# Patient Record
Sex: Male | Born: 1950
Health system: Southern US, Community
[De-identification: ages and names within clinical notes are randomized; demographics above are authoritative.]

## PROBLEM LIST (undated history)

## (undated) DIAGNOSIS — D75A Glucose-6-phosphate dehydrogenase (G6PD) deficiency without anemia: Secondary | ICD-10-CM

## (undated) DIAGNOSIS — N390 Urinary tract infection, site not specified: Secondary | ICD-10-CM

## (undated) DIAGNOSIS — I10 Essential (primary) hypertension: Secondary | ICD-10-CM

## (undated) DIAGNOSIS — E785 Hyperlipidemia, unspecified: Secondary | ICD-10-CM

## (undated) HISTORY — DX: Hyperlipidemia, unspecified: E78.5

## (undated) HISTORY — DX: Essential (primary) hypertension: I10

## (undated) HISTORY — DX: Urinary tract infection, site not specified: N39.0

## (undated) HISTORY — PX: URETHRA SURGERY: SHX824

---

## 2000-01-11 ENCOUNTER — Encounter: Payer: Self-pay | Admitting: Urology

## 2000-01-12 ENCOUNTER — Encounter (INDEPENDENT_AMBULATORY_CARE_PROVIDER_SITE_OTHER): Payer: Self-pay | Admitting: Specialist

## 2000-01-12 ENCOUNTER — Ambulatory Visit (HOSPITAL_COMMUNITY): Admission: RE | Admit: 2000-01-12 | Discharge: 2000-01-12 | Payer: Self-pay | Admitting: Urology

## 2005-04-19 ENCOUNTER — Ambulatory Visit: Payer: Self-pay | Admitting: Internal Medicine

## 2005-05-04 ENCOUNTER — Ambulatory Visit: Payer: Self-pay | Admitting: Internal Medicine

## 2009-02-03 ENCOUNTER — Encounter: Admission: RE | Admit: 2009-02-03 | Discharge: 2009-02-03 | Payer: Self-pay | Admitting: Family Medicine

## 2010-12-23 NOTE — Op Note (Signed)
[facility]  Patient:    Cameron Rice, Cameron Rice                     MRN: [id number] Proc. Date: 01/12/00 Adm. Date:  [id number] Disc. Date: [id number] Attending:  Katherine Roan CC:         Talmadge Coventry, M.D.                           Operative Report  PREOPERATIVE DIAGNOSIS:  Right testicular nodule.  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  OPERATION:  Right scrotal exploration of testis with biopsy.  ANESTHESIA:  General.  SURGEON: Rozanna Boer., M.D.  BRIEF HISTORY:  This 60 year old black male was found two days ago on routine physical to have a nodule of his right testis.  It is not painful or tender. Does not remember any history of injury.  This was confirmed in my office yesterday.  Ultrasound seemed to show the nodule, but the underlying testicular tissue seemed to be homogeneous and normal.  He is admitted now for scrotal exploration of this lesion.  Serum tumor markers were drawn preoperatively.  DESCRIPTION OF PROCEDURE:  The patient was placed on the operating table in supine position.  After satisfactory induction of general endotracheal anesthesia, was shaved, prepped and draped in the usual sterile fashion.  A right groin incision was made, carried down through the subcutaneous tissue to expose the external oblique fascia.  The fascia was opened at the external ring, taking care not to injure the ilioinguinal nerve.  A Penrose drain was placed around the cord, and the testis was delivered into the operative field. The tunica vaginalis was opened and the testis explored.  The nodule was about 2.5 cm and seemed to be on the tunica albuginea just near the lateral aspect of the midportion of the testis near the epididymis but arising from the tunica albuginea of the testis.  This nodule was excised.  It was sent for permanent section.  It was obviously not a tumor.   When hemostasis was achieved, the testis was placed back in the right hemiscrotal compartment with a pexing suture of 3-0 chromic.  The external oblique fascia was then closed with interrupted 2-0 silk pop-offs _____, and the skin with a running subcuticular closure with 4-0 Vicryl and Steri-Strips.  A sterile dry dressing was applied. He was given 30 mg of Toradol IV and sent to the recovery room in good condition.  Sponge, instrument, and needle counts were correct.  Blood loss was minimal.  The patient will be later discharged as an outpatient with detailed written instructions. DD:  01/12/00 TD:  01/16/00 Job: [id number]

## 2011-09-06 DIAGNOSIS — E7800 Pure hypercholesterolemia, unspecified: Secondary | ICD-10-CM | POA: Insufficient documentation

## 2011-09-06 DIAGNOSIS — N400 Enlarged prostate without lower urinary tract symptoms: Secondary | ICD-10-CM | POA: Insufficient documentation

## 2011-09-06 DIAGNOSIS — I10 Essential (primary) hypertension: Secondary | ICD-10-CM | POA: Insufficient documentation

## 2011-09-06 DIAGNOSIS — E291 Testicular hypofunction: Secondary | ICD-10-CM | POA: Insufficient documentation

## 2012-05-24 ENCOUNTER — Encounter: Payer: Self-pay | Admitting: Internal Medicine

## 2012-12-04 ENCOUNTER — Encounter: Payer: Self-pay | Admitting: Internal Medicine

## 2013-09-04 DIAGNOSIS — G4726 Circadian rhythm sleep disorder, shift work type: Secondary | ICD-10-CM | POA: Insufficient documentation

## 2015-11-30 DIAGNOSIS — Z23 Encounter for immunization: Secondary | ICD-10-CM | POA: Diagnosis not present

## 2015-11-30 DIAGNOSIS — N3000 Acute cystitis without hematuria: Secondary | ICD-10-CM | POA: Diagnosis not present

## 2015-11-30 DIAGNOSIS — R8299 Other abnormal findings in urine: Secondary | ICD-10-CM | POA: Diagnosis not present

## 2015-11-30 DIAGNOSIS — E785 Hyperlipidemia, unspecified: Secondary | ICD-10-CM | POA: Diagnosis not present

## 2015-11-30 DIAGNOSIS — I10 Essential (primary) hypertension: Secondary | ICD-10-CM | POA: Diagnosis not present

## 2015-11-30 DIAGNOSIS — Z0000 Encounter for general adult medical examination without abnormal findings: Secondary | ICD-10-CM | POA: Diagnosis not present

## 2016-02-09 DIAGNOSIS — D122 Benign neoplasm of ascending colon: Secondary | ICD-10-CM | POA: Diagnosis not present

## 2016-02-09 DIAGNOSIS — Z1211 Encounter for screening for malignant neoplasm of colon: Secondary | ICD-10-CM | POA: Diagnosis not present

## 2016-03-06 DIAGNOSIS — N400 Enlarged prostate without lower urinary tract symptoms: Secondary | ICD-10-CM | POA: Diagnosis not present

## 2016-05-29 DIAGNOSIS — I10 Essential (primary) hypertension: Secondary | ICD-10-CM | POA: Diagnosis not present

## 2016-05-29 DIAGNOSIS — E7800 Pure hypercholesterolemia, unspecified: Secondary | ICD-10-CM | POA: Diagnosis not present

## 2016-09-22 DIAGNOSIS — N400 Enlarged prostate without lower urinary tract symptoms: Secondary | ICD-10-CM | POA: Diagnosis not present

## 2016-09-22 DIAGNOSIS — I10 Essential (primary) hypertension: Secondary | ICD-10-CM | POA: Diagnosis not present

## 2016-11-27 DIAGNOSIS — Z0000 Encounter for general adult medical examination without abnormal findings: Secondary | ICD-10-CM | POA: Diagnosis not present

## 2016-11-27 DIAGNOSIS — Z1159 Encounter for screening for other viral diseases: Secondary | ICD-10-CM | POA: Diagnosis not present

## 2016-11-27 DIAGNOSIS — I10 Essential (primary) hypertension: Secondary | ICD-10-CM | POA: Diagnosis not present

## 2016-11-27 DIAGNOSIS — E7800 Pure hypercholesterolemia, unspecified: Secondary | ICD-10-CM | POA: Diagnosis not present

## 2016-12-21 DIAGNOSIS — I10 Essential (primary) hypertension: Secondary | ICD-10-CM | POA: Diagnosis not present

## 2016-12-21 DIAGNOSIS — S46211A Strain of muscle, fascia and tendon of other parts of biceps, right arm, initial encounter: Secondary | ICD-10-CM | POA: Diagnosis not present

## 2017-01-22 DIAGNOSIS — R300 Dysuria: Secondary | ICD-10-CM | POA: Diagnosis not present

## 2017-01-22 DIAGNOSIS — I10 Essential (primary) hypertension: Secondary | ICD-10-CM | POA: Diagnosis not present

## 2017-06-18 DIAGNOSIS — R350 Frequency of micturition: Secondary | ICD-10-CM | POA: Diagnosis not present

## 2017-06-18 DIAGNOSIS — R82998 Other abnormal findings in urine: Secondary | ICD-10-CM | POA: Diagnosis not present

## 2017-06-18 DIAGNOSIS — N390 Urinary tract infection, site not specified: Secondary | ICD-10-CM | POA: Diagnosis not present

## 2017-06-18 DIAGNOSIS — I10 Essential (primary) hypertension: Secondary | ICD-10-CM | POA: Diagnosis not present

## 2017-06-18 DIAGNOSIS — R319 Hematuria, unspecified: Secondary | ICD-10-CM | POA: Diagnosis not present

## 2017-06-18 DIAGNOSIS — E7800 Pure hypercholesterolemia, unspecified: Secondary | ICD-10-CM | POA: Diagnosis not present

## 2017-08-29 ENCOUNTER — Encounter: Payer: Self-pay | Admitting: Nurse Practitioner

## 2017-08-29 ENCOUNTER — Ambulatory Visit (INDEPENDENT_AMBULATORY_CARE_PROVIDER_SITE_OTHER): Payer: BLUE CROSS/BLUE SHIELD | Admitting: Nurse Practitioner

## 2017-08-29 VITALS — BP 138/84 | HR 82 | Temp 97.6°F | Ht 71.0 in | Wt 190.0 lb

## 2017-08-29 DIAGNOSIS — N529 Male erectile dysfunction, unspecified: Secondary | ICD-10-CM | POA: Diagnosis not present

## 2017-08-29 DIAGNOSIS — N401 Enlarged prostate with lower urinary tract symptoms: Secondary | ICD-10-CM

## 2017-08-29 DIAGNOSIS — N521 Erectile dysfunction due to diseases classified elsewhere: Secondary | ICD-10-CM | POA: Insufficient documentation

## 2017-08-29 DIAGNOSIS — N390 Urinary tract infection, site not specified: Secondary | ICD-10-CM | POA: Insufficient documentation

## 2017-08-29 DIAGNOSIS — I10 Essential (primary) hypertension: Secondary | ICD-10-CM

## 2017-08-29 DIAGNOSIS — R350 Frequency of micturition: Secondary | ICD-10-CM | POA: Diagnosis not present

## 2017-08-29 LAB — POCT URINALYSIS DIPSTICK
Bilirubin, UA: NEGATIVE
GLUCOSE UA: NEGATIVE
Ketones, UA: NEGATIVE
Nitrite, UA: POSITIVE
Protein, UA: NEGATIVE
RBC UA: NEGATIVE
Spec Grav, UA: 1.015 (ref 1.010–1.025)
Urobilinogen, UA: 0.2 E.U./dL
pH, UA: 7 (ref 5.0–8.0)

## 2017-08-29 MED ORDER — CIPROFLOXACIN HCL 500 MG PO TABS: 500.0000 mg | ORAL_TABLET | Freq: Two times a day (BID) | ORAL | 0 refills | Status: DC

## 2017-08-29 NOTE — Progress Notes (Signed)
Subjective:  Patient ID: Cameron Rice, male    DOB: 1950/08/12  Age: 67 y.o. MRN: [id number]  CC: Establish Care (est care) and Urinary Tract Infection (SPQ:[id number] urinate,strong odor/get this alot/had surgery for this but not helping as much/Dr. Kathyrn Lass from [facility]/see doc every 2-3 mo for abx)  Urinary Frequency   This is a recurrent problem. The current episode started more than 1 year ago (onset 52yrs ago). The problem occurs every urination. The problem has been waxing and waning. The quality of the pain is described as burning. There has been no fever. He is sexually active. There is no history of pyelonephritis. Associated symptoms include frequency, hesitancy and urgency. Pertinent negatives include no chills, discharge, flank pain, hematuria, nausea, possible pregnancy, sweats or vomiting. He has tried increased fluids for the symptoms. The treatment provided no relief. His past medical history is significant for recurrent UTIs, urinary stasis and a urological procedure. There is no history of catheterization, kidney stones or a single kidney.  cystocopy done 08/2015 Avoids  Soda, caffeine and artificial sweetners. Denies any constipation.  Last UTI treatment 06/2017, treated with bactrim x 10days. Urine culture was positive foe klebsiella pneumoniae.  Will like referral to another urology group. Last urologist was Dr. Kathyrn Lass who has now retired.  HTN: Controlled with Lotrel. Maintain DASH diet and regular exercise  Previous pcp: Dr. Lowry Bowl with [facility]. Last CPE 11/27/2016. Labs last done 06/2017: stable (reviewed CMP, lipid panel, PSA).  Outpatient Medications Prior to Visit  Medication Sig Dispense Refill  . amLODipine-benazepril (LOTREL) 10-20 MG capsule Take by mouth.    Marland Kitchen atorvastatin (LIPITOR) 40 MG tablet TAKE 1 TABLET DAILY    . Multiple Vitamin (MULTIVITAMIN) tablet Take by mouth.    . Multiple Vitamins-Minerals (ONE-A-DAY MENS 50+  ADVANTAGE) TABS     . oxybutynin (DITROPAN-XL) 5 MG 24 hr tablet TAKE 1 TABLET DAILY    . sildenafil (REVATIO) 20 MG tablet Take 3-5 tablets daily prn    . tamsulosin (FLOMAX) 0.4 MG CAPS capsule      No facility-administered medications prior to visit.     ROS See HPI  Objective:  BP 138/84   Pulse 82   Temp 97.6 F (36.4 C)   Ht 5\' 11"  (1.803 m)   Wt 190 lb (86.2 kg)   SpO2 95%   BMI 26.50 kg/m   BP Readings from Last 3 Encounters:  08/29/17 138/84    Wt Readings from Last 3 Encounters:  08/29/17 190 lb (86.2 kg)    Physical Exam  Constitutional: He is oriented to person, place, and time. No distress.  Cardiovascular: Normal rate, regular rhythm and normal heart sounds.  Pulmonary/Chest: Effort normal and breath sounds normal.  Abdominal: Soft. Bowel sounds are normal. He exhibits no distension. There is no tenderness.  Negative CVA tenderness  Musculoskeletal: He exhibits no edema.  Neurological: He is alert and oriented to person, place, and time.  Psychiatric: He has a normal mood and affect. His behavior is normal.  Vitals reviewed.   No results found for: WBC, HGB, HCT, PLT, GLUCOSE, CHOL, TRIG, HDL, LDLDIRECT, LDLCALC, ALT, AST, NA, K, CL, CREATININE, BUN, CO2, TSH, PSA, INR, GLUF, HGBA1C, MICROALBUR  Ct Pelvis W Contrast  Result Date: 02/03/2009 Clinical Data:  Right lower quadrant abdominal pain.  Question appendicitis.  CT ABDOMEN AND PELVIS WITH CONTRAST  Technique: Multidetector CT imaging of the abdomen and pelvis was performed following the standard protocol following the bolus administration of intravenous  contrast.  Contrast: 100 ml Omnipaque-300  Comparison: None  CT ABDOMEN  Findings:  Mild volume loss and atelectasis in the right lung base. Mild cardiomegaly.  Trace bilateral pleural effusions, greater right than left.  Normal liver, spleen.  The gastric antrum appears thick-walled. This could be due to underdistension on image 26.  Normal pancreas,  gallbladder, biliary tract, adrenal glands, left kidney.  There is moderate edema centered in the right perirenal space, slightly greater on the right than left.  An area of heterogeneity within the interpolar posterior right kidney on image 44 of series 4 and image 17 of series 5 measures approximately 2.0 cm.  There is no hydronephrosis.  No retroperitoneal or retrocrural adenopathy. Normal caliber colon. Normal terminal ileum.  The appendix is normal including on image 68.  Atypical position of small bowel loops in the right upper quadrant anterior to the hepatic flexure colon.  There is no evidence of obstruction or surrounding edema.  No ascites.  IMPRESSION:  1.  Moderate edema centered about the right kidney without hydronephrosis.  This is suspicious for pyelonephritis.  More focal area of heterogeneous attenuation interpolar right kidney could represent an infected renal lesion or even an area of focal infection.  Recommend correlation with urinalysis.  Consider short- term follow-up imaging if symptoms do not improve to exclude developing renal/perirenal abscess. Also consider follow-up imaging after the the patient's acute symptoms have improved to exclude an underlying lesion. 2.  Normal appearance of the appendix. 3.  Atypical position of right upper quadrant small bowel loops may be within normal variation.  Although an internal/transmesocolic hernia could have this appearance, absence of obstruction argues against clinical significance. 4.  Cardiomegaly with small bilateral pleural effusions.  CT PELVIS  Findings:  The right ureter is not dilated.  Tiny calcifications within the right hemi pelvis are felt to be vascular in nature. Normal pelvic bowel loops.  Tiny fat containing left inguinal hernia.  Mild urinary bladder wall thickening.  Mild prostatomegaly. No significant free fluid.  Multifactorial central canal stenosis involves the lumbar spine.  IMPRESSION:  1. No acute pelvic process. 2.  Mild  bladder wall thickening, likely secondary to a component of bladder outlet obstruction/prostatomegaly. Provider: Lorenda Cahill  Ct Abdomen W Contrast  Result Date: 02/03/2009 Clinical Data:  Right lower quadrant abdominal pain.  Question appendicitis.  CT ABDOMEN AND PELVIS WITH CONTRAST  Technique: Multidetector CT imaging of the abdomen and pelvis was performed following the standard protocol following the bolus administration of intravenous contrast.  Contrast: 100 ml Omnipaque-300  Comparison: None  CT ABDOMEN  Findings:  Mild volume loss and atelectasis in the right lung base. Mild cardiomegaly.  Trace bilateral pleural effusions, greater right than left.  Normal liver, spleen.  The gastric antrum appears thick-walled. This could be due to underdistension on image 26.  Normal pancreas, gallbladder, biliary tract, adrenal glands, left kidney.  There is moderate edema centered in the right perirenal space, slightly greater on the right than left.  An area of heterogeneity within the interpolar posterior right kidney on image 44 of series 4 and image 17 of series 5 measures approximately 2.0 cm.  There is no hydronephrosis.  No retroperitoneal or retrocrural adenopathy. Normal caliber colon. Normal terminal ileum.  The appendix is normal including on image 68.  Atypical position of small bowel loops in the right upper quadrant anterior to the hepatic flexure colon.  There is no evidence of obstruction or surrounding edema.  No ascites.  IMPRESSION:  1.  Moderate edema centered about the right kidney without hydronephrosis.  This is suspicious for pyelonephritis.  More focal area of heterogeneous attenuation interpolar right kidney could represent an infected renal lesion or even an area of focal infection.  Recommend correlation with urinalysis.  Consider short- term follow-up imaging if symptoms do not improve to exclude developing renal/perirenal abscess. Also consider follow-up imaging after the the patient's  acute symptoms have improved to exclude an underlying lesion. 2.  Normal appearance of the appendix. 3.  Atypical position of right upper quadrant small bowel loops may be within normal variation.  Although an internal/transmesocolic hernia could have this appearance, absence of obstruction argues against clinical significance. 4.  Cardiomegaly with small bilateral pleural effusions.  CT PELVIS  Findings:  The right ureter is not dilated.  Tiny calcifications within the right hemi pelvis are felt to be vascular in nature. Normal pelvic bowel loops.  Tiny fat containing left inguinal hernia.  Mild urinary bladder wall thickening.  Mild prostatomegaly. No significant free fluid.  Multifactorial central canal stenosis involves the lumbar spine.  IMPRESSION:  1. No acute pelvic process. 2.  Mild bladder wall thickening, likely secondary to a component of bladder outlet obstruction/prostatomegaly. Provider: Lorenda Cahill   Assessment & Plan:   Novak was seen today for establish care and urinary tract infection.  Diagnoses and all orders for this visit:  Recurrent UTI -     POCT urinalysis dipstick -     Ambulatory referral to Urology -     Urine Culture -     ciprofloxacin (CIPRO) 500 MG tablet; Take 1 tablet (500 mg total) by mouth 2 (two) times daily.  HTN (hypertension), benign  Benign prostatic hyperplasia with urinary frequency -     POCT urinalysis dipstick -     Ambulatory referral to Urology -     Urine Culture -     ciprofloxacin (CIPRO) 500 MG tablet; Take 1 tablet (500 mg total) by mouth 2 (two) times daily.  Erectile dysfunction, unspecified erectile dysfunction type -     Ambulatory referral to Urology   I am having Newt L. Rice start on ciprofloxacin. I am also having him maintain his amLODipine-benazepril, atorvastatin, oxybutynin, ONE-A-DAY MENS 50+ ADVANTAGE, sildenafil, tamsulosin, and multivitamin.  Meds ordered this encounter  Medications  . ciprofloxacin (CIPRO) 500  MG tablet    Sig: Take 1 tablet (500 mg total) by mouth 2 (two) times daily.    Dispense:  10 tablet    Refill:  0    Order Specific Question:   Supervising Provider    Answer:   Lucille Passy [[id number]]    Follow-up: Return in about 3 months (around 12/10/2017) for CPE (fasting).  Wilfred Lacy, NP

## 2017-08-29 NOTE — Patient Instructions (Addendum)
Please let me know if you need additional medication refills.  You will be called with urine culture results.  You will be contacted to schedule appt with new urologist.  Thank you choosing [facility] for your health needs.

## 2017-09-04 ENCOUNTER — Telehealth: Payer: Self-pay | Admitting: Nurse Practitioner

## 2017-09-04 ENCOUNTER — Encounter: Payer: Self-pay | Admitting: Nurse Practitioner

## 2017-09-04 DIAGNOSIS — N390 Urinary tract infection, site not specified: Secondary | ICD-10-CM

## 2017-09-04 MED ORDER — AMOXICILLIN-POT CLAVULANATE 875-125 MG PO TABS: 1.0000 | ORAL_TABLET | Freq: Two times a day (BID) | ORAL | 0 refills | Status: DC

## 2017-09-04 NOTE — Telephone Encounter (Signed)
Let me know what lab say about urine culture that has not resulted

## 2017-09-04 NOTE — Telephone Encounter (Signed)
Spoke with the pt, stating he still experiencing frequent urinate and odor,denied pain. Please advise.   Beverlee Nims is checking on the urine culture

## 2017-09-04 NOTE — Telephone Encounter (Signed)
Pt is aware of the urine culture result.

## 2017-09-04 NOTE — Telephone Encounter (Signed)
Copied from [place] [id number]. Topic: General - Other >> Sep 04, 2017  8:10 AM Yvette Rack wrote: Reason for CRM: patient calling stating that he was in last week and had a UTI and its no better would like for something else to be called into his pharmacy he also would lie his lab results

## 2017-09-17 DIAGNOSIS — N5201 Erectile dysfunction due to arterial insufficiency: Secondary | ICD-10-CM | POA: Diagnosis not present

## 2017-09-17 DIAGNOSIS — N3000 Acute cystitis without hematuria: Secondary | ICD-10-CM | POA: Diagnosis not present

## 2017-09-17 DIAGNOSIS — R350 Frequency of micturition: Secondary | ICD-10-CM | POA: Diagnosis not present

## 2017-09-17 DIAGNOSIS — N401 Enlarged prostate with lower urinary tract symptoms: Secondary | ICD-10-CM | POA: Diagnosis not present

## 2017-09-22 ENCOUNTER — Encounter: Payer: Self-pay | Admitting: Nurse Practitioner

## 2017-09-22 DIAGNOSIS — I10 Essential (primary) hypertension: Secondary | ICD-10-CM

## 2017-09-24 MED ORDER — AMLODIPINE BESY-BENAZEPRIL HCL 10-20 MG PO CAPS: 1.0000 | ORAL_CAPSULE | Freq: Every day | ORAL | 1 refills | Status: DC

## 2017-09-24 NOTE — Telephone Encounter (Signed)
Please advise, we never send this med in for the pt.

## 2017-10-02 DIAGNOSIS — N3000 Acute cystitis without hematuria: Secondary | ICD-10-CM | POA: Diagnosis not present

## 2017-10-02 DIAGNOSIS — N390 Urinary tract infection, site not specified: Secondary | ICD-10-CM | POA: Diagnosis not present

## 2017-10-02 DIAGNOSIS — N5201 Erectile dysfunction due to arterial insufficiency: Secondary | ICD-10-CM | POA: Diagnosis not present

## 2017-12-01 ENCOUNTER — Encounter (HOSPITAL_BASED_OUTPATIENT_CLINIC_OR_DEPARTMENT_OTHER): Payer: Self-pay | Admitting: Emergency Medicine

## 2017-12-01 ENCOUNTER — Emergency Department (HOSPITAL_BASED_OUTPATIENT_CLINIC_OR_DEPARTMENT_OTHER)
Admission: EM | Admit: 2017-12-01 | Discharge: 2017-12-01 | Disposition: A | Discharge status: Home / Self Care | Payer: BLUE CROSS/BLUE SHIELD | Attending: Emergency Medicine | Admitting: Emergency Medicine

## 2017-12-01 ENCOUNTER — Other Ambulatory Visit: Payer: Self-pay

## 2017-12-01 DIAGNOSIS — M549 Dorsalgia, unspecified: Secondary | ICD-10-CM | POA: Diagnosis not present

## 2017-12-01 DIAGNOSIS — I10 Essential (primary) hypertension: Secondary | ICD-10-CM | POA: Insufficient documentation

## 2017-12-01 DIAGNOSIS — S39012A Strain of muscle, fascia and tendon of lower back, initial encounter: Secondary | ICD-10-CM

## 2017-12-01 MED ORDER — CYCLOBENZAPRINE HCL 5 MG PO TABS
5.0000 mg | ORAL_TABLET | Freq: Once | ORAL | Status: AC
  Administered 2017-12-01: 5 mg via ORAL
  Filled 2017-12-01: qty 1

## 2017-12-01 MED ORDER — IBUPROFEN 800 MG PO TABS
800.0000 mg | ORAL_TABLET | Freq: Once | ORAL | Status: AC
  Administered 2017-12-01: 800 mg via ORAL
  Filled 2017-12-01: qty 1

## 2017-12-01 MED ORDER — CYCLOBENZAPRINE HCL 5 MG PO TABS: 5.0000 mg | ORAL_TABLET | Freq: Three times a day (TID) | ORAL | 0 refills | Status: DC | PRN

## 2017-12-01 MED ORDER — IBUPROFEN 800 MG PO TABS: 800.0000 mg | ORAL_TABLET | Freq: Three times a day (TID) | ORAL | 0 refills | Status: DC
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
## 2017-12-01 NOTE — Discharge Instructions (Signed)
Expect to be stiff and sore for several days.   Take motrin 800 mg every 6 hrs for 2 days.   Take flexeril as needed for spasms.   Rest for 2 days   See your doctor  Return to ER if you have worse back pain, headaches, vomiting, chest pain, abdominal pain, weakness

## 2017-12-28 ENCOUNTER — Ambulatory Visit (INDEPENDENT_AMBULATORY_CARE_PROVIDER_SITE_OTHER): Payer: BLUE CROSS/BLUE SHIELD | Admitting: Nurse Practitioner

## 2017-12-28 ENCOUNTER — Encounter: Payer: Self-pay | Admitting: Nurse Practitioner

## 2017-12-28 VITALS — BP 128/80 | HR 75 | Temp 97.7°F | Ht 71.0 in | Wt 180.0 lb

## 2017-12-28 DIAGNOSIS — E782 Mixed hyperlipidemia: Secondary | ICD-10-CM

## 2017-12-28 DIAGNOSIS — I10 Essential (primary) hypertension: Secondary | ICD-10-CM | POA: Diagnosis not present

## 2017-12-28 DIAGNOSIS — R350 Frequency of micturition: Secondary | ICD-10-CM

## 2017-12-28 DIAGNOSIS — N401 Enlarged prostate with lower urinary tract symptoms: Secondary | ICD-10-CM | POA: Diagnosis not present

## 2017-12-28 DIAGNOSIS — Z0000 Encounter for general adult medical examination without abnormal findings: Secondary | ICD-10-CM | POA: Diagnosis not present

## 2017-12-28 LAB — CBC
HCT: 40.6 % (ref 39.0–52.0)
Hemoglobin: 13.8 g/dL (ref 13.0–17.0)
MCHC: 34.1 g/dL (ref 30.0–36.0)
MCV: 93.2 fl (ref 78.0–100.0)
PLATELETS: 291 10*3/uL (ref 150.0–400.0)
RBC: 4.35 Mil/uL (ref 4.22–5.81)
RDW: 12.6 % (ref 11.5–15.5)
WBC: 9.3 10*3/uL (ref 4.0–10.5)

## 2017-12-28 LAB — LIPID PANEL
CHOLESTEROL: 150 mg/dL (ref 0–200)
HDL: 35.6 mg/dL — ABNORMAL LOW (ref >39.00)
LDL CALC: 90 mg/dL (ref 0–99)
NonHDL: 114.65
Total CHOL/HDL Ratio: 4
Triglycerides: 122 mg/dL (ref 0.0–149.0)
VLDL: 24.4 mg/dL (ref 0.0–40.0)

## 2017-12-28 LAB — COMPREHENSIVE METABOLIC PANEL
ALBUMIN: 4 g/dL (ref 3.5–5.2)
ALK PHOS: 104 U/L (ref 39–117)
ALT: 23 U/L (ref 0–53)
AST: 22 U/L (ref 0–37)
BILIRUBIN TOTAL: 0.6 mg/dL (ref 0.2–1.2)
BUN: 15 mg/dL (ref 6–23)
CALCIUM: 9.3 mg/dL (ref 8.4–10.5)
CO2: 26 mEq/L (ref 19–32)
CREATININE: 0.66 mg/dL (ref 0.40–1.50)
Chloride: 105 mEq/L (ref 96–112)
GFR: 155.77 mL/min (ref >60.00)
Glucose, Bld: 103 mg/dL — ABNORMAL HIGH (ref 70–99)
Potassium: 3.6 mEq/L (ref 3.5–5.1)
SODIUM: 140 meq/L (ref 135–145)
TOTAL PROTEIN: 7.4 g/dL (ref 6.0–8.3)

## 2017-12-28 LAB — TSH: TSH: 1.26 u[IU]/mL (ref 0.35–4.50)

## 2017-12-28 LAB — PSA: PSA: 0.46 ng/mL (ref 0.10–4.00)

## 2017-12-28 MED ORDER — ATORVASTATIN CALCIUM 40 MG PO TABS: 40.0000 mg | ORAL_TABLET | Freq: Every day | ORAL | 1 refills | Status: DC

## 2017-12-28 MED ORDER — AMLODIPINE BESY-BENAZEPRIL HCL 10-20 MG PO CAPS: 1.0000 | ORAL_CAPSULE | Freq: Every day | ORAL | 1 refills | Status: DC

## 2017-12-28 NOTE — Progress Notes (Signed)
Subjective:    Patient ID: Cameron Rice, male    DOB: [birth year], 67 y.o.   MRN: [id number]  Patient presents today for complete physical  HPI  denies any acute complains.   BPH and recurrent UTI: Stable. Evaluated by [facility] 09/2017. Denies any urinary symptoms at this time.  HTN: Controlled with Lotrel. BP Readings from Last 3 Encounters:  12/28/17 128/80  12/01/17 139/90  08/29/17 138/84   Hyperlipidemia: No adverse effects with atorvastatin.  Immunizations: (TDAP, Hep C screen, Pneumovax, Influenza, zoster)  Health Maintenance  Topic Date Due  . Colon Cancer Screening  12/15/2002  . Pneumonia vaccines (1 of 2 - PCV13) 12/14/2017  . HIV Screening  12/29/2018*  . Flu Shot  03/07/2018  . Tetanus Vaccine  11/29/2025  .  Hepatitis C: One time screening is recommended by Center for Disease Control  (CDC) for  adults born from [birth year] through 1965.   Completed  *Topic was postponed. The date shown is not the original due date.   Diet:DASH diet.  Weight:  Wt Readings from Last 3 Encounters:  12/28/17 180 lb (81.6 kg)  12/01/17 185 lb (83.9 kg)  08/29/17 190 lb (86.2 kg)   Exercise:walking daily.  Fall Risk: Fall Risk  12/28/2017 08/29/2017  Falls in the past year? No No   Home Safety:home with wife.  Depression/Suicide: Depression screen [facility] 2/9 12/28/2017 08/29/2017  Decreased Interest 0 0  Down, Depressed, Hopeless 0 0  PHQ - 2 Score 0 0   Vision:needed, will schedule.  Dental:up to date, upper dentures.  Advanced Directive: Advanced Directives 12/01/2017  Does Patient Have a Medical Advance Directive? No   Medications and allergies reviewed with patient and updated if appropriate.  Patient Active Problem List   Diagnosis Date Noted  . HTN (hypertension), benign 08/29/2017  . Recurrent UTI 08/29/2017  . BPH (benign prostatic hyperplasia) 08/29/2017  . ED (erectile dysfunction) 08/29/2017    Current Outpatient Medications on File Prior to  Visit  Medication Sig Dispense Refill  . Multiple Vitamins-Minerals (ONE-A-DAY MENS 50+ ADVANTAGE) TABS     . oxybutynin (DITROPAN-XL) 5 MG 24 hr tablet TAKE 1 TABLET DAILY    . sildenafil (REVATIO) 20 MG tablet Take 3-5 tablets daily prn    . tamsulosin (FLOMAX) 0.4 MG CAPS capsule     . Multiple Vitamin (MULTIVITAMIN) tablet Take by mouth.     No current facility-administered medications on file prior to visit.     Past Medical History:  Diagnosis Date  . Hyperlipidemia   . Hypertension   . UTI (urinary tract infection)     Past Surgical History:  Procedure Laterality Date  . URETHRA SURGERY     went in clean make sure urethra is clear    Social History   Socioeconomic History  . Marital status: Married    Spouse name: Not on file  . Number of children: Not on file  . Years of education: Not on file  . Highest education level: Not on file  Occupational History  . Not on file  Social Needs  . Financial resource strain: Not on file  . Food insecurity:    Worry: Not on file    Inability: Not on file  . Transportation needs:    Medical: Not on file    Non-medical: Not on file  Tobacco Use  . Smoking status: Never Smoker  . Smokeless tobacco: Never Used  Substance and Sexual Activity  . Alcohol use: No  Frequency: Never  . Drug use: No  . Sexual activity: Not on file  Lifestyle  . Physical activity:    Days per week: Not on file    Minutes per session: Not on file  . Stress: Not on file  Relationships  . Social connections:    Talks on phone: Not on file    Gets together: Not on file    Attends religious service: Not on file    Active member of club or organization: Not on file    Attends meetings of clubs or organizations: Not on file    Relationship status: Not on file  Other Topics Concern  . Not on file  Social History Narrative  . Not on file    Family History  Problem Relation Age of Onset  . Cancer Mother   . Cancer Brother        liver  cancer  . Cancer Maternal Aunt        lung        Review of Systems  Constitutional: Negative for fever, malaise/fatigue and weight loss.  HENT: Negative for congestion and sore throat.   Eyes:       Negative for visual changes  Respiratory: Negative for cough and shortness of breath.   Cardiovascular: Negative for chest pain, palpitations and leg swelling.  Gastrointestinal: Negative for blood in stool, constipation, diarrhea and heartburn.  Genitourinary: Negative for dysuria, frequency and urgency.  Musculoskeletal: Negative for falls, joint pain and myalgias.  Skin: Negative for rash.  Neurological: Negative for dizziness, sensory change and headaches.  Endo/Heme/Allergies: Does not bruise/bleed easily.  Psychiatric/Behavioral: Negative for depression, substance abuse and suicidal ideas. The patient is not nervous/anxious.     Objective:   Vitals:   12/28/17 0809  BP: 128/80  Pulse: 75  Temp: 97.7 F (36.5 C)  SpO2: 94%    Body mass index is 25.1 kg/m.   Physical Examination:  Physical Exam  Constitutional: He is oriented to person, place, and time. He appears well-developed. No distress.  HENT:  Right Ear: External ear normal.  Left Ear: External ear normal.  Nose: Nose normal.  Mouth/Throat: Oropharynx is clear and moist. No oropharyngeal exudate.  Eyes: Pupils are equal, round, and reactive to light. Conjunctivae and EOM are normal.  Neck: Normal range of motion. Neck supple. No thyromegaly present.  Cardiovascular: Normal rate, regular rhythm, normal heart sounds and intact distal pulses.  Pulmonary/Chest: Effort normal and breath sounds normal. No respiratory distress. He exhibits no tenderness.  Abdominal: Soft. Bowel sounds are normal. He exhibits no distension. There is no tenderness.  Genitourinary:  Genitourinary Comments: Deferred to Urology by patient  Musculoskeletal: Normal range of motion. He exhibits no edema.  Lymphadenopathy:    He has no  cervical adenopathy.  Neurological: He is alert and oriented to person, place, and time. He has normal reflexes.  Skin: Skin is warm and dry.  Psychiatric: He has a normal mood and affect. His behavior is normal. Judgment and thought content normal.  Vitals reviewed.   ASSESSMENT and PLAN:  Keiyon was seen today for annual exam.  Diagnoses and all orders for this visit:  Encounter for preventative adult health care examination -     CBC -     TSH -     Comprehensive metabolic panel  HTN (hypertension), benign -     amLODipine-benazepril (LOTREL) 10-20 MG capsule; Take 1 capsule by mouth daily.  Benign prostatic hyperplasia with urinary frequency -  PSA  Mixed hyperlipidemia -     Lipid panel -     atorvastatin (LIPITOR) 40 MG tablet; Take 1 tablet (40 mg total) by mouth daily.   No problem-specific Assessment & Plan notes found for this encounter.     Follow up: Return in about 6 months (around 06/30/2018) for HTN, hyperlipidemia.  Wilfred Lacy, NP

## 2017-12-28 NOTE — Patient Instructions (Addendum)
Please sign medical release to get colonoscopy report from [facility].  Try to get eye exam every 1-2years.  Stable lab results  Health Maintenance, Male A healthy lifestyle and preventive care is important for your health and wellness. Ask your health care provider about what schedule of regular examinations is right for you. What should I know about weight and diet? Eat a Healthy Diet  Eat plenty of vegetables, fruits, whole grains, low-fat dairy products, and lean protein.  Do not eat a lot of foods high in solid fats, added sugars, or salt.  Maintain a Healthy Weight Regular exercise can help you achieve or maintain a healthy weight. You should:  Do at least 150 minutes of exercise each week. The exercise should increase your heart rate and make you sweat (moderate-intensity exercise).  Do strength-training exercises at least twice a week.  Watch Your Levels of Cholesterol and Blood Lipids  Have your blood tested for lipids and cholesterol every 5 years starting at 65 years of age. If you are at high risk for heart disease, you should start having your blood tested when you are 65 years old. You may need to have your cholesterol levels checked more often if: ? Your lipid or cholesterol levels are high. ? You are older than 65 years of age. ? You are at high risk for heart disease.  What should I know about cancer screening? Many types of cancers can be detected early and may often be prevented. Lung Cancer  You should be screened every year for lung cancer if: ? You are a current smoker who has smoked for at least 30 years. ? You are a former smoker who has quit within the past 15 years.  Talk to your health care provider about your screening options, when you should start screening, and how often you should be screened.  Colorectal Cancer  Routine colorectal cancer screening usually begins at 65 years of age and should be repeated every 5-10 years until you are 65  years old. You may need to be screened more often if early forms of precancerous polyps or small growths are found. Your health care provider may recommend screening at an earlier age if you have risk factors for colon cancer.  Your health care provider may recommend using home test kits to check for hidden blood in the stool.  A small camera at the end of a tube can be used to examine your colon (sigmoidoscopy or colonoscopy). This checks for the earliest forms of colorectal cancer.  Prostate and Testicular Cancer  Depending on your age and overall health, your health care provider may do certain tests to screen for prostate and testicular cancer.  Talk to your health care provider about any symptoms or concerns you have about testicular or prostate cancer.  Skin Cancer  Check your skin from head to toe regularly.  Tell your health care provider about any new moles or changes in moles, especially if: ? There is a change in a mole's size, shape, or color. ? You have a mole that is larger than a pencil eraser.  Always use sunscreen. Apply sunscreen liberally and repeat throughout the day.  Protect yourself by wearing long sleeves, pants, a wide-brimmed hat, and sunglasses when outside.  What should I know about heart disease, diabetes, and high blood pressure?  If you are 59-63 years of age, have your blood pressure checked every 3-5 years. If you are 79 years of age or older, have your  blood pressure checked every year. You should have your blood pressure measured twice-once when you are at a hospital or clinic, and once when you are not at a hospital or clinic. Record the average of the two measurements. To check your blood pressure when you are not at a hospital or clinic, you can use: ? An automated blood pressure machine at a pharmacy. ? A home blood pressure monitor.  Talk to your health care provider about your target blood pressure.  If you are between 20-69 years old, ask your  health care provider if you should take aspirin to prevent heart disease.  Have regular diabetes screenings by checking your fasting blood sugar level. ? If you are at a normal weight and have a low risk for diabetes, have this test once every three years after the age of 29. ? If you are overweight and have a high risk for diabetes, consider being tested at a younger age or more often.  A one-time screening for abdominal aortic aneurysm (AAA) by ultrasound is recommended for men aged 43-75 years who are current or former smokers. What should I know about preventing infection? Hepatitis B If you have a higher risk for hepatitis B, you should be screened for this virus. Talk with your health care provider to find out if you are at risk for hepatitis B infection. Hepatitis C Blood testing is recommended for:  Everyone born from [birth year] through 1963.  Anyone with known risk factors for hepatitis C.  Sexually Transmitted Diseases (STDs)  You should be screened each year for STDs including gonorrhea and chlamydia if: ? You are sexually active and are younger than 65 years of age. ? You are older than 65 years of age and your health care provider tells you that you are at risk for this type of infection. ? Your sexual activity has changed since you were last screened and you are at an increased risk for chlamydia or gonorrhea. Ask your health care provider if you are at risk.  Talk with your health care provider about whether you are at high risk of being infected with HIV. Your health care provider may recommend a prescription medicine to help prevent HIV infection.  What else can I do?  Schedule regular health, dental, and eye exams.  Stay current with your vaccines (immunizations).  Do not use any tobacco products, such as cigarettes, chewing tobacco, and e-cigarettes. If you need help quitting, ask your health care provider.  Limit alcohol intake to no more than 2 drinks per day. One  drink equals 12 ounces of beer, 5 ounces of wine, or 1 ounces of hard liquor.  Do not use street drugs.  Do not share needles.  Ask your health care provider for help if you need support or information about quitting drugs.  Tell your health care provider if you often feel depressed.  Tell your health care provider if you have ever been abused or do not feel safe at home. This information is not intended to replace advice given to you by your health care provider. Make sure you discuss any questions you have with your health care provider. Document Released: 01/20/2008 Document Revised: 03/22/2016 Document Reviewed: 04/27/2015 Elsevier Interactive Patient Education  [employer].

## 2017-12-31 ENCOUNTER — Encounter: Payer: Self-pay | Admitting: Nurse Practitioner

## 2018-06-05 ENCOUNTER — Ambulatory Visit (INDEPENDENT_AMBULATORY_CARE_PROVIDER_SITE_OTHER): Payer: BLUE CROSS/BLUE SHIELD | Admitting: Emergency Medicine

## 2018-06-05 DIAGNOSIS — Z23 Encounter for immunization: Secondary | ICD-10-CM

## 2018-06-05 NOTE — Progress Notes (Signed)
Patient arrived today to receive the high dose flu vaccine and Prevnar 13. Patient received the flu vaccine in the left deltoid and the Prevnar 13 in the right deltoid with no problem. Patient tolerated it well.

## 2018-06-26 ENCOUNTER — Other Ambulatory Visit: Payer: Self-pay | Admitting: Nurse Practitioner

## 2018-06-26 DIAGNOSIS — E782 Mixed hyperlipidemia: Secondary | ICD-10-CM

## 2018-08-14 DIAGNOSIS — N400 Enlarged prostate without lower urinary tract symptoms: Secondary | ICD-10-CM | POA: Diagnosis not present

## 2018-08-14 DIAGNOSIS — N529 Male erectile dysfunction, unspecified: Secondary | ICD-10-CM | POA: Diagnosis not present

## 2018-09-06 ENCOUNTER — Ambulatory Visit (INDEPENDENT_AMBULATORY_CARE_PROVIDER_SITE_OTHER): Payer: BLUE CROSS/BLUE SHIELD | Admitting: Nurse Practitioner

## 2018-09-06 ENCOUNTER — Other Ambulatory Visit: Payer: Self-pay

## 2018-09-06 ENCOUNTER — Telehealth: Payer: Self-pay

## 2018-09-06 ENCOUNTER — Encounter: Payer: Self-pay | Admitting: Nurse Practitioner

## 2018-09-06 VITALS — BP 140/92 | HR 76 | Temp 97.6°F | Ht 71.0 in | Wt 188.2 lb

## 2018-09-06 DIAGNOSIS — I10 Essential (primary) hypertension: Secondary | ICD-10-CM

## 2018-09-06 DIAGNOSIS — G8929 Other chronic pain: Secondary | ICD-10-CM | POA: Diagnosis not present

## 2018-09-06 DIAGNOSIS — M25511 Pain in right shoulder: Secondary | ICD-10-CM | POA: Diagnosis not present

## 2018-09-06 LAB — BASIC METABOLIC PANEL
BUN: 18 mg/dL (ref 6–23)
CALCIUM: 9.2 mg/dL (ref 8.4–10.5)
CO2: 26 mEq/L (ref 19–32)
CREATININE: 0.77 mg/dL (ref 0.40–1.50)
Chloride: 106 mEq/L (ref 96–112)
GFR: 122.41 mL/min (ref >60.00)
GLUCOSE: 99 mg/dL (ref 70–99)
Potassium: 4 mEq/L (ref 3.5–5.1)
Sodium: 138 mEq/L (ref 135–145)

## 2018-09-06 MED ORDER — PREDNISONE 10 MG PO TABS: ORAL_TABLET | ORAL | 0 refills | Status: AC

## 2018-09-06 MED ORDER — AMLODIPINE BESY-BENAZEPRIL HCL 10-20 MG PO CAPS: 1.0000 | ORAL_CAPSULE | Freq: Every day | ORAL | 3 refills | Status: DC

## 2018-09-06 NOTE — Progress Notes (Signed)
Subjective:  Patient ID: Cameron Rice, male    DOB: August 20, 1950  Age: 68 y.o. MRN: [id number]  CC: Pain (right pain in shoulder, went to the dr 2 years ago-- ([facility])/ given pain meds got better/ last couple months got worse maybe from lifting weights/ constant pain, using ibuprofen)   Shoulder Pain   The pain is present in the right shoulder. This is a chronic problem. The current episode started more than 1 year ago. There has been no history of extremity trauma. The problem occurs intermittently. The problem has been waxing and waning. The quality of the pain is described as aching and dull. The pain is moderate. Pertinent negatives include no fever, inability to bear weight, itching, joint locking, joint swelling, limited range of motion, numbness, stiffness or tingling. The symptoms are aggravated by lying down and activity. He has tried nothing for the symptoms. Family history does not include gout or rheumatoid arthritis. There is no history of diabetes, gout, osteoarthritis or rheumatoid arthritis.   Right shoulder pain: onset 29yrs ago Waxing and waning Worse in last 41months with increase exercise (weight lifting, 40lbs dumb bells). Worse with laying supine and on right side. Works as a Dealer. No muscle weakness. No limited ROM.  HTN: Mild elevation today. Compliant with medication. Needs refill BP Readings from Last 3 Encounters:  09/06/18 (!) 140/92  12/28/17 128/80  12/01/17 139/90   Reviewed past Medical, Social and Family history today.  Outpatient Medications Prior to Visit  Medication Sig Dispense Refill  . atorvastatin (LIPITOR) 40 MG tablet TAKE 1 TABLET DAILY 90 tablet 1  . Multiple Vitamin (MULTIVITAMIN) tablet Take by mouth.    . Multiple Vitamins-Minerals (ONE-A-DAY MENS 50+ ADVANTAGE) TABS     . oxybutynin (DITROPAN-XL) 5 MG 24 hr tablet TAKE 1 TABLET DAILY    . sildenafil (REVATIO) 20 MG tablet Take 3-5 tablets daily prn    . tamsulosin (FLOMAX) 0.4  MG CAPS capsule     . amLODipine-benazepril (LOTREL) 10-20 MG capsule Take 1 capsule by mouth daily. 90 capsule 1   No facility-administered medications prior to visit.     ROS See HPI  Objective:  BP (!) 140/92   Pulse 76   Temp 97.6 F (36.4 C) (Oral)   Ht 5\' 11"  (1.803 m)   Wt 188 lb 3.2 oz (85.4 kg)   SpO2 95%   BMI 26.25 kg/m   BP Readings from Last 3 Encounters:  09/06/18 (!) 140/92  12/28/17 128/80  12/01/17 139/90    Wt Readings from Last 3 Encounters:  09/06/18 188 lb 3.2 oz (85.4 kg)  12/28/17 180 lb (81.6 kg)  12/01/17 185 lb (83.9 kg)    Physical Exam Neck:     Musculoskeletal: Normal range of motion and neck supple.  Cardiovascular:     Rate and Rhythm: Normal rate.  Pulmonary:     Effort: Pulmonary effort is normal.  Musculoskeletal:        General: No swelling, tenderness or deformity.     Right shoulder: Normal.     Right elbow: Normal.    Right wrist: Normal.     Cervical back: Normal.     Right upper arm: Normal.     Right forearm: Normal.     Right hand: Normal.     Right lower leg: No edema.     Left lower leg: No edema.  Lymphadenopathy:     Cervical: No cervical adenopathy.  Skin:    Findings: No erythema.  Neurological:     Mental Status: He is alert and oriented to person, place, and time.     Lab Results  Component Value Date   WBC 9.3 12/28/2017   HGB 13.8 12/28/2017   HCT 40.6 12/28/2017   PLT 291.0 12/28/2017   GLUCOSE 99 09/06/2018   CHOL 150 12/28/2017   TRIG 122.0 12/28/2017   HDL 35.60 (L) 12/28/2017   LDLCALC 90 12/28/2017   ALT 23 12/28/2017   AST 22 12/28/2017   NA 138 09/06/2018   K 4.0 09/06/2018   CL 106 09/06/2018   CREATININE 0.77 09/06/2018   BUN 18 09/06/2018   CO2 26 09/06/2018   TSH 1.26 12/28/2017   PSA 0.46 12/28/2017    No results found.  Assessment & Plan:   Nicolas was seen today for pain.  Diagnoses and all orders for this visit:  Chronic right shoulder pain -     predniSONE  (DELTASONE) 10 MG tablet; Take 5 tablets (50 mg total) by mouth daily with breakfast for 1 day, THEN 4 tablets (40 mg total) daily with breakfast for 2 days, THEN 3 tablets (30 mg total) daily with breakfast for 3 days, THEN 2 tablets (20 mg total) daily with breakfast for 3 days, THEN 1 tablet (10 mg total) daily with breakfast for 4 days, THEN 0.5 tablets (5 mg total) daily with breakfast for 2 days.  HTN (hypertension), benign -     Basic metabolic panel -     amLODipine-benazepril (LOTREL) 10-20 MG capsule; Take 1 capsule by mouth daily.   I am having Enis L. Rice start on predniSONE. I am also having him maintain his oxybutynin, ONE-A-DAY MENS 50+ ADVANTAGE, sildenafil, tamsulosin, multivitamin, atorvastatin, and amLODipine-benazepril.  Meds ordered this encounter  Medications  . predniSONE (DELTASONE) 10 MG tablet    Sig: Take 5 tablets (50 mg total) by mouth daily with breakfast for 1 day, THEN 4 tablets (40 mg total) daily with breakfast for 2 days, THEN 3 tablets (30 mg total) daily with breakfast for 3 days, THEN 2 tablets (20 mg total) daily with breakfast for 3 days, THEN 1 tablet (10 mg total) daily with breakfast for 4 days, THEN 0.5 tablets (5 mg total) daily with breakfast for 2 days.    Dispense:  33 tablet    Refill:  0    Order Specific Question:   Supervising Provider    Answer:   MATTHEWS, CODY [[id number]]  . amLODipine-benazepril (LOTREL) 10-20 MG capsule    Sig: Take 1 capsule by mouth daily.    Dispense:  90 capsule    Refill:  3    Order Specific Question:   Supervising Provider    Answer:   MATTHEWS, CODY [[id number]]    Problem List Items Addressed This Visit      Cardiovascular and Mediastinum   HTN (hypertension), benign   Relevant Medications   amLODipine-benazepril (LOTREL) 10-20 MG capsule   Other Relevant Orders   Basic metabolic panel (Completed)    Other Visit Diagnoses    Chronic right shoulder pain    -  Primary   Relevant Medications   predniSONE  (DELTASONE) 10 MG tablet      Follow-up: Return in about 4 months (around 12/30/2018) for CPE (fasting).  Wilfred Lacy, NP

## 2018-09-06 NOTE — Telephone Encounter (Signed)
-----   Message from Flossie Buffy, NP sent at 09/06/2018  2:05 PM EST ----- Normal BP medication sent

## 2018-09-06 NOTE — Telephone Encounter (Signed)
Copied from [place] [phone]. Topic: General - Other >> Sep 06, 2018  9:35 AM Leward Quan A wrote: Reason for CRM: [facility] pharmacy called to say that Rx for Prednisone sent in this morning came in as a fax and not an E script. Requesting a new Rx sent by E scribe please. >> Sep 06, 2018  9:55 AM Ahmed Prima L wrote: [facility] Is calling back requesting a nurse to call back and verify that the paper script that was sent over for prednisone really came from the doctor and not the patient because she is saying that it is only signed by the doctor and it could have came from any [facility] fax machine. Please Advise. She is not going to fill this yet.

## 2018-09-06 NOTE — Patient Instructions (Signed)
Go to lab for blood draw. Do shoulder exercise once a day. Apply cold compress after exercise (59mins) Avoid lifting weights. If no improvement in 6weeks, will need referral to sports medicine.  Shoulder Range of Motion Exercises Shoulder range of motion (ROM) exercises are done to keep the shoulder moving freely or to increase movement. They are often recommended for people who have shoulder pain or stiffness or who are recovering from a shoulder surgery. Phase 1 exercises When you are able, do this exercise 1-2 times per day for 30-60 seconds in each direction, or as directed by your health care provider. Pendulum exercise To do this exercise while sitting: 1. Sit in a chair or at the edge of your bed with your feet flat on the floor. 2. Let your affected arm hang down in front of you over the edge of the bed or chair. 3. Relax your shoulder, arm, and hand. [place] your body so your arm gently swings in small circles. You can also use your unaffected arm to start the motion. 5. Repeat changing the direction of the circles, swinging your arm left and right, and swinging your arm forward and back. To do this exercise while standing: 1. Stand next to a sturdy chair or table, and hold on to it with your hand on your unaffected side. 2. Bend forward at the waist. 3. Bend your knees slightly. 4. Relax your shoulder, arm, and hand. 5. While keeping your shoulder relaxed, use body motion to swing your arm in small circles. 6. Repeat changing the direction of the circles, swinging your arm left and right, and swinging your arm forward and back. 7. Between exercises, stand up tall and take a short break to relax your lower back.  Phase 2 exercises Do these exercises 1-2 times per day or as told by your health care provider. Hold each stretch for 30 seconds, and repeat 3 times. Do the exercises with one or both arms as instructed by your health care provider. For these exercises, sit at a table  with your hand and arm supported by the table. A chair that slides easily or has wheels can be helpful. External rotation 1. Turn your chair so that your affected side is nearest to the table. 2. Place your forearm on the table to your side. Bend your elbow about 90 at the elbow (right angle) and place your hand palm facing down on the table. Your elbow should be about 6 inches away from your side. 3. Keeping your arm on the table, lean your body forward. Abduction 1. Turn your chair so that your affected side is nearest to the table. 2. Place your forearm and hand on the table so that your thumb points toward the ceiling and your arm is straight out to your side. 3. Slide your hand out to the side and away from you, using your unaffected arm to do the work. 4. To increase the stretch, you can slide your chair away from the table. Flexion: forward stretch 1. Sit facing the table. Place your hand and elbow on the table in front of you. 2. Slide your hand forward and away from you, using your unaffected arm to do the work. 3. To increase the stretch, you can slide your chair backward. Phase 3 exercises Do these exercises 1-2 times per day or as told by your health care provider. Hold each stretch for 30 seconds, and repeat 3 times. Do the exercises with one or both arms as instructed  by your health care provider. Cross-body stretch: posterior capsule stretch 1. Lift your arm straight out in front of you. 2. Bend your arm 90 at the elbow (right angle) so your forearm moves across your body. 3. Use your other arm to gently pull the elbow across your body, toward your other shoulder. Wall climbs 1. Stand with your affected arm extended out to the side with your hand resting on a door frame. 2. Slide your hand slowly up the door frame. 3. To increase the stretch, step through the door frame. Keep your body upright and do not lean. Wand exercises You will need a cane, a piece of PVC pipe, or a  sturdy wooden dowel for wand exercises. Flexion To do this exercise while standing: 1. Hold the wand with both of your hands, palms down. 2. Using the other arm to help, lift your arms up and over your head, if able. 3. Push upward with your other arm to gently increase the stretch. To do this exercise while lying down: 1. Lie on your back with your elbows resting on the floor and the wand in both your hands. Your hands will be palm down, or pointing toward your feet. 2. Lift your hands toward the ceiling, using your unaffected arm to help if needed. 3. Bring your arms overhead as able, using your unaffected arm to help if needed. Internal rotation 1. Stand while holding the wand behind you with both hands. Your unaffected arm should be extended above your head with the arm of the affected side extended behind you at the level of your waist. The wand should be pointing straight up and down as you hold it. 2. Slowly pull the wand up behind your back by straightening the elbow of your unaffected arm and bending the elbow of your affected arm. External rotation 1. Lie on your back with your affected upper arm supported on a small pillow or rolled towel. When you first do this exercise, keep your upper arm close to your body. Over time, bring your arm up to a 90 angle out to the side. 2. Hold the wand across your stomach and with both hands palm up. Your elbow on your affected side should be bent at a 90 angle. 3. Use your unaffected side to help push your forearm away from you and toward the floor. Keep your elbow on your affected side bent at a 90 angle. Contact a health care provider if you have:  New or increasing pain.  New numbness, tingling, weakness, or discoloration in your arm or hand. This information is not intended to replace advice given to you by your health care provider. Make sure you discuss any questions you have with your health care provider. Document Released: 04/22/2003  Document Revised: 09/05/2017 Document Reviewed: 09/05/2017 Elsevier Interactive Patient Education  2019 [employer].

## 2018-09-06 NOTE — Telephone Encounter (Signed)
Called pharmacy and verified that Wilfred Lacy sent the Rx fax.

## 2018-11-21 ENCOUNTER — Encounter: Payer: Self-pay | Admitting: Nurse Practitioner

## 2018-11-22 ENCOUNTER — Telehealth: Payer: Self-pay

## 2018-11-22 NOTE — Telephone Encounter (Signed)
Questions for Screening COVID-19  Symptom onset: n/a  Travel or Contacts: no travel During this illness, did/does the patient experience any of the following symptoms? Fever >100.5F []   Yes [x]   No []   Unknown Subjective fever (felt feverish) []   Yes [x]   No []   Unknown Chills []   Yes [x]   No []   Unknown Muscle aches (myalgia) []   Yes [x]   No []   Unknown Runny nose (rhinorrhea) []   Yes [x]   No []   Unknown Sore throat []   Yes []   No []   Unknown Cough (new onset or worsening of chronic cough) []   Yes [x]   No []   Unknown Shortness of breath (dyspnea) []   Yes [x]   No []   Unknown Nausea or vomiting []   Yes [x]   No []   Unknown Headache []   Yes [x]   No []   Unknown Abdominal pain  []   Yes [x]   No []   Unknown Diarrhea (?3 loose/looser than normal stools/24hr period) []   Yes [x]   No []   Unknown Other, specify:  Patient risk factors: Smoker? []   Current []   Former [x]   Never If male, currently pregnant? []   Yes [x]   No  Patient Active Problem List   Diagnosis Date Noted  . Need for prophylactic vaccination against Streptococcus pneumoniae (pneumococcus) and influenza 06/05/2018  . HTN (hypertension), benign 08/29/2017  . Recurrent UTI 08/29/2017  . BPH (benign prostatic hyperplasia) 08/29/2017  . ED (erectile dysfunction) 08/29/2017  . Shift work sleep disorder 09/04/2013  . Pure hypercholesterolemia 09/06/2011  . Hypogonadism male 09/06/2011    Plan:  []   High risk for COVID-19 with red flags go to ED (with CP, SOB, weak/lightheaded, or fever > 101.5). Call ahead.  []   High risk for COVID-19 but stable. Inform provider and coordinate time for [facility] visit.   []   No red flags but URI signs or symptoms okay for [facility] visit.

## 2018-11-25 ENCOUNTER — Ambulatory Visit: Payer: BLUE CROSS/BLUE SHIELD | Admitting: Family Medicine

## 2018-11-26 ENCOUNTER — Ambulatory Visit (INDEPENDENT_AMBULATORY_CARE_PROVIDER_SITE_OTHER): Payer: BLUE CROSS/BLUE SHIELD | Admitting: Family Medicine

## 2018-11-26 ENCOUNTER — Ambulatory Visit (INDEPENDENT_AMBULATORY_CARE_PROVIDER_SITE_OTHER): Payer: BLUE CROSS/BLUE SHIELD

## 2018-11-26 ENCOUNTER — Encounter: Payer: Self-pay | Admitting: Family Medicine

## 2018-11-26 ENCOUNTER — Other Ambulatory Visit: Payer: Self-pay

## 2018-11-26 VITALS — BP 140/78 | HR 78 | Temp 97.7°F | Ht 71.0 in | Wt 181.4 lb

## 2018-11-26 DIAGNOSIS — M25511 Pain in right shoulder: Secondary | ICD-10-CM

## 2018-11-26 DIAGNOSIS — M19019 Primary osteoarthritis, unspecified shoulder: Secondary | ICD-10-CM | POA: Diagnosis not present

## 2018-11-26 MED ORDER — DICLOFENAC SODIUM 2 % TD SOLN: 1.0000 "application " | Freq: Two times a day (BID) | TRANSDERMAL | 3 refills | Status: DC

## 2018-11-26 NOTE — Progress Notes (Signed)
Cameron Rice - 68 y.o. male MRN [id number]  Date of birth: 07/19/1951  SUBJECTIVE:  Including CC & ROS.  Chief Complaint  Patient presents with  . Pain    right shoulder pain/ little over year/ may have hurt when excercise-- pt states hurts worse at night    Cameron Rice is a 68 y.o. male that is presenting with right shoulder pain.  The pain is acute on chronic.  It has been worsening as of late.  It is localized to the proximal lateral shoulder.  No radicular symptoms.  No improvement with modalities and medications to date.  No history of surgery.  It is sharp and throbbing in nature.  It is moderate to severe.  Worse with abduction type movements.  It is worse when he does certain exercises.     Review of Systems  Constitutional: Negative for fever.  HENT: Negative for congestion.   Respiratory: Negative for cough.   Cardiovascular: Negative for chest pain.  Gastrointestinal: Negative for abdominal distention.  Musculoskeletal: Positive for arthralgias.  Skin: Negative for color change.  Neurological: Negative for weakness.  Hematological: Negative for adenopathy.    HISTORY: Past Medical, Surgical, Social, and Family History Reviewed & Updated per EMR.   Pertinent Historical Findings include:  Past Medical History:  Diagnosis Date  . Hyperlipidemia   . Hypertension   . UTI (urinary tract infection)     Past Surgical History:  Procedure Laterality Date  . URETHRA SURGERY     went in clean make sure urethra is clear    No Known Allergies  Family History  Problem Relation Age of Onset  . Cancer Mother   . Cancer Brother        liver cancer  . Cancer Maternal Aunt        lung     Social History   Socioeconomic History  . Marital status: Married    Spouse name: Not on file  . Number of children: Not on file  . Years of education: Not on file  . Highest education level: Not on file  Occupational History  . Not on file  Social Needs  . Financial  resource strain: Not on file  . Food insecurity:    Worry: Not on file    Inability: Not on file  . Transportation needs:    Medical: Not on file    Non-medical: Not on file  Tobacco Use  . Smoking status: Never Smoker  . Smokeless tobacco: Never Used  Substance and Sexual Activity  . Alcohol use: No    Frequency: Never  . Drug use: No  . Sexual activity: Not on file  Lifestyle  . Physical activity:    Days per week: Not on file    Minutes per session: Not on file  . Stress: Not on file  Relationships  . Social connections:    Talks on phone: Not on file    Gets together: Not on file    Attends religious service: Not on file    Active member of club or organization: Not on file    Attends meetings of clubs or organizations: Not on file    Relationship status: Not on file  . Intimate partner violence:    Fear of current or ex partner: Not on file    Emotionally abused: Not on file    Physically abused: Not on file    Forced sexual activity: Not on file  Other Topics Concern  .  Not on file  Social History Narrative  . Not on file     PHYSICAL EXAM:  VS: BP 140/78   Pulse 78   Temp 97.7 F (36.5 C) (Oral)   Ht 5\' 11"  (1.803 m)   Wt 181 lb 6.4 oz (82.3 kg)   SpO2 96%   BMI 25.30 kg/m  Physical Exam Gen: NAD, alert, cooperative with exam, well-appearing ENT: normal lips, normal nasal mucosa,  Eye: normal EOM, normal conjunctiva and lids CV:  no edema, +2 pedal pulses   Resp: no accessory muscle use, non-labored,   Skin: no rashes, no areas of induration  Neuro: normal tone, normal sensation to touch Psych:  normal insight, alert and oriented MSK:  Right shoulder: Normal range of motion. Normal external rotation. Normal strength resistance. Normal empty can testing. Normal O'Brien's testing. Pain with crossarm liftoff. Some tenderness palpation of the AC joint. Neurovascular intact  Limited ultrasound: Right shoulder:  Biceps tendon with mild effusion  in the bicipital groove. Subscapularis with degenerative changes with possible effusion occurring from the biceps tendon. Supraspinatus with mild tendinopathy type changes. AC joint with severe degenerative changes  Summary: Pain likely result of the [facility] joint with severe degenerative changes  Ultrasound and interpretation by Clearance Coots, MD    Aspiration/Injection Procedure Note Cameron Rice [birth year]  Procedure: Injection Indications: Right shoulder pain  Procedure Details Consent: Risks of procedure as well as the alternatives and risks of each were explained to the (patient/caregiver).  Consent for procedure obtained. Time Out: Verified patient identification, verified procedure, site/side was marked, verified correct patient position, special equipment/implants available, medications/allergies/relevent history reviewed, required imaging and test results available.  Performed.  The area was cleaned with iodine and alcohol swabs.    The right AC joint was injected using 0.5 cc's of 40 mg Kenalog and 1.5 cc's of 0.25% bupivacaine with a 25 1 1/2" needle.  Ultrasound was used. Images were obtained in trans-views showing the injection.     A sterile dressing was applied.  Patient did tolerate procedure well.     ASSESSMENT & PLAN:   AC joint arthropathy Pain seems to be most consistently associated with the [facility] joint.  Possible to have some subscapularis involvement. -Injection today. -Provided samples of Pennsaid. Sent pennsaid -X-ray. -Need to consider further imaging if suspecting subscapularis or biceps tendon origin.

## 2018-11-26 NOTE — Assessment & Plan Note (Signed)
Pain seems to be most consistently associated with the [facility] joint.  Possible to have some subscapularis involvement. -Injection today. -Provided samples of Pennsaid. Sent pennsaid -X-ray. -Need to consider further imaging if suspecting subscapularis or biceps tendon origin.

## 2018-11-26 NOTE — Patient Instructions (Signed)
Nice to meet you  Take tylenol 650 mg three times a day is the best evidence based medicine we have for arthritis.  Glucosamine sulfate 750mg  twice a day is a supplement that has been shown to help moderate to severe arthritis. Vitamin D 2000 IU daily Fish oil 2 grams daily.  Tumeric 500mg  twice daily.  Pleases try ice on the daily  Please send me a message through [place] with any questions or updates.

## 2018-11-28 ENCOUNTER — Telehealth: Payer: Self-pay | Admitting: Family Medicine

## 2018-11-28 ENCOUNTER — Telehealth: Payer: Self-pay | Admitting: Nurse Practitioner

## 2018-11-28 NOTE — Telephone Encounter (Signed)
Dr. Raeford Razor please advise pt was given lab results and still states he is in pain. Baldo Ash is out of the office today.

## 2018-11-28 NOTE — Telephone Encounter (Signed)
Pt given lab results per notes of Dr Raeford Razor on 11/26/2018 Pt verbalized understanding.

## 2018-11-28 NOTE — Telephone Encounter (Signed)
Left VM for patient. If he calls back please have him speak with a nurse/CMA and inform that his xray doesn't show any shoulder joint changes. The [facility] joint does have arthritis as was seen on the Ultrasound. The [facility] can report results to patient.   If any questions then please take the best time and phone number to call and I will try to call him back.   Rosemarie Ax, MD [place] [facility] 11/28/2018, 8:32 AM

## 2018-11-29 NOTE — Telephone Encounter (Signed)
Spoke with patient about questions.   Rosemarie Ax, MD [facility] 11/29/2018, 4:13 PM

## 2018-12-09 ENCOUNTER — Telehealth: Payer: Self-pay | Admitting: Nurse Practitioner

## 2018-12-09 ENCOUNTER — Encounter: Payer: Self-pay | Admitting: Nurse Practitioner

## 2018-12-09 NOTE — Telephone Encounter (Signed)
Called and left vm for patient. Calling to schedule virtual visit with Baldo Ash.

## 2018-12-10 ENCOUNTER — Encounter: Payer: Self-pay | Admitting: Nurse Practitioner

## 2018-12-10 ENCOUNTER — Ambulatory Visit (INDEPENDENT_AMBULATORY_CARE_PROVIDER_SITE_OTHER): Payer: BLUE CROSS/BLUE SHIELD | Admitting: Nurse Practitioner

## 2018-12-10 ENCOUNTER — Ambulatory Visit: Payer: Self-pay

## 2018-12-10 VITALS — Temp 97.6°F | Ht 71.0 in | Wt 177.4 lb

## 2018-12-10 DIAGNOSIS — R630 Anorexia: Secondary | ICD-10-CM | POA: Diagnosis not present

## 2018-12-10 DIAGNOSIS — R748 Abnormal levels of other serum enzymes: Secondary | ICD-10-CM

## 2018-12-10 DIAGNOSIS — R197 Diarrhea, unspecified: Secondary | ICD-10-CM

## 2018-12-10 DIAGNOSIS — Z1211 Encounter for screening for malignant neoplasm of colon: Secondary | ICD-10-CM

## 2018-12-10 DIAGNOSIS — R1084 Generalized abdominal pain: Secondary | ICD-10-CM

## 2018-12-10 DIAGNOSIS — E876 Hypokalemia: Secondary | ICD-10-CM

## 2018-12-10 DIAGNOSIS — Z86010 Personal history of colonic polyps: Secondary | ICD-10-CM | POA: Diagnosis not present

## 2018-12-10 DIAGNOSIS — N390 Urinary tract infection, site not specified: Secondary | ICD-10-CM

## 2018-12-10 LAB — POCT URINALYSIS DIPSTICK
Blood, UA: NEGATIVE
Glucose, UA: NEGATIVE
Ketones, UA: 5
Nitrite, UA: NEGATIVE
Protein, UA: POSITIVE — AB
Spec Grav, UA: 1.015 (ref 1.010–1.025)
Urobilinogen, UA: 2 E.U./dL — AB
pH, UA: 6 (ref 5.0–8.0)

## 2018-12-10 LAB — CBC WITH DIFFERENTIAL/PLATELET
Basophils Absolute: 0.1 10*3/uL (ref 0.0–0.1)
Basophils Relative: 1.2 % (ref 0.0–3.0)
Eosinophils Absolute: 0 10*3/uL (ref 0.0–0.7)
Eosinophils Relative: 0 % (ref 0.0–5.0)
HCT: 41.7 % (ref 39.0–52.0)
Hemoglobin: 14 g/dL (ref 13.0–17.0)
Lymphocytes Relative: 10 % — ABNORMAL LOW (ref 12.0–46.0)
Lymphs Abs: 0.9 10*3/uL (ref 0.7–4.0)
MCHC: 33.6 g/dL (ref 30.0–36.0)
MCV: 93.5 fl (ref 78.0–100.0)
Monocytes Absolute: 0.4 10*3/uL (ref 0.1–1.0)
Monocytes Relative: 4.4 % (ref 3.0–12.0)
Neutro Abs: 7.2 10*3/uL (ref 1.4–7.7)
Neutrophils Relative %: 84.4 % — ABNORMAL HIGH (ref 43.0–77.0)
Platelets: 246 10*3/uL (ref 150.0–400.0)
RBC: 4.46 Mil/uL (ref 4.22–5.81)
RDW: 12.7 % (ref 11.5–15.5)
WBC: 8.5 10*3/uL (ref 4.0–10.5)

## 2018-12-10 LAB — COMPREHENSIVE METABOLIC PANEL
ALT: 40 U/L (ref 0–53)
AST: 52 U/L — ABNORMAL HIGH (ref 0–37)
Albumin: 3.9 g/dL (ref 3.5–5.2)
Alkaline Phosphatase: 97 U/L (ref 39–117)
BUN: 14 mg/dL (ref 6–23)
CO2: 25 mEq/L (ref 19–32)
Calcium: 8.8 mg/dL (ref 8.4–10.5)
Chloride: 102 mEq/L (ref 96–112)
Creatinine, Ser: 0.71 mg/dL (ref 0.40–1.50)
GFR: 134.31 mL/min (ref >60.00)
Glucose, Bld: 96 mg/dL (ref 70–99)
Potassium: 3.4 mEq/L — ABNORMAL LOW (ref 3.5–5.1)
Sodium: 137 mEq/L (ref 135–145)
Total Bilirubin: 1.3 mg/dL — ABNORMAL HIGH (ref 0.2–1.2)
Total Protein: 7.7 g/dL (ref 6.0–8.3)

## 2018-12-10 LAB — AMYLASE: Amylase: 58 U/L (ref 27–131)

## 2018-12-10 LAB — LIPASE: Lipase: 46 U/L (ref 11.0–59.0)

## 2018-12-10 MED ORDER — POTASSIUM CHLORIDE CRYS ER 20 MEQ PO TBCR: 40.0000 meq | EXTENDED_RELEASE_TABLET | Freq: Every day | ORAL | 0 refills | Status: DC

## 2018-12-10 NOTE — Telephone Encounter (Signed)
Pt already finish with appt for today.

## 2018-12-10 NOTE — Progress Notes (Addendum)
Virtual Visit via Video Note  I connected with Cameron [name] on 12/12/18 at 10:15 AM EDT by a video enabled telemedicine application and verified that I am speaking with the correct person using two identifiers.  Location: Patient: Home Provider: Office   I discussed the limitations of evaluation and management by telemedicine and the availability of in person appointments. The patient expressed understanding and agreed to proceed.  CC: pt is c/o of abd discomfort,feel like "ball", gets tired quickly, frequent urinate at times/ pt gets this alot and its turn out to be UTI/ 2 wks/   History of Present Illness: Abdominal Pain: Patient complains of abdominal pain. The pain is described as aching, colicky and cramping, and is 8/10 in intensity. Pain is located in the generalized ABD without radiation. Onset was 2 weeks ago. Symptoms have been gradually worsening since. Aggravating factors: none.  Alleviating factors: none. Associated symptoms: anorexia, diarrhea and myalgias. The patient denies belching, chills, constipation, dysuria, fever, frequency, hematochezia, hematuria, melena, nausea, sweats and vomiting. admits to drinking more soda and high fat diet. Denies any risk for STD, not sexually active at this time. Last appt with urology 08/2018: no changes made Last colonoscopy done 67yrs ago by [facility] per patient (unanle to find report), reports hx of colon polyp. CT abd/pelvis done 2011: diverticulosis (location not specified)  Observations/Objective: Physical Exam  Constitutional: He is oriented to person, place, and time. No distress.  Pulmonary/Chest: Effort normal.  Neurological: He is alert and oriented to person, place, and time.  Psychiatric: His behavior is normal.   Assessment and Plan: Cameron [name] was seen today for urinary tract infection.  Diagnoses and all orders for this visit:  Generalized abdominal pain -     POCT urinalysis dipstick -     Urine Culture -      CBC w/Diff -     Cancel: Basic metabolic panel -     Lipase -     Amylase -     Comprehensive metabolic panel  Anorexia -     POCT urinalysis dipstick -     CBC w/Diff -     Lipase -     Amylase -     Comprehensive metabolic panel -     Hepatic function panel; Future  Diarrhea, unspecified type -     CBC w/Diff -     Cancel: Basic metabolic panel  Hx of colonic polyp -     Ambulatory referral to Gastroenterology  Colon cancer screening -     Ambulatory referral to Gastroenterology  Acute hypokalemia -     potassium chloride SA (K-DUR) 20 MEQ tablet; Take 2 tablets (40 mEq total) by mouth daily. -     Basic metabolic panel; Future  Abnormal liver enzymes -     Hepatic function panel; Future  Complicated UTI (urinary tract infection) -     amoxicillin (AMOXIL) 500 MG tablet; Take 1 tablet (500 mg total) by mouth 2 (two) times daily for 10 days.   Follow Up Instructions:  Normal pancreatic enzymes. Mild decrease in potassium. KCL sent x 2days. Mild elevation in liver enzyme and bilirubin. Maintain adequate oral hydration with water. avoid sodas, tylenol, or ETOH intake. Maintain clear liquid diet x 24hrs, the advance to bland diet x 2days, the DASH diet as tolerated. Return to lab in 1week for repeat hepatic enzymes. Call office sooner if symptoms worsen.  I discussed the assessment and treatment plan with the patient. The patient was provided  an opportunity to ask questions and all were answered. The patient agreed with the plan and demonstrated an understanding of the instructions.   The patient was advised to call back or seek an in-person evaluation if the symptoms worsen or if the condition fails to improve as anticipated.   Wilfred Lacy, NP

## 2018-12-10 NOTE — Telephone Encounter (Signed)
    1  Cameron [name] Male, 68 y.o., 04-25-1951 MRN:  [id number] Phone:  [phone] (M) ... PCP:  Flossie Buffy, NP Coverage:  BLUE CROSS BLUE SHIELD/BCBS OTHER Message from [place] sent at 12/10/2018 8:43 AM EDT   Summary: tight feeling in abdo   Patient called with complaints of a "tight" feeling in abdomen. He states that the last time he felt like this he had a UTI. He denies any other symptoms. He is requesting a call back from nurse to discuss further. Please advise.         Call History    Type Contact Phone  12/10/2018 08:42 AM Phone (Incoming) [name], Cameron L (Self) [phone] (H)  User: Sheran Luz  Encounter Report   Patient Encounter Report

## 2018-12-10 NOTE — Patient Instructions (Addendum)
Normal pancreatic enzymes. Mild decrease in potassium. KCL sent x 2days. Mild elevation in liver enzyme and bilirubin. Maintain adequate oral hydration with water. avoid sodas, tylenol, or ETOH intake. Maintain clear liquid diet x 24hrs, the advance to bland diet x 2days, the DASH diet as tolerated. Return to lab in 1week for repeat hepatic enzymes. Call office sooner if symptoms worsen   Diverticulitis  Diverticulitis is when small pockets in your large intestine (colon) get infected or swollen. This causes stomach pain and watery poop (diarrhea). These pouches are called diverticula. They form in people who have a condition called diverticulosis. Follow these instructions at home: Medicines  Take over-the-counter and prescription medicines only as told by your doctor. These include: ? Antibiotics. ? Pain medicines. ? Fiber pills. ? Probiotics. ? Stool softeners.  Do not drive or use heavy machinery while taking prescription pain medicine.  If you were prescribed an antibiotic, take it as told. Do not stop taking it even if you feel better. General instructions   Follow a diet as told by your doctor.  When you feel better, your doctor may tell you to change your diet. You may need to eat a lot of fiber. Fiber makes it easier to poop (have bowel movements). Healthy foods with fiber include: ? Berries. ? Beans. ? Lentils. ? Green vegetables.  Exercise 3 or more times a week. Aim for 30 minutes each time. Exercise enough to sweat and make your heart beat faster.  Keep all follow-up visits as told. This is important. You may need to have an exam of the large intestine. This is called a colonoscopy. Contact a doctor if:  Your pain does not get better.  You have a hard time eating or drinking.  You are not pooping like normal. Get help right away if:  Your pain gets worse.  Your problems do not get better.  Your problems get worse very fast.  You have a fever.  You  throw up (vomit) more than one time.  You have poop that is: ? Bloody. ? Black. ? Tarry. Summary  Diverticulitis is when small pockets in your large intestine (colon) get infected or swollen.  Take medicines only as told by your doctor.  Follow a diet as told by your doctor. This information is not intended to replace advice given to you by your health care provider. Make sure you discuss any questions you have with your health care provider. Document Released: 01/10/2008 Document Revised: 08/10/2016 Document Reviewed: 08/10/2016 Elsevier Interactive Patient Education  2019 [place] Diet A bland diet consists of foods that are often soft and do not have a lot of fat, fiber, or extra seasonings. Foods without fat, fiber, or seasoning are easier for the body to digest. They are also less likely to irritate your mouth, throat, stomach, and other parts of your digestive system. A bland diet is sometimes called a BRAT diet. What is my plan? Your health care provider or food and nutrition specialist (dietitian) may recommend specific changes to your diet to prevent symptoms or to treat your symptoms. These changes may include:  Eating small meals often.  Cooking food until it is soft enough to chew easily.  Chewing your food well.  Drinking fluids slowly.  Not eating foods that are very spicy, sour, or fatty.  Not eating citrus fruits, such as oranges and grapefruit. What do I need to know about this diet?  Eat a variety of foods from the bland  diet food list.  Do not follow a bland diet longer than needed.  Ask your health care provider whether you should take vitamins or supplements. What foods can I eat? Grains  Hot cereals, such as cream of wheat. [name]. Bread, crackers, or tortillas made from refined white flour. Vegetables Canned or cooked vegetables. Mashed or boiled potatoes. Fruits  Bananas. Applesauce. Other types of cooked or canned fruit with the  skin and seeds removed, such as canned peaches or pears. Meats and other proteins  Scrambled eggs. Creamy peanut butter or other nut butters. Lean, well-cooked meats, such as chicken or fish. Tofu. Soups or broths. Dairy Low-fat dairy products, such as milk, cottage cheese, or yogurt. Beverages  Water. Herbal tea. Apple juice. Fats and oils Mild salad dressings. Canola or olive oil. Sweets and desserts Pudding. Custard. Fruit gelatin. Ice cream. The items listed above may not be a complete list of recommended foods and beverages. Contact a dietitian for more options. What foods are not recommended? Grains Whole grain breads and cereals. Vegetables Raw vegetables. Fruits Raw fruits, especially citrus, berries, or dried fruits. Dairy Whole fat dairy foods. Beverages Caffeinated drinks. Alcohol. Seasonings and condiments Strongly flavored seasonings or condiments. Hot sauce. Salsa. Other foods Spicy foods. Fried foods. Sour foods, such as pickled or fermented foods. Foods with high sugar content. Foods high in fiber. The items listed above may not be a complete list of foods and beverages to avoid. Contact a dietitian for more information. Summary  A bland diet consists of foods that are often soft and do not have a lot of fat, fiber, or extra seasonings.  Foods without fat, fiber, or seasoning are easier for the body to digest.  Check with your health care provider to see how long you should follow this diet plan. It is not meant to be followed for long periods. This information is not intended to replace advice given to you by your health care provider. Make sure you discuss any questions you have with your health care provider. Document Released: 11/15/2015 Document Revised: 08/22/2017 Document Reviewed: 08/22/2017 Elsevier Interactive Patient Education  2019 [employer].

## 2018-12-12 LAB — URINE CULTURE
MICRO NUMBER:: 446378
SPECIMEN QUALITY:: ADEQUATE

## 2018-12-12 MED ORDER — AMOXICILLIN 500 MG PO TABS: 500.0000 mg | ORAL_TABLET | Freq: Two times a day (BID) | ORAL | 0 refills | Status: AC

## 2018-12-12 NOTE — Addendum Note (Signed)
Addended by: Wilfred Lacy L on: 12/12/2018 10:58 AM   Modules accepted: Orders

## 2018-12-23 ENCOUNTER — Other Ambulatory Visit: Payer: Self-pay | Admitting: Nurse Practitioner

## 2018-12-23 DIAGNOSIS — E782 Mixed hyperlipidemia: Secondary | ICD-10-CM

## 2019-03-13 ENCOUNTER — Encounter: Payer: Self-pay | Admitting: Nurse Practitioner

## 2019-06-09 ENCOUNTER — Other Ambulatory Visit: Payer: Self-pay

## 2019-06-10 ENCOUNTER — Ambulatory Visit (INDEPENDENT_AMBULATORY_CARE_PROVIDER_SITE_OTHER): Payer: BC Managed Care – PPO | Admitting: Behavioral Health

## 2019-06-10 ENCOUNTER — Encounter: Payer: Self-pay | Admitting: Nurse Practitioner

## 2019-06-10 DIAGNOSIS — Z23 Encounter for immunization: Secondary | ICD-10-CM

## 2019-06-10 NOTE — Progress Notes (Signed)
Patient presents in clinic today for Influenza vaccination. IM injection was given in the right deltoid. Patient tolerated the injection well. No signs or symptoms of a reaction were noted prior to patient leaving the nurse visit.

## 2019-06-21 ENCOUNTER — Other Ambulatory Visit: Payer: Self-pay | Admitting: Nurse Practitioner

## 2019-06-21 DIAGNOSIS — E782 Mixed hyperlipidemia: Secondary | ICD-10-CM

## 2019-09-19 ENCOUNTER — Other Ambulatory Visit: Payer: Self-pay | Admitting: Nurse Practitioner

## 2019-09-19 DIAGNOSIS — E782 Mixed hyperlipidemia: Secondary | ICD-10-CM

## 2019-09-19 DIAGNOSIS — Z23 Encounter for immunization: Secondary | ICD-10-CM | POA: Diagnosis not present

## 2019-10-17 DIAGNOSIS — Z23 Encounter for immunization: Secondary | ICD-10-CM | POA: Diagnosis not present

## 2019-11-26 ENCOUNTER — Telehealth: Payer: Self-pay | Admitting: Nurse Practitioner

## 2019-11-26 NOTE — Telephone Encounter (Signed)
LVM re: Received mychart request to schedule patient for a physical with [facility].

## 2019-12-04 ENCOUNTER — Other Ambulatory Visit: Payer: Self-pay | Admitting: Nurse Practitioner

## 2019-12-04 DIAGNOSIS — I10 Essential (primary) hypertension: Secondary | ICD-10-CM

## 2019-12-08 ENCOUNTER — Other Ambulatory Visit: Payer: Self-pay

## 2019-12-09 ENCOUNTER — Ambulatory Visit (INDEPENDENT_AMBULATORY_CARE_PROVIDER_SITE_OTHER): Payer: BC Managed Care – PPO | Admitting: Nurse Practitioner

## 2019-12-09 ENCOUNTER — Encounter: Payer: Self-pay | Admitting: Nurse Practitioner

## 2019-12-09 VITALS — BP 124/82 | HR 81 | Temp 97.0°F | Ht 71.0 in | Wt 189.2 lb

## 2019-12-09 DIAGNOSIS — N401 Enlarged prostate with lower urinary tract symptoms: Secondary | ICD-10-CM

## 2019-12-09 DIAGNOSIS — R21 Rash and other nonspecific skin eruption: Secondary | ICD-10-CM

## 2019-12-09 DIAGNOSIS — R350 Frequency of micturition: Secondary | ICD-10-CM

## 2019-12-09 DIAGNOSIS — Z23 Encounter for immunization: Secondary | ICD-10-CM

## 2019-12-09 DIAGNOSIS — E7800 Pure hypercholesterolemia, unspecified: Secondary | ICD-10-CM

## 2019-12-09 DIAGNOSIS — Z0001 Encounter for general adult medical examination with abnormal findings: Secondary | ICD-10-CM

## 2019-12-09 DIAGNOSIS — I10 Essential (primary) hypertension: Secondary | ICD-10-CM | POA: Diagnosis not present

## 2019-12-09 MED ORDER — CLOTRIMAZOLE-BETAMETHASONE 1-0.05 % EX CREA: 1.0000 "application " | TOPICAL_CREAM | Freq: Two times a day (BID) | CUTANEOUS | 0 refills | Status: DC

## 2019-12-09 NOTE — Progress Notes (Signed)
Subjective:    Patient ID: Cameron Rice, male    DOB: October 15, 1950, 69 y.o.   MRN: [id number]  Patient presents today for complete physical and eval of rash  Rash This is a chronic problem. The current episode started more than 1 year ago. The problem has been waxing and waning since onset. The affected locations include the left lower leg. The rash is characterized by itchiness and scaling. He was exposed to nothing. Pertinent negatives include no congestion, cough, diarrhea, fever, joint pain, shortness of breath or sore throat. Past treatments include topical steroids and moisturizer. The treatment provided mild relief. There is no history of allergies, asthma, eczema or varicella.   HTN: BP at goal BP Readings from Last 3 Encounters:  12/09/19 124/82  11/26/18 140/78  09/06/18 (!) 140/92   Hyperlipidemia: LDL at goal with atorvastatin Lipid Panel     Component Value Date/Time   CHOL 164 12/09/2019 1346   TRIG 105.0 12/09/2019 1346   HDL 44.80 12/09/2019 1346   CHOLHDL 4 12/09/2019 1346   VLDL 21.0 12/09/2019 1346   LDLCALC 98 12/09/2019 1346   Sexual History (orientation,birth control, marital status, STD):married, sexually active  Depression/Suicide: Depression screen [facility] 2/9 12/09/2019 12/10/2018 12/28/2017 08/29/2017  Decreased Interest 0 0 0 0  Down, Depressed, Hopeless 0 0 0 0  PHQ - 2 Score 0 0 0 0   Vision:up to date.  Dental:up to date  Immunizations: (TDAP, Hep C screen, Pneumovax, Influenza, zoster)  Health Maintenance  Topic Date Due  . Colon Cancer Screening  Never done  . Pneumonia vaccines (2 of 2 - PPSV23) 06/06/2019  . Flu Shot  03/07/2020  . Tetanus Vaccine  11/29/2025  . COVID-19 Vaccine  Completed  .  Hepatitis C: One time screening is recommended by Center for Disease Control  (CDC) for  adults born from [birth year] through 1965.   Completed   Diet:heart healthy.  Weight:  Wt Readings from Last 3 Encounters:  12/09/19 189 lb 3.2 oz (85.8 kg)   12/10/18 177 lb 6.4 oz (80.5 kg)  11/26/18 181 lb 6.4 oz (82.3 kg)    Exercise:walking and jogging 2-3x/week  Fall Risk: Fall Risk  12/09/2019 12/10/2018 12/28/2017 08/29/2017  Falls in the past year? 0 0 No No  Number falls in past yr: 0 - - -  Injury with Fall? 0 - - -   Advanced Directive: Advanced Directives 12/01/2017  Does Patient Have a Medical Advance Directive? No     Medications and allergies reviewed with patient and updated if appropriate.  Patient Active Problem List   Diagnosis Date Noted  . Hx of colonic polyp 12/10/2018  . AC joint arthropathy 11/26/2018  . Need for prophylactic vaccination against Streptococcus pneumoniae (pneumococcus) and influenza 06/05/2018  . Recurrent UTI 08/29/2017  . Erectile disorder due to medical condition in male 08/29/2017  . Shift work sleep disorder 09/04/2013  . Essential hypertension 09/06/2011  . BPH (benign prostatic hyperplasia) 09/06/2011  . Pure hypercholesterolemia 09/06/2011  . Hypogonadism male 09/06/2011    Current Outpatient Medications on File Prior to Visit  Medication Sig Dispense Refill  . amLODipine-benazepril (LOTREL) 10-20 MG capsule Take 1 capsule by mouth daily. Need office visit for additional refills 90 capsule 0  . atorvastatin (LIPITOR) 40 MG tablet Take 1 tablet (40 mg total) by mouth daily at 6 PM. No additional refill without office visit 90 tablet 0  . Diclofenac Sodium (PENNSAID) 2 % SOLN Place 1 application onto the skin  2 (two) times daily. 1 Bottle 3  . Multiple Vitamin (MULTIVITAMIN) tablet Take by mouth.    . Multiple Vitamins-Minerals (ONE-A-DAY MENS 50+ ADVANTAGE) TABS     . oxybutynin (DITROPAN-XL) 5 MG 24 hr tablet TAKE 1 TABLET DAILY    . potassium chloride SA (K-DUR) 20 MEQ tablet Take 2 tablets (40 mEq total) by mouth daily. 4 tablet 0  . sildenafil (REVATIO) 20 MG tablet Take 3-5 tablets daily prn    . tamsulosin (FLOMAX) 0.4 MG CAPS capsule      No current facility-administered  medications on file prior to visit.    Past Medical History:  Diagnosis Date  . Hyperlipidemia   . Hypertension   . UTI (urinary tract infection)     Past Surgical History:  Procedure Laterality Date  . URETHRA SURGERY     went in clean make sure urethra is clear    Social History   Socioeconomic History  . Marital status: Married    Spouse name: Not on file  . Number of children: Not on file  . Years of education: Not on file  . Highest education level: Not on file  Occupational History  . Not on file  Tobacco Use  . Smoking status: Never Smoker  . Smokeless tobacco: Never Used  Substance and Sexual Activity  . Alcohol use: No  . Drug use: No  . Sexual activity: Yes  Other Topics Concern  . Not on file  Social History Narrative  . Not on file   Social Determinants of Health   Financial Resource Strain:   . Difficulty of Paying Living Expenses:   Food Insecurity:   . Worried About Charity fundraiser in the Last Year:   . Arboriculturist in the Last Year:   Transportation Needs:   . Film/video editor (Medical):   Marland Kitchen Lack of Transportation (Non-Medical):   Physical Activity:   . Days of Exercise per Week:   . Minutes of Exercise per Session:   Stress:   . Feeling of Stress :   Social Connections:   . Frequency of Communication with Friends and Family:   . Frequency of Social Gatherings with Friends and Family:   . Attends Religious Services:   . Active Member of Clubs or Organizations:   . Attends Archivist Meetings:   Marland Kitchen Marital Status:     Family History  Problem Relation Age of Onset  . Cancer Mother   . Cancer Brother        liver cancer  . Cancer Maternal Aunt        lung        Review of Systems  Constitutional: Negative for fever, malaise/fatigue and weight loss.  HENT: Negative for congestion and sore throat.   Eyes:       Negative for visual changes  Respiratory: Negative for cough and shortness of breath.    Cardiovascular: Negative for chest pain, palpitations and leg swelling.  Gastrointestinal: Negative for blood in stool, constipation, diarrhea and heartburn.  Genitourinary: Positive for frequency. Negative for dysuria, flank pain, hematuria and urgency.  Musculoskeletal: Negative for falls, joint pain and myalgias.  Skin: Positive for itching and rash.  Neurological: Negative for dizziness, sensory change and headaches.  Endo/Heme/Allergies: Does not bruise/bleed easily.  Psychiatric/Behavioral: Negative for depression, substance abuse and suicidal ideas. The patient is not nervous/anxious.    Objective:   Vitals:   12/09/19 1306  BP: 124/82  Pulse: 81  Temp: (!) 97 F (36.1 C)  SpO2: 96%   Body mass index is 26.39 kg/m.  Physical Examination:  Physical Exam Vitals reviewed.  Constitutional:      General: He is not in acute distress.    Appearance: He is well-developed.  HENT:     Right Ear: Tympanic membrane, ear canal and external ear normal.     Left Ear: Tympanic membrane, ear canal and external ear normal.     Mouth/Throat:     Pharynx: No oropharyngeal exudate.  Eyes:     Extraocular Movements: Extraocular movements intact.     Conjunctiva/sclera: Conjunctivae normal.  Cardiovascular:     Rate and Rhythm: Normal rate and regular rhythm.     Pulses: Normal pulses.     Heart sounds: Normal heart sounds.  Pulmonary:     Effort: Pulmonary effort is normal. No respiratory distress.     Breath sounds: Normal breath sounds.  Chest:     Chest wall: No tenderness.  Abdominal:     General: Bowel sounds are normal.     Palpations: Abdomen is soft.  Genitourinary:    Comments: Deferred to urology per patient Musculoskeletal:        General: Normal range of motion.     Cervical back: Normal range of motion and neck supple.     Right lower leg: No edema.     Left lower leg: No edema.  Skin:    General: Skin is warm and dry.     Findings: Lesion and rash present.  Rash is scaling.       Neurological:     Mental Status: He is alert and oriented to person, place, and time.     Deep Tendon Reflexes: Reflexes are normal and symmetric.  Psychiatric:        Mood and Affect: Mood normal.        Behavior: Behavior normal.        Thought Content: Thought content normal.     ASSESSMENT and PLAN: This visit occurred during the SARS-CoV-2 public health emergency.  Safety protocols were in place, including screening questions prior to the visit, additional usage of staff PPE, and extensive cleaning of exam room while observing appropriate contact time as indicated for disinfecting solutions.   Cameron Rice was seen today for annual exam.  Diagnoses and all orders for this visit:  Encounter for preventative adult health care exam with abnormal findings -     Basic metabolic panel -     Hepatic function panel -     TSH  Essential hypertension  Benign prostatic hyperplasia with urinary frequency -     PSA  Pure hypercholesterolemia -     Lipid panel  Scaly patch rash -     clotrimazole-betamethasone (LOTRISONE) cream; Apply 1 application topically 2 (two) times daily. -     Ambulatory referral to Dermatology   No problem-specific Assessment & Plan notes found for this encounter.      Problem List Items Addressed This Visit      Cardiovascular and Mediastinum   Essential hypertension     Genitourinary   BPH (benign prostatic hyperplasia)   Relevant Orders   PSA     Other   Pure hypercholesterolemia   Relevant Orders   Lipid panel    Other Visit Diagnoses    Encounter for preventative adult health care exam with abnormal findings    -  Primary   Relevant Orders   Basic metabolic panel  Hepatic function panel   TSH   Scaly patch rash       Relevant Medications   clotrimazole-betamethasone (LOTRISONE) cream   Other Relevant Orders   Ambulatory referral to Dermatology      Follow up: Return in about 6 months (around 06/10/2020) for  HTN and , hyperlipidemia.  Wilfred Lacy, NP

## 2019-12-09 NOTE — Patient Instructions (Signed)
Start lotrisone cream for rash on leg x 2weeks. If no improvement, schedule ppt with dermatology. Referral entered  Bring copy of previous colonoscopy report.  Go to lab for blood draw  Will fax copy of PSA result to urology.  Stretch before and after exercise to help with hip and back pain.  Preventive Care 40 Years and Older, Male Preventive care refers to lifestyle choices and visits with your health care provider that can promote health and wellness. This includes:  A yearly physical exam. This is also called an annual well check.  Regular dental and eye exams.  Immunizations.  Screening for certain conditions.  Healthy lifestyle choices, such as diet and exercise. What can I expect for my preventive care visit? Physical exam Your health care provider will check:  Height and weight. These may be used to calculate body mass index (BMI), which is a measurement that tells if you are at a healthy weight.  Heart rate and blood pressure.  Your skin for abnormal spots. Counseling Your health care provider may ask you questions about:  Alcohol, tobacco, and drug use.  Emotional well-being.  Home and relationship well-being.  Sexual activity.  Eating habits.  History of falls.  Memory and ability to understand (cognition).  Work and work Statistician. What immunizations do I need?  Influenza (flu) vaccine  This is recommended every year. Tetanus, diphtheria, and pertussis (Tdap) vaccine  You may need a Td booster every 10 years. Varicella (chickenpox) vaccine  You may need this vaccine if you have not already been vaccinated. Zoster (shingles) vaccine  You may need this after age 56. Pneumococcal conjugate (PCV13) vaccine  One dose is recommended after age 20. Pneumococcal polysaccharide (PPSV23) vaccine  One dose is recommended after age 59. Measles, mumps, and rubella (MMR) vaccine  You may need at least one dose of MMR if you were born in 1957 or  later. You may also need a second dose. Meningococcal conjugate (MenACWY) vaccine  You may need this if you have certain conditions. Hepatitis A vaccine  You may need this if you have certain conditions or if you travel or work in places where you may be exposed to hepatitis A. Hepatitis B vaccine  You may need this if you have certain conditions or if you travel or work in places where you may be exposed to hepatitis B. Haemophilus influenzae type b (Hib) vaccine  You may need this if you have certain conditions. You may receive vaccines as individual doses or as more than one vaccine together in one shot (combination vaccines). Talk with your health care provider about the risks and benefits of combination vaccines. What tests do I need? Blood tests  Lipid and cholesterol levels. These may be checked every 5 years, or more frequently depending on your overall health.  Hepatitis C test.  Hepatitis B test. Screening  Lung cancer screening. You may have this screening every year starting at age 53 if you have a 30-pack-year history of smoking and currently smoke or have quit within the past 15 years.  Colorectal cancer screening. All adults should have this screening starting at age 20 and continuing until age 63. Your health care provider may recommend screening at age 64 if you are at increased risk. You will have tests every 1-10 years, depending on your results and the type of screening test.  Prostate cancer screening. Recommendations will vary depending on your family history and other risks.  Diabetes screening. This is done by checking  your blood sugar (glucose) after you have not eaten for a while (fasting). You may have this done every 1-3 years.  Abdominal aortic aneurysm (AAA) screening. You may need this if you are a current or former smoker.  Sexually transmitted disease (STD) testing. Follow these instructions at home: Eating and drinking  Eat a diet that includes  fresh fruits and vegetables, whole grains, lean protein, and low-fat dairy products. Limit your intake of foods with high amounts of sugar, saturated fats, and salt.  Take vitamin and mineral supplements as recommended by your health care provider.  Do not drink alcohol if your health care provider tells you not to drink.  If you drink alcohol: ? Limit how much you have to 0-2 drinks a day. ? Be aware of how much alcohol is in your drink. In the [place] one drink equals one 12 oz bottle of beer (355 mL), one 5 oz glass of wine (148 mL), or one 1 oz glass of hard liquor (44 mL). Lifestyle  Take daily care of your teeth and gums.  Stay active. Exercise for at least 30 minutes on 5 or more days each week.  Do not use any products that contain nicotine or tobacco, such as cigarettes, e-cigarettes, and chewing tobacco. If you need help quitting, ask your health care provider.  If you are sexually active, practice safe sex. Use a condom or other form of protection to prevent STIs (sexually transmitted infections).  Talk with your health care provider about taking a low-dose aspirin or statin. What's next?  Visit your health care provider once a year for a well check visit.  Ask your health care provider how often you should have your eyes and teeth checked.  Stay up to date on all vaccines. This information is not intended to replace advice given to you by your health care provider. Make sure you discuss any questions you have with your health care provider. Document Revised: 07/18/2018 Document Reviewed: 07/18/2018 Elsevier Patient Education  2020 [employer].

## 2019-12-10 LAB — HEPATIC FUNCTION PANEL
ALT: 29 U/L (ref 0–53)
AST: 24 U/L (ref 0–37)
Albumin: 4.4 g/dL (ref 3.5–5.2)
Alkaline Phosphatase: 92 U/L (ref 39–117)
Bilirubin, Direct: 0.3 mg/dL (ref 0.0–0.3)
Total Bilirubin: 1 mg/dL (ref 0.2–1.2)
Total Protein: 7.5 g/dL (ref 6.0–8.3)

## 2019-12-10 LAB — BASIC METABOLIC PANEL
BUN: 18 mg/dL (ref 6–23)
CO2: 25 mEq/L (ref 19–32)
Calcium: 9.4 mg/dL (ref 8.4–10.5)
Chloride: 105 mEq/L (ref 96–112)
Creatinine, Ser: 0.69 mg/dL (ref 0.40–1.50)
GFR: 138.39 mL/min (ref >60.00)
Glucose, Bld: 93 mg/dL (ref 70–99)
Potassium: 3.6 mEq/L (ref 3.5–5.1)
Sodium: 135 mEq/L (ref 135–145)

## 2019-12-10 LAB — LIPID PANEL
Cholesterol: 164 mg/dL (ref 0–200)
HDL: 44.8 mg/dL (ref >39.00)
LDL Cholesterol: 98 mg/dL (ref 0–99)
NonHDL: 119.33
Total CHOL/HDL Ratio: 4
Triglycerides: 105 mg/dL (ref 0.0–149.0)
VLDL: 21 mg/dL (ref 0.0–40.0)

## 2019-12-10 LAB — TSH: TSH: 0.9 u[IU]/mL (ref 0.35–4.50)

## 2019-12-10 LAB — PSA: PSA: 0.45 ng/mL (ref 0.10–4.00)

## 2019-12-11 LAB — URINALYSIS W MICROSCOPIC + REFLEX CULTURE
Bacteria, UA: NONE SEEN /HPF
Bilirubin Urine: NEGATIVE
Glucose, UA: NEGATIVE
Hgb urine dipstick: NEGATIVE
Hyaline Cast: NONE SEEN /LPF
Ketones, ur: NEGATIVE
Nitrites, Initial: NEGATIVE
Protein, ur: NEGATIVE
RBC / HPF: NONE SEEN /HPF (ref 0–2)
Specific Gravity, Urine: 1.007 (ref 1.001–1.03)
Squamous Epithelial / LPF: NONE SEEN /HPF (ref <=5)
pH: 6 (ref 5.0–8.0)

## 2019-12-11 LAB — URINE CULTURE
MICRO NUMBER:: 10439984
Result:: NO GROWTH
SPECIMEN QUALITY:: ADEQUATE

## 2019-12-11 LAB — CULTURE INDICATED

## 2019-12-12 ENCOUNTER — Encounter: Payer: Self-pay | Admitting: Nurse Practitioner

## 2019-12-18 ENCOUNTER — Other Ambulatory Visit: Payer: Self-pay | Admitting: Nurse Practitioner

## 2019-12-18 DIAGNOSIS — E782 Mixed hyperlipidemia: Secondary | ICD-10-CM

## 2019-12-30 DIAGNOSIS — N400 Enlarged prostate without lower urinary tract symptoms: Secondary | ICD-10-CM | POA: Diagnosis not present

## 2019-12-30 DIAGNOSIS — N529 Male erectile dysfunction, unspecified: Secondary | ICD-10-CM | POA: Diagnosis not present

## 2020-03-03 ENCOUNTER — Other Ambulatory Visit: Payer: Self-pay | Admitting: Nurse Practitioner

## 2020-03-03 DIAGNOSIS — I10 Essential (primary) hypertension: Secondary | ICD-10-CM

## 2020-06-08 ENCOUNTER — Ambulatory Visit (INDEPENDENT_AMBULATORY_CARE_PROVIDER_SITE_OTHER): Payer: BC Managed Care – PPO

## 2020-06-08 ENCOUNTER — Telehealth: Payer: Self-pay

## 2020-06-08 ENCOUNTER — Other Ambulatory Visit: Payer: Self-pay

## 2020-06-08 DIAGNOSIS — Z23 Encounter for immunization: Secondary | ICD-10-CM | POA: Diagnosis not present

## 2020-06-08 NOTE — Telephone Encounter (Signed)
Shringrix can be administered in office if he does not have medicare

## 2020-06-08 NOTE — Telephone Encounter (Signed)
lft VM that he can schedule a nurse visit to get the Shringrix vaccine.   Dm/cma

## 2020-06-08 NOTE — Progress Notes (Signed)
Per orders of Charlotte Nche, NP, injection of Influenza given by Fraser Busche, cma.  Patient tolerated injection well.   

## 2020-06-08 NOTE — Telephone Encounter (Signed)
While patient was here for his flu shot he asked about getting the shingles vaccine.  I advised him that you would have to approved getting that one (after I spoke to [place]). Can you please review and advise.  Then he can make another nurse visit to come get it.  Thanks.  Dm/cma

## 2020-06-08 NOTE — Telephone Encounter (Signed)
Patient called back and scheduled an appt tomorrow at 3 pm.  Dm/cma

## 2020-06-09 ENCOUNTER — Ambulatory Visit (INDEPENDENT_AMBULATORY_CARE_PROVIDER_SITE_OTHER): Payer: BC Managed Care – PPO

## 2020-06-09 DIAGNOSIS — Z23 Encounter for immunization: Secondary | ICD-10-CM | POA: Diagnosis not present

## 2020-06-09 NOTE — Progress Notes (Signed)
Per orders of Nche, NP,  injection of Shingrix given by Tascha Casares L Tishia Maestre in right deltoid. Patient tolerated injection well.

## 2020-06-09 NOTE — Progress Notes (Signed)
I reviewed and agree with the documentation and plan as outlined below.   

## 2020-06-09 NOTE — Patient Instructions (Signed)
There are no preventive care reminders to display for this patient.  Depression screen [facility] 2/9 12/09/2019 12/10/2018 12/28/2017  Decreased Interest 0 0 0  Down, Depressed, Hopeless 0 0 0  PHQ - 2 Score 0 0 0

## 2020-07-21 ENCOUNTER — Other Ambulatory Visit: Payer: Self-pay

## 2020-07-22 ENCOUNTER — Encounter: Payer: Self-pay | Admitting: Family

## 2020-07-22 ENCOUNTER — Ambulatory Visit (INDEPENDENT_AMBULATORY_CARE_PROVIDER_SITE_OTHER): Payer: BC Managed Care – PPO | Admitting: Family

## 2020-07-22 VITALS — BP 122/76 | HR 88 | Temp 97.4°F | Ht 71.0 in | Wt 188.2 lb

## 2020-07-22 DIAGNOSIS — M5416 Radiculopathy, lumbar region: Secondary | ICD-10-CM

## 2020-07-22 MED ORDER — PREDNISONE 20 MG PO TABS: 40.0000 mg | ORAL_TABLET | Freq: Every day | ORAL | 0 refills | Status: DC

## 2020-07-22 NOTE — Patient Instructions (Signed)
Sciatica  Sciatica is pain, weakness, tingling, or loss of feeling (numbness) along the sciatic nerve. The sciatic nerve starts in the lower back and goes down the back of each leg. Sciatica usually goes away on its own or with treatment. Sometimes, sciatica may come back (recur). What are the causes? This condition happens when the sciatic nerve is pinched or has pressure put on it. This may be the result of:  A disk in between the bones of the spine bulging out too far (herniated disk).  Changes in the spinal disks that occur with aging.  A condition that affects a muscle in the butt.  Extra bone growth near the sciatic nerve.  A break (fracture) of the area between your hip bones (pelvis).  Pregnancy.  Tumor. This is rare. What increases the risk? You are more likely to develop this condition if you:  Play sports that put pressure or stress on the spine.  Have poor strength and ease of movement (flexibility).  Have had a back injury in the past.  Have had back surgery.  Sit for long periods of time.  Do activities that involve bending or lifting over and over again.  Are very overweight (obese). What are the signs or symptoms? Symptoms can vary from mild to very bad. They may include:  Any of these problems in the lower back, leg, hip, or butt: ? Mild tingling, loss of feeling, or dull aches. ? Burning sensations. ? Sharp pains.  Loss of feeling in the back of the calf or the sole of the foot.  Leg weakness.  Very bad back pain that makes it hard to move. These symptoms may get worse when you cough, sneeze, or laugh. They may also get worse when you sit or stand for long periods of time. How is this treated? This condition often gets better without any treatment. However, treatment may include:  Changing or cutting back on physical activity when you have pain.  Doing exercises and stretching.  Putting ice or heat on the affected area.  Medicines that  help: ? To relieve pain and swelling. ? To relax your muscles.  Shots (injections) of medicines that help to relieve pain, irritation, and swelling.  Surgery. Follow these instructions at home: Medicines  Take over-the-counter and prescription medicines only as told by your doctor.  Ask your doctor if the medicine prescribed to you: ? Requires you to avoid driving or using heavy machinery. ? Can cause trouble pooping (constipation). You may need to take these steps to prevent or treat trouble pooping:  Drink enough fluids to keep your pee (urine) pale yellow.  Take over-the-counter or prescription medicines.  Eat foods that are high in fiber. These include beans, whole grains, and fresh fruits and vegetables.  Limit foods that are high in fat and sugar. These include fried or sweet foods. Managing pain      If told, put ice on the affected area. ? Put ice in a plastic bag. ? Place a towel between your skin and the bag. ? Leave the ice on for 20 minutes, 2-3 times a day.  If told, put heat on the affected area. Use the heat source that your doctor tells you to use, such as a moist heat pack or a heating pad. ? Place a towel between your skin and the heat source. ? Leave the heat on for 20-30 minutes. ? Remove the heat if your skin turns bright red. This is very important if you are   unable to feel pain, heat, or cold. You may have a greater risk of getting burned. Activity   Return to your normal activities as told by your doctor. Ask your doctor what activities are safe for you.  Avoid activities that make your symptoms worse.  Take short rests during the day. ? When you rest for a long time, do some physical activity or stretching between periods of rest. ? Avoid sitting for a long time without moving. Get up and move around at least one time each hour.  Exercise and stretch regularly, as told by your doctor.  Do not lift anything that is heavier than 10 lb (4.5 kg)  while you have symptoms of sciatica. ? Avoid lifting heavy things even when you do not have symptoms. ? Avoid lifting heavy things over and over.  When you lift objects, always lift in a way that is safe for your body. To do this, you should: ? Bend your knees. ? Keep the object close to your body. ? Avoid twisting. General instructions  Stay at a healthy weight.  Wear comfortable shoes that support your feet. Avoid wearing high heels.  Avoid sleeping on a mattress that is too soft or too hard. You might have less pain if you sleep on a mattress that is firm enough to support your back.  Keep all follow-up visits as told by your doctor. This is important. Contact a doctor if:  You have pain that: ? Wakes you up when you are sleeping. ? Gets worse when you lie down. ? Is worse than the pain you have had in the past. ? Lasts longer than 4 weeks.  You lose weight without trying. Get help right away if:  You cannot control when you pee (urinate) or poop (have a bowel movement).  You have weakness in any of these areas and it gets worse: ? Lower back. ? The area between your hip bones. ? Butt. ? Legs.  You have redness or swelling of your back.  You have a burning feeling when you pee. Summary  Sciatica is pain, weakness, tingling, or loss of feeling (numbness) along the sciatic nerve.  This condition happens when the sciatic nerve is pinched or has pressure put on it.  Sciatica can cause pain, tingling, or loss of feeling (numbness) in the lower back, legs, hips, and butt.  Treatment often includes rest, exercise, medicines, and putting ice or heat on the affected area. This information is not intended to replace advice given to you by your health care provider. Make sure you discuss any questions you have with your health care provider. Document Revised: 08/12/2018 Document Reviewed: 08/12/2018 Elsevier Patient Education  2020 [employer]  

## 2020-07-22 NOTE — Progress Notes (Signed)
Acute Office Visit  Subjective:    Patient ID: Cameron Rice, male    DOB: Dec 20, 1950, 69 y.o.   MRN: [id number]  Chief Complaint  Patient presents with  . Leg Pain    C/O leg pain in the back of both legs going on for years but have become worse over time.     HPI Patient is in today with c/o pain that radiates down the back of the right leg x 1 week. He has a history of sciatica in the past and has taken Ibuprofen that helps. He is a runner, normally 8 miles a day and is currently running 4 miles and walking 4 miles due to the pain. Patient reports a change in his schedule at work that he thinks attributes to his pain. 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 He is also He  Has been massaging the area and taking 3-4 motrin that has not helped.   Past Medical History:  Diagnosis Date  . Hyperlipidemia   . Hypertension   . UTI (urinary tract infection)     Past Surgical History:  Procedure Laterality Date  . URETHRA SURGERY     went in clean make sure urethra is clear    Family History  Problem Relation Age of Onset  . Cancer Mother   . Cancer Brother        liver cancer  . Cancer Maternal Aunt        lung    Social History   Socioeconomic History  . Marital status: Married    Spouse name: Not on file  . Number of children: Not on file  . Years of education: Not on file  . Highest education level: Not on file  Occupational History  . Not on file  Tobacco Use  . Smoking status: Never Smoker  . Smokeless tobacco: Never Used  Vaping Use  . Vaping Use: Never used  Substance and Sexual Activity  . Alcohol use: No  . Drug use: No  . Sexual activity: Yes  Other Topics Concern  . Not on file  Social History Narrative  . Not on file   Social Determinants of Health   Financial Resource Strain: Not on file  Food Insecurity: Not on file  Transportation Needs: Not on file  Physical Activity: Not on file  Stress: Not on file   Social Connections: Not on file  Intimate Partner Violence: Not on file    Outpatient Medications Prior to Visit  Medication Sig Dispense Refill  . amLODipine-benazepril (LOTREL) 10-20 MG capsule Take 1 capsule by mouth daily. 90 capsule 3  . atorvastatin (LIPITOR) 40 MG tablet TAKE 1 TABLET DAILY AT 6PM (NO ADDITIONAL REFILL WITHOUT OFFICE VISIT) 90 tablet 3  . Multiple Vitamins-Minerals (ONE-A-DAY MENS 50+ ADVANTAGE) TABS     . oxybutynin (DITROPAN-XL) 5 MG 24 hr tablet TAKE 1 TABLET DAILY    . potassium chloride SA (K-DUR) 20 MEQ tablet Take 2 tablets (40 mEq total) by mouth daily. 4 tablet 0  . sildenafil (REVATIO) 20 MG tablet Take 3-5 tablets daily prn    . tamsulosin (FLOMAX) 0.4 MG CAPS capsule     . clotrimazole-betamethasone (LOTRISONE) cream Apply 1 application topically 2 (two) times daily. (Patient not taking: Reported on 07/22/2020) 30 g 0  . Diclofenac Sodium (PENNSAID) 2 % SOLN Place 1 application onto the skin 2 (two) times daily. (Patient not taking: Reported on 07/22/2020) 1 Bottle 3  .  Multiple Vitamin (MULTIVITAMIN) tablet Take by mouth.     No facility-administered medications prior to visit.    No Known Allergies  Review of Systems  Musculoskeletal: Positive for back pain.       Pain radiates down the back of the right leg  Skin: Negative.   Neurological: Positive for numbness.       Right leg  Hematological: Negative.   Psychiatric/Behavioral: Negative.   All other systems reviewed and are negative.      Objective:    Physical Exam Vitals reviewed.  Constitutional:      Appearance: Normal appearance.  Cardiovascular:     Rate and Rhythm: Normal rate and regular rhythm.     Pulses: Normal pulses.     Heart sounds: Normal heart sounds.  Pulmonary:     Effort: Pulmonary effort is normal.     Breath sounds: Normal breath sounds.  Abdominal:     General: Abdomen is flat.     Palpations: Abdomen is soft.  Musculoskeletal:        General: No  swelling, tenderness, deformity or signs of injury. Normal range of motion.     Cervical back: Normal range of motion and neck supple.     Right lower leg: No edema.     Left lower leg: No edema.  Skin:    General: Skin is warm and dry.  Neurological:     General: No focal deficit present.     Mental Status: He is alert and oriented to person, place, and time.  Psychiatric:        Mood and Affect: Mood normal.        Behavior: Behavior normal.     BP 122/76   Pulse 88   Temp (!) 97.4 F (36.3 C) (Temporal)   Ht 5\' 11"  (1.803 m)   Wt 188 lb 3.2 oz (85.4 kg)   SpO2 95%   BMI 26.25 kg/m  Wt Readings from Last 3 Encounters:  07/22/20 188 lb 3.2 oz (85.4 kg)  12/09/19 189 lb 3.2 oz (85.8 kg)  12/10/18 177 lb 6.4 oz (80.5 kg)    Health Maintenance Due  Topic Date Due  . COVID-19 Vaccine (3 - Booster for Moderna series) 04/18/2020    There are no preventive care reminders to display for this patient.   Lab Results  Component Value Date   TSH 0.90 12/09/2019   Lab Results  Component Value Date   WBC 8.5 12/10/2018   HGB 14.0 12/10/2018   HCT 41.7 12/10/2018   MCV 93.5 12/10/2018   PLT 246.0 12/10/2018   Lab Results  Component Value Date   NA 135 12/09/2019   K 3.6 12/09/2019   CO2 25 12/09/2019   GLUCOSE 93 12/09/2019   BUN 18 12/09/2019   CREATININE 0.69 12/09/2019   BILITOT 1.0 12/09/2019   ALKPHOS 92 12/09/2019   AST 24 12/09/2019   ALT 29 12/09/2019   PROT 7.5 12/09/2019   ALBUMIN 4.4 12/09/2019   CALCIUM 9.4 12/09/2019   GFR 138.39 12/09/2019   Lab Results  Component Value Date   CHOL 164 12/09/2019   Lab Results  Component Value Date   HDL 44.80 12/09/2019   Lab Results  Component Value Date   LDLCALC 98 12/09/2019   Lab Results  Component Value Date   TRIG 105.0 12/09/2019   Lab Results  Component Value Date   CHOLHDL 4 12/09/2019   No results found for: HGBA1C     Assessment &  Plan:   Problem List Items Addressed This Visit    None   Visit Diagnoses    Lumbar radiculopathy    -  Primary       Meds ordered this encounter  Medications  . predniSONE (DELTASONE) 20 MG tablet    Sig: Take 2 tablets (40 mg total) by mouth daily with breakfast.    Dispense:  10 tablet    Refill:  0   Call the office if symptoms worse or persist.   Kennyth Arnold, FNP

## 2020-08-03 ENCOUNTER — Encounter: Payer: Self-pay | Admitting: Nurse Practitioner

## 2020-08-04 MED ORDER — PREDNISONE 20 MG PO TABS: 20.0000 mg | ORAL_TABLET | Freq: Every day | ORAL | 0 refills | Status: DC

## 2020-08-04 NOTE — Telephone Encounter (Signed)
I will refill with a more aggressive treatment of prednisone. If no better, let me know.

## 2020-08-31 ENCOUNTER — Encounter: Payer: Self-pay | Admitting: Nurse Practitioner

## 2020-08-31 MED ORDER — IBUPROFEN 800 MG PO TABS
800.0000 mg | ORAL_TABLET | Freq: Three times a day (TID) | ORAL | 1 refills | Status: DC | PRN
Start: 2020-08-31 — End: 2020-10-15

## 2020-10-11 ENCOUNTER — Encounter: Payer: Self-pay | Admitting: Nurse Practitioner

## 2020-10-12 ENCOUNTER — Ambulatory Visit (INDEPENDENT_AMBULATORY_CARE_PROVIDER_SITE_OTHER): Payer: BC Managed Care – PPO | Admitting: Nurse Practitioner

## 2020-10-12 ENCOUNTER — Encounter: Payer: Self-pay | Admitting: Nurse Practitioner

## 2020-10-12 ENCOUNTER — Other Ambulatory Visit: Payer: Self-pay

## 2020-10-12 VITALS — BP 120/78 | HR 97 | Temp 98.0°F | Ht 71.0 in | Wt 186.4 lb

## 2020-10-12 DIAGNOSIS — N401 Enlarged prostate with lower urinary tract symptoms: Secondary | ICD-10-CM

## 2020-10-12 DIAGNOSIS — N390 Urinary tract infection, site not specified: Secondary | ICD-10-CM | POA: Diagnosis not present

## 2020-10-12 LAB — POCT URINALYSIS DIPSTICK
Bilirubin, UA: NEGATIVE
Glucose, UA: NEGATIVE
Ketones, UA: NEGATIVE
Protein, UA: POSITIVE — AB
Spec Grav, UA: 1.02 (ref 1.010–1.025)
Urobilinogen, UA: 1 E.U./dL
pH, UA: 6 (ref 5.0–8.0)

## 2020-10-12 MED ORDER — CIPROFLOXACIN HCL 500 MG PO TABS: 500.0000 mg | ORAL_TABLET | Freq: Two times a day (BID) | ORAL | 0 refills | Status: DC

## 2020-10-12 NOTE — Progress Notes (Signed)
Subjective:  Patient ID: Cameron Rice, male    DOB: 11-Dec-1950  Age: 70 y.o. MRN: [id number]  CC: Acute Visit (Pt c/o possible UTI, urine odor, cloudy,urinary frequency x 2 days. )  Urinary Tract Infection  This is a new problem. The current episode started yesterday. The problem occurs every urination. The problem has been unchanged. The quality of the pain is described as burning. There has been no fever. He is sexually active. There is no history of pyelonephritis. Associated symptoms include frequency, hesitancy and urgency. Pertinent negatives include no chills, discharge, flank pain, hematuria, nausea, possible pregnancy, sweats or vomiting. He has tried increased fluids for the symptoms. The treatment provided mild relief. His past medical history is significant for recurrent UTIs and urinary stasis.   Reviewed past Medical, Social and Family history today.  Outpatient Medications Prior to Visit  Medication Sig Dispense Refill  . amLODipine-benazepril (LOTREL) 10-20 MG capsule Take 1 capsule by mouth daily. 90 capsule 3  . atorvastatin (LIPITOR) 40 MG tablet TAKE 1 TABLET DAILY AT 6PM (NO ADDITIONAL REFILL WITHOUT OFFICE VISIT) 90 tablet 3  . ibuprofen (ADVIL) 800 MG tablet Take 1 tablet (800 mg total) by mouth every 8 (eight) hours as needed. 60 tablet 1  . Multiple Vitamins-Minerals (ONE-A-DAY MENS 50+ ADVANTAGE) TABS     . oxybutynin (DITROPAN-XL) 5 MG 24 hr tablet TAKE 1 TABLET DAILY    . potassium chloride SA (K-DUR) 20 MEQ tablet Take 2 tablets (40 mEq total) by mouth daily. 4 tablet 0  . sildenafil (REVATIO) 20 MG tablet Take 3-5 tablets daily prn    . tamsulosin (FLOMAX) 0.4 MG CAPS capsule     . clotrimazole-betamethasone (LOTRISONE) cream Apply 1 application topically 2 (two) times daily. (Patient not taking: No sig reported) 30 g 0  . Diclofenac Sodium (PENNSAID) 2 % SOLN Place 1 application onto the skin 2 (two) times daily. (Patient not taking: No sig reported) 1 Bottle  3  . predniSONE (DELTASONE) 20 MG tablet Take 1 tablet (20 mg total) by mouth daily with breakfast. 3 tabs po qam x 3 days 2 tabs po qam x 3 days 1 tab po qam x 3 days (Patient not taking: Reported on 10/12/2020) 18 tablet 0   No facility-administered medications prior to visit.    ROS See HPI  Objective:  BP 120/78 (BP Location: Left Arm, Patient Position: Sitting, Cuff Size: Large)   Pulse 97   Temp 98 F (36.7 C) (Temporal)   Ht 5\' 11"  (1.803 m)   Wt 186 lb 6.4 oz (84.6 kg)   SpO2 96%   BMI 26.00 kg/m   Physical Exam Vitals reviewed.  Constitutional:      General: He is not in acute distress. Abdominal:     Palpations: Abdomen is soft.     Tenderness: There is no abdominal tenderness. There is no right CVA tenderness, left CVA tenderness or guarding.  Neurological:     Mental Status: He is alert.    Assessment & Plan:  This visit occurred during the SARS-CoV-2 public health emergency.  Safety protocols were in place, including screening questions prior to the visit, additional usage of staff PPE, and extensive cleaning of exam room while observing appropriate contact time as indicated for disinfecting solutions.   Cameron Rice was seen today for acute visit.  Diagnoses and all orders for this visit:  Complicated UTI (urinary tract infection) -     POCT Urinalysis Dipstick -     Discontinue:  ciprofloxacin (CIPRO) 500 MG tablet; Take 1 tablet (500 mg total) by mouth 2 (two) times daily. -     Urine Culture -     ciprofloxacin (CIPRO) 500 MG tablet; Take 1 tablet (500 mg total) by mouth 2 (two) times daily.  Benign prostatic hyperplasia with urinary frequency -     Discontinue: ciprofloxacin (CIPRO) 500 MG tablet; Take 1 tablet (500 mg total) by mouth 2 (two) times daily. -     Urine Culture -     ciprofloxacin (CIPRO) 500 MG tablet; Take 1 tablet (500 mg total) by mouth 2 (two) times daily.    Problem List Items Addressed This Visit      Genitourinary   BPH (benign  prostatic hyperplasia)   Relevant Medications   ciprofloxacin (CIPRO) 500 MG tablet   Other Relevant Orders   Urine Culture    Other Visit Diagnoses    Complicated UTI (urinary tract infection)    -  Primary   Relevant Medications   ciprofloxacin (CIPRO) 500 MG tablet   Other Relevant Orders   POCT Urinalysis Dipstick (Completed)   Urine Culture      Follow-up: Return if symptoms worsen or fail to improve.  Wilfred Lacy, NP

## 2020-10-12 NOTE — Patient Instructions (Addendum)
Continue adequate oral hydration Urine sent for culture Start cipro.

## 2020-10-14 LAB — URINE CULTURE
MICRO NUMBER:: 11621335
SPECIMEN QUALITY:: ADEQUATE

## 2020-10-15 ENCOUNTER — Other Ambulatory Visit: Payer: Self-pay | Admitting: Nurse Practitioner

## 2020-10-15 NOTE — Telephone Encounter (Signed)
[facility] at [facility] [employer] called and said said they had sent over a refill request for ibuprofen (ADVIL) 800 MG tablet and said that they hadnt heard anything back, I did confirm they had the right fax number.

## 2020-10-18 MED ORDER — IBUPROFEN 800 MG PO TABS: 800.0000 mg | ORAL_TABLET | Freq: Three times a day (TID) | ORAL | 1 refills | Status: DC | PRN

## 2020-12-20 ENCOUNTER — Other Ambulatory Visit: Payer: Self-pay

## 2020-12-22 ENCOUNTER — Other Ambulatory Visit: Payer: Self-pay

## 2020-12-22 ENCOUNTER — Ambulatory Visit (INDEPENDENT_AMBULATORY_CARE_PROVIDER_SITE_OTHER): Payer: BC Managed Care – PPO | Admitting: Nurse Practitioner

## 2020-12-22 ENCOUNTER — Encounter: Payer: Self-pay | Admitting: Nurse Practitioner

## 2020-12-22 VITALS — BP 140/88 | HR 80 | Temp 97.1°F | Wt 183.0 lb

## 2020-12-22 DIAGNOSIS — M5441 Lumbago with sciatica, right side: Secondary | ICD-10-CM

## 2020-12-22 DIAGNOSIS — G8929 Other chronic pain: Secondary | ICD-10-CM | POA: Diagnosis not present

## 2020-12-22 DIAGNOSIS — M5442 Lumbago with sciatica, left side: Secondary | ICD-10-CM | POA: Diagnosis not present

## 2020-12-22 DIAGNOSIS — N390 Urinary tract infection, site not specified: Secondary | ICD-10-CM

## 2020-12-22 DIAGNOSIS — M545 Low back pain, unspecified: Secondary | ICD-10-CM | POA: Insufficient documentation

## 2020-12-22 LAB — POCT URINALYSIS DIPSTICK
Bilirubin, UA: NEGATIVE
Blood, UA: NEGATIVE
Glucose, UA: NEGATIVE
Ketones, UA: NEGATIVE
Nitrite, UA: POSITIVE
Protein, UA: NEGATIVE
Spec Grav, UA: 1.015 (ref 1.010–1.025)
Urobilinogen, UA: 0.2 E.U./dL
pH, UA: 8 (ref 5.0–8.0)

## 2020-12-22 MED ORDER — NITROFURANTOIN MONOHYD MACRO 100 MG PO CAPS: 100.0000 mg | ORAL_CAPSULE | Freq: Two times a day (BID) | ORAL | 0 refills | Status: AC

## 2020-12-22 NOTE — Progress Notes (Signed)
Subjective:  Patient ID: Cameron Rice, male    DOB: 09-Jun-1951  Age: 70 y.o. MRN: [id number]  CC: Acute Visit (Pt c/o cloudy urine and odor x 1 month. Pt states symptoms come and go. /Pt also c/o bilateral leg pain. )  Urinary Tract Infection  This is a recurrent problem. The current episode started more than 1 month ago. The problem occurs intermittently. The problem has been waxing and waning. The quality of the pain is described as burning. The pain is mild. There has been no fever. He is not sexually active. There is no history of pyelonephritis. Associated symptoms include frequency, hesitancy and urgency. Pertinent negatives include no chills, discharge, flank pain, hematuria, nausea, sweats or vomiting. He has tried antibiotics and increased fluids for the symptoms. The treatment provided significant relief. His past medical history is significant for recurrent UTIs and urinary stasis.  Back Pain This is a chronic problem. The current episode started more than 1 year ago. The problem occurs intermittently. The problem has been waxing and waning since onset. The pain is present in the lumbar spine. The quality of the pain is described as aching. The pain radiates to the left thigh, right thigh, left knee and right knee. The pain is moderate. Worse during: worse in morning. The symptoms are aggravated by lying down. Pertinent negatives include no bladder incontinence, bowel incontinence, numbness, paresis, paresthesias, perianal numbness, tingling, weakness or weight loss. (Stiffness last about 25mins in AM, improvement with movement and stretching) He has tried NSAIDs for the symptoms. The treatment provided significant relief.   upcoming appt with urology Urine culture in March grew E. Coli, treated with cipro and symptoms completed resolved per patient. Denies any constipation or diarrhea  Reviewed past Medical, Social and Family history today.  Outpatient Medications Prior to Visit   Medication Sig Dispense Refill  . atorvastatin (LIPITOR) 40 MG tablet TAKE 1 TABLET DAILY AT 6PM (NO ADDITIONAL REFILL WITHOUT OFFICE VISIT) 90 tablet 3  . Multiple Vitamins-Minerals (ONE-A-DAY MENS 50+ ADVANTAGE) TABS     . oxybutynin (DITROPAN-XL) 5 MG 24 hr tablet TAKE 1 TABLET DAILY    . potassium chloride SA (K-DUR) 20 MEQ tablet Take 2 tablets (40 mEq total) by mouth daily. 4 tablet 0  . sildenafil (REVATIO) 20 MG tablet Take 3-5 tablets daily prn    . tamsulosin (FLOMAX) 0.4 MG CAPS capsule     . amLODipine-benazepril (LOTREL) 10-20 MG capsule Take 1 capsule by mouth daily. 90 capsule 3  . ciprofloxacin (CIPRO) 500 MG tablet Take 1 tablet (500 mg total) by mouth 2 (two) times daily. (Patient not taking: Reported on 12/22/2020) 20 tablet 0  . ibuprofen (ADVIL) 800 MG tablet Take 1 tablet (800 mg total) by mouth every 8 (eight) hours as needed. (Patient not taking: Reported on 12/22/2020) 60 tablet 1   No facility-administered medications prior to visit.    ROS See HPI  Objective:  BP 140/88 (BP Location: Left Arm, Patient Position: Sitting, Cuff Size: Large)   Pulse 80   Temp (!) 97.1 F (36.2 C) (Temporal)   Wt 183 lb (83 kg)   SpO2 97%   BMI 25.52 kg/m   Physical Exam Vitals reviewed.  Cardiovascular:     Rate and Rhythm: Normal rate.     Pulses: Normal pulses.  Pulmonary:     Effort: Pulmonary effort is normal.  Abdominal:     General: Bowel sounds are normal.     Palpations: Abdomen is soft.  Musculoskeletal:        General: No tenderness.     Lumbar back: Normal. Negative right straight leg raise test and negative left straight leg raise test.     Right lower leg: No edema.     Left lower leg: No edema.  Neurological:     Mental Status: He is alert and oriented to person, place, and time.    Assessment & Plan:  This visit occurred during the SARS-CoV-2 public health emergency.  Safety protocols were in place, including screening questions prior to the visit,  additional usage of staff PPE, and extensive cleaning of exam room while observing appropriate contact time as indicated for disinfecting solutions.   Chick was seen today for acute visit.  Diagnoses and all orders for this visit:  Complicated UTI (urinary tract infection) -     POCT Urinalysis Dipstick -     Urine Culture -     nitrofurantoin, macrocrystal-monohydrate, (MACROBID) 100 MG capsule; Take 1 capsule (100 mg total) by mouth 2 (two) times daily for 10 days.  Chronic bilateral low back pain with bilateral sciatica  Maintain appt with [facility] Start oral abx Urine culture pending Continue use of ibuprofen as needed for back pain, take with food. Start daily stretching exercise.  Problem List Items Addressed This Visit      Nervous and Auditory   Chronic bilateral low back pain with bilateral sciatica     Genitourinary   Complicated UTI (urinary tract infection) - Primary   Relevant Medications   nitrofurantoin, macrocrystal-monohydrate, (MACROBID) 100 MG capsule   Other Relevant Orders   POCT Urinalysis Dipstick (Completed)   Urine Culture      Follow-up: Return in about 4 weeks (around 01/19/2021) for CPE (fasting).  Wilfred Lacy, NP

## 2020-12-22 NOTE — Patient Instructions (Signed)
Maintain appt with [facility] Start oral abx Urine culture pending Continue use of ibuprofen as needed for back pain, take with food. Start daily stretching exercise.  Managing Chronic Back Pain Chronic back pain is back pain that lasts for 12 weeks or longer. It often affects the lower back. Back pain may feel like a muscle ache or a sharp, stabbing pain. It can be mild, moderate, or severe. If you have been diagnosed with chronic back pain, there are things you can do to manage your symptoms. You may have to try different things to see what works best for you. Your health care provider may also give you specific instructions. How to manage lifestyle changes Treating chronic back pain often starts with rest and pain relief, followed by exercises to restore movement and strength to your back (physical therapy). You may need surgery if other treatments do not help, or if your pain is caused by a condition or an injury. Follow your treatment plan as told by your health care provider. This may include:  Relaxation techniques.  Talk therapy or counseling with a mental health specialist. A form of talk therapy called cognitive behavioral therapy (CBT) can be especially helpful. This therapy helps you set goals and follow up on the changes that you make.  Acupuncture or massage therapy.  Local electrical stimulation.  Injections. These deliver numbing or pain-relieving medicines into your spine or the area of pain. How to recognize changes in your chronic back pain Your condition may improve with treatment. However, back pain may not go away or may get worse over time. Watch your symptoms carefully and let your health care provider know if your symptoms get worse or do not improve. Your back pain may be getting worse if you have:  Pain that begins to cause problems with posture.  Pain that gets worse when you are sitting, standing, walking, bending, or lifting.  Pain that affects you while  you are active, or at rest, or both.  Pain that eventually makes it hard to move around (limits mobility).  Pain that occurs with fever, weight loss, or difficulty urinating.  Pain that causes numbness and tingling. How to use body mechanics and posture to help with pain Healthy body mechanics and good posture can help to relieve stress on your back. Body mechanics refers to the movements and positions of your body during your daily activities. Posture is part of body mechanics. Good posture means:  Your spine is in its natural S-curve, or neutral, position.  Your shoulders are pulled back slightly.  Your head is not tipped forward. Follow these guidelines to improve your posture and body mechanics in your everyday activities. Standing  When standing, keep your spine neutral and your feet about hip-width apart. Keep your knees slightly bent. Your ears, shoulders, and hips should line up.  When you do a task in which you stand in one place for a long time, place one foot on a stable object that is 2-4 inches (5-10 cm) high, such as a footstool. This helps keep your spine neutral.   Sitting  When sitting, keep your spine neutral and your feet flat on the floor. Use a footrest, if necessary, and keep your thighs parallel to the floor. Avoid rounding your shoulders, and avoid tilting your head forward.  When working at a desk or a computer, keep your desk at a height where your hands are slightly lower than your elbows. Slide your chair under your desk so you  are close enough to maintain good posture.  When working at a computer, place your monitor at a height where you are looking straight ahead and you do not have to tilt your head forward or downward to view the screen.   Lifting  Keep your feet at least shoulder-width apart and tighten the muscles of your abdomen.  Bend your knees and hips and keep your spine neutral. Be sure to lift using the strength of your legs, not your back. Do  not lock your knees straight out.  Always ask for help to lift heavy or awkward objects.   Resting  When lying down and resting, avoid positions that are most painful.  If you have pain with activities such as sitting, bending, stooping, or squatting, lie in a position in which your body does not bend very much. For example, avoid curling up on your side with your arms and knees near your chest (fetal position).  If you have pain with activities such as standing for a long time or reaching with your arms, lie with your spine in a neutral position and bend your knees slightly. Try: ? Lying on your side with a pillow between your knees. ? Lying on your back with a pillow under your knees.   Follow these instructions at home: Medicines  Treatment may include over-the-counter or prescription medicines for pain and inflammation that are taken by mouth or applied to the skin. Another treatment may include muscle relaxants. Take over-the-counter and prescription medicines only as told by your health care provider.  Ask your health care provider if the medicine prescribed to you: ? Requires you to avoid driving or using machinery. ? Can cause constipation. You may need to take these actions to prevent or treat constipation:  Drink enough fluid to keep your urine pale yellow.  Take over-the-counter or prescription medicines.  Eat foods that are high in fiber, such as beans, whole grains, and fresh fruits and vegetables.  Limit foods that are high in fat and processed sugars, such as fried or sweet foods. Lifestyle  Do not use any products that contain nicotine or tobacco, such as cigarettes, e-cigarettes, and chewing tobacco. If you need help quitting, ask your health care provider.  Eat a healthy diet that includes foods such as vegetables, fruits, fish, and lean meats.  Work with your health care provider to achieve or maintain a healthy weight. General instructions  Get regular  exercise as told. Exercise improves flexibility and strength.  If physical therapy was prescribed, do exercises as told by your health care provider.  Use ice or heat therapy as told by your health care provider.  Keep all follow-up visits as told by your health care provider. This is important. Where can I get support? Consider joining a support group for people managing chronic back pain. Ask your health care provider about support groups in your area. You can also find online and in-person support groups through:  The American Chronic Pain Association: theacpa.org  Pain Connection Program: painconnection.org Contact a health care provider if:  You have pain that is not relieved with rest or medicine.  Your pain gets worse, or you have new pain.  You have a fever.  You have rapid weight loss.  You have trouble doing your normal activities. Get help right away if:  You have weakness or numbness in one or both of your legs or feet.  You have trouble controlling your bladder or your bowels.  You have severe  back pain and have any of the following: ? Nausea or vomiting. ? Abdominal pain. ? Shortness of breath or you faint. Summary  Chronic back pain is often treated with rest, pain relief, and physical therapy.  Talk therapy, acupuncture, massage, and local electrical stimulation may help.  Follow your treatment plan as told by your health care provider.  Joining a support group may help you manage chronic back pain. This information is not intended to replace advice given to you by your health care provider. Make sure you discuss any questions you have with your health care provider. Document Revised: 09/04/2019 Document Reviewed: 05/13/2019 Elsevier Patient Education  [place].

## 2021-01-28 ENCOUNTER — Encounter: Payer: Self-pay | Admitting: Nurse Practitioner

## 2021-01-28 DIAGNOSIS — M5442 Lumbago with sciatica, left side: Secondary | ICD-10-CM

## 2021-01-28 DIAGNOSIS — G8929 Other chronic pain: Secondary | ICD-10-CM

## 2021-02-02 MED ORDER — PREDNISONE 20 MG PO TABS: ORAL_TABLET | ORAL | 0 refills | Status: AC

## 2021-02-02 NOTE — Telephone Encounter (Signed)
Please see message and advise.  Thank you. ° °

## 2021-02-20 ENCOUNTER — Encounter: Payer: Self-pay | Admitting: Nurse Practitioner

## 2021-02-20 DIAGNOSIS — I10 Essential (primary) hypertension: Secondary | ICD-10-CM

## 2021-02-21 MED ORDER — AMLODIPINE BESY-BENAZEPRIL HCL 10-20 MG PO CAPS: 1.0000 | ORAL_CAPSULE | Freq: Every day | ORAL | 0 refills | Status: DC

## 2021-03-07 ENCOUNTER — Other Ambulatory Visit: Payer: Self-pay

## 2021-03-08 ENCOUNTER — Ambulatory Visit (INDEPENDENT_AMBULATORY_CARE_PROVIDER_SITE_OTHER): Payer: BC Managed Care – PPO | Admitting: Nurse Practitioner

## 2021-03-08 ENCOUNTER — Encounter: Payer: Self-pay | Admitting: Nurse Practitioner

## 2021-03-08 VITALS — BP 110/70 | HR 72 | Temp 97.1°F | Ht 70.0 in | Wt 180.4 lb

## 2021-03-08 DIAGNOSIS — Z0000 Encounter for general adult medical examination without abnormal findings: Secondary | ICD-10-CM

## 2021-03-08 DIAGNOSIS — Z23 Encounter for immunization: Secondary | ICD-10-CM

## 2021-03-08 DIAGNOSIS — E7800 Pure hypercholesterolemia, unspecified: Secondary | ICD-10-CM | POA: Diagnosis not present

## 2021-03-08 DIAGNOSIS — I10 Essential (primary) hypertension: Secondary | ICD-10-CM | POA: Diagnosis not present

## 2021-03-08 DIAGNOSIS — M629 Disorder of muscle, unspecified: Secondary | ICD-10-CM

## 2021-03-08 DIAGNOSIS — M5441 Lumbago with sciatica, right side: Secondary | ICD-10-CM

## 2021-03-08 DIAGNOSIS — R350 Frequency of micturition: Secondary | ICD-10-CM

## 2021-03-08 DIAGNOSIS — N401 Enlarged prostate with lower urinary tract symptoms: Secondary | ICD-10-CM

## 2021-03-08 DIAGNOSIS — G8929 Other chronic pain: Secondary | ICD-10-CM

## 2021-03-08 LAB — LIPID PANEL
Cholesterol: 196 mg/dL (ref 0–200)
HDL: 48.3 mg/dL (ref >39.00)
LDL Cholesterol: 118 mg/dL — ABNORMAL HIGH (ref 0–99)
NonHDL: 148.04
Total CHOL/HDL Ratio: 4
Triglycerides: 148 mg/dL (ref 0.0–149.0)
VLDL: 29.6 mg/dL (ref 0.0–40.0)

## 2021-03-08 LAB — COMPREHENSIVE METABOLIC PANEL
ALT: 25 U/L (ref 0–53)
AST: 20 U/L (ref 0–37)
Albumin: 4.3 g/dL (ref 3.5–5.2)
Alkaline Phosphatase: 87 U/L (ref 39–117)
BUN: 14 mg/dL (ref 6–23)
CO2: 27 mEq/L (ref 19–32)
Calcium: 9.3 mg/dL (ref 8.4–10.5)
Chloride: 103 mEq/L (ref 96–112)
Creatinine, Ser: 0.68 mg/dL (ref 0.40–1.50)
GFR: 95.7 mL/min (ref >60.00)
Glucose, Bld: 90 mg/dL (ref 70–99)
Potassium: 4.3 mEq/L (ref 3.5–5.1)
Sodium: 138 mEq/L (ref 135–145)
Total Bilirubin: 0.8 mg/dL (ref 0.2–1.2)
Total Protein: 7.6 g/dL (ref 6.0–8.3)

## 2021-03-08 LAB — CBC
HCT: 41.9 % (ref 39.0–52.0)
Hemoglobin: 13.7 g/dL (ref 13.0–17.0)
MCHC: 32.7 g/dL (ref 30.0–36.0)
MCV: 95.1 fl (ref 78.0–100.0)
Platelets: 230 10*3/uL (ref 150.0–400.0)
RBC: 4.4 Mil/uL (ref 4.22–5.81)
RDW: 13.3 % (ref 11.5–15.5)
WBC: 5.2 10*3/uL (ref 4.0–10.5)

## 2021-03-08 LAB — TSH: TSH: 1.71 u[IU]/mL (ref 0.35–5.50)

## 2021-03-08 MED ORDER — METHOCARBAMOL 750 MG PO TABS: 750.0000 mg | ORAL_TABLET | Freq: Every evening | ORAL | 2 refills | Status: DC | PRN

## 2021-03-08 MED ORDER — AMLODIPINE BESY-BENAZEPRIL HCL 10-20 MG PO CAPS: 1.0000 | ORAL_CAPSULE | Freq: Every day | ORAL | 3 refills | Status: DC

## 2021-03-08 MED ORDER — MELOXICAM 7.5 MG PO TABS: 7.5000 mg | ORAL_TABLET | Freq: Every day | ORAL | 0 refills | Status: DC

## 2021-03-08 NOTE — Assessment & Plan Note (Signed)
Onset 1year ago, worsening in last 37month. Constant, worse when immobile and lifting. Denies any back injury or fall or MVA. No neurologic deficit or muscle weakness. No improvement with NSAIDs. Some improvement with oral prednisone. Exercise routine: walking and running 2-3x/week. (no pain with running). He does not typically stretch prior but he does warm by walking then jogging.  Exam today reveals tight hamstrings, unable to bend an touch toes without flexing his knees. Start mobic and robaxin Provided back and hamstring stretching exercise Entered PT referral.

## 2021-03-08 NOTE — Patient Instructions (Signed)
Have colonoscopy report faxed to me once completed.  Go to lab for blood draw.  Start back and hamstring stretch exercises while waiting for PT appt. Alternate between warm and cold compress as needed Stop ibuprofen and start mobic daily (with food) Use robaxin only at bedtime to avoid daytime drowsiness.  Hamstring Strain Rehab Ask your health care provider which exercises are safe for you. Do exercises exactly as told by your health care provider and adjust them as directed. It is normal to feel mild stretching, pulling, tightness, or discomfort as you do these exercises. Stop right away if you feel sudden pain or your pain gets worse. Do not begin these exercises until told by your health care provider. Stretching and range-of-motion exercises These exercises warm up your muscles and joints and improve the movement and flexibility of your thighs. These exercises also help to relieve pain, numbness, and tingling. Talk to your health care provider about theserestrictions. Knee extension, seated  Sit with your left / right heel propped on a chair, a coffee table, or a footstool. Do not have anything under your knee to support it. Allow your leg muscles to relax, letting gravity straighten out your knee (extension). You should feel a stretch behind your left / right knee. If told by your health care provider, deepen the stretch by placing a __________ weight on your thigh, just above your kneecap. Hold this position for __________ seconds. Repeat __________ times. Complete this exercise __________ times a day. Seated stretch This exercise is sometimes called hamstrings and adductors stretch. Sit on the floor with your legs stretched wide. Keep your knees straight during this exercise. Keeping your head and back in a straight line, bend at your waist to reach for your left foot (position A). You should feel a stretch in your right inner thigh (adductors). Hold this position for __________  seconds. Then slowly return to the upright position. Keeping your head and back in a straight line, bend at your waist to reach forward (position B). You should feel a stretch behind both of your thighs or knees (hamstrings). Hold this position for __________ seconds. Then slowly return to the upright position. Keeping your head and back in a straight line, bend at your waist to reach for your right foot (position C). You should feel a stretch in your left inner thigh (adductors). Hold this position for __________ seconds. Then slowly return to the upright position. Repeat __________ times. Complete this exercise __________ times a day. Hamstrings stretch, supine  Lie on your back (supine position). Loop a belt or towel over the ball of your left / right foot. The ball of your foot is on the walking surface, right under your toes. Straighten your left / right knee and slowly pull on the belt or towel to raise your leg. Do not let your left / right knee bend while you do this. Keep your other leg flat on the floor. Raise the left / right leg until you feel a gentle stretch behind your left / right knee or thigh (hamstrings). Hold this position for __________ seconds. Slowly return your leg to the starting position. Repeat __________ times. Complete this exercise __________ times a day. Strengthening exercises These exercises build strength and endurance in your thighs. Endurance is theability to use your muscles for a long time, even after they get tired. Straight leg raises, prone This exercise strengthens the muscles that move the hips (hip extensors). Lie on your abdomen on a firm surface (prone  position). Tense the muscles in your buttocks and lift your left / right leg about 4 inches (10 cm). Keep your knee straight as you lift your leg. If you cannot lift your leg that high without arching your back, place a pillow under your hips. Hold the position for __________ seconds. Slowly lower  your leg to the starting position. Allow your muscles to relax completely before you start the next repetition. Repeat __________ times. Complete this exercise __________ times a day. Bridge This exercise strengthens the muscles in your buttocks and the back of your thighs (hip extensors). Lie on your back on a firm surface with your knees bent and your feet flat on the floor. Tighten your buttocks muscles and lift your bottom off the floor until the trunk of your body is level with your thighs. You should feel the muscles working in your buttocks and the back of your thighs. Do not arch your back. Hold this position for __________ seconds. Slowly lower your hips to the starting position. Let your buttocks muscles relax completely between repetitions. If told by your health care provider, keep your bottom lifted off the floor while you slowly walk your feet away from you as far as you can control. Hold for __________ seconds, then slowly walk your feet back toward you. Repeat __________ times. Complete this exercise __________ times a day. Lateral walking with band This is an exercise in which you walk sideways (lateral), with tension provided by an exercise band. The exercise strengthens the muscles in your hip (hip abductors). Stand in a long hallway. Wrap a loop of exercise band around your legs, just above your knees. Bend your knees gently and drop your hips down and back so your weight is over your heels. Step to the side to move down the length of the hallway, keeping your toes pointed ahead of you and keeping tension in the band. Repeat, leading with your other leg. Repeat __________ times. Complete this exercise __________ times a day. Single leg stand with reaching This exercise is also called eccentric hamstring stretch. Stand on your left / right foot. Keep your big toe down on the floor and try to keep your arch lifted. Slowly reach down toward the floor as far as you can while  keeping your balance. Lowering your thigh under tension is called eccentric stretching. Hold this position for __________ seconds. Repeat __________ times. Complete this exercise __________ times a day. Plank, prone This exercise strengthens muscles in your abdomen and core area. Lie on your abdomen on the floor (prone position),and prop yourself up on your elbows. Your hands should be straight out in front of you, and your elbows should be below your shoulders. Position your feet similar to a push-up position so your toes are on the ground. Tighten your abdominal muscles and lift your body off the floor. Do not arch your back. Do not hold your breath. Hold this position for __________ seconds. Repeat __________ times. Complete this exercise __________ times a day. This information is not intended to replace advice given to you by your health care provider. Make sure you discuss any questions you have with your healthcare provider. Document Revised: 11/14/2018 Document Reviewed: 07/22/2018 Elsevier Patient Education  [place]. Back Exercises The following exercises strengthen the muscles that help to support the trunk and back. They also help to keep the lower back flexible. Doing these exercises can help to prevent back pain or lessen existing pain. If you have back pain or  discomfort, try doing these exercises 2-3 times each day or as told by your health care provider. As your pain improves, do them once each day, but increase the number of times that you repeat the steps for each exercise (do more repetitions). To prevent the recurrence of back pain, continue to do these exercises once each day or as told by your health care provider. Do exercises exactly as told by your health care provider and adjust them as directed. It is normal to feel mild stretching, pulling, tightness, or discomfort as you do these exercises, but you should stop right away if youfeel sudden pain or your pain  gets worse. Exercises Single knee to chest Repeat these steps 3-5 times for each leg: Lie on your back on a firm bed or the floor with your legs extended. Bring one knee to your chest. Your other leg should stay extended and in contact with the floor. Hold your knee in place by grabbing your knee or thigh with both hands and hold. Pull on your knee until you feel a gentle stretch in your lower back or buttocks. Hold the stretch for 10-30 seconds. Slowly release and straighten your leg. Pelvic tilt Repeat these steps 5-10 times: Lie on your back on a firm bed or the floor with your legs extended. Bend your knees so they are pointing toward the ceiling and your feet are flat on the floor. Tighten your lower abdominal muscles to press your lower back against the floor. This motion will tilt your pelvis so your tailbone points up toward the ceiling instead of pointing to your feet or the floor. With gentle tension and even breathing, hold this position for 5-10 seconds. Cat-cow Repeat these steps until your lower back becomes more flexible: Get into a hands-and-knees position on a firm surface. Keep your hands under your shoulders, and keep your knees under your hips. You may place padding under your knees for comfort. Let your head hang down toward your chest. Contract your abdominal muscles and point your tailbone toward the floor so your lower back becomes rounded like the back of a cat. Hold this position for 5 seconds. Slowly lift your head, let your abdominal muscles relax and point your tailbone up toward the ceiling so your back forms a sagging arch like the back of a cow. Hold this position for 5 seconds.  Press-ups Repeat these steps 5-10 times: Lie on your abdomen (face-down) on the floor. Place your palms near your head, about shoulder-width apart. Keeping your back as relaxed as possible and keeping your hips on the floor, slowly straighten your arms to raise the top half of your  body and lift your shoulders. Do not use your back muscles to raise your upper torso. You may adjust the placement of your hands to make yourself more comfortable. Hold this position for 5 seconds while you keep your back relaxed. Slowly return to lying flat on the floor.  Bridges Repeat these steps 10 times: Lie on your back on a firm surface. Bend your knees so they are pointing toward the ceiling and your feet are flat on the floor. Your arms should be flat at your sides, next to your body. Tighten your buttocks muscles and lift your buttocks off the floor until your waist is at almost the same height as your knees. You should feel the muscles working in your buttocks and the back of your thighs. If you do not feel these muscles, slide your feet 1-2 inches  farther away from your buttocks. Hold this position for 3-5 seconds. Slowly lower your hips to the starting position, and allow your buttocks muscles to relax completely. If this exercise is too easy, try doing it with your arms crossed over yourchest. Abdominal crunches Repeat these steps 5-10 times: Lie on your back on a firm bed or the floor with your legs extended. Bend your knees so they are pointing toward the ceiling and your feet are flat on the floor. Cross your arms over your chest. Tip your chin slightly toward your chest without bending your neck. Tighten your abdominal muscles and slowly raise your trunk (torso) high enough to lift your shoulder blades a tiny bit off the floor. Avoid raising your torso higher than that because it can put too much stress on your low back and does not help to strengthen your abdominal muscles. Slowly return to your starting position. Back lifts Repeat these steps 5-10 times: Lie on your abdomen (face-down) with your arms at your sides, and rest your forehead on the floor. Tighten the muscles in your legs and your buttocks. Slowly lift your chest off the floor while you keep your hips pressed  to the floor. Keep the back of your head in line with the curve in your back. Your eyes should be looking at the floor. Hold this position for 3-5 seconds. Slowly return to your starting position. Contact a health care provider if: Your back pain or discomfort gets much worse when you do an exercise. Your worsening back pain or discomfort does not lessen within 2 hours after you exercise. If you have any of these problems, stop doing these exercises right away. Do not do them again unless your health care provider says that you can. Get help right away if: You develop sudden, severe back pain. If this happens, stop doing the exercises right away. Do not do them again unless your health care provider says that you can. This information is not intended to replace advice given to you by your health care provider. Make sure you discuss any questions you have with your healthcare provider. Document Revised: 11/28/2018 Document Reviewed: 04/25/2018 Elsevier Patient Education  [place].

## 2021-03-08 NOTE — Progress Notes (Signed)
Subjective:    Patient ID: Cameron Rice, male    DOB: 1951-04-07, 70 y.o.   MRN: [id number]  Patient presents today for CPE and eval of chronic conditions  HPI BPH (benign prostatic hyperplasia) Followed by [facility] urology Last PSA level completed 12/2020: 1.4  Chronic right-sided low back pain Onset 1year ago, worsening in last 80month. Constant, worse when immobile and lifting. Denies any back injury or fall or MVA. No neurologic deficit or muscle weakness. No improvement with NSAIDs. Some improvement with oral prednisone. Exercise routine: walking and running 2-3x/week. (no pain with running). He does not typically stretch prior but he does warm by walking then jogging.  Exam today reveals tight hamstrings, unable to bend an touch toes without flexing his knees. Start mobic and robaxin Provided back and hamstring stretching exercise Entered PT referral.   Essential hypertension BP at goal BP Readings from Last 3 Encounters:  03/08/21 110/70  12/22/20 140/88  10/12/20 120/78    Maintain current dose  Pure hypercholesterolemia Repeat lipid panel   He agreed to second shingrix vaccine.  Vision:up to date Dental:up to date Diet: heart health Exercise:walking and running Weight:  Wt Readings from Last 3 Encounters:  03/08/21 180 lb 6.4 oz (81.8 kg)  12/22/20 183 lb (83 kg)  10/12/20 186 lb 6.4 oz (84.6 kg)    Sexual History (orientation,birth control, marital status, STD):prostate exam performed by urology, denies need for STD screen  Depression/Suicide[facility] 07/22/2020 12/09/2019 12/10/2018 12/28/2017 08/29/2017  Decreased Interest 0 0 0 0 0  Down, Depressed, Hopeless 0 0 0 0 0  PHQ - 2 Score 0 0 0 0 0   Immunizations: (TDAP, Hep C screen, Pneumovax, Influenza, zoster)  Health Maintenance  Topic Date Due   Zoster (Shingles) Vaccine (2 of 2) 08/04/2020   COVID-19 Vaccine (4 - Booster for Moderna series) 10/16/2020   Colon Cancer Screening   02/08/2021   Flu Shot  04/07/2021*   Tetanus Vaccine  11/29/2025   Hepatitis C Screening: USPSTF Recommendation to screen - Ages 18-79 yo.  Completed   Pneumonia vaccines  Completed   HPV Vaccine  Aged Out  *Topic was postponed. The date shown is not the original due date.   Fall Risk: Fall Risk  07/22/2020 12/09/2019 12/10/2018 12/28/2017 08/29/2017  Falls in the past year? 0 0 0 No No  Number falls in past yr: - 0 - - -  Injury with Fall? - 0 - - -   Advanced Directive: Advanced Directives 12/01/2017  Does Patient Have a Medical Advance Directive? No    Medications and allergies reviewed with patient and updated if appropriate.  Patient Active Problem List   Diagnosis Date Noted   Chronic right-sided low back pain 12/22/2020   Hx of colonic polyp 12/10/2018   AC joint arthropathy 11/26/2018   Need for prophylactic vaccination against Streptococcus pneumoniae (pneumococcus) and influenza [id number]  Complicated UTI (urinary tract infection) 08/29/2017   Erectile disorder due to medical condition in male 08/29/2017   Shift work sleep disorder 09/04/2013   Essential hypertension 09/06/2011   BPH (benign prostatic hyperplasia) 09/06/2011   Pure hypercholesterolemia 09/06/2011   Hypogonadism male 09/06/2011    Current Outpatient Medications on File Prior to Visit  Medication Sig Dispense Refill   atorvastatin (LIPITOR) 40 MG tablet TAKE 1 TABLET DAILY AT 6PM (NO ADDITIONAL REFILL WITHOUT OFFICE VISIT) 90 tablet 3   Multiple Vitamins-Minerals (ONE-A-DAY MENS 50+ ADVANTAGE) TABS  oxybutynin (DITROPAN-XL) 5 MG 24 hr tablet TAKE 1 TABLET DAILY     potassium chloride SA (K-DUR) 20 MEQ tablet Take 2 tablets (40 mEq total) by mouth daily. 4 tablet 0   sildenafil (REVATIO) 20 MG tablet Take 3-5 tablets daily prn     tamsulosin (FLOMAX) 0.4 MG CAPS capsule      No current facility-administered medications on file prior to visit.    Past Medical History:  Diagnosis Date    Hyperlipidemia    Hypertension    UTI (urinary tract infection)     Past Surgical History:  Procedure Laterality Date   URETHRA SURGERY     went in clean make sure urethra is clear    Social History   Socioeconomic History   Marital status: Married    Spouse name: Not on file   Number of children: Not on file   Years of education: Not on file   Highest education level: Not on file  Occupational History   Not on file  Tobacco Use   Smoking status: Never   Smokeless tobacco: Never  Vaping Use   Vaping Use: Never used  Substance and Sexual Activity   Alcohol use: No   Drug use: No   Sexual activity: Yes  Other Topics Concern   Not on file  Social History Narrative   Not on file   Social Determinants of Health   Financial Resource Strain: Not on file  Food Insecurity: Not on file  Transportation Needs: Not on file  Physical Activity: Not on file  Stress: Not on file  Social Connections: Not on file    Family History  Problem Relation Age of Onset   Cancer Mother    Cancer Brother        liver cancer   Cancer Maternal Aunt        lung       Review of Systems  Constitutional:  Negative for fever, malaise/fatigue and weight loss.  HENT:  Negative for congestion and sore throat.   Eyes:        Negative for visual changes  Respiratory:  Negative for cough and shortness of breath.   Cardiovascular:  Negative for chest pain, palpitations and leg swelling.  Gastrointestinal:  Negative for blood in stool, constipation, diarrhea and heartburn.  Genitourinary:  Negative for dysuria, frequency and urgency.  Musculoskeletal:  Positive for back pain. Negative for falls, joint pain and myalgias.  Skin:  Negative for rash.  Neurological:  Negative for dizziness, sensory change and headaches.  Endo/Heme/Allergies:  Does not bruise/bleed easily.  Psychiatric/Behavioral:  Negative for depression, substance abuse and suicidal ideas. The patient is not nervous/anxious.     Objective:   Vitals:   03/08/21 0938  BP: 110/70  Pulse: 72  Temp: (!) 97.1 F (36.2 C)  SpO2: 97%    Body mass index is 25.88 kg/m.   Physical Examination:  Physical Exam Vitals reviewed.  Constitutional:      General: He is not in acute distress.    Appearance: He is well-developed.  HENT:     Right Ear: Tympanic membrane, ear canal and external ear normal.     Left Ear: Tympanic membrane, ear canal and external ear normal.  Eyes:     Extraocular Movements: Extraocular movements intact.     Conjunctiva/sclera: Conjunctivae normal.  Cardiovascular:     Rate and Rhythm: Normal rate and regular rhythm.     Pulses: Normal pulses.     Heart sounds:  Normal heart sounds.  Pulmonary:     Effort: Pulmonary effort is normal. No respiratory distress.     Breath sounds: Normal breath sounds.  Chest:     Chest wall: No tenderness.  Abdominal:     General: Bowel sounds are normal.     Palpations: Abdomen is soft.  Musculoskeletal:        General: Tenderness present. No swelling, deformity or signs of injury.     Cervical back: Normal range of motion and neck supple.     Lumbar back: No spasms or tenderness. Negative right straight leg raise test and negative left straight leg raise test.     Right hip: Normal.     Left hip: Normal.     Right knee: No swelling, deformity, effusion or bony tenderness. Decreased range of motion. No tenderness.     Left knee: No swelling, deformity, effusion or bony tenderness. Decreased range of motion. No tenderness.     Right lower leg: Normal. No edema.     Left lower leg: Normal. No edema.  Lymphadenopathy:     Cervical: No cervical adenopathy.  Skin:    Findings: No erythema or rash.  Neurological:     Mental Status: He is alert and oriented to person, place, and time.     Deep Tendon Reflexes: Reflexes are normal and symmetric.  Psychiatric:        Mood and Affect: Mood normal.        Behavior: Behavior normal.        Thought  Content: Thought content normal.   ASSESSMENT and PLAN: This visit occurred during the SARS-CoV-2 public health emergency.  Safety protocols were in place, including screening questions prior to the visit, additional usage of staff PPE, and extensive cleaning of exam room while observing appropriate contact time as indicated for disinfecting solutions.   [facility] was seen today for annual exam.  Diagnoses and all orders for this visit:  Preventative health care -     CBC -     Comprehensive metabolic panel -     TSH  Essential hypertension -     amLODipine-benazepril (LOTREL) 10-20 MG capsule; Take 1 capsule by mouth daily.  Benign prostatic hyperplasia with urinary frequency  Pure hypercholesterolemia -     Lipid panel  Chronic right-sided low back pain with right-sided sciatica -     meloxicam (MOBIC) 7.5 MG tablet; Take 1 tablet (7.5 mg total) by mouth daily. -     methocarbamol (ROBAXIN) 750 MG tablet; Take 1 tablet (750 mg total) by mouth at bedtime as needed for muscle spasms. -     Ambulatory referral to Physical Therapy  Hamstring tightness of both lower extremities -     meloxicam (MOBIC) 7.5 MG tablet; Take 1 tablet (7.5 mg total) by mouth daily. -     methocarbamol (ROBAXIN) 750 MG tablet; Take 1 tablet (750 mg total) by mouth at bedtime as needed for muscle spasms. -     Ambulatory referral to Physical Therapy  Have colonoscopy report faxed to me once completed. Start back and hamstring stretch exercises while waiting for PT appt. Alternate between warm and cold compress as needed Stop ibuprofen and start mobic daily (with food) Use robaxin only at bedtime to avoid daytime drowsiness.    Problem List Items Addressed This Visit       Cardiovascular and Mediastinum   Essential hypertension    BP at goal BP Readings from Last 3 Encounters:  03/08/21 110/70  12/22/20 140/88  10/12/20 120/78    Maintain current dose      Relevant Medications    amLODipine-benazepril (LOTREL) 10-20 MG capsule     Genitourinary   BPH (benign prostatic hyperplasia)    Followed by [facility] urology Last PSA level completed 12/2020: 1.4         Other   Chronic right-sided low back pain    Onset 1year ago, worsening in last 52month. Constant, worse when immobile and lifting. Denies any back injury or fall or MVA. No neurologic deficit or muscle weakness. No improvement with NSAIDs. Some improvement with oral prednisone. Exercise routine: walking and running 2-3x/week. (no pain with running). He does not typically stretch prior but he does warm by walking then jogging.  Exam today reveals tight hamstrings, unable to bend an touch toes without flexing his knees. Start mobic and robaxin Provided back and hamstring stretching exercise Entered PT referral.        Relevant Medications   meloxicam (MOBIC) 7.5 MG tablet   methocarbamol (ROBAXIN) 750 MG tablet   Other Relevant Orders   Ambulatory referral to Physical Therapy   Pure hypercholesterolemia    Repeat lipid panel       Relevant Medications   amLODipine-benazepril (LOTREL) 10-20 MG capsule   Other Relevant Orders   Lipid panel   Other Visit Diagnoses     Preventative health care    -  Primary   Relevant Orders   CBC   Comprehensive metabolic panel   TSH   Hamstring tightness of both lower extremities       Relevant Medications   meloxicam (MOBIC) 7.5 MG tablet   methocarbamol (ROBAXIN) 750 MG tablet   Other Relevant Orders   Ambulatory referral to Physical Therapy       Follow up: Return in about 6 months (around 09/08/2021) for HTN and , hyperlipidemia (fasting).  CWilfred Lacy NP

## 2021-03-08 NOTE — Assessment & Plan Note (Signed)
Followed by [facility] urology Last PSA level completed 12/2020: 1.4

## 2021-03-08 NOTE — Addendum Note (Signed)
Addended by: Marchia Bond on: 03/08/2021 11:30 AM   Modules accepted: Orders

## 2021-03-08 NOTE — Assessment & Plan Note (Signed)
BP at goal BP Readings from Last 3 Encounters:  03/08/21 110/70  12/22/20 140/88  10/12/20 120/78    Maintain current dose

## 2021-03-08 NOTE — Assessment & Plan Note (Signed)
Repeat lipid panel ?

## 2021-03-18 ENCOUNTER — Ambulatory Visit: Payer: BC Managed Care – PPO | Attending: Nurse Practitioner | Admitting: Physical Therapy

## 2021-03-18 ENCOUNTER — Other Ambulatory Visit: Payer: Self-pay

## 2021-03-18 ENCOUNTER — Encounter: Payer: Self-pay | Admitting: Physical Therapy

## 2021-03-18 DIAGNOSIS — M5442 Lumbago with sciatica, left side: Secondary | ICD-10-CM | POA: Insufficient documentation

## 2021-03-18 DIAGNOSIS — R293 Abnormal posture: Secondary | ICD-10-CM | POA: Diagnosis present

## 2021-03-18 DIAGNOSIS — G8929 Other chronic pain: Secondary | ICD-10-CM | POA: Diagnosis present

## 2021-03-18 DIAGNOSIS — R262 Difficulty in walking, not elsewhere classified: Secondary | ICD-10-CM

## 2021-03-18 DIAGNOSIS — M6281 Muscle weakness (generalized): Secondary | ICD-10-CM | POA: Diagnosis present

## 2021-03-18 DIAGNOSIS — M5441 Lumbago with sciatica, right side: Secondary | ICD-10-CM | POA: Insufficient documentation

## 2021-03-18 NOTE — Therapy (Signed)
[address] [place], [place], [address] Phone: [phone]   Fax:  [phone]  Physical Therapy Evaluation  Patient Details  Name: Cameron Rice MRN: [id number] Date of Birth: July 22, 1951 Referring Provider (PT): Nche, Charlene Brooke, NP   Encounter Date: 03/18/2021   PT End of Session - 03/18/21 0914     Visit Number 1    Number of Visits 13    Date for PT Re-Evaluation 04/29/21    Authorization Type [employer]    PT Start Time 0834    PT Stop Time 0907    PT Time Calculation (min) 33 min    Activity Tolerance Patient tolerated treatment well    Behavior During Therapy [facility] for tasks assessed/performed             Past Medical History:  Diagnosis Date   Hyperlipidemia    Hypertension    UTI (urinary tract infection)     Past Surgical History:  Procedure Laterality Date   URETHRA SURGERY     went in clean make sure urethra is clear    There were no vitals filed for this visit.    Subjective Assessment - 03/18/21 0835     Subjective Patient reports R>L posterior thigh pain for several years, worsening for the past year. Denies changes in activity or injury. Pain is located over B posterior thighs, recently radiating below the knee. Denies N/T or B&B changes. Denies back pain. Worse with laying on his belly, getting up from a chair after prolonged sitting, difficulty standing/walking up straight. Better with activity. Pain also worse in the AM. Patient would like to return back to running- currently running a couple miles at a time, would like to return to 6 miles.    Pertinent History HLD, HTN    Limitations Sitting;Lifting;Standing;Walking;House hold activities    How long can you sit comfortably? varies    How long can you stand comfortably? varies    How long can you walk comfortably? varies    Diagnostic tests none recent    Patient Stated Goals decrease pain    Currently in Pain? Yes    Pain  Score 0-No pain    Pain Location Leg   thighs   Pain Orientation Posterior    Pain Descriptors / Indicators Aching;Sharp    Pain Type Chronic pain                [facility] PT Assessment - 03/18/21 0840       Assessment   Medical Diagnosis Chronic right-sided low back pain with right-sided sciatica; Hamstring tightness of both lower extremities    Referring Provider (PT) Nche, Charlene Brooke, NP    Onset Date/Surgical Date 03/18/20    Next MD Visit not scheduled    Prior Therapy no      Precautions   Precautions None      Balance Screen   Has the patient fallen in the past 6 months No    Has the patient had a decrease in activity level because of a fear of falling?  No    Is the patient reluctant to leave their home because of a fear of falling?  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, stooping, kneeling, laying on back    Leisure running- currently running a couple miles at a time, would like to return to 6 miles      Cognition   Overall Cognitive Status Within Functional Limits for tasks assessed      Sensation   Light Touch Appears Intact      Coordination   Gross Motor Movements are Fluid and Coordinated Yes      Posture/Postural Control   Posture/Postural Control Postural limitations    Postural Limitations Rounded Shoulders;Flexed trunk      ROM / Strength   AROM / PROM / Strength AROM;Strength      AROM   AROM Assessment Site Lumbar    Lumbar Flexion toes   knees bent   Lumbar Extension moderately limited   discomfort/stiff   Lumbar - Right Side Bend distal thigh   R LE pain   Lumbar - Left Side Bend jt  line    Lumbar - Right Rotation moderately limited   R LE pain   Lumbar - Left Rotation mildly limited      Strength   Strength Assessment Site Hip;Knee;Ankle    Right/Left Hip Right;Left    Right Hip Flexion 4/5    Right Hip ABduction 4-/5    Right Hip ADduction 4-/5    Left Hip Flexion 4/5    Left Hip ABduction 4-/5    Left Hip ADduction 4-/5    Right/Left Knee Right;Left    Right Knee Flexion 4+/5    Right Knee Extension 5/5    Left Knee Flexion 4/5    Left Knee Extension 5/5    Right/Left Ankle Right;Left    Right Ankle Dorsiflexion 4+/5    Right Ankle Plantar Flexion 4+/5   20 reps, limited ROM and heavy UE support   Left Ankle Dorsiflexion 4+/5    Left Ankle Plantar Flexion 4+/5   20 reps, limited ROM and heavy UE support     Flexibility   Soft Tissue Assessment /Muscle Length yes    Hamstrings B moderately limited    Piriformis L severely, R mod/severely tight in fig 4 and KTOS      Palpation   Palpation comment TTP over R lateral glutes and piriformis; soft tissue restriction in B lumbar paraspinals, QL, B glutes and piriformis, L HS      Ambulation/Gait   Gait Pattern Step-through pattern;Trunk flexed    Ambulation Surface Level;Indoor    Gait velocity WFL                        Objective measurements completed on examination: See above findings.               PT Education - 03/18/21 0914     Education Details prognosis, POC, HEP    Person(s) Educated Patient    Methods Explanation;Demonstration;Tactile cues;Verbal cues;Handout    Comprehension Verbalized understanding              PT Short Term Goals - 03/18/21 0920       PT SHORT TERM GOAL #1   Title Patient to be independent with initial HEP.    Time 3    Period Weeks    Status New    Target Date 04/08/21  PT Long Term Goals - 03/18/21 [id number]       PT LONG TERM GOAL #1   Title Patient to be independent with advanced HEP.    Time 6    Period  Weeks    Status New    Target Date 04/29/21      PT LONG TERM GOAL #2   Title Patient to demonstrate lumbar ROM WFL and without pain limiting.    Time 6    Period Weeks    Status New    Target Date 04/29/21      PT LONG TERM GOAL #3   Title Patient to demonstrate B LE strength >/=4+/5.    Time 6    Period Weeks    Status New    Target Date 04/29/21      PT LONG TERM GOAL #4   Title Patient to demonstrate mild-moderate B piriformis tightness with stretching remaining.    Time 6    Period Weeks    Status New    Target Date 04/29/21      PT LONG TERM GOAL #5   Title Patient to report 70% improvement in pain.    Time 6    Period Weeks    Status New    Target Date 04/29/21      Additional Long Term Goals   Additional Long Term Goals Yes      PT LONG TERM GOAL #6   Title Patient to report tolerance for 5 mile run without pain limiting.    Time 6    Period Weeks    Status New    Target Date 04/29/21                    Plan - 03/18/21 0915     Clinical Impression Statement Patient is a 70 y/o M presenting to OPPT with c/o chronic progressive B posterior thigh pain worsening for the past year. Notes recent radiation below the knee. Denies N/T, B&B changes, or back pain. Worse in AM, laying prone, getting up from a chair after prolonged sitting, and notes difficulty standing/walking up straight. Patient has an active job and would like to return to running several miles at a time for exercise. Patient today presenting with rounded shoulders with trunk flexed, limited and painful lumbar AROM, decreased B hip strength, limited B HS and piriformis flexibility, TTP over R lateral glutes and piriformis, soft tissue restriction in B lumbar paraspinals, QL, B glutes and piriformis, L HS, and gait deviations. Patient did c/o onset of B LBP after performing standing PF MMT today. Patient was educated on gentle stretching and strengthening HEP and reported understanding. Would  benefit from skilled PT services 1-2x/week for 6 weeks to address aforementioned impairments.    Personal Factors and Comorbidities Age;Fitness;Past/Current Experience;Comorbidity 2;Profession;Time since onset of injury/illness/exacerbation    Comorbidities HLD, HTN    Examination-Activity Limitations Bed Mobility;Reach Overhead;Transfers;Bend;Sit;Carry;Sleep;Squat;Stand;Stairs;Lift    Examination-Participation Restrictions [name];Shop;Driving;Community Activity;Cleaning;Occupation;Church;Meal Prep   running   Stability/Clinical Decision Making Evolving/Moderate complexity    Clinical Decision Making Moderate    Rehab Potential Good    PT Frequency --   1-2x   PT Duration 6 weeks    PT Treatment/Interventions ADLs/Self Care Home Management;Cryotherapy;Electrical Stimulation;Ultrasound;Traction;Moist Heat;Gait training;Stair training;Functional mobility training;Therapeutic activities;Therapeutic exercise;Balance training;Neuromuscular re-education;Manual techniques;Patient/family education;Passive range of motion;Dry needling;Energy conservation;Taping    PT Next Visit Plan lumbar FOTO; reassess HEP; progress hip strengthening and stretching    Consulted and Agree with Plan of Care Patient  Patient will benefit from skilled therapeutic intervention in order to improve the following deficits and impairments:  Abnormal gait, Decreased range of motion, Difficulty walking, Increased fascial restricitons, Increased muscle spasms, Decreased safety awareness, Decreased activity tolerance, Pain, Improper body mechanics, Impaired flexibility, Hypomobility, Decreased strength, Postural dysfunction  Visit Diagnosis: Chronic bilateral low back pain with bilateral sciatica  Muscle weakness (generalized)  Abnormal posture  Difficulty in walking, not elsewhere classified     Problem List Patient Active Problem List   Diagnosis Date Noted   Chronic right-sided low back pain  12/22/2020   Hx of colonic polyp 12/10/2018   AC joint arthropathy 11/26/2018   Need for prophylactic vaccination against Streptococcus pneumoniae (pneumococcus) and influenza [id number]   Complicated UTI (urinary tract infection) 08/29/2017   Erectile disorder due to medical condition in male 08/29/2017   Shift work sleep disorder 09/04/2013   Essential hypertension 09/06/2011   BPH (benign prostatic hyperplasia) 09/06/2011   Pure hypercholesterolemia 09/06/2011   Hypogonadism male 09/06/2011    Janene Harvey, PT, DPT 03/18/21 9:25 AM   [address]. [place], [place], [address] Phone: [phone]   Fax:  [phone]  Name: Cameron Rice MRN: [id number] Date of Birth: [birth year]

## 2021-03-18 NOTE — Patient Instructions (Signed)
Access Code: [email or web address] URL: [email or web address] Date: 03/18/2021 Prepared by: Grayling Congress  Exercises Supine Piriformis Stretch with Foot on Ground - 2 x daily - 7 x weekly - 2 sets - 30 sec hold Supine Figure 4 Piriformis Stretch - 2 x daily - 7 x weekly - 2 sets - 30 sec hold Supine Bridge with Resistance Band - 2 x daily - 7 x weekly - 2 sets - 10 reps Sidelying Hip Abduction - 2 x daily - 7 x weekly - 2 sets - 10 reps Sidelying Hip Adduction - 2 x daily - 7 x weekly - 2 sets - 10 reps

## 2021-03-24 ENCOUNTER — Other Ambulatory Visit: Payer: Self-pay

## 2021-03-24 ENCOUNTER — Encounter: Payer: Self-pay | Admitting: Physical Therapy

## 2021-03-24 ENCOUNTER — Ambulatory Visit: Payer: BC Managed Care – PPO | Admitting: Physical Therapy

## 2021-03-24 DIAGNOSIS — G8929 Other chronic pain: Secondary | ICD-10-CM

## 2021-03-24 DIAGNOSIS — M5442 Lumbago with sciatica, left side: Secondary | ICD-10-CM | POA: Diagnosis not present

## 2021-03-24 DIAGNOSIS — R293 Abnormal posture: Secondary | ICD-10-CM

## 2021-03-24 DIAGNOSIS — R262 Difficulty in walking, not elsewhere classified: Secondary | ICD-10-CM

## 2021-03-24 DIAGNOSIS — M6281 Muscle weakness (generalized): Secondary | ICD-10-CM

## 2021-03-24 NOTE — Therapy (Signed)
[place]. [place], [place], [address] Phone: [phone]   Fax:  [phone]  Physical Therapy Treatment  Patient Details  Name: Cameron Rice MRN: [id number] Date of Birth: 07/12/1951 Referring Provider (PT): Nche, Charlene Brooke, NP   Encounter Date: 03/24/2021   PT End of Session - 03/24/21 0927     Visit Number 2    Number of Visits 13    Date for PT Re-Evaluation 04/29/21    Authorization Type BCBS    PT Start Time 0845    PT Stop Time 0930    PT Time Calculation (min) 45 min    Activity Tolerance Patient tolerated treatment well    Behavior During Therapy [facility] for tasks assessed/performed             Past Medical History:  Diagnosis Date   Hyperlipidemia    Hypertension    UTI (urinary tract infection)     Past Surgical History:  Procedure Laterality Date   URETHRA SURGERY     went in clean make sure urethra is clear    There were no vitals filed for this visit.   Subjective Assessment - 03/24/21 0844     Subjective Some improvement with the stretching. "Seems like Im tight and I cant get un tight"    Currently in Pain? Yes    Pain Score 2     Pain Location Knee    Pain Orientation Right                               [facility] Adult PT Treatment/Exercise - 03/24/21 0001       Exercises   Exercises Lumbar      Lumbar Exercises: Stretches   Passive Hamstring Stretch Left;Right;3 reps;20 seconds    Single Knee to Chest Stretch Left;Right;3 reps;10 seconds    Piriformis Stretch Right;Left;3 reps;20 seconds      Lumbar Exercises: Aerobic   UBE (Upper Arm Bike) L2.1 x2 min    Recumbent Bike L2.5 x 6 min      Lumbar Exercises: Machines for Strengthening   Other Lumbar Machine Exercise Rows & Lats 25lb 2x10      Lumbar Exercises: Standing   Shoulder Extension Strengthening;Power Tower;20 reps    Shoulder Extension Limitations 10    Other Standing Lumbar  Exercises Overhead Ext yelow ball 2x10      Lumbar Exercises: Supine   Bridge Compliant;20 reps;2 seconds    Other Supine Lumbar Exercises LE on Pball bridges oblq, K2c                      PT Short Term Goals - 03/18/21 0920       PT SHORT TERM GOAL #1   Title Patient to be independent with initial HEP.    Time 3    Period Weeks    Status New    Target Date 04/08/21               PT Long Term Goals - 03/18/21 0921       PT LONG TERM GOAL #1   Title Patient to be independent with advanced HEP.    Time 6    Period Weeks    Status New    Target Date 04/29/21      PT LONG TERM GOAL #2   Title Patient to demonstrate lumbar ROM WFL and without  pain limiting.    Time 6    Period Weeks    Status New    Target Date 04/29/21      PT LONG TERM GOAL #3   Title Patient to demonstrate B LE strength >/=4+/5.    Time 6    Period Weeks    Status New    Target Date 04/29/21      PT LONG TERM GOAL #4   Title Patient to demonstrate mild-moderate B piriformis tightness with stretching remaining.    Time 6    Period Weeks    Status New    Target Date 04/29/21      PT LONG TERM GOAL #5   Title Patient to report 70% improvement in pain.    Time 6    Period Weeks    Status New    Target Date 04/29/21      Additional Long Term Goals   Additional Long Term Goals Yes      PT LONG TERM GOAL #6   Title Patient to report tolerance for 5 mile run without pain limiting.    Time 6    Period Weeks    Status New    Target Date 04/29/21                   Plan - 03/24/21 0927     Clinical Impression Statement Pt tolerated an initial progression to TE well. Pt has a forward flexed posture at rest. Tactile cue needed to maintain good posture with standing shoulder Ext. Some initial difficulty with overhead lumbar Ext but ROM improved as reps progressed. Some HS discomfort noted with supine bridges. Pt is very tight in both HS and piriformis muscles.     Personal Factors and Comorbidities Age;Fitness;Past/Current Experience;Comorbidity 2;Profession;Time since onset of injury/illness/exacerbation    Comorbidities HLD, HTN    Examination-Activity Limitations Bed Mobility;Reach Overhead;Transfers;Bend;Sit;Carry;Sleep;Squat;Stand;Stairs;Lift    Examination-Participation Restrictions [name];Shop;Driving;Community Activity;Cleaning;Occupation;Church;Meal Prep    Stability/Clinical Decision Making Evolving/Moderate complexity    Rehab Potential Good    PT Duration 6 weeks    PT Treatment/Interventions ADLs/Self Care Home Management;Cryotherapy;Electrical Stimulation;Ultrasound;Traction;Moist Heat;Gait training;Stair training;Functional mobility training;Therapeutic activities;Therapeutic exercise;Balance training;Neuromuscular re-education;Manual techniques;Patient/family education;Passive range of motion;Dry needling;Energy conservation;Taping    PT Next Visit Plan reassess HEP; progress hip strengthening and stretching             Patient will benefit from skilled therapeutic intervention in order to improve the following deficits and impairments:  Abnormal gait, Decreased range of motion, Difficulty walking, Increased fascial restricitons, Increased muscle spasms, Decreased safety awareness, Decreased activity tolerance, Pain, Improper body mechanics, Impaired flexibility, Hypomobility, Decreased strength, Postural dysfunction  Visit Diagnosis: Muscle weakness (generalized)  Abnormal posture  Difficulty in walking, not elsewhere classified  Chronic bilateral low back pain with bilateral sciatica     Problem List Patient Active Problem List   Diagnosis Date Noted   Chronic right-sided low back pain 12/22/2020   Hx of colonic polyp 12/10/2018   AC joint arthropathy 11/26/2018   Need for prophylactic vaccination against Streptococcus pneumoniae (pneumococcus) and influenza [id number]   Complicated UTI (urinary tract  infection) 08/29/2017   Erectile disorder due to medical condition in male 08/29/2017   Shift work sleep disorder 09/04/2013   Essential hypertension 09/06/2011   BPH (benign prostatic hyperplasia) 09/06/2011   Pure hypercholesterolemia 09/06/2011   Hypogonadism male 09/06/2011    Scot Jun 03/24/2021, 9:31 AM  [place].  [place], [place], [address] Phone: [phone]   Fax:  [phone]  Name: Piercen L Rice MRN: [id number] Date of Birth: 26-Oct-1950

## 2021-03-28 ENCOUNTER — Other Ambulatory Visit: Payer: Self-pay

## 2021-03-28 ENCOUNTER — Ambulatory Visit: Payer: BC Managed Care – PPO | Admitting: Physical Therapy

## 2021-03-28 ENCOUNTER — Encounter: Payer: Self-pay | Admitting: Physical Therapy

## 2021-03-28 DIAGNOSIS — M5442 Lumbago with sciatica, left side: Secondary | ICD-10-CM | POA: Diagnosis not present

## 2021-03-28 DIAGNOSIS — M6281 Muscle weakness (generalized): Secondary | ICD-10-CM

## 2021-03-28 DIAGNOSIS — R293 Abnormal posture: Secondary | ICD-10-CM

## 2021-03-28 DIAGNOSIS — G8929 Other chronic pain: Secondary | ICD-10-CM

## 2021-03-28 DIAGNOSIS — R262 Difficulty in walking, not elsewhere classified: Secondary | ICD-10-CM

## 2021-03-28 NOTE — Therapy (Signed)
[place]. [place], [place], [address] Phone: [phone]   Fax:  [phone]  Physical Therapy Treatment  Patient Details  Name: Cameron Rice MRN: [id number] Date of Birth: 1951/08/01 Referring Provider (PT): Nche, Charlene Brooke, NP   Encounter Date: 03/28/2021   PT End of Session - 03/28/21 1553     Visit Number 3    Date for PT Re-Evaluation 04/29/21    Authorization Type BCBS    PT Start Time 1515    PT Stop Time 1555    PT Time Calculation (min) 40 min    Activity Tolerance Patient tolerated treatment well    Behavior During Therapy [facility] for tasks assessed/performed             Past Medical History:  Diagnosis Date   Hyperlipidemia    Hypertension    UTI (urinary tract infection)     Past Surgical History:  Procedure Laterality Date   URETHRA SURGERY     went in clean make sure urethra is clear    There were no vitals filed for this visit.   Subjective Assessment - 03/28/21 1512     Subjective "Just got up, both legs are a little tight"    Currently in Pain? No/denies                               [facility] Adult PT Treatment/Exercise - 03/28/21 0001       Lumbar Exercises: Stretches   Active Hamstring Stretch Right;Left;3 reps;20 seconds;10 seconds    Passive Hamstring Stretch Left;Right;3 reps;20 seconds    Single Knee to Chest Stretch Left;Right;3 reps;10 seconds    Double Knee to Chest Stretch 3 reps;20 seconds;10 seconds    Lower Trunk Rotation 3 reps;10 seconds    Piriformis Stretch Right;Left;3 reps;20 seconds    Figure 4 Stretch 3 reps;20 seconds;With overpressure    Other Lumbar Stretch Exercise Thomas stretch 3x15'' each      Lumbar Exercises: Aerobic   Nustep L5 x 6 min      Lumbar Exercises: Machines for Strengthening   Leg Press 40lb 2x15                      PT Short Term Goals - 03/28/21 1557       PT SHORT TERM GOAL #1    Title Patient to be independent with initial HEP.    Status Achieved               PT Long Term Goals - 03/28/21 1557       PT LONG TERM GOAL #1   Title Patient to be independent with advanced HEP.      PT LONG TERM GOAL #2   Title Patient to demonstrate lumbar ROM WFL and without pain limiting.    Status On-going      PT LONG TERM GOAL #3   Title Patient to demonstrate B LE strength >/=4+/5.    Status On-going      PT LONG TERM GOAL #4   Title Patient to demonstrate mild-moderate B piriformis tightness with stretching remaining.    Status On-going                   Plan - 03/28/21 1554     Clinical Impression Statement A lot of time devoted to passive and active stretching of LE. Good strength on  leg press. Pt has significant LE tightness in hamstring, piriformis, hip flexor and with knee to chest stretch. Pt grunts in pain at times when positioning himself on mat table reporting pain in the L hamstring.    Personal Factors and Comorbidities Age;Fitness;Past/Current Experience;Comorbidity 2;Profession;Time since onset of injury/illness/exacerbation    Comorbidities HLD, HTN    Examination-Activity Limitations Bed Mobility;Reach Overhead;Transfers;Bend;Sit;Carry;Sleep;Squat;Stand;Stairs;Lift    Examination-Participation Restrictions [name];Shop;Driving;Community Activity;Cleaning;Occupation;Church;Meal Prep    Stability/Clinical Decision Making Evolving/Moderate complexity    Rehab Potential Good    PT Duration 6 weeks    PT Treatment/Interventions ADLs/Self Care Home Management;Cryotherapy;Electrical Stimulation;Ultrasound;Traction;Moist Heat;Gait training;Stair training;Functional mobility training;Therapeutic activities;Therapeutic exercise;Balance training;Neuromuscular re-education;Manual techniques;Patient/family education;Passive range of motion;Dry needling;Energy conservation;Taping    PT Next Visit Plan progress hip strengthening and stretching              Patient will benefit from skilled therapeutic intervention in order to improve the following deficits and impairments:  Abnormal gait, Decreased range of motion, Difficulty walking, Increased fascial restricitons, Increased muscle spasms, Decreased safety awareness, Decreased activity tolerance, Pain, Improper body mechanics, Impaired flexibility, Hypomobility, Decreased strength, Postural dysfunction  Visit Diagnosis: Abnormal posture  Chronic bilateral low back pain with bilateral sciatica  Muscle weakness (generalized)  Difficulty in walking, not elsewhere classified     Problem List Patient Active Problem List   Diagnosis Date Noted   Chronic right-sided low back pain 12/22/2020   Hx of colonic polyp 12/10/2018   AC joint arthropathy 11/26/2018   Need for prophylactic vaccination against Streptococcus pneumoniae (pneumococcus) and influenza [id number]   Complicated UTI (urinary tract infection) 08/29/2017   Erectile disorder due to medical condition in male 08/29/2017   Shift work sleep disorder 09/04/2013   Essential hypertension 09/06/2011   BPH (benign prostatic hyperplasia) 09/06/2011   Pure hypercholesterolemia 09/06/2011   Hypogonadism male 09/06/2011    Scot Jun 03/28/2021, 3:58 PM  [place]. [place], [place], [address] Phone: [phone]   Fax:  [phone]  Name: Cameron Rice MRN: [id number] Date of Birth: Nov 03, 1950

## 2021-03-30 ENCOUNTER — Other Ambulatory Visit: Payer: Self-pay

## 2021-03-30 ENCOUNTER — Ambulatory Visit: Payer: BC Managed Care – PPO | Admitting: Physical Therapy

## 2021-03-30 ENCOUNTER — Encounter: Payer: Self-pay | Admitting: Physical Therapy

## 2021-03-30 DIAGNOSIS — R293 Abnormal posture: Secondary | ICD-10-CM

## 2021-03-30 DIAGNOSIS — M6281 Muscle weakness (generalized): Secondary | ICD-10-CM

## 2021-03-30 DIAGNOSIS — G8929 Other chronic pain: Secondary | ICD-10-CM

## 2021-03-30 DIAGNOSIS — M5442 Lumbago with sciatica, left side: Secondary | ICD-10-CM | POA: Diagnosis not present

## 2021-03-30 DIAGNOSIS — R262 Difficulty in walking, not elsewhere classified: Secondary | ICD-10-CM

## 2021-03-30 NOTE — Patient Instructions (Signed)
Access Code: [email or web address] URL: [email or web address] Date: 03/30/2021 Prepared by: Grayling Congress  Exercises Child's Pose Stretch - 2 x daily - 7 x weekly - 5 reps - 10 sec hold Supine Double Knee to Chest - 2 x daily - 7 x weekly - 2 sets - 20 sec hold Seated Scapular Retraction - 2 x daily - 7 x weekly - 2 sets - 10 reps - 3 sec hold

## 2021-03-30 NOTE — Therapy (Signed)
[place]. [place], [place], [address] Phone: [phone]   Fax:  [phone]  Physical Therapy Treatment  Patient Details  Name: Cameron Rice MRN: [id number] Date of Birth: 01/21/1951 Referring Provider (PT): Nche, Charlene Brooke, NP   Encounter Date: 03/30/2021   PT End of Session - 03/30/21 1013     Visit Number 4    Number of Visits 13    Date for PT Re-Evaluation 04/29/21    Authorization Type BCBS    PT Start Time 0927    PT Stop Time 1012    PT Time Calculation (min) 45 min    Activity Tolerance Patient tolerated treatment well    Behavior During Therapy [facility] for tasks assessed/performed             Past Medical History:  Diagnosis Date   Hyperlipidemia    Hypertension    UTI (urinary tract infection)     Past Surgical History:  Procedure Laterality Date   URETHRA SURGERY     went in clean make sure urethra is clear    There were no vitals filed for this visit.   Subjective Assessment - 03/30/21 0928     Subjective "Seems like I'm hurting a little more." Feels like the HS's are aggravated a little. Having some pain accross the back right now.    Pertinent History HLD, HTN    Diagnostic tests none recent    Patient Stated Goals decrease pain    Currently in Pain? Yes    Pain Score 5     Pain Location Back    Pain Orientation Right;Left    Pain Descriptors / Indicators Aching    Pain Type Chronic pain                [facility] PT Assessment - 03/30/21 0001       Special Tests   Other special tests positive B SLR; noted L posterior thigh pain with R SLR                           [facility] Adult PT Treatment/Exercise - 03/30/21 0001       Lumbar Exercises: Stretches   Passive Hamstring Stretch Right;Left;1 rep;30 seconds    Passive Hamstring Stretch Limitations supine with strap    Double Knee to Chest Stretch 2 reps;20 seconds    Piriformis Stretch  Right;Left;2 reps;30 seconds    Piriformis Stretch Limitations supine KTOs and fig4 each side    Other Lumbar Stretch Exercise R/L sciatic nerve glide 10x      Lumbar Exercises: Aerobic   Recumbent Bike L2.5 x 6 min      Lumbar Exercises: Machines for Strengthening   Other Lumbar Machine Exercise Rows 25lb 2x10   heavy manual and verbal cueing to depress R shoulder and retract scap     Lumbar Exercises: Seated   Other Seated Lumbar Exercises scap retractions 10x3"      Lumbar Exercises: Supine   Bridge with clamshell 10 reps   2x10 red TB; limited ROM     Lumbar Exercises: Quadruped   Other Quadruped Lumbar Exercises child's pose 5x10" with triangle bolster under bottom                    PT Education - 03/30/21 1012     Education Details discussion on patient's current symptoms, aggravating/easing factors; update to HEP; advised patient to speak  to PCP if current meds aren't beneficial    Person(s) Educated Patient    Methods Explanation;Demonstration;Tactile cues;Verbal cues;Handout    Comprehension Verbalized understanding;Returned demonstration              PT Short Term Goals - 03/28/21 1557       PT SHORT TERM GOAL #1   Title Patient to be independent with initial HEP.    Status Achieved               PT Long Term Goals - 03/28/21 1557       PT LONG TERM GOAL #1   Title Patient to be independent with advanced HEP.      PT LONG TERM GOAL #2   Title Patient to demonstrate lumbar ROM WFL and without pain limiting.    Status On-going      PT LONG TERM GOAL #3   Title Patient to demonstrate B LE strength >/=4+/5.    Status On-going      PT LONG TERM GOAL #4   Title Patient to demonstrate mild-moderate B piriformis tightness with stretching remaining.    Status On-going                   Plan - 03/30/21 1013     Clinical Impression Statement Patient arrived to session with report of aggravation of HS pain and LBP today without  known cause. Demonstrated flexed forward posture when ambulating into clinic today. Patient demonstrated a positive SLR on R>L side today. Trialed sciatic glides with good tolerance. Patient noting most pain in standing and prone positioning and relief with bending forward. Thus, trialed gentle flexion stretching to evaluate for flexion preference. Worked on periscapular strengthening with patient initially struggling with motor control to retract scapulae and with tendency to elevate R shoulder. Worked on isolating this with unweighted scapular retractions with more success. Updated HEP with exercises that were well-tolerated today for further practice. No complaints at end of session.    Personal Factors and Comorbidities Age;Fitness;Past/Current Experience;Comorbidity 2;Profession;Time since onset of injury/illness/exacerbation    Comorbidities HLD, HTN    Examination-Activity Limitations Bed Mobility;Reach Overhead;Transfers;Bend;Sit;Carry;Sleep;Squat;Stand;Stairs;Lift    Examination-Participation Restrictions [name];Shop;Driving;Community Activity;Cleaning;Occupation;Church;Meal Prep    Stability/Clinical Decision Making Evolving/Moderate complexity    Rehab Potential Good    PT Duration 6 weeks    PT Treatment/Interventions ADLs/Self Care Home Management;Cryotherapy;Electrical Stimulation;Ultrasound;Traction;Moist Heat;Gait training;Stair training;Functional mobility training;Therapeutic activities;Therapeutic exercise;Balance training;Neuromuscular re-education;Manual techniques;Patient/family education;Passive range of motion;Dry needling;Energy conservation;Taping    PT Next Visit Plan progress hip strengthening and stretching; assess response to flexion-based ther-ex    Consulted and Agree with Plan of Care Patient             Patient will benefit from skilled therapeutic intervention in order to improve the following deficits and impairments:  Abnormal gait, Decreased range of  motion, Difficulty walking, Increased fascial restricitons, Increased muscle spasms, Decreased safety awareness, Decreased activity tolerance, Pain, Improper body mechanics, Impaired flexibility, Hypomobility, Decreased strength, Postural dysfunction  Visit Diagnosis: Abnormal posture  Chronic bilateral low back pain with bilateral sciatica  Muscle weakness (generalized)  Difficulty in walking, not elsewhere classified     Problem List Patient Active Problem List   Diagnosis Date Noted   Chronic right-sided low back pain 12/22/2020   Hx of colonic polyp 12/10/2018   AC joint arthropathy 11/26/2018   Need for prophylactic vaccination against Streptococcus pneumoniae (pneumococcus) and influenza [id number]   Complicated UTI (urinary tract infection) 08/29/2017   Erectile disorder due  to medical condition in male 08/29/2017   Shift work sleep disorder 09/04/2013   Essential hypertension 09/06/2011   BPH (benign prostatic hyperplasia) 09/06/2011   Pure hypercholesterolemia 09/06/2011   Hypogonadism male 09/06/2011     Janene Harvey, PT, DPT 03/30/21 10:15 AM   [place]. [place], [place], [address] Phone: [phone]   Fax:  [phone]  Name: Cabe L Rice MRN: [id number] Date of Birth: 1950/09/15

## 2021-04-07 ENCOUNTER — Other Ambulatory Visit: Payer: Self-pay

## 2021-04-07 ENCOUNTER — Ambulatory Visit: Payer: BC Managed Care – PPO | Attending: Nurse Practitioner | Admitting: Physical Therapy

## 2021-04-07 ENCOUNTER — Encounter: Payer: Self-pay | Admitting: Physical Therapy

## 2021-04-07 DIAGNOSIS — M5442 Lumbago with sciatica, left side: Secondary | ICD-10-CM | POA: Diagnosis present

## 2021-04-07 DIAGNOSIS — R293 Abnormal posture: Secondary | ICD-10-CM | POA: Diagnosis not present

## 2021-04-07 DIAGNOSIS — G8929 Other chronic pain: Secondary | ICD-10-CM | POA: Insufficient documentation

## 2021-04-07 DIAGNOSIS — M5441 Lumbago with sciatica, right side: Secondary | ICD-10-CM | POA: Insufficient documentation

## 2021-04-07 DIAGNOSIS — M6281 Muscle weakness (generalized): Secondary | ICD-10-CM | POA: Insufficient documentation

## 2021-04-07 DIAGNOSIS — R262 Difficulty in walking, not elsewhere classified: Secondary | ICD-10-CM | POA: Diagnosis present

## 2021-04-07 NOTE — Therapy (Signed)
[place]. [place], [place], [address] Phone: [phone]   Fax:  [phone]  Physical Therapy Treatment  Patient Details  Name: Cameron Rice MRN: [id number] Date of Birth: 1951-04-01 Referring Provider (PT): Nche, Charlene Brooke, NP   Encounter Date: 04/07/2021   PT End of Session - 04/07/21 1559     Visit Number 5    Number of Visits 13    Date for PT Re-Evaluation 04/29/21    Authorization Type BCBS    PT Start Time 1515    PT Stop Time 1600    PT Time Calculation (min) 45 min             Past Medical History:  Diagnosis Date   Hyperlipidemia    Hypertension    UTI (urinary tract infection)     Past Surgical History:  Procedure Laterality Date   URETHRA SURGERY     went in clean make sure urethra is clear    There were no vitals filed for this visit.   Subjective Assessment - 04/07/21 1517     Subjective "I think better" reports when he feels pain in his legs, tightening stomach muscles help    Currently in Pain? Yes    Pain Score 4     Pain Location --   hamstring   Pain Orientation Left;Right                               [facility] Adult PT Treatment/Exercise - 04/07/21 0001       Lumbar Exercises: Aerobic   Recumbent Bike L2.5 x 6 min      Lumbar Exercises: Machines for Strengthening   Other Lumbar Machine Exercise Rows 25lb 2x10    Other Lumbar Machine Exercise Lats 25lb 2x10      Lumbar Exercises: Standing   Shoulder Extension Strengthening;Power Tower;20 reps    Shoulder Extension Limitations 10      Lumbar Exercises: Supine   Dead Bug 5 reps;2 seconds   x3   Bridge with clamshell Compliant;10 reps;2 seconds    Straight Leg Raise 15 reps;2 seconds      Lumbar Exercises: Prone   Opposite Arm/Leg Raise Left arm/Right leg;Right arm/Left leg;10 reps;2 seconds      Lumbar Exercises: Quadruped   Opposite Arm/Leg Raise Left arm/Right leg;Right arm/Left  leg;10 reps;2 seconds    Other Quadruped Lumbar Exercises bird dog rows                      PT Short Term Goals - 03/28/21 1557       PT SHORT TERM GOAL #1   Title Patient to be independent with initial HEP.    Status Achieved               PT Long Term Goals - 03/28/21 1557       PT LONG TERM GOAL #1   Title Patient to be independent with advanced HEP.      PT LONG TERM GOAL #2   Title Patient to demonstrate lumbar ROM WFL and without pain limiting.    Status On-going      PT LONG TERM GOAL #3   Title Patient to demonstrate B LE strength >/=4+/5.    Status On-going      PT LONG TERM GOAL #4   Title Patient to demonstrate mild-moderate B piriformis tightness with stretching remaining.  Status On-going                   Plan - 04/07/21 1606     Clinical Impression Statement Entering clinic pt reports that his [place] seems to subside when he tightness his core muscles. today's session focus on both posterior chain abdominal strengthening. Weakness present with both prone opposite arm/leg and with dead bugs.Attempted a full superman but unable to complete due to weakness and HS pain. Visible shaking and instability noted with bird dogs.    Personal Factors and Comorbidities Age;Fitness;Past/Current Experience;Comorbidity 2;Profession;Time since onset of injury/illness/exacerbation    Examination-Activity Limitations Bed Mobility;Reach Overhead;Transfers;Bend;Sit;Carry;Sleep;Squat;Stand;Stairs;Lift    Examination-Participation Restrictions [name];Shop;Driving;Community Activity;Cleaning;Occupation;Church;Meal Prep    Stability/Clinical Decision Making Evolving/Moderate complexity    Rehab Potential Good    PT Duration 6 weeks    PT Treatment/Interventions ADLs/Self Care Home Management;Cryotherapy;Electrical Stimulation;Ultrasound;Traction;Moist Heat;Gait training;Stair training;Functional mobility training;Therapeutic  activities;Therapeutic exercise;Balance training;Neuromuscular re-education;Manual techniques;Patient/family education;Passive range of motion;Dry needling;Energy conservation;Taping    PT Next Visit Plan progress hip & core strengthening, assess response to flexion-based ther-ex             Patient will benefit from skilled therapeutic intervention in order to improve the following deficits and impairments:  Abnormal gait, Decreased range of motion, Difficulty walking, Increased fascial restricitons, Increased muscle spasms, Decreased safety awareness, Decreased activity tolerance, Pain, Improper body mechanics, Impaired flexibility, Hypomobility, Decreased strength, Postural dysfunction  Visit Diagnosis: Abnormal posture  Chronic bilateral low back pain with bilateral sciatica  Difficulty in walking, not elsewhere classified     Problem List Patient Active Problem List   Diagnosis Date Noted   Chronic right-sided low back pain 12/22/2020   Hx of colonic polyp 12/10/2018   AC joint arthropathy 11/26/2018   Need for prophylactic vaccination against Streptococcus pneumoniae (pneumococcus) and influenza [id number]   Complicated UTI (urinary tract infection) 08/29/2017   Erectile disorder due to medical condition in male 08/29/2017   Shift work sleep disorder 09/04/2013   Essential hypertension 09/06/2011   BPH (benign prostatic hyperplasia) 09/06/2011   Pure hypercholesterolemia 09/06/2011   Hypogonadism male 09/06/2011    Scot Jun 04/07/2021, 4:18 PM  [place]. [place], [place], [address] Phone: [phone]   Fax:  [phone]  Name: Cameron Rice MRN: [id number] Date of Birth: May 04, 1951

## 2021-04-13 ENCOUNTER — Ambulatory Visit: Payer: BC Managed Care – PPO | Admitting: Physical Therapy

## 2021-04-13 ENCOUNTER — Encounter: Payer: Self-pay | Admitting: Physical Therapy

## 2021-04-13 ENCOUNTER — Other Ambulatory Visit: Payer: Self-pay

## 2021-04-13 DIAGNOSIS — M6281 Muscle weakness (generalized): Secondary | ICD-10-CM

## 2021-04-13 DIAGNOSIS — R293 Abnormal posture: Secondary | ICD-10-CM | POA: Diagnosis not present

## 2021-04-13 DIAGNOSIS — R262 Difficulty in walking, not elsewhere classified: Secondary | ICD-10-CM

## 2021-04-13 DIAGNOSIS — G8929 Other chronic pain: Secondary | ICD-10-CM

## 2021-04-13 DIAGNOSIS — M5442 Lumbago with sciatica, left side: Secondary | ICD-10-CM

## 2021-04-13 NOTE — Therapy (Signed)
[address] [place], [place], [address] Phone: [phone]   Fax:  [phone]  Physical Therapy Treatment  Patient Details  Name: Cameron Rice MRN: [id number] Date of Birth: 1950-09-07 Referring Provider (PT): Nche, Charlene Brooke, NP   Encounter Date: 04/13/2021   PT End of Session - 04/13/21 1519     Visit Number 6    Date for PT Re-Evaluation 04/29/21    Authorization Type BCBS    PT Start Time 1430    PT Stop Time 1515    PT Time Calculation (min) 45 min    Activity Tolerance Patient tolerated treatment well    Behavior During Therapy [facility] for tasks assessed/performed             Past Medical History:  Diagnosis Date   Hyperlipidemia    Hypertension    UTI (urinary tract infection)     Past Surgical History:  Procedure Laterality Date   URETHRA SURGERY     went in clean make sure urethra is clear    There were no vitals filed for this visit.   Subjective Assessment - 04/13/21 1432     Subjective Doing ok, leg pain is off and on    Currently in Pain? Yes    Pain Score 2     Pain Location --   hamstring area   Pain Orientation Right;Left                               [facility] Adult PT Treatment/Exercise - 04/13/21 0001       Lumbar Exercises: Stretches   Active Hamstring Stretch Right;Left;3 reps;10 seconds      Lumbar Exercises: Aerobic   Recumbent Bike L2.7 x 7 min      Lumbar Exercises: Machines for Strengthening   Other Lumbar Machine Exercise Rows 35lb 2x12    Other Lumbar Machine Exercise Lats 35lb 2x10      Lumbar Exercises: Standing   Heel Raises 20 reps;2 seconds    Other Standing Lumbar Exercises Overhead Ext yelow ball 2x10      Lumbar Exercises: Prone   Opposite Arm/Leg Raise Left arm/Right leg;Right arm/Left leg;10 reps;2 seconds      Lumbar Exercises: Quadruped   Opposite Arm/Leg Raise 5 reps;2 seconds   x2                 Upper  Extremity Functional Index Score :   /80     PT Short Term Goals - 03/28/21 1557       PT SHORT TERM GOAL #1   Title Patient to be independent with initial HEP.    Status Achieved               PT Long Term Goals - 03/28/21 1557       PT LONG TERM GOAL #1   Title Patient to be independent with advanced HEP.      PT LONG TERM GOAL #2   Title Patient to demonstrate lumbar ROM WFL and without pain limiting.    Status On-going      PT LONG TERM GOAL #3   Title Patient to demonstrate B LE strength >/=4+/5.    Status On-going      PT LONG TERM GOAL #4   Title Patient to demonstrate mild-moderate B piriformis tightness with stretching remaining.    Status On-going  Plan - 04/13/21 1519     Clinical Impression Statement Continues with core strength and HS stretching today. Pt has a forward flexed posture at rest so interventions chosen that aided in correcting posture. Bird dog and prone opposite were difficult and taxing on pt demo ing decrease postural strength. Some pain reported with active HS stretching.    Personal Factors and Comorbidities Age;Fitness;Past/Current Experience;Comorbidity 2;Profession;Time since onset of injury/illness/exacerbation    Examination-Activity Limitations Bed Mobility;Reach Overhead;Transfers;Bend;Sit;Carry;Sleep;Squat;Stand;Stairs;Lift    Examination-Participation Restrictions [name];Shop;Driving;Community Activity;Cleaning;Occupation;Church;Meal Prep    Stability/Clinical Decision Making Evolving/Moderate complexity    Rehab Potential Good    PT Duration 6 weeks    PT Treatment/Interventions ADLs/Self Care Home Management;Cryotherapy;Electrical Stimulation;Ultrasound;Traction;Moist Heat;Gait training;Stair training;Functional mobility training;Therapeutic activities;Therapeutic exercise;Balance training;Neuromuscular re-education;Manual techniques;Patient/family education;Passive range of motion;Dry  needling;Energy conservation;Taping    PT Next Visit Plan progress hip & core strengthening, assess response to flexion-based ther-ex             Patient will benefit from skilled therapeutic intervention in order to improve the following deficits and impairments:  Abnormal gait, Decreased range of motion, Difficulty walking, Increased fascial restricitons, Increased muscle spasms, Decreased safety awareness, Decreased activity tolerance, Pain, Improper body mechanics, Impaired flexibility, Hypomobility, Decreased strength, Postural dysfunction  Visit Diagnosis: Difficulty in walking, not elsewhere classified  Muscle weakness (generalized)  Chronic bilateral low back pain with bilateral sciatica  Abnormal posture     Problem List Patient Active Problem List   Diagnosis Date Noted   Chronic right-sided low back pain 12/22/2020   Hx of colonic polyp 12/10/2018   AC joint arthropathy 11/26/2018   Need for prophylactic vaccination against Streptococcus pneumoniae (pneumococcus) and influenza [id number]   Complicated UTI (urinary tract infection) 08/29/2017   Erectile disorder due to medical condition in male 08/29/2017   Shift work sleep disorder 09/04/2013   Essential hypertension 09/06/2011   BPH (benign prostatic hyperplasia) 09/06/2011   Pure hypercholesterolemia 09/06/2011   Hypogonadism male 09/06/2011    Scot Jun, PTA 04/13/2021, 3:25 PM  [address]. [place], [place], [address] Phone: [phone]   Fax:  [phone]  Name: Cameron Rice MRN: [id number] Date of Birth: 15-Feb-1951

## 2021-04-15 ENCOUNTER — Ambulatory Visit: Payer: BC Managed Care – PPO | Admitting: Physical Therapy

## 2021-04-15 ENCOUNTER — Other Ambulatory Visit: Payer: Self-pay

## 2021-04-15 DIAGNOSIS — G8929 Other chronic pain: Secondary | ICD-10-CM

## 2021-04-15 DIAGNOSIS — M6281 Muscle weakness (generalized): Secondary | ICD-10-CM

## 2021-04-15 DIAGNOSIS — R293 Abnormal posture: Secondary | ICD-10-CM

## 2021-04-15 DIAGNOSIS — R262 Difficulty in walking, not elsewhere classified: Secondary | ICD-10-CM

## 2021-04-15 DIAGNOSIS — M5442 Lumbago with sciatica, left side: Secondary | ICD-10-CM

## 2021-04-15 NOTE — Therapy (Signed)
[address]. [place], [place], [address] Phone: [phone]   Fax:  [phone]  Physical Therapy Treatment  Patient Details  Name: Cameron Rice MRN: [id number] Date of Birth: 1950-10-13 Referring Provider (PT): Nche, Charlene Brooke, NP   Encounter Date: 04/15/2021   PT End of Session - 04/15/21 0840     Visit Number 7    Number of Visits 13    Date for PT Re-Evaluation 04/29/21    Authorization Type BCBS    PT Start Time 0758    PT Stop Time 0841    PT Time Calculation (min) 43 min    Activity Tolerance Patient tolerated treatment well    Behavior During Therapy [facility] for tasks assessed/performed             Past Medical History:  Diagnosis Date   Hyperlipidemia    Hypertension    UTI (urinary tract infection)     Past Surgical History:  Procedure Laterality Date   URETHRA SURGERY     went in clean make sure urethra is clear    There were no vitals filed for this visit.   Subjective Assessment - 04/15/21 0759     Subjective "I feel ok"    Currently in Pain? No/denies                               [facility] Adult PT Treatment/Exercise - 04/15/21 0001       Lumbar Exercises: Stretches   Active Hamstring Stretch Right;Left;3 reps;10 seconds    Other Lumbar Stretch Exercise Gastroc stretch 4 x 100''      Lumbar Exercises: Aerobic   Elliptical L1.7 x 6 min      Lumbar Exercises: Standing   Other Standing Lumbar Exercises facing wall overhead Ext yellow ball 2x10    Other Standing Lumbar Exercises AR press 20lb x10      Lumbar Exercises: Prone   Opposite Arm/Leg Raise Right arm/Left leg;Left arm/Right leg;2 seconds;20 reps    Other Prone Lumbar Exercises prone on elbow 3 x10''    Other Prone Lumbar Exercises super mans      Lumbar Exercises: Quadruped   Opposite Arm/Leg Raise Right arm/Left leg;Left arm/Right leg;20 reps;2 seconds                        PT Short Term Goals - 03/28/21 1557       PT SHORT TERM GOAL #1   Title Patient to be independent with initial HEP.    Status Achieved               PT Long Term Goals - 03/28/21 1557       PT LONG TERM GOAL #1   Title Patient to be independent with advanced HEP.      PT LONG TERM GOAL #2   Title Patient to demonstrate lumbar ROM WFL and without pain limiting.    Status On-going      PT LONG TERM GOAL #3   Title Patient to demonstrate B LE strength >/=4+/5.    Status On-going      PT LONG TERM GOAL #4   Title Patient to demonstrate mild-moderate B piriformis tightness with stretching remaining.    Status On-going                   Plan - 04/15/21 0840  Clinical Impression Statement Pt enters clinic reporting no pain after working 12 hour shift. Forward flex posture remains at rest. Core weakness present with anti - rotational press. Prone opposite and bird dogs continues to expose weakness. Constant cue to relax trunk needed with prone press ups. No report of increase pain.    Personal Factors and Comorbidities Age;Fitness;Past/Current Experience;Comorbidity 2;Profession;Time since onset of injury/illness/exacerbation    Comorbidities HLD, HTN    Examination-Activity Limitations Bed Mobility;Reach Overhead;Transfers;Bend;Sit;Carry;Sleep;Squat;Stand;Stairs;Lift    Examination-Participation Restrictions [name];Shop;Driving;Community Activity;Cleaning;Occupation;Church;Meal Prep    Stability/Clinical Decision Making Evolving/Moderate complexity    Rehab Potential Good    PT Duration 6 weeks    PT Treatment/Interventions ADLs/Self Care Home Management;Cryotherapy;Electrical Stimulation;Ultrasound;Traction;Moist Heat;Gait training;Stair training;Functional mobility training;Therapeutic activities;Therapeutic exercise;Balance training;Neuromuscular re-education;Manual techniques;Patient/family education;Passive range of motion;Dry needling;Energy  conservation;Taping    PT Next Visit Plan progress hip & core strengthening, assess response to flexion-based ther-ex             Patient will benefit from skilled therapeutic intervention in order to improve the following deficits and impairments:  Abnormal gait, Decreased range of motion, Difficulty walking, Increased fascial restricitons, Increased muscle spasms, Decreased safety awareness, Decreased activity tolerance, Pain, Improper body mechanics, Impaired flexibility, Hypomobility, Decreased strength, Postural dysfunction  Visit Diagnosis: Difficulty in walking, not elsewhere classified  Muscle weakness (generalized)  Chronic bilateral low back pain with bilateral sciatica  Abnormal posture     Problem List Patient Active Problem List   Diagnosis Date Noted   Chronic right-sided low back pain 12/22/2020   Hx of colonic polyp 12/10/2018   AC joint arthropathy 11/26/2018   Need for prophylactic vaccination against Streptococcus pneumoniae (pneumococcus) and influenza [id number]   Complicated UTI (urinary tract infection) 08/29/2017   Erectile disorder due to medical condition in male 08/29/2017   Shift work sleep disorder 09/04/2013   Essential hypertension 09/06/2011   BPH (benign prostatic hyperplasia) 09/06/2011   Pure hypercholesterolemia 09/06/2011   Hypogonadism male 09/06/2011    Scot Jun, PTA 04/15/2021, 8:43 AM  [address]. [place], [place], [address] Phone: [phone]   Fax:  [phone]  Name: Cameron Rice MRN: [id number] Date of Birth: [birth year]

## 2021-04-21 ENCOUNTER — Encounter: Payer: Self-pay | Admitting: Physical Therapy

## 2021-04-21 ENCOUNTER — Ambulatory Visit: Payer: BC Managed Care – PPO | Admitting: Physical Therapy

## 2021-04-21 ENCOUNTER — Other Ambulatory Visit: Payer: Self-pay

## 2021-04-21 DIAGNOSIS — R293 Abnormal posture: Secondary | ICD-10-CM

## 2021-04-21 DIAGNOSIS — M6281 Muscle weakness (generalized): Secondary | ICD-10-CM

## 2021-04-21 DIAGNOSIS — M5442 Lumbago with sciatica, left side: Secondary | ICD-10-CM

## 2021-04-21 DIAGNOSIS — R262 Difficulty in walking, not elsewhere classified: Secondary | ICD-10-CM

## 2021-04-21 DIAGNOSIS — G8929 Other chronic pain: Secondary | ICD-10-CM

## 2021-04-21 NOTE — Therapy (Signed)
[place] [address]. [place], [place], [address] Phone: [phone]   Fax:  [phone]  Physical Therapy Treatment  Patient Details  Name: Cameron Rice MRN: [id number] Date of Birth: 06-24-1951 Referring Provider (PT): Nche, Charlene Brooke, NP   Encounter Date: 04/21/2021   PT End of Session - 04/21/21 0936     Visit Number 8    Number of Visits 13    Date for PT Re-Evaluation 04/29/21    Authorization Type BCBS    PT Start Time [phone]    PT Stop Time 0930    PT Time Calculation (min) 41 min    Activity Tolerance Patient tolerated treatment well    Behavior During Therapy [facility] for tasks assessed/performed             Past Medical History:  Diagnosis Date   Hyperlipidemia    Hypertension    UTI (urinary tract infection)     Past Surgical History:  Procedure Laterality Date   URETHRA SURGERY     went in clean make sure urethra is clear    There were no vitals filed for this visit.   Subjective Assessment - 04/21/21 0850     Subjective Has good days and bad days. Having some pain right now since he just got up.    Pertinent History HLD, HTN    Diagnostic tests none recent    Patient Stated Goals decrease pain    Currently in Pain? Yes    Pain Score 7     Pain Location Back    Pain Orientation Right;Left;Lower    Pain Descriptors / Indicators Aching    Pain Type Chronic pain                               [facility] Adult PT Treatment/Exercise - 04/21/21 0001       Self-Care   Self-Care --   edu and demo using ball on wall STM to glutes     Lumbar Exercises: Stretches   Lower Trunk Rotation Limitations 10x   cues to maintain LB on mat     Lumbar Exercises: Aerobic   Recumbent Bike L2.7 x 6 min      Lumbar Exercises: Supine   Other Supine Lumbar Exercises R/L opposite UE/LE hip flexion isometric 5x5" at 50% effort      Lumbar Exercises: Sidelying   Other Sidelying Lumbar  Exercises R/L open book 10x with LE elevated on foam roll      Manual Therapy   Manual Therapy Soft tissue mobilization;Myofascial release    Manual therapy comments L sidelying    Soft tissue mobilization STM to R QL, lumbar paraspinals, proximal glutes and piri    Myofascial Release manual TPR to R QL, proximal glutes, piri   considerable soft tissue restriciton throughout with reproduction of pain over lateral glutes                    PT Education - 04/21/21 0934     Education Details edu on possibility of referred pain stemming from glute trigger points    Person(s) Educated Patient    Methods Explanation    Comprehension Verbalized understanding              PT Short Term Goals - 03/28/21 1557       PT SHORT TERM GOAL #1   Title Patient to be independent  with initial HEP.    Status Achieved               PT Long Term Goals - 03/28/21 1557       PT LONG TERM GOAL #1   Title Patient to be independent with advanced HEP.      PT LONG TERM GOAL #2   Title Patient to demonstrate lumbar ROM WFL and without pain limiting.    Status On-going      PT LONG TERM GOAL #3   Title Patient to demonstrate B LE strength >/=4+/5.    Status On-going      PT LONG TERM GOAL #4   Title Patient to demonstrate mild-moderate B piriformis tightness with stretching remaining.    Status On-going                   Plan - 04/21/21 0937     Clinical Impression Statement Patient reporting elevated LBP at start of session d/t just waking up. Reports tightness and stiffness, particularly in AM. Worked on lumbopelvic mobility to address patient's complaints. Without allowance for compensation, patient demonstrated considerable limitation in lumbar R/L rotation mobility.  Patient reported some discomfort in B posterior thighs with LEs elevated in 90/90 position during ther-ex. Proceeded with STM and manual TPR on R, which revealed considerable tightness and soft tissue  restriction throughout the R posterior chain, with reproduction of pain down the posterior thigh with pressure over lateral glutes. Educated patient on possibility of referred pain from these trigger points and educated on self-STM. Patient reported improved pain on R LE at end of session.    Personal Factors and Comorbidities Age;Fitness;Past/Current Experience;Comorbidity 2;Profession;Time since onset of injury/illness/exacerbation    Comorbidities HLD, HTN    Examination-Activity Limitations Bed Mobility;Reach Overhead;Transfers;Bend;Sit;Carry;Sleep;Squat;Stand;Stairs;Lift    Examination-Participation Restrictions [name];Shop;Driving;Community Activity;Cleaning;Occupation;Church;Meal Prep    Stability/Clinical Decision Making Evolving/Moderate complexity    Rehab Potential Good    PT Duration 6 weeks    PT Treatment/Interventions ADLs/Self Care Home Management;Cryotherapy;Electrical Stimulation;Ultrasound;Traction;Moist Heat;Gait training;Stair training;Functional mobility training;Therapeutic activities;Therapeutic exercise;Balance training;Neuromuscular re-education;Manual techniques;Patient/family education;Passive range of motion;Dry needling;Energy conservation;Taping    PT Next Visit Plan progress hip & core strengthening, assess response to flexion-based ther-ex    Consulted and Agree with Plan of Care Patient             Patient will benefit from skilled therapeutic intervention in order to improve the following deficits and impairments:  Abnormal gait, Decreased range of motion, Difficulty walking, Increased fascial restricitons, Increased muscle spasms, Decreased safety awareness, Decreased activity tolerance, Pain, Improper body mechanics, Impaired flexibility, Hypomobility, Decreased strength, Postural dysfunction  Visit Diagnosis: Difficulty in walking, not elsewhere classified  Muscle weakness (generalized)  Chronic bilateral low back pain with bilateral  sciatica  Abnormal posture     Problem List Patient Active Problem List   Diagnosis Date Noted   Chronic right-sided low back pain 12/22/2020   Hx of colonic polyp 12/10/2018   AC joint arthropathy 11/26/2018   Need for prophylactic vaccination against Streptococcus pneumoniae (pneumococcus) and influenza [id number]   Complicated UTI (urinary tract infection) 08/29/2017   Erectile disorder due to medical condition in male 08/29/2017   Shift work sleep disorder 09/04/2013   Essential hypertension 09/06/2011   BPH (benign prostatic hyperplasia) 09/06/2011   Pure hypercholesterolemia 09/06/2011   Hypogonadism male 09/06/2011    Janene Harvey, PT, DPT 04/21/21 9:42 AM   [place]. [place], [place], [address]  Phone: [phone]   Fax:  [phone]  Name: Cameron Rice MRN: [id number] Date of Birth: 04/23/1951

## 2021-04-25 ENCOUNTER — Encounter: Payer: Self-pay | Admitting: Physical Therapy

## 2021-04-25 ENCOUNTER — Other Ambulatory Visit: Payer: Self-pay

## 2021-04-25 ENCOUNTER — Ambulatory Visit: Payer: BC Managed Care – PPO | Admitting: Physical Therapy

## 2021-04-25 DIAGNOSIS — M5442 Lumbago with sciatica, left side: Secondary | ICD-10-CM

## 2021-04-25 DIAGNOSIS — R293 Abnormal posture: Secondary | ICD-10-CM

## 2021-04-25 DIAGNOSIS — R262 Difficulty in walking, not elsewhere classified: Secondary | ICD-10-CM

## 2021-04-25 DIAGNOSIS — M6281 Muscle weakness (generalized): Secondary | ICD-10-CM

## 2021-04-25 DIAGNOSIS — G8929 Other chronic pain: Secondary | ICD-10-CM

## 2021-04-25 NOTE — Therapy (Signed)
[place]. [place], [place], [address] Phone: [phone]   Fax:  [phone]  Physical Therapy Treatment  Patient Details  Name: Cameron Rice MRN: [id number] Date of Birth: 02/01/1951 Referring Provider (PT): Nche, Charlene Brooke, NP   Encounter Date: 04/25/2021   PT End of Session - 04/25/21 1148     Visit Number 9    Number of Visits 13    Date for PT Re-Evaluation 04/29/21    Authorization Type BCBS    PT Start Time 1102    PT Stop Time 1143    PT Time Calculation (min) 41 min    Activity Tolerance Patient tolerated treatment well    Behavior During Therapy [facility] for tasks assessed/performed             Past Medical History:  Diagnosis Date   Hyperlipidemia    Hypertension    UTI (urinary tract infection)     Past Surgical History:  Procedure Laterality Date   URETHRA SURGERY     went in clean make sure urethra is clear    There were no vitals filed for this visit.   Subjective Assessment - 04/25/21 1104     Subjective Feels virtually the same. Had some short lasting pain relief with STM and self-massage. Has to do a bit more bending and stooping at work recently.    Pertinent History HLD, HTN    Diagnostic tests none recent    Patient Stated Goals decrease pain    Currently in Pain? Yes    Pain Score 3     Pain Location Back    Pain Orientation Right;Left;Lower    Pain Descriptors / Indicators Aching    Pain Type Chronic pain                               [facility] Adult PT Treatment/Exercise - 04/25/21 0001       Lumbar Exercises: Aerobic   Recumbent Bike L2.7 x 7 min      Lumbar Exercises: Standing   Shoulder Extension Strengthening;Both;Theraband    Theraband Level (Shoulder Extension) Level 2 (Red)    Shoulder Extension Limitations 2x10 cues to contract core before each rep; cueing to contract glutes and shift hips forward      Lumbar Exercises: Supine    Pelvic Tilt 10 reps    Pelvic Tilt Limitations --   difficulty achieving proper movement pattern   Glut Set 10 reps    Glut Set Limitations 10x5"   in supine and hooklying with PT and self-palpation   Bridge 10 reps    Bridge Limitations cues to contract core and glutes rather than paraspinals    Other Supine Lumbar Exercises alt toe tap with core contraction 12x each      Lumbar Exercises: Prone   Other Prone Lumbar Exercises prone hip extension 5x each   PT palpation to avoid compensations with back     Manual Therapy   Manual Therapy Soft tissue mobilization;Myofascial release    Manual therapy comments prone    Soft tissue mobilization STM to L QL, lumbar paraspinals, proximal glutes and piri    Myofascial Release manual TPR to R QL, proximal glutes, piri   reproduction of L HS pain with pressure over L paraspinals and QL                    PT Education -  04/25/21 1148     Education Details update to HEP; edu on DN    Person(s) Educated Patient    Methods Explanation;Demonstration;Tactile cues;Verbal cues;Handout    Comprehension Verbalized understanding;Returned demonstration              PT Short Term Goals - 03/28/21 1557       PT SHORT TERM GOAL #1   Title Patient to be independent with initial HEP.    Status Achieved               PT Long Term Goals - 03/28/21 1557       PT LONG TERM GOAL #1   Title Patient to be independent with advanced HEP.      PT LONG TERM GOAL #2   Title Patient to demonstrate lumbar ROM WFL and without pain limiting.    Status On-going      PT LONG TERM GOAL #3   Title Patient to demonstrate B LE strength >/=4+/5.    Status On-going      PT LONG TERM GOAL #4   Title Patient to demonstrate mild-moderate B piriformis tightness with stretching remaining.    Status On-going                   Plan - 04/25/21 1148     Clinical Impression Statement Patient arrived to session with report of  short-lasting pain relief with STM and self-massage. Tolerated STM and manual TPR with patient noting reproduction of L HS pain with pressure over L paraspinals and QL. These muscle groups may benefit from DN in future sessions. Worked on hip strengthening with focused palpation to isolate glute rather than paraspinals. Patient required self-palpation and consistent cueing to achieve glute set in order to better respond to cues and maintain upright posture during ther-ex and rest. Patient able to demonstrate glute set sufficiently at end of session. No complaints at end of session.    Personal Factors and Comorbidities Age;Fitness;Past/Current Experience;Comorbidity 2;Profession;Time since onset of injury/illness/exacerbation    Comorbidities HLD, HTN    Examination-Activity Limitations Bed Mobility;Reach Overhead;Transfers;Bend;Sit;Carry;Sleep;Squat;Stand;Stairs;Lift    Examination-Participation Restrictions [name];Shop;Driving;Community Activity;Cleaning;Occupation;Church;Meal Prep    Stability/Clinical Decision Making Evolving/Moderate complexity    Rehab Potential Good    PT Duration 6 weeks    PT Treatment/Interventions ADLs/Self Care Home Management;Cryotherapy;Electrical Stimulation;Ultrasound;Traction;Moist Heat;Gait training;Stair training;Functional mobility training;Therapeutic activities;Therapeutic exercise;Balance training;Neuromuscular re-education;Manual techniques;Patient/family education;Passive range of motion;Dry needling;Energy conservation;Taping    PT Next Visit Plan progress hip & core strengthening, assess response to flexion-based ther-ex    Consulted and Agree with Plan of Care Patient             Patient will benefit from skilled therapeutic intervention in order to improve the following deficits and impairments:  Abnormal gait, Decreased range of motion, Difficulty walking, Increased fascial restricitons, Increased muscle spasms, Decreased safety awareness,  Decreased activity tolerance, Pain, Improper body mechanics, Impaired flexibility, Hypomobility, Decreased strength, Postural dysfunction  Visit Diagnosis: Difficulty in walking, not elsewhere classified  Muscle weakness (generalized)  Chronic bilateral low back pain with bilateral sciatica  Abnormal posture     Problem List Patient Active Problem List   Diagnosis Date Noted   Chronic right-sided low back pain 12/22/2020   Hx of colonic polyp 12/10/2018   AC joint arthropathy 11/26/2018   Need for prophylactic vaccination against Streptococcus pneumoniae (pneumococcus) and influenza [id number]   Complicated UTI (urinary tract infection) 08/29/2017   Erectile disorder due to medical condition in male 08/29/2017  Shift work sleep disorder 09/04/2013   Essential hypertension 09/06/2011   BPH (benign prostatic hyperplasia) 09/06/2011   Pure hypercholesterolemia 09/06/2011   Hypogonadism male 09/06/2011     Janene Harvey, PT, DPT 04/25/21 11:55 AM    [place]. [place], [place], [address] Phone: [phone]   Fax:  [phone]  Name: Stran L Rice MRN: [id number] Date of Birth: 1950-10-03

## 2021-04-25 NOTE — Patient Instructions (Addendum)

## 2021-04-27 ENCOUNTER — Other Ambulatory Visit: Payer: Self-pay

## 2021-04-27 ENCOUNTER — Encounter: Payer: Self-pay | Admitting: Physical Therapy

## 2021-04-27 ENCOUNTER — Ambulatory Visit: Payer: BC Managed Care – PPO | Admitting: Physical Therapy

## 2021-04-27 DIAGNOSIS — M6281 Muscle weakness (generalized): Secondary | ICD-10-CM

## 2021-04-27 DIAGNOSIS — M5442 Lumbago with sciatica, left side: Secondary | ICD-10-CM

## 2021-04-27 DIAGNOSIS — R293 Abnormal posture: Secondary | ICD-10-CM | POA: Diagnosis not present

## 2021-04-27 DIAGNOSIS — G8929 Other chronic pain: Secondary | ICD-10-CM

## 2021-04-27 DIAGNOSIS — R262 Difficulty in walking, not elsewhere classified: Secondary | ICD-10-CM

## 2021-04-27 NOTE — Therapy (Addendum)
[address] [place], [place], [address] Phone: [phone]   Fax:  [phone]  Physical Therapy Progress Note  Patient Details  Name: Cameron Rice MRN: [id number] Date of Birth: 20-Apr-1951 Referring Provider (PT): Nche, Charlene Brooke, NP   Encounter Date: 04/27/2021   PT End of Session - 04/27/21 1618     Visit Number 10    Number of Visits 18    Date for PT Re-Evaluation 06/22/21    Authorization Type BCBS    PT Start Time 1532    PT Stop Time 1696    PT Time Calculation (min) 42 min    Activity Tolerance Patient tolerated treatment well;Patient limited by pain    Behavior During Therapy [facility] for tasks assessed/performed             Past Medical History:  Diagnosis Date   Hyperlipidemia    Hypertension    UTI (urinary tract infection)     Past Surgical History:  Procedure Laterality Date   URETHRA SURGERY     went in clean make sure urethra is clear    There were no vitals filed for this visit.   Subjective Assessment - 04/27/21 1534     Subjective No new changes since last session. Reports very slight improvement in pain levels since initial eval- 20%. Feels like now he knows about somethings to give him rleief, but it is short-lasting.    Pertinent History HLD, HTN    Diagnostic tests none recent    Patient Stated Goals decrease pain    Currently in Pain? No/denies                                          PT Education - 04/27/21 1617     Education Details discussion on objective progress and remaining impairments; advised patient to f/u with PCP d/t slower than expected benefit from PT    Person(s) Educated Patient    Methods Explanation;Demonstration;Tactile cues;Verbal cues;Handout    Comprehension Verbalized understanding;Returned demonstration              PT Short Term Goals - 04/27/21 1622       PT SHORT TERM GOAL #1   Title Patient to  be independent with initial HEP.    Status Achieved               PT Long Term Goals - 04/27/21 1622       PT LONG TERM GOAL #1   Title Patient to be independent with advanced HEP.    Time 8    Period Weeks    Status On-going    Target Date 06/22/21      PT LONG TERM GOAL #2   Title Patient to demonstrate lumbar ROM WFL and without pain limiting.    Time 6    Period Weeks    Status On-going   pain still remains; small decline in extension and rotation   Target Date 06/22/21      PT LONG TERM GOAL #3   Title Patient to demonstrate B LE strength >/=4+/5.    Time 8    Period Weeks    Status Partially Met   improved in hip ABD/ADD   Target Date 06/22/21      PT LONG TERM GOAL #4   Title Patient to demonstrate mild-moderate B piriformis  tightness with stretching remaining.    Time 8    Period Weeks    Status Partially Met   improved, still limited   Target Date 06/22/21      PT LONG TERM GOAL #5   Title Patient to report 70% improvement in pain.    Time 8    Period Weeks    Status On-going   20%   Target Date 06/22/21      PT LONG TERM GOAL #6   Title Patient to report tolerance for 5 mile run without pain limiting.    Time 8    Period Weeks    Status On-going   not running but has tolerated 5 mile walks   Target Date 06/22/21                   Plan - 04/27/21 1619     Clinical Impression Statement Patient reports about 20% improvement in pain levels since starting therapy. Reports that he has found some tools that provide him with relief, but it is short-lasting. Patient reported good tolerance of DN and able to proceed with remainder of session. Lumbar AROM has declined slightly in extension and rotation and pain still remains. Strength testing revealed improvement in B hip abduction and adduction, however with marked weakness in B hip extension and mildly in hip flexion. Piriformis flexibility has improved B, but is still tight. Patient reports that  he has not done any running recently, but has been tolerating 5 mile walks. Spoke to patient about his current progress and remaining impairments and advised him to f/u with his PCP d/t slower than expected progress. Despite this, patient would benefit from additional skilled PT services 1x/week for 8 weeks to address remaining deficits including weakness, limited flexibility, and improved motor/core control.    Personal Factors and Comorbidities Age;Fitness;Past/Current Experience;Comorbidity 2;Profession;Time since onset of injury/illness/exacerbation    Comorbidities HLD, HTN    Examination-Activity Limitations Bed Mobility;Reach Overhead;Transfers;Bend;Sit;Carry;Sleep;Squat;Stand;Stairs;Lift    Examination-Participation Restrictions [name];Shop;Driving;Community Activity;Cleaning;Occupation;Church;Meal Prep    Stability/Clinical Decision Making Evolving/Moderate complexity    Rehab Potential Good    PT Frequency 1x / week    PT Duration 8 weeks    PT Treatment/Interventions ADLs/Self Care Home Management;Cryotherapy;Electrical Stimulation;Ultrasound;Traction;Moist Heat;Gait training;Stair training;Functional mobility training;Therapeutic activities;Therapeutic exercise;Balance training;Neuromuscular re-education;Manual techniques;Patient/family education;Passive range of motion;Dry needling;Energy conservation;Taping    PT Next Visit Plan progress hip & core strengthening, assess response to DN    Consulted and Agree with Plan of Care Patient           PT got consent and educated about trigger point dry needling, I dry needled the lumbar erector spinae and the buttocks on the left, had good LTR's.  No adverse reactions. Lum Babe, PT  Patient will benefit from skilled therapeutic intervention in order to improve the following deficits and impairments:  Abnormal gait, Decreased range of motion, Difficulty walking, Increased fascial restricitons, Increased muscle spasms,  Decreased safety awareness, Decreased activity tolerance, Pain, Improper body mechanics, Impaired flexibility, Hypomobility, Decreased strength, Postural dysfunction  Visit Diagnosis: Difficulty in walking, not elsewhere classified - Plan: PT plan of care cert/re-cert  Muscle weakness (generalized) - Plan: PT plan of care cert/re-cert  Chronic bilateral low back pain with bilateral sciatica - Plan: PT plan of care cert/re-cert  Abnormal posture - Plan: PT plan of care cert/re-cert     Problem List Patient Active Problem List   Diagnosis Date Noted   Chronic right-sided low back pain 12/22/2020   Hx  of colonic polyp 12/10/2018   AC joint arthropathy 11/26/2018   Need for prophylactic vaccination against Streptococcus pneumoniae (pneumococcus) and influenza 84/10/9793   Complicated UTI (urinary tract infection) 08/29/2017   Erectile disorder due to medical condition in male 08/29/2017   Shift work sleep disorder 09/04/2013   Essential hypertension 09/06/2011   BPH (benign prostatic hyperplasia) 09/06/2011   Pure hypercholesterolemia 09/06/2011   Hypogonadism male 09/06/2011     Janene Harvey, PT, DPT 04/28/21 12:03 PM   [address]. [place], [place], [address] Phone: [phone]   Fax:  [phone]  Name: Jamani L Rice MRN: [id number] Date of Birth: 1950/12/15

## 2021-05-03 LAB — HM COLONOSCOPY

## 2021-05-05 ENCOUNTER — Ambulatory Visit: Payer: BC Managed Care – PPO | Admitting: Physical Therapy

## 2021-05-05 ENCOUNTER — Other Ambulatory Visit: Payer: Self-pay

## 2021-05-05 DIAGNOSIS — G8929 Other chronic pain: Secondary | ICD-10-CM

## 2021-05-05 DIAGNOSIS — M6281 Muscle weakness (generalized): Secondary | ICD-10-CM

## 2021-05-05 DIAGNOSIS — R293 Abnormal posture: Secondary | ICD-10-CM

## 2021-05-05 DIAGNOSIS — R262 Difficulty in walking, not elsewhere classified: Secondary | ICD-10-CM

## 2021-05-05 NOTE — Therapy (Signed)
[place]. [place], [place], [address] Phone: [phone]   Fax:  [phone]  Physical Therapy Treatment  Patient Details  Name: Cameron Rice MRN: [id number] Date of Birth: 02-18-1951 Referring Provider (PT): Nche, Charlene Brooke, NP   Encounter Date: 05/05/2021   PT End of Session - 05/05/21 0929     Visit Number 11    Number of Visits 18    Date for PT Re-Evaluation 06/22/21    Authorization Type BCBS    PT Start Time 0849    PT Stop Time 0928    PT Time Calculation (min) 39 min    Activity Tolerance Patient tolerated treatment well    Behavior During Therapy [facility] for tasks assessed/performed             Past Medical History:  Diagnosis Date   Hyperlipidemia    Hypertension    UTI (urinary tract infection)     Past Surgical History:  Procedure Laterality Date   URETHRA SURGERY     went in clean make sure urethra is clear    There were no vitals filed for this visit.   Subjective Assessment - 05/05/21 0852     Subjective Feels like he saw a slight improvement from DN, but wishes that it was done on the R as he feels that the pain originates there.    Pertinent History HLD, HTN    Diagnostic tests none recent    Patient Stated Goals decrease pain    Currently in Pain? Yes    Pain Score 2     Pain Location Leg    Pain Orientation Right;Left;Posterior    Pain Descriptors / Indicators Aching    Pain Type Chronic pain                               [facility] Adult PT Treatment/Exercise - 05/05/21 0001       Lumbar Exercises: Aerobic   Tread Mill 2.9 mph x 5 min   holding onto rails     Lumbar Exercises: Supine   Glut Set 10 reps    Glut Set Limitations 10x5"   improved   Bridge 10 reps    Bridge Limitations initiating with glute squeeze      Lumbar Exercises: Sidelying   Hip Abduction Right;Left;10 reps    Hip Abduction Limitations glute med bias   cues for    Other Sidelying Lumbar Exercises R/L hip adduction 10x each with opposite LE elevated on bolster      Lumbar Exercises: Prone   Other Prone Lumbar Exercises prone over pball 10x   CGA     Lumbar Exercises: Quadruped   Single Arm Raise Right;Left;10 reps    Single Arm Raises Limitations cues to maintain neutral spine    Straight Leg Raise 10 reps    Straight Leg Raises Limitations cueing to avoid hip rotation and lumbar lordosis    Opposite Arm/Leg Raise --    Other Quadruped Lumbar Exercises child's pose 5x5" with 2 folded pillowed under knees to avoid pain                       PT Short Term Goals - 04/27/21 1622       PT SHORT TERM GOAL #1   Title Patient to be independent with initial HEP.    Status Achieved  PT Long Term Goals - 04/27/21 1622       PT LONG TERM GOAL #1   Title Patient to be independent with advanced HEP.    Time 8    Period Weeks    Status On-going    Target Date 06/22/21      PT LONG TERM GOAL #2   Title Patient to demonstrate lumbar ROM WFL and without pain limiting.    Time 6    Period Weeks    Status On-going   pain still remains; small decline in extension and rotation   Target Date 06/22/21      PT LONG TERM GOAL #3   Title Patient to demonstrate B LE strength >/=4+/5.    Time 8    Period Weeks    Status Partially Met   improved in hip ABD/ADD   Target Date 06/22/21      PT LONG TERM GOAL #4   Title Patient to demonstrate mild-moderate B piriformis tightness with stretching remaining.    Time 8    Period Weeks    Status Partially Met   improved, still limited   Target Date 06/22/21      PT LONG TERM GOAL #5   Title Patient to report 70% improvement in pain.    Time 8    Period Weeks    Status On-going   20%   Target Date 06/22/21      PT LONG TERM GOAL #6   Title Patient to report tolerance for 5 mile run without pain limiting.    Time 8    Period Weeks    Status On-going   not running but has  tolerated 5 mile walks   Target Date 06/22/21                   Plan - 05/05/21 0930     Clinical Impression Statement Patient arrived to session with report of slight improvement from DN. Reviewed glute sets with patient demonstrating improved understanding and carryover. Bridges focused on initiation of glute contraction first, with good carryover as well. Proceeded with hip strengthening with focus on proper alignment and muscle control throughout. Patient able to perform these with good care. Patient was able to perform activities requiring hip extension without c/o pain d/t modification of positioning. Updated HEP with exercise that was well-tolerated today. Patient reported understanding and without complaints at end of session.    Personal Factors and Comorbidities Age;Fitness;Past/Current Experience;Comorbidity 2;Profession;Time since onset of injury/illness/exacerbation    Comorbidities HLD, HTN    Examination-Activity Limitations Bed Mobility;Reach Overhead;Transfers;Bend;Sit;Carry;Sleep;Squat;Stand;Stairs;Lift    Examination-Participation Restrictions [name];Shop;Driving;Community Activity;Cleaning;Occupation;Church;Meal Prep    Stability/Clinical Decision Making Evolving/Moderate complexity    Rehab Potential Good    PT Frequency 1x / week    PT Duration 8 weeks    PT Treatment/Interventions ADLs/Self Care Home Management;Cryotherapy;Electrical Stimulation;Ultrasound;Traction;Moist Heat;Gait training;Stair training;Functional mobility training;Therapeutic activities;Therapeutic exercise;Balance training;Neuromuscular re-education;Manual techniques;Patient/family education;Passive range of motion;Dry needling;Energy conservation;Taping    PT Next Visit Plan try DN to R buttock and LB; progress hip & core strengthening    Consulted and Agree with Plan of Care Patient             Patient will benefit from skilled therapeutic intervention in order to improve the  following deficits and impairments:  Abnormal gait, Decreased range of motion, Difficulty walking, Increased fascial restricitons, Increased muscle spasms, Decreased safety awareness, Decreased activity tolerance, Pain, Improper body mechanics, Impaired flexibility, Hypomobility, Decreased strength, Postural dysfunction  Visit  Diagnosis: Difficulty in walking, not elsewhere classified  Muscle weakness (generalized)  Chronic bilateral low back pain with bilateral sciatica  Abnormal posture     Problem List Patient Active Problem List   Diagnosis Date Noted   Chronic right-sided low back pain 12/22/2020   Hx of colonic polyp 12/10/2018   AC joint arthropathy 11/26/2018   Need for prophylactic vaccination against Streptococcus pneumoniae (pneumococcus) and influenza 06/09/1593   Complicated UTI (urinary tract infection) 08/29/2017   Erectile disorder due to medical condition in male 08/29/2017   Shift work sleep disorder 09/04/2013   Essential hypertension 09/06/2011   BPH (benign prostatic hyperplasia) 09/06/2011   Pure hypercholesterolemia 09/06/2011   Hypogonadism male 09/06/2011   Janene Harvey, PT, DPT 05/05/21 9:31 AM   [place]. [place], [place], [address] Phone: [phone]   Fax:  [phone]  Name: Cameron Rice MRN: [id number] Date of Birth: [birth year]

## 2021-05-10 ENCOUNTER — Other Ambulatory Visit: Payer: Self-pay

## 2021-05-10 ENCOUNTER — Encounter: Payer: Self-pay | Admitting: Physical Therapy

## 2021-05-10 ENCOUNTER — Ambulatory Visit: Payer: BC Managed Care – PPO | Attending: Nurse Practitioner | Admitting: Physical Therapy

## 2021-05-10 DIAGNOSIS — M5441 Lumbago with sciatica, right side: Secondary | ICD-10-CM | POA: Insufficient documentation

## 2021-05-10 DIAGNOSIS — R293 Abnormal posture: Secondary | ICD-10-CM | POA: Insufficient documentation

## 2021-05-10 DIAGNOSIS — M5442 Lumbago with sciatica, left side: Secondary | ICD-10-CM | POA: Insufficient documentation

## 2021-05-10 DIAGNOSIS — G8929 Other chronic pain: Secondary | ICD-10-CM | POA: Diagnosis present

## 2021-05-10 DIAGNOSIS — R262 Difficulty in walking, not elsewhere classified: Secondary | ICD-10-CM | POA: Diagnosis not present

## 2021-05-10 DIAGNOSIS — M6281 Muscle weakness (generalized): Secondary | ICD-10-CM | POA: Diagnosis present

## 2021-05-10 NOTE — Therapy (Signed)
[place]. [place], [place], [address] Phone: [phone]   Fax:  [phone]  Physical Therapy Treatment  Patient Details  Name: Cameron Rice MRN: [id number] Date of Birth: 03-Aug-1951 Referring Provider (PT): Nche, Charlene Brooke, NP   Encounter Date: 05/10/2021   PT End of Session - 05/10/21 1045     Visit Number 12    Number of Visits 18    Date for PT Re-Evaluation 06/22/21    PT Start Time 0934    PT Stop Time 1010    PT Time Calculation (min) 36 min    Activity Tolerance Patient tolerated treatment well    Behavior During Therapy [facility] for tasks assessed/performed             Past Medical History:  Diagnosis Date   Hyperlipidemia    Hypertension    UTI (urinary tract infection)     Past Surgical History:  Procedure Laterality Date   URETHRA SURGERY     went in clean make sure urethra is clear    There were no vitals filed for this visit.   Subjective Assessment - 05/10/21 0939     Subjective Patient reports continued pain in B hips, also R knee pain, which is chronic issue that comes and goes.    Currently in Pain? Yes    Pain Location Leg    Pain Orientation Left;Proximal;Posterior    Pain Descriptors / Indicators Aching    Pain Type Chronic pain                               [facility] Adult PT Treatment/Exercise - 05/10/21 1029       Lumbar Exercises: Aerobic   Tread Mill 2.9 mph x 6 min      Lumbar Exercises: Standing   Other Standing Lumbar Exercises abduction against green T band resistance 5 x each leg      Lumbar Exercises: Supine   Bridge 10 reps    Bridge Limitations Held bridge while walking feet out and back, TC to maintain level pelvis. 2 x 3 steps forward and back.    Other Supine Lumbar Exercises SLR stretch using strap around foot into flexion abd and add, hold 30 seconds    Other Supine Lumbar Exercises Figure 4 stretch, diagonal knee to chest  stretch in supine and sitting.      Lumbar Exercises: Sidelying   Clam Both;20 reps    Clam Limitations green T band              Trigger Point Dry Needling - 05/10/21 0001     Consent Given? Yes    Education Handout Provided Yes    Muscles Treated Back/Hip Gluteus medius;Gluteus maximus;Piriformis    Gluteus Medius Response Palpable increased muscle length    Gluteus Maximus Response Twitch response elicited;Palpable increased muscle length    Piriformis Response Twitch response elicited;Palpable increased muscle length                   PT Education - 05/10/21 1044     Person(s) Educated Patient    Methods Explanation;Demonstration;Tactile cues;Verbal cues    Comprehension Verbalized understanding;Returned demonstration              PT Short Term Goals - 05/10/21 1051       PT SHORT TERM GOAL #1   Title Patient to be independent with initial HEP.  Status Partially Met               PT Long Term Goals - 04/27/21 1622       PT LONG TERM GOAL #1   Title Patient to be independent with advanced HEP.    Time 8    Period Weeks    Status On-going    Target Date 06/22/21      PT LONG TERM GOAL #2   Title Patient to demonstrate lumbar ROM WFL and without pain limiting.    Time 6    Period Weeks    Status On-going   pain still remains; small decline in extension and rotation   Target Date 06/22/21      PT LONG TERM GOAL #3   Title Patient to demonstrate B LE strength >/=4+/5.    Time 8    Period Weeks    Status Partially Met   improved in hip ABD/ADD   Target Date 06/22/21      PT LONG TERM GOAL #4   Title Patient to demonstrate mild-moderate B piriformis tightness with stretching remaining.    Time 8    Period Weeks    Status Partially Met   improved, still limited   Target Date 06/22/21      PT LONG TERM GOAL #5   Title Patient to report 70% improvement in pain.    Time 8    Period Weeks    Status On-going   20%   Target Date  06/22/21      PT LONG TERM GOAL #6   Title Patient to report tolerance for 5 mile run without pain limiting.    Time 8    Period Weeks    Status On-going   not running but has tolerated 5 mile walks   Target Date 06/22/21                   Plan - 05/10/21 1047     Clinical Impression Statement Patient reprots continued pain in B posterior thighs. Practiced bridging, added walk out and back while maintaining level pelvis. Instructed in clamshells in sidelie, standing abd, both with G TBand, Emphasized stretching of low back and hips, introduced figure 4 stretch in supine and sit. Patient received DN for pain relief, reported some localized tnederness after treatment.    Personal Factors and Comorbidities Age;Fitness;Past/Current Experience;Comorbidity 2;Profession;Time since onset of injury/illness/exacerbation    Comorbidities HLD, HTN    PT Treatment/Interventions ADLs/Self Care Home Management;Cryotherapy;Electrical Stimulation;Ultrasound;Traction;Moist Heat;Gait training;Stair training;Functional mobility training;Therapeutic activities;Therapeutic exercise;Balance training;Neuromuscular re-education;Manual techniques;Patient/family education;Passive range of motion;Dry needling;Energy conservation;Taping    PT Next Visit Plan Update HEP with appropriate stretching that will not bother R knee, soft tissue mobilization for hips             Patient will benefit from skilled therapeutic intervention in order to improve the following deficits and impairments:  Abnormal gait, Difficulty walking, Decreased range of motion, Increased fascial restricitons, Pain, Impaired flexibility, Decreased mobility, Postural dysfunction  Visit Diagnosis: Difficulty in walking, not elsewhere classified  Muscle weakness (generalized)  Chronic bilateral low back pain with bilateral sciatica  Abnormal posture     Problem List Patient Active Problem List   Diagnosis Date Noted   Chronic  right-sided low back pain 12/22/2020   Hx of colonic polyp 12/10/2018   AC joint arthropathy 11/26/2018   Need for prophylactic vaccination against Streptococcus pneumoniae (pneumococcus) and influenza 65/46/5035   Complicated UTI (urinary tract infection) 08/29/2017  Erectile disorder due to medical condition in male 08/29/2017   Shift work sleep disorder 09/04/2013   Essential hypertension 09/06/2011   BPH (benign prostatic hyperplasia) 09/06/2011   Pure hypercholesterolemia 09/06/2011   Hypogonadism male 09/06/2011    Marcelina Morel, DPT 05/10/2021, 10:55 AM  [address]. [place], [place], [address] Phone: [phone]   Fax:  [phone]  Name: Cameron Rice MRN: [id number] Date of Birth: 09-28-1950

## 2021-05-10 NOTE — Patient Instructions (Signed)
Instructed in sidelying clamshells with green Tband resistance, standing hip abd with T band resistance, bridging with walk outs, figure 4 stretch, SLR stretch with strap for hip flexion, abd, add

## 2021-05-27 ENCOUNTER — Encounter: Payer: Self-pay | Admitting: Physical Therapy

## 2021-05-27 ENCOUNTER — Other Ambulatory Visit: Payer: Self-pay

## 2021-05-27 ENCOUNTER — Ambulatory Visit: Payer: BC Managed Care – PPO | Admitting: Physical Therapy

## 2021-05-27 DIAGNOSIS — R262 Difficulty in walking, not elsewhere classified: Secondary | ICD-10-CM

## 2021-05-27 DIAGNOSIS — R293 Abnormal posture: Secondary | ICD-10-CM

## 2021-05-27 DIAGNOSIS — M6281 Muscle weakness (generalized): Secondary | ICD-10-CM

## 2021-05-27 DIAGNOSIS — G8929 Other chronic pain: Secondary | ICD-10-CM

## 2021-05-27 DIAGNOSIS — M5442 Lumbago with sciatica, left side: Secondary | ICD-10-CM

## 2021-05-27 NOTE — Therapy (Signed)
[place] [place], [place], [address] Phone: [phone]   Fax:  [phone]  Physical Therapy Treatment  Patient Details  Name: Cameron Rice MRN: [id number] Date of Birth: [birth year] Referring Provider (PT): Nche, Charlene Brooke, NP   Encounter Date: 05/27/2021   PT End of Session - 05/27/21 1043     Visit Number 13    Number of Visits 18    Date for PT Re-Evaluation 06/22/21    PT Start Time 0801    PT Stop Time 8127    PT Time Calculation (min) 46 min    Activity Tolerance Patient tolerated treatment well    Behavior During Therapy [facility] for tasks assessed/performed             Past Medical History:  Diagnosis Date   Hyperlipidemia    Hypertension    UTI (urinary tract infection)     Past Surgical History:  Procedure Laterality Date   URETHRA SURGERY     went in clean make sure urethra is clear    There were no vitals filed for this visit.   Subjective Assessment - 05/27/21 0800     Subjective Patient had been on vacation for a couple weeks and felt better. He worked for a day and now feels stiffness in back of his legs.    Pertinent History HLD, HTN    Limitations Sitting;Lifting;Standing;Walking;House hold activities    Currently in Pain? Yes    Pain Location Leg    Pain Orientation Left;Right;Posterior;Proximal;Mid    Pain Descriptors / Indicators Tightness    Pain Type Chronic pain    Pain Onset More than a month ago    Pain Frequency Intermittent    Aggravating Factors  Work x 12 hours    Pain Relieving Factors vacation                               [facility] Adult PT Treatment/Exercise - 05/27/21 0001       Lumbar Exercises: Stretches   Passive Hamstring Stretch Right;Left;1 rep;60 seconds    Passive Hamstring Stretch Limitations supine with strap    Single Knee to Chest Stretch Left;Right;3 reps;10 seconds    Double Knee to Chest Stretch 2 reps;20  seconds      Lumbar Exercises: Aerobic   Tread Mill 2.5 mph x 6 min      Lumbar Exercises: Quadruped   Single Arm Raise Left;Right;5 reps    Straight Leg Raise 5 reps    Straight Leg Raises Limitations Increased difficulty when lifting LLE.    Opposite Arm/Leg Raise Right arm/Left leg;Left arm/Right leg;5 reps    Opposite Arm/Leg Raise Limitations Fatigued after 5 reps.      Manual Therapy   Manual therapy comments Patient used foam roller for low back, hips and legs. Therapist provided demonstration, VC and TC to facilitate effectiveness.                     PT Education - 05/27/21 1042     Education Details Updated HEP, foam rolling    Person(s) Educated Patient    Methods Explanation;Demonstration;Handout    Comprehension Verbalized understanding;Returned demonstration              PT Short Term Goals - 05/27/21 1045       PT SHORT TERM GOAL #1   Title Patient to be independent with initial  HEP.    Status Achieved      PT SHORT TERM GOAL #2   Title I with updated HEP    Baseline Program updated with stretching/strength    Time 2    Period Weeks    Status New    Target Date 06/10/21               PT Long Term Goals - 04/27/21 1622       PT LONG TERM GOAL #1   Title Patient to be independent with advanced HEP.    Time 8    Period Weeks    Status On-going    Target Date 06/22/21      PT LONG TERM GOAL #2   Title Patient to demonstrate lumbar ROM WFL and without pain limiting.    Time 6    Period Weeks    Status On-going   pain still remains; small decline in extension and rotation   Target Date 06/22/21      PT LONG TERM GOAL #3   Title Patient to demonstrate B LE strength >/=4+/5.    Time 8    Period Weeks    Status Partially Met   improved in hip ABD/ADD   Target Date 06/22/21      PT LONG TERM GOAL #4   Title Patient to demonstrate mild-moderate B piriformis tightness with stretching remaining.    Time 8    Period Weeks     Status Partially Met   improved, still limited   Target Date 06/22/21      PT LONG TERM GOAL #5   Title Patient to report 70% improvement in pain.    Time 8    Period Weeks    Status On-going   20%   Target Date 06/22/21      PT LONG TERM GOAL #6   Title Patient to report tolerance for 5 mile run without pain limiting.    Time 8    Period Weeks    Status On-going   not running but has tolerated 5 mile walks   Target Date 06/22/21                   Plan - 05/27/21 1043     Clinical Impression Statement Patient reports fluctuating pain and stiffness, but it was improved while on vacation. Tolerated foam rolling well, with stretching and stabilization activities following .    Personal Factors and Comorbidities Age;Fitness;Past/Current Experience;Comorbidity 2;Profession;Time since onset of injury/illness/exacerbation    Comorbidities HLD, HTN    Examination-Activity Limitations Bed Mobility;Reach Overhead;Transfers;Bend;Sit;Carry;Sleep;Squat;Stand;Stairs;Lift    Examination-Participation Restrictions [name];Shop;Driving;Community Activity;Cleaning;Occupation;Church;Meal Prep    Stability/Clinical Decision Making Evolving/Moderate complexity    Clinical Decision Making Moderate    Rehab Potential Good    PT Frequency 1x / week    PT Duration Other (comment)   5w   PT Treatment/Interventions ADLs/Self Care Home Management;Cryotherapy;Electrical Stimulation;Ultrasound;Traction;Moist Heat;Gait training;Stair training;Functional mobility training;Therapeutic activities;Therapeutic exercise;Balance training;Neuromuscular re-education;Manual techniques;Patient/family education;Passive range of motion;Dry needling;Energy conservation;Taping    PT Next Visit Plan Assess tolerance to HEP, dynamic/standing stabilization activities    Consulted and Agree with Plan of Care Patient             Patient will benefit from skilled therapeutic intervention in order to improve  the following deficits and impairments:  Abnormal gait, Difficulty walking, Decreased range of motion, Increased fascial restricitons, Pain, Impaired flexibility, Decreased mobility, Postural dysfunction  Visit Diagnosis: Difficulty in walking, not elsewhere classified  Muscle weakness (generalized)  Chronic bilateral low back pain with bilateral sciatica  Abnormal posture     Problem List Patient Active Problem List   Diagnosis Date Noted   Chronic right-sided low back pain 12/22/2020   Hx of colonic polyp 12/10/2018   AC joint arthropathy 11/26/2018   Need for prophylactic vaccination against Streptococcus pneumoniae (pneumococcus) and influenza 14/97/0263   Complicated UTI (urinary tract infection) 08/29/2017   Erectile disorder due to medical condition in male 08/29/2017   Shift work sleep disorder 09/04/2013   Essential hypertension 09/06/2011   BPH (benign prostatic hyperplasia) 09/06/2011   Pure hypercholesterolemia 09/06/2011   Hypogonadism male 09/06/2011    Marcelina Morel, PDT 05/27/2021, 10:47 AM  [place]. [place], [place], [address] Phone: [phone]   Fax:  [phone]  Name: Carlton L Rice MRN: [id number] Date of Birth: 05-Jul-1951

## 2021-05-27 NOTE — Patient Instructions (Signed)
Access Code: [email or web address] URL: [email or web address] Date: 05/27/2021 Prepared by: Ethel Rana  Exercises Sidelying IT Band Foam Roll Mobilization - 1 x daily - 7 x weekly - 1 sets - 10 reps Piriformis Mobilization on Foam Roll - 1 x daily - 7 x weekly - 1 sets - 10 reps Calf Mobilization with Foam Roll - 1 x daily - 7 x weekly - 1 sets - 10 reps Hamstring Mobilization with Foam Roll - 1 x daily - 7 x weekly - 1 sets - 10 reps Figure 4 Gluteus Mobilization on Foam Roll - 1 x daily - 7 x weekly - 1 sets - 10 reps Quadriceps Mobilization with Foam Roll - 1 x daily - 7 x weekly - 1 sets - 10 reps Thoracic Mobilization with Hands Behind Head on Foam Roll - 1 x daily - 7 x weekly - 1 sets - 10 reps  Access Code: [email or web address] URL: [email or web address] Date: 05/27/2021 Prepared by: Ethel Rana  Exercises Quadruped Alternating Leg Extensions - 1 x daily - 7 x weekly - 3 sets - 10 reps Quadruped Alternating Shoulder Flexion - 1 x daily - 7 x weekly - 3 sets - 10 reps Bird Dog - 1 x daily - 7 x weekly - 3 sets - 10 reps

## 2021-06-02 ENCOUNTER — Ambulatory Visit: Payer: BC Managed Care – PPO | Admitting: Physical Therapy

## 2021-06-02 ENCOUNTER — Encounter: Payer: Self-pay | Admitting: Physical Therapy

## 2021-06-02 ENCOUNTER — Other Ambulatory Visit: Payer: Self-pay

## 2021-06-02 DIAGNOSIS — M6281 Muscle weakness (generalized): Secondary | ICD-10-CM

## 2021-06-02 DIAGNOSIS — R293 Abnormal posture: Secondary | ICD-10-CM

## 2021-06-02 DIAGNOSIS — R262 Difficulty in walking, not elsewhere classified: Secondary | ICD-10-CM

## 2021-06-02 DIAGNOSIS — M5441 Lumbago with sciatica, right side: Secondary | ICD-10-CM

## 2021-06-02 DIAGNOSIS — G8929 Other chronic pain: Secondary | ICD-10-CM

## 2021-06-02 NOTE — Therapy (Signed)
[place]. [place], [place], [address] Phone: [phone]   Fax:  [phone]  Physical Therapy Treatment  Patient Details  Name: Cameron Rice MRN: [id number] Date of Birth: Mar 30, 1951 Referring Provider (PT): Nche, Charlene Brooke, NP   Encounter Date: 06/02/2021   PT End of Session - 06/02/21 0928     Visit Number 14    Number of Visits 18    Date for PT Re-Evaluation 06/22/21    PT Start Time 0847    PT Stop Time 0930    PT Time Calculation (min) 43 min    Equipment Utilized During Treatment Gait belt    Activity Tolerance Patient tolerated treatment well    Behavior During Therapy [facility] for tasks assessed/performed             Past Medical History:  Diagnosis Date   Hyperlipidemia    Hypertension    UTI (urinary tract infection)     Past Surgical History:  Procedure Laterality Date   URETHRA SURGERY     went in clean make sure urethra is clear    There were no vitals filed for this visit.   Subjective Assessment - 06/02/21 0850     Subjective Patient reports he is feeling. His legs were tired after last treatment, but did recover.    Pertinent History HLD, HTN    Limitations Sitting;Lifting;Standing;Walking;House hold activities    Currently in Pain? Yes    Pain Location Knee    Pain Orientation Right;Left    Pain Descriptors / Indicators Heaviness;Aching    Pain Type Chronic pain    Pain Onset More than a month ago    Pain Frequency Intermittent    Aggravating Factors  ran/walked 5 miles                               [facility] Adult PT Treatment/Exercise - 06/02/21 0001       Exercises   Exercises Knee/Hip      Lumbar Exercises: Stretches   Passive Hamstring Stretch Right;Left;1 rep;30 seconds    Passive Hamstring Stretch Limitations sitting    ITB Stretch Right;Left;1 rep;30 seconds    ITB Stretch Limitations sitting    Figure 4 Stretch 1 rep;60 seconds     Figure 4 Stretch Limitations sitting      Lumbar Exercises: Aerobic   Tread Mill 2.9 mph x 6 min      Knee/Hip Exercises: Stretches   Other Knee/Hip Stretches Soft tissue mobilizaiton with roller/stick.      Knee/Hip Exercises: Standing   Hip ADduction Strengthening;Both;1 set;10 reps    Hip ADduction Limitations hip drops on step. He had difficulty isolating pelvis.    Forward Step Up Both;1 set;10 reps    Forward Step Up Limitations step up into runners stance on Air Ex pad.    Other Standing Knee Exercises Stand at wall, with back against large therapeutic ball. Maintin posture and balance, while lifting one knee up, and into ER. 10 reps each.      Manual Therapy   Manual Therapy Soft tissue mobilization;Myofascial release    Manual therapy comments Provided education to use tools for MFR to ITB especially.                     PT Education - 06/02/21 0928     Education Details self MF using rollers, tennis ball.  Person(s) Educated Patient    Methods Explanation;Demonstration    Comprehension Verbalized understanding;Returned demonstration              PT Short Term Goals - 06/02/21 0933       PT SHORT TERM GOAL #2   Title I with updated HEP    Time 1    Period Weeks    Status On-going    Target Date 06/10/21               PT Long Term Goals - 04/27/21 1622       PT LONG TERM GOAL #1   Title Patient to be independent with advanced HEP.    Time 8    Period Weeks    Status On-going    Target Date 06/22/21      PT LONG TERM GOAL #2   Title Patient to demonstrate lumbar ROM WFL and without pain limiting.    Time 6    Period Weeks    Status On-going   pain still remains; small decline in extension and rotation   Target Date 06/22/21      PT LONG TERM GOAL #3   Title Patient to demonstrate B LE strength >/=4+/5.    Time 8    Period Weeks    Status Partially Met   improved in hip ABD/ADD   Target Date 06/22/21      PT LONG TERM  GOAL #4   Title Patient to demonstrate mild-moderate B piriformis tightness with stretching remaining.    Time 8    Period Weeks    Status Partially Met   improved, still limited   Target Date 06/22/21      PT LONG TERM GOAL #5   Title Patient to report 70% improvement in pain.    Time 8    Period Weeks    Status On-going   20%   Target Date 06/22/21      PT LONG TERM GOAL #6   Title Patient to report tolerance for 5 mile run without pain limiting.    Time 8    Period Weeks    Status On-going   not running but has tolerated 5 mile walks   Target Date 06/22/21                   Plan - 06/02/21 0929     Clinical Impression Statement Patient had just worked for 12 hours. Particiapted well. Empahsized STM and MF with stretch, followed by single limb stance activities on foam pad to build stability.    Personal Factors and Comorbidities Age;Fitness;Past/Current Experience;Comorbidity 2;Profession;Time since onset of injury/illness/exacerbation    Comorbidities HLD, HTN    Examination-Activity Limitations Bed Mobility;Reach Overhead;Transfers;Bend;Sit;Carry;Sleep;Squat;Stand;Stairs;Lift    Examination-Participation Restrictions [name];Shop;Driving;Community Activity;Cleaning;Occupation;Church;Meal Prep    Stability/Clinical Decision Making Evolving/Moderate complexity    Rehab Potential Good    PT Frequency 1x / week    PT Duration 4 weeks    PT Treatment/Interventions ADLs/Self Care Home Management;Cryotherapy;Electrical Stimulation;Ultrasound;Traction;Moist Heat;Gait training;Stair training;Functional mobility training;Therapeutic activities;Therapeutic exercise;Balance training;Neuromuscular re-education;Manual techniques;Patient/family education;Passive range of motion;Dry needling;Energy conservation;Taping    PT Next Visit Plan Update HEP, stretch, MFR, single limb stabilization.    Consulted and Agree with Plan of Care Patient             Patient will  benefit from skilled therapeutic intervention in order to improve the following deficits and impairments:  Abnormal gait, Difficulty walking, Decreased range of motion, Increased fascial restricitons, Pain,  Impaired flexibility, Decreased mobility, Postural dysfunction  Visit Diagnosis: Difficulty in walking, not elsewhere classified  Muscle weakness (generalized)  Chronic bilateral low back pain with bilateral sciatica  Abnormal posture     Problem List Patient Active Problem List   Diagnosis Date Noted   Chronic right-sided low back pain 12/22/2020   Hx of colonic polyp 12/10/2018   AC joint arthropathy 11/26/2018   Need for prophylactic vaccination against Streptococcus pneumoniae (pneumococcus) and influenza 96/43/8381   Complicated UTI (urinary tract infection) 08/29/2017   Erectile disorder due to medical condition in male 08/29/2017   Shift work sleep disorder 09/04/2013   Essential hypertension 09/06/2011   BPH (benign prostatic hyperplasia) 09/06/2011   Pure hypercholesterolemia 09/06/2011   Hypogonadism male 09/06/2011    Marcelina Morel, PT 06/02/2021, 9:35 AM  [place]. [place], [place], [address] Phone: [phone]   Fax:  [phone]  Name: Cameron Rice MRN: [id number] Date of Birth: 1950/11/15

## 2021-07-01 ENCOUNTER — Encounter: Payer: Self-pay | Admitting: Nurse Practitioner

## 2021-07-04 ENCOUNTER — Ambulatory Visit (INDEPENDENT_AMBULATORY_CARE_PROVIDER_SITE_OTHER): Payer: BC Managed Care – PPO | Admitting: Nurse Practitioner

## 2021-07-04 ENCOUNTER — Other Ambulatory Visit: Payer: Self-pay

## 2021-07-04 VITALS — BP 124/72 | HR 88 | Temp 98.4°F | Resp 14 | Ht 70.0 in | Wt 183.0 lb

## 2021-07-04 DIAGNOSIS — R1084 Generalized abdominal pain: Secondary | ICD-10-CM | POA: Diagnosis not present

## 2021-07-04 DIAGNOSIS — Z23 Encounter for immunization: Secondary | ICD-10-CM

## 2021-07-04 DIAGNOSIS — R350 Frequency of micturition: Secondary | ICD-10-CM | POA: Diagnosis not present

## 2021-07-04 DIAGNOSIS — N3001 Acute cystitis with hematuria: Secondary | ICD-10-CM | POA: Insufficient documentation

## 2021-07-04 LAB — POCT URINALYSIS DIP (CLINITEK)
Bilirubin, UA: NEGATIVE
Glucose, UA: NEGATIVE mg/dL
Ketones, POC UA: NEGATIVE mg/dL
Nitrite, UA: POSITIVE — AB
POC PROTEIN,UA: 30 — AB
Spec Grav, UA: 1.015 (ref 1.010–1.025)
Urobilinogen, UA: 0.2 E.U./dL
pH, UA: 7 (ref 5.0–8.0)

## 2021-07-04 LAB — URINALYSIS, MICROSCOPIC ONLY

## 2021-07-04 MED ORDER — NITROFURANTOIN MONOHYD MACRO 100 MG PO CAPS: 100.0000 mg | ORAL_CAPSULE | Freq: Two times a day (BID) | ORAL | 0 refills | Status: DC

## 2021-07-04 MED ORDER — IBUPROFEN 800 MG PO TABS: 800.0000 mg | ORAL_TABLET | Freq: Two times a day (BID) | ORAL | 0 refills | Status: AC | PRN

## 2021-07-04 NOTE — Assessment & Plan Note (Signed)
Specific abdominal pain.  Patient did endorse having this when he has urinary tract infections.  Exam benign right short course of ibuprofen as it seems to help patient.  Did give precautions of avoiding other NSAIDs with medication.  Continue to monitor.  Discussed signs and symptoms when to seek urgent or emergent health care.

## 2021-07-04 NOTE — Progress Notes (Signed)
Acute Office Visit  Subjective:    Patient ID: Cameron Rice, male    DOB: October 10, 1950, 70 y.o.   MRN: [id number]  Chief Complaint  Patient presents with   Urinary Frequency    Sx started 06/29/21, Urgency, abdominal pain, strong odor to the urine.     Patient is in today for urinary complaints Symptoms started 06/29/2021 Followed by Urology at [facility], Dr. Harrison Mons. Sees them yearly  History of UTI. No history of Prostatis. Is followed for BPH and has had a biopsy performed per patient report  Has tried AZO and ibuprofen with some relief. Requesting a short course of script ibuprofen  Past Medical History:  Diagnosis Date   Hyperlipidemia    Hypertension    UTI (urinary tract infection)     Past Surgical History:  Procedure Laterality Date   URETHRA SURGERY     went in clean make sure urethra is clear    Family History  Problem Relation Age of Onset   Cancer Mother    Cancer Brother        liver cancer   Cancer Maternal Aunt        lung    Social History   Socioeconomic History   Marital status: Married    Spouse name: Not on file   Number of children: Not on file   Years of education: Not on file   Highest education level: Not on file  Occupational History   Not on file  Tobacco Use   Smoking status: Never   Smokeless tobacco: Never  Vaping Use   Vaping Use: Never used  Substance and Sexual Activity   Alcohol use: No   Drug use: No   Sexual activity: Yes  Other Topics Concern   Not on file  Social History Narrative   Not on file   Social Determinants of Health   Financial Resource Strain: Not on file  Food Insecurity: Not on file  Transportation Needs: Not on file  Physical Activity: Not on file  Stress: Not on file  Social Connections: Not on file  Intimate Partner Violence: Not on file    Outpatient Medications Prior to Visit  Medication Sig Dispense Refill   amLODipine-benazepril (LOTREL) 10-20 MG capsule Take 1 capsule by  mouth daily. 90 capsule 3   atorvastatin (LIPITOR) 40 MG tablet TAKE 1 TABLET DAILY AT 6PM (NO ADDITIONAL REFILL WITHOUT OFFICE VISIT) 90 tablet 3   meloxicam (MOBIC) 7.5 MG tablet Take 1 tablet (7.5 mg total) by mouth daily. 30 tablet 0   methocarbamol (ROBAXIN) 750 MG tablet Take 1 tablet (750 mg total) by mouth at bedtime as needed for muscle spasms. 30 tablet 2   Multiple Vitamins-Minerals (ONE-A-DAY MENS 50+ ADVANTAGE) TABS      oxybutynin (DITROPAN-XL) 5 MG 24 hr tablet TAKE 1 TABLET DAILY     potassium chloride SA (K-DUR) 20 MEQ tablet Take 2 tablets (40 mEq total) by mouth daily. 4 tablet 0   sildenafil (REVATIO) 20 MG tablet Take 3-5 tablets daily prn     tamsulosin (FLOMAX) 0.4 MG CAPS capsule      No facility-administered medications prior to visit.    No Known Allergies  Review of Systems  Constitutional:  Negative for chills and fever.  Respiratory:  Negative for shortness of breath.   Cardiovascular:  Negative for chest pain.  Gastrointestinal:  Positive for abdominal pain. Negative for blood in stool, constipation, diarrhea, nausea and vomiting.  Genitourinary:  Positive for  frequency. Negative for dysuria, hematuria, penile discharge, penile pain, penile swelling, scrotal swelling and testicular pain.       Feeling of incomplete emptying.  Some nocturia because of fluid intake      Objective:    Physical Exam Vitals and nursing note reviewed.  Constitutional:      Appearance: Normal appearance.  Cardiovascular:     Rate and Rhythm: Normal rate and regular rhythm.  Pulmonary:     Effort: Pulmonary effort is normal.     Breath sounds: Normal breath sounds.  Abdominal:     General: Bowel sounds are normal. There is no distension.     Palpations: There is no mass.     Tenderness: There is no abdominal tenderness. There is no right CVA tenderness or left CVA tenderness.  Neurological:     Mental Status: He is alert.  Psychiatric:        Mood and Affect: Mood  normal.        Thought Content: Thought content normal.        Judgment: Judgment normal.    BP 124/72   Pulse 88   Temp 98.4 F (36.9 C)   Resp 14   Ht 5\' 10"  (1.778 m)   Wt 183 lb (83 kg)   SpO2 97%   BMI 26.26 kg/m  Wt Readings from Last 3 Encounters:  07/04/21 183 lb (83 kg)  03/08/21 180 lb 6.4 oz (81.8 kg)  12/22/20 183 lb (83 kg)    Health Maintenance Due  Topic Date Due   COVID-19 Vaccine (3 - Booster for Moderna series) 08/13/2020   COLONOSCOPY (Pts 45-68yrs Insurance coverage will need to be confirmed)  02/08/2021   INFLUENZA VACCINE  03/07/2021    There are no preventive care reminders to display for this patient.   Lab Results  Component Value Date   TSH 1.71 03/08/2021   Lab Results  Component Value Date   WBC 5.2 03/08/2021   HGB 13.7 03/08/2021   HCT 41.9 03/08/2021   MCV 95.1 03/08/2021   PLT 230.0 03/08/2021   Lab Results  Component Value Date   NA 138 03/08/2021   K 4.3 03/08/2021   CO2 27 03/08/2021   GLUCOSE 90 03/08/2021   BUN 14 03/08/2021   CREATININE 0.68 03/08/2021   BILITOT 0.8 03/08/2021   ALKPHOS 87 03/08/2021   AST 20 03/08/2021   ALT 25 03/08/2021   PROT 7.6 03/08/2021   ALBUMIN 4.3 03/08/2021   CALCIUM 9.3 03/08/2021   GFR 95.70 03/08/2021   Lab Results  Component Value Date   CHOL 196 03/08/2021   Lab Results  Component Value Date   HDL 48.30 03/08/2021   Lab Results  Component Value Date   LDLCALC 118 (H) 03/08/2021   Lab Results  Component Value Date   TRIG 148.0 03/08/2021   Lab Results  Component Value Date   CHOLHDL 4 03/08/2021   No results found for: HGBA1C     Assessment & Plan:   Problem List Items Addressed This Visit       Genitourinary   Acute cystitis with hematuria    Alysis indicative of repeat urinary tract infection.  Patient with strong history of same last we have documented was May.  Did review cultures was resistant to Bactrim we will place on Macrobid as it was resistant and  patient did well with medication last time.  Continue to monitor.  Signs and symptoms reviewed as when to seek urgent or emergent  health care patient acknowledged. Start Macrobid 100 mg twice daily for 7 days.      Relevant Medications   nitrofurantoin, macrocrystal-monohydrate, (MACROBID) 100 MG capsule   Other Relevant Orders   Urinalysis, microscopic only   Urine Culture     Other   Urinary frequency - Primary    Been having urinary frequency history of urinary tract infections.      Relevant Orders   POCT URINALYSIS DIP (CLINITEK) (Completed)   Generalized abdominal pain    Specific abdominal pain.  Patient did endorse having this when he has urinary tract infections.  Exam benign right short course of ibuprofen as it seems to help patient.  Did give precautions of avoiding other NSAIDs with medication.  Continue to monitor.  Discussed signs and symptoms when to seek urgent or emergent health care.      Relevant Medications   ibuprofen (ADVIL) 800 MG tablet   Other Visit Diagnoses     Need for influenza vaccination       Relevant Orders   Flu Vaccine QUAD High Dose(Fluad) (Completed)        No orders of the defined types were placed in this encounter.  This visit occurred during the SARS-CoV-2 public health emergency.  Safety protocols were in place, including screening questions prior to the visit, additional usage of staff PPE, and extensive cleaning of exam room while observing appropriate contact time as indicated for disinfecting solutions.    Romilda Garret, NP

## 2021-07-04 NOTE — Patient Instructions (Signed)
Nice to see you today Do NOT take any other NSAID with the ibuprofen. Avoid Motrin, Aleve, naproxen or any other NSAIDs over the counter. Follow up if symptoms fail to improve or get worse

## 2021-07-04 NOTE — Assessment & Plan Note (Signed)
Alysis indicative of repeat urinary tract infection.  Patient with strong history of same last we have documented was May.  Did review cultures was resistant to Bactrim we will place on Macrobid as it was resistant and patient did well with medication last time.  Continue to monitor.  Signs and symptoms reviewed as when to seek urgent or emergent health care patient acknowledged. Start Macrobid 100 mg twice daily for 7 days.

## 2021-07-04 NOTE — Assessment & Plan Note (Signed)
Been having urinary frequency history of urinary tract infections.

## 2021-07-05 NOTE — Telephone Encounter (Signed)
Spoke with patient. Patient took Nitrofurantoin 2 tablets yesterday and 1 today. Feels like his symptoms are a little worse, but not vomiting or passing blood-just feels yucky. I advised patient unfortunately we do not have urine culture in yet and it may not be in until tomorrow. Explained that we needed to wait and see what that shows so we can treat him appropriately. Patient verbalized understanding and can wait to hear back. I did advise that if we get results in today we will try and call him back with that information.

## 2021-07-05 NOTE — Telephone Encounter (Signed)
Can we call and get more information. The medication I prescribed was the same on that was done in the past. Also I do not have his urine culture back as of yet  Thanks

## 2021-07-07 ENCOUNTER — Other Ambulatory Visit: Payer: Self-pay | Admitting: Nurse Practitioner

## 2021-07-07 ENCOUNTER — Encounter: Payer: Self-pay | Admitting: Nurse Practitioner

## 2021-07-07 ENCOUNTER — Telehealth: Payer: Self-pay | Admitting: Nurse Practitioner

## 2021-07-07 DIAGNOSIS — N3001 Acute cystitis with hematuria: Secondary | ICD-10-CM

## 2021-07-07 LAB — URINE CULTURE
MICRO NUMBER:: 12683936
SPECIMEN QUALITY:: ADEQUATE

## 2021-07-07 MED ORDER — CIPROFLOXACIN HCL 500 MG PO TABS: 500.0000 mg | ORAL_TABLET | Freq: Two times a day (BID) | ORAL | 0 refills | Status: AC

## 2021-07-07 NOTE — Telephone Encounter (Signed)
See result notes and phone note. Romilda Garret spoke with patient about his results

## 2021-07-07 NOTE — Telephone Encounter (Signed)
Cameron Rice called in due to he saw [facility] on Monday for UTI and was prescribed medication and the medication is not working and wanted to know if he can get something else for the UTI.   Please advise  [address]

## 2021-07-07 NOTE — Telephone Encounter (Signed)
See lab result note. Calling patient

## 2021-07-07 NOTE — Telephone Encounter (Signed)
Urine culture report is not back fully still. I have sent [name] in lab a note to follow up with [facility] on this. Cameron Rice is aware of this. Will be in touch with patient once we have more information

## 2021-07-07 NOTE — Progress Notes (Signed)
Called and talked with patient. Stop Macrobid and start Cipro

## 2021-07-18 ENCOUNTER — Other Ambulatory Visit: Payer: Self-pay | Admitting: Nurse Practitioner

## 2021-07-18 DIAGNOSIS — I10 Essential (primary) hypertension: Secondary | ICD-10-CM

## 2021-07-21 ENCOUNTER — Telehealth: Payer: Self-pay | Admitting: Nurse Practitioner

## 2021-07-21 ENCOUNTER — Encounter: Payer: Self-pay | Admitting: Nurse Practitioner

## 2021-07-21 DIAGNOSIS — I10 Essential (primary) hypertension: Secondary | ICD-10-CM

## 2021-07-21 NOTE — Telephone Encounter (Signed)
Chart supports Rx Last seen 03/2021 Next OV 09/2021

## 2021-07-25 MED ORDER — AMLODIPINE BESY-BENAZEPRIL HCL 10-20 MG PO CAPS: 1.0000 | ORAL_CAPSULE | Freq: Every day | ORAL | 0 refills | Status: DC

## 2021-07-25 NOTE — Addendum Note (Signed)
Addended by: Renette Butters on: 07/25/2021 08:46 AM   Modules accepted: Orders

## 2021-07-25 NOTE — Telephone Encounter (Signed)
10 pills sent to [facility] while waiting for mail order Rx. LVM notifying the patient.

## 2021-07-29 ENCOUNTER — Telehealth: Payer: Self-pay | Admitting: Nurse Practitioner

## 2021-07-29 NOTE — Telephone Encounter (Signed)
Pt was seen on 07/04/21 for a uti and was prescribed nitrofurantoin, macrocrystal-monohydrate, (MACROBID) 100 MG capsule [[id number]]  DISCONTINUED by Charmian Muff. He feels his uti has come back, Cameron Rice's cma informed him he would need to be seen to get more of this script. I spoke with pt and informed him we had no available appointments for today 07/29/21 and he should go to an urgent care. He still wanted me to put a request in for this med.

## 2021-08-04 NOTE — Telephone Encounter (Signed)
As stated by the previous provider patient will need to be seen for additional refills or go to urgent care.

## 2021-08-04 NOTE — Telephone Encounter (Signed)
I called pt and advised him to call us back to schedule an appointment to get a refill for this script or go to an urgent care.

## 2021-09-08 ENCOUNTER — Ambulatory Visit (INDEPENDENT_AMBULATORY_CARE_PROVIDER_SITE_OTHER): Payer: BC Managed Care – PPO | Admitting: Nurse Practitioner

## 2021-09-08 ENCOUNTER — Encounter: Payer: Self-pay | Admitting: Nurse Practitioner

## 2021-09-08 ENCOUNTER — Other Ambulatory Visit: Payer: Self-pay

## 2021-09-08 VITALS — BP 128/82 | HR 82 | Temp 97.1°F | Ht 70.0 in | Wt 182.0 lb

## 2021-09-08 DIAGNOSIS — M5441 Lumbago with sciatica, right side: Secondary | ICD-10-CM

## 2021-09-08 DIAGNOSIS — I10 Essential (primary) hypertension: Secondary | ICD-10-CM

## 2021-09-08 DIAGNOSIS — E7800 Pure hypercholesterolemia, unspecified: Secondary | ICD-10-CM

## 2021-09-08 DIAGNOSIS — G8929 Other chronic pain: Secondary | ICD-10-CM | POA: Diagnosis not present

## 2021-09-08 LAB — BASIC METABOLIC PANEL
BUN: 17 mg/dL (ref 6–23)
CO2: 29 mEq/L (ref 19–32)
Calcium: 9.7 mg/dL (ref 8.4–10.5)
Chloride: 102 mEq/L (ref 96–112)
Creatinine, Ser: 0.82 mg/dL (ref 0.40–1.50)
GFR: 90.12 mL/min (ref >60.00)
Glucose, Bld: 85 mg/dL (ref 70–99)
Potassium: 4.2 mEq/L (ref 3.5–5.1)
Sodium: 136 mEq/L (ref 135–145)

## 2021-09-08 LAB — LIPID PANEL
Cholesterol: 179 mg/dL (ref 0–200)
HDL: 52.7 mg/dL (ref >39.00)
LDL Cholesterol: 101 mg/dL — ABNORMAL HIGH (ref 0–99)
NonHDL: 125.96
Total CHOL/HDL Ratio: 3
Triglycerides: 124 mg/dL (ref 0.0–149.0)
VLDL: 24.8 mg/dL (ref 0.0–40.0)

## 2021-09-08 MED ORDER — ATORVASTATIN CALCIUM 40 MG PO TABS: ORAL_TABLET | ORAL | 3 refills | Status: AC

## 2021-09-08 MED ORDER — AMLODIPINE BESY-BENAZEPRIL HCL 10-20 MG PO CAPS: 1.0000 | ORAL_CAPSULE | Freq: Every day | ORAL | 3 refills | Status: AC

## 2021-09-08 NOTE — Progress Notes (Signed)
Subjective:  Patient ID: Cameron Rice, male    DOB: May 11, 1951  Age: 71 y.o. MRN: [id number]  CC: Follow-up (6 month f/u on HTN and hyperlipidemia. /Pt is fasting. )  HPI  Pure hypercholesterolemia Repeat lipid panel Maintain atorvastatin dose  Essential hypertension BP at goal BP Readings from Last 3 Encounters:  09/08/21 128/82  07/04/21 124/72  03/08/21 110/70   Repeat BMP Maintain lotrel dose  Chronic right-sided low back pain Minimal improvement with PT x 8weeks, home exercises and use of NSAIDs. Persistent proximal leg pain  With prolonged sittingand in supine position. Improves with walking and stretching. No saddle paresthesia, no change in GI/GU function.  DG lumbar spine Consider referral to ortho-spine  Wt Readings from Last 3 Encounters:  09/08/21 182 lb (82.6 kg)  07/04/21 183 lb (83 kg)  03/08/21 180 lb 6.4 oz (81.8 kg)    Reviewed past Medical, Social and Family history today.  Outpatient Medications Prior to Visit  Medication Sig Dispense Refill   Multiple Vitamins-Minerals (ONE-A-DAY MENS 50+ ADVANTAGE) TABS      oxybutynin (DITROPAN-XL) 5 MG 24 hr tablet TAKE 1 TABLET DAILY     sildenafil (REVATIO) 20 MG tablet Take 3-5 tablets daily prn     tamsulosin (FLOMAX) 0.4 MG CAPS capsule      amLODipine-benazepril (LOTREL) 10-20 MG capsule Take 1 capsule by mouth daily. 10 capsule 0   atorvastatin (LIPITOR) 40 MG tablet TAKE 1 TABLET DAILY AT 6PM (NO ADDITIONAL REFILL WITHOUT OFFICE VISIT) 90 tablet 3   No facility-administered medications prior to visit.   ROS See HPI  Objective:  BP 128/82 (BP Location: Left Arm, Patient Position: Sitting, Cuff Size: Large)    Pulse 82    Temp (!) 97.1 F (36.2 C) (Temporal)    Ht 5\' 10"  (1.778 m)    Wt 182 lb (82.6 kg)    SpO2 97%    BMI 26.11 kg/m   Physical Exam Cardiovascular:     Rate and Rhythm: Normal rate and regular rhythm.     Pulses: Normal pulses.     Heart sounds: Normal heart sounds.   Pulmonary:     Effort: Pulmonary effort is normal.  Musculoskeletal:     Right lower leg: No edema.     Left lower leg: No edema.  Neurological:     Mental Status: He is alert.   Assessment & Plan:  This visit occurred during the SARS-CoV-2 public health emergency.  Safety protocols were in place, including screening questions prior to the visit, additional usage of staff PPE, and extensive cleaning of exam room while observing appropriate contact time as indicated for disinfecting solutions.   Cameron Rice was seen today for follow-up.  Diagnoses and all orders for this visit:  Essential hypertension -     Basic metabolic panel -     amLODipine-benazepril (LOTREL) 10-20 MG capsule; Take 1 capsule by mouth daily.  Pure hypercholesterolemia -     Lipid panel -     atorvastatin (LIPITOR) 40 MG tablet; TAKE 1 TABLET DAILY AT 6PM  Chronic right-sided low back pain with right-sided sciatica -     DG Lumbar Spine Complete   Problem List Items Addressed This Visit       Cardiovascular and Mediastinum   Essential hypertension - Primary    BP at goal BP Readings from Last 3 Encounters:  09/08/21 128/82  07/04/21 124/72  03/08/21 110/70   Repeat BMP Maintain lotrel dose  Relevant Medications   amLODipine-benazepril (LOTREL) 10-20 MG capsule   atorvastatin (LIPITOR) 40 MG tablet   Other Relevant Orders   Basic metabolic panel (Completed)     Other   Chronic right-sided low back pain    Minimal improvement with PT x 8weeks, home exercises and use of NSAIDs. Persistent proximal leg pain  With prolonged sittingand in supine position. Improves with walking and stretching. No saddle paresthesia, no change in GI/GU function.  DG lumbar spine Consider referral to ortho-spine      Relevant Orders   DG Lumbar Spine Complete   Pure hypercholesterolemia    Repeat lipid panel Maintain atorvastatin dose      Relevant Medications   amLODipine-benazepril (LOTREL) 10-20 MG  capsule   atorvastatin (LIPITOR) 40 MG tablet   Other Relevant Orders   Lipid panel (Completed)    Follow-up: Return in about 6 months (around 03/08/2022) for HTn and , hyperlipidemia.  Wilfred Lacy, NP

## 2021-09-08 NOTE — Assessment & Plan Note (Addendum)
Minimal improvement with PT x 8weeks, home exercises and use of NSAIDs. Persistent proximal leg pain, limited ROM, and decrease ability to run.  With prolonged sitting and in supine position. Improves with walking and stretching. No saddle paresthesia, no change in GI/GU function.  DG lumbar spine Consider referral to ortho-spine

## 2021-09-08 NOTE — Patient Instructions (Signed)
Go to lab for blood draw and x-ray  Send copy of colonoscopy report via mychart.

## 2021-09-08 NOTE — Assessment & Plan Note (Signed)
Repeat lipid panel Maintain atorvastatin dose 

## 2021-09-08 NOTE — Assessment & Plan Note (Signed)
BP at goal BP Readings from Last 3 Encounters:  09/08/21 128/82  07/04/21 124/72  03/08/21 110/70   Repeat BMP Maintain lotrel dose

## 2021-09-29 ENCOUNTER — Encounter: Payer: Self-pay | Admitting: Nurse Practitioner

## 2021-09-29 DIAGNOSIS — M5137 Other intervertebral disc degeneration, lumbosacral region: Secondary | ICD-10-CM

## 2021-09-29 DIAGNOSIS — G8929 Other chronic pain: Secondary | ICD-10-CM

## 2021-10-10 ENCOUNTER — Other Ambulatory Visit: Payer: BC Managed Care – PPO

## 2021-10-10 ENCOUNTER — Ambulatory Visit (INDEPENDENT_AMBULATORY_CARE_PROVIDER_SITE_OTHER): Payer: BC Managed Care – PPO

## 2021-10-10 ENCOUNTER — Other Ambulatory Visit: Payer: Self-pay

## 2021-10-10 DIAGNOSIS — M5137 Other intervertebral disc degeneration, lumbosacral region: Secondary | ICD-10-CM

## 2021-10-10 DIAGNOSIS — M5416 Radiculopathy, lumbar region: Secondary | ICD-10-CM

## 2021-10-10 MED ORDER — CYCLOBENZAPRINE HCL 5 MG PO TABS: 5.0000 mg | ORAL_TABLET | Freq: Every day | ORAL | 0 refills | Status: DC

## 2021-10-10 MED ORDER — PREDNISONE 20 MG PO TABS: ORAL_TABLET | ORAL | 0 refills | Status: AC

## 2021-10-10 NOTE — Addendum Note (Signed)
Addended by: Leana Gamer on: 10/10/2021 03:39 PM ? ? Modules accepted: Orders ? ?

## 2021-10-28 ENCOUNTER — Other Ambulatory Visit: Payer: Self-pay

## 2021-10-28 ENCOUNTER — Encounter: Payer: Self-pay | Admitting: Physician Assistant

## 2021-10-28 ENCOUNTER — Ambulatory Visit (INDEPENDENT_AMBULATORY_CARE_PROVIDER_SITE_OTHER): Payer: BC Managed Care – PPO | Admitting: Physician Assistant

## 2021-10-28 VITALS — Ht 70.0 in | Wt 182.0 lb

## 2021-10-28 DIAGNOSIS — G8929 Other chronic pain: Secondary | ICD-10-CM | POA: Diagnosis not present

## 2021-10-28 DIAGNOSIS — M545 Low back pain, unspecified: Secondary | ICD-10-CM | POA: Diagnosis not present

## 2021-10-28 MED ORDER — METHYLPREDNISOLONE 4 MG PO TBPK: ORAL_TABLET | ORAL | 0 refills | Status: DC

## 2021-10-28 NOTE — Progress Notes (Signed)
? ?Office Visit Note ?  ?Patient: Cameron Rice           ?Date of Birth: January 16, 1951           ?MRN: [id number] ?Visit Date: 10/28/2021 ?             ?Requested by: Flossie Buffy, NP ?[address] ?[place],  [place] [address] ?PCP: Flossie Buffy, NP ? ?Chief Complaint  ?Patient presents with  ? Lower Back - Pain  ? ? ? ? ?HPI: ?Patient is a pleasant 71 year old healthy gentleman who has a 3-year history of lower back pain.  He denies any specific injury but is always been active.  He at one point was a runner but has now walking.  He is still working.  He denies any loss of bowel or bladder control.  He has pain and tingling going down the back of his legs right greater than left. ? ?Assessment & Plan: ?Visit Diagnoses:  ?1. Chronic bilateral low back pain, unspecified whether sciatica present   ? ? ?Plan: Fit healthy gentleman with 3-year history of lower back pain with radicular symptoms.  He has gone through courses of physical therapy and a steroid taper which helped temporarily then all his symptoms returned.  X-rays show mild to moderate spondylosis of the lumbar spine with disc disease at the L4-5 L5-S1 level.  Given the timeframe that this is been going on in his failure of conservative care including medication and physical therapy we will go forward with an MRI.  Follow-up once the MRI is complete ? ?Follow-Up Instructions: No follow-ups on file.  ? ?Ortho Exam ? ?Patient is alert, oriented, no adenopathy, well-dressed, normal affect, normal respiratory effort. ?Examination fit healthy gentleman.  He has stiffness with forward flexion and extension but not much pain he is okay with side to side bending.  He traces his pain that shoots down his buttock on the right greater than left side to the back of his knee does not go beyond that.  Note stiffness or pain in the groin with manipulation of the hip.  He has 5 out of 5 strength with dorsiflexion plantarflexion of his ankle sensation is  intact good strength with extension and flexion of his legs and flexion of his hips. ? ?Imaging: ?No results found. ?No images are attached to the encounter. ? ?Labs: ?No results found for: HGBA1C, ESRSEDRATE, CRP, LABURIC, REPTSTATUS, GRAMSTAIN, CULT, LABORGA ? ? ?Lab Results  ?Component Value Date  ? ALBUMIN 4.3 03/08/2021  ? ALBUMIN 4.4 12/09/2019  ? ALBUMIN 3.9 12/10/2018  ? ? ?No results found for: MG ?No results found for: VD25OH ? ?No results found for: PREALBUMIN ? ?  Latest Ref Rng & Units 03/08/2021  ? 10:26 AM 12/10/2018  ? 11:03 AM 12/28/2017  ?  8:48 AM  ?CBC EXTENDED  ?WBC 4.0 - 10.5 K/uL 5.2   8.5   9.3    ?RBC 4.22 - 5.81 Mil/uL 4.40   4.46   4.35    ?Hemoglobin 13.0 - 17.0 g/dL 13.7   14.0   13.8    ?HCT 39.0 - 52.0 % 41.9   41.7   40.6    ?Platelets 150.0 - 400.0 K/uL 230.0   246.0   291.0    ?NEUT# 1.4 - 7.7 K/uL  7.2     ?Lymph# 0.7 - 4.0 K/uL  0.9     ? ? ? ?Body mass index is 26.11 kg/m?. ? ?Orders:  ?Orders Placed  This Encounter  ?Procedures  ? MR Lumbar Spine w/o contrast  ? ?No orders of the defined types were placed in this encounter. ? ? ? Procedures: ?No procedures performed ? ?Clinical Data: ?No additional findings. ? ?ROS: ? ?All other systems negative, except as noted in the HPI. ?Review of Systems ? ?Objective: ?Vital Signs: Ht '5\' 10"'$  (1.778 m)   Wt 182 lb (82.6 kg)   BMI 26.11 kg/m?  ? ?Specialty Comments:  ?No specialty comments available. ? ?PMFS History: ?Patient Active Problem List  ? Diagnosis Date Noted  ? Urinary frequency 07/04/2021  ? Generalized abdominal pain 07/04/2021  ? Chronic right-sided low back pain 12/22/2020  ? Hx of colonic polyp 12/10/2018  ? AC joint arthropathy 11/26/2018  ? Need for prophylactic vaccination against Streptococcus pneumoniae (pneumococcus) and influenza 06/05/2018  ? Complicated UTI (urinary tract infection) 08/29/2017  ? Erectile disorder due to medical condition in male 08/29/2017  ? Shift work sleep disorder 09/04/2013  ? Essential hypertension  09/06/2011  ? BPH (benign prostatic hyperplasia) 09/06/2011  ? Pure hypercholesterolemia 09/06/2011  ? Hypogonadism male 09/06/2011  ? ?Past Medical History:  ?Diagnosis Date  ? Hyperlipidemia   ? Hypertension   ? UTI (urinary tract infection)   ?  ?Family History  ?Problem Relation Age of Onset  ? Cancer Mother   ? Cancer Brother   ?     liver cancer  ? Cancer Maternal Aunt   ?     lung  ?  ?Past Surgical History:  ?Procedure Laterality Date  ? URETHRA SURGERY    ? went in clean make sure urethra is clear  ? ?Social History  ? ?Occupational History  ? Not on file  ?Tobacco Use  ? Smoking status: Never  ? Smokeless tobacco: Never  ?Vaping Use  ? Vaping Use: Never used  ?Substance and Sexual Activity  ? Alcohol use: No  ? Drug use: No  ? Sexual activity: Yes  ? ? ? ? ? ?

## 2021-11-09 ENCOUNTER — Ambulatory Visit
Admission: RE | Admit: 2021-11-09 | Discharge: 2021-11-09 | Disposition: A | Discharge status: Home / Self Care | Payer: BC Managed Care – PPO | Source: Ambulatory Visit | Attending: Physician Assistant | Admitting: Physician Assistant

## 2021-11-09 DIAGNOSIS — M545 Low back pain, unspecified: Secondary | ICD-10-CM

## 2021-11-12 NOTE — Progress Notes (Signed)
Best to see in office-please be sure we see him this coming week

## 2021-11-17 ENCOUNTER — Ambulatory Visit (INDEPENDENT_AMBULATORY_CARE_PROVIDER_SITE_OTHER): Payer: BC Managed Care – PPO | Admitting: Orthopaedic Surgery

## 2021-11-17 ENCOUNTER — Encounter: Payer: Self-pay | Admitting: Orthopaedic Surgery

## 2021-11-17 DIAGNOSIS — M5441 Lumbago with sciatica, right side: Secondary | ICD-10-CM | POA: Diagnosis not present

## 2021-11-17 DIAGNOSIS — G8929 Other chronic pain: Secondary | ICD-10-CM | POA: Diagnosis not present

## 2021-11-17 NOTE — Progress Notes (Signed)
? ?Office Visit Note ?  ?Patient: Cameron Rice           ?Date of Birth: [birth year]           ?MRN: [id number] ?Visit Date: 11/17/2021 ?             ?Requested by: Flossie Buffy, NP ?[address] ?[place],  [place] [address] ?PCP: Flossie Buffy, NP ? ? ?Assessment & Plan: ?Visit Diagnoses:  ?1. Chronic right-sided low back pain with right-sided sciatica   ? ? ?Plan: Rice has a long history of low back pain and bilateral lower extremity pain.  He has been through a full course of physical therapy.  He had some degenerative changes noted on plain films of his lumbar spine but because of his persistent pain we obtained an MRI scan.  The the scan demonstrated congenitally short pedicles with superimposed disc and facet degeneration resulting in severe spinal stenosis at L4-5 and moderate to severe spinal stenosis at L3-4 and L5-S1.  There was severe bilateral neuroforaminal stenosis at L5-S1 and moderate neuroforaminal stenosis at L3-4 and L4-5.  Long discussion regarding all of the above.  He notes the pain does come and go.  He likes to remain physically active.  I think it is worth trying an epidural steroid injection.  We will have Dr. Ernestina Patches call him set it up at his convenience and then like to check him back in a month.  He is already been through a course of physical therapy and I have encouraged him to continue with the exercises.  Neurologically appears to be intact ? ?Follow-Up Instructions: Return in about 1 month (around 12/17/2021).  ? ?Orders:  ?No orders of the defined types were placed in this encounter. ? ?No orders of the defined types were placed in this encounter. ? ? ? ? Procedures: ?No procedures performed ? ? ?Clinical Data: ?No additional findings. ? ? ?Subjective: ?Chief Complaint  ?Patient presents with  ? Lower Back - Follow-up  ?  MRI review  ?Patient presents today for follow up on his lower back. He had and MRI and is here today for those results. ? ?HPI ? ?Review of  Systems ? ? ?Objective: ?Vital Signs: There were no vitals taken for this visit. ? ?Physical Exam ?Constitutional:   ?   Appearance: He is well-developed.  ?Eyes:  ?   Pupils: Pupils are equal, round, and reactive to light.  ?Pulmonary:  ?   Effort: Pulmonary effort is normal.  ?Skin: ?   General: Skin is warm and dry.  ?Neurological:  ?   Mental Status: He is alert and oriented to person, place, and time.  ?Psychiatric:     ?   Behavior: Behavior normal.  ? ? ?Ortho Exam awake alert and oriented x3.  Comfortable sitting.  No acute distress.  No percussible tenderness lumbar spine straight leg raise is negative.  Motor exam intact.  Walks without a limp.  Painless range of motion both hips and both knees. ? ?Specialty Comments:  ?No specialty comments available. ? ?Imaging: ?No results found. ? ? ?PMFS History: ?Patient Active Problem List  ? Diagnosis Date Noted  ? Urinary frequency 07/04/2021  ? Generalized abdominal pain 07/04/2021  ? Chronic right-sided low back pain 12/22/2020  ? Hx of colonic polyp 12/10/2018  ? AC joint arthropathy 11/26/2018  ? Need for prophylactic vaccination against Streptococcus pneumoniae (pneumococcus) and influenza 06/05/2018  ? Complicated UTI (urinary tract infection) 08/29/2017  ? Erectile  disorder due to medical condition in male 08/29/2017  ? Shift work sleep disorder 09/04/2013  ? Essential hypertension 09/06/2011  ? BPH (benign prostatic hyperplasia) 09/06/2011  ? Pure hypercholesterolemia 09/06/2011  ? Hypogonadism male 09/06/2011  ? ?Past Medical History:  ?Diagnosis Date  ? Hyperlipidemia   ? Hypertension   ? UTI (urinary tract infection)   ?  ?Family History  ?Problem Relation Age of Onset  ? Cancer Mother   ? Cancer Brother   ?     liver cancer  ? Cancer Maternal Aunt   ?     lung  ?  ?Past Surgical History:  ?Procedure Laterality Date  ? URETHRA SURGERY    ? went in clean make sure urethra is clear  ? ?Social History  ? ?Occupational History  ? Not on file  ?Tobacco Use   ? Smoking status: Never  ? Smokeless tobacco: Never  ?Vaping Use  ? Vaping Use: Never used  ?Substance and Sexual Activity  ? Alcohol use: No  ? Drug use: No  ? Sexual activity: Yes  ? ? ? ? ? ? ?

## 2021-11-21 NOTE — Progress Notes (Signed)
Tried to call patient to schedule. No answer. LMOM to call to schedule appointment.  ?

## 2021-11-22 ENCOUNTER — Telehealth: Payer: Self-pay | Admitting: Orthopaedic Surgery

## 2021-11-22 ENCOUNTER — Other Ambulatory Visit: Payer: Self-pay

## 2021-11-22 DIAGNOSIS — G8929 Other chronic pain: Secondary | ICD-10-CM

## 2021-11-22 NOTE — Telephone Encounter (Signed)
Order has been placed for ESI with Dr.Newton. ?

## 2021-11-22 NOTE — Telephone Encounter (Signed)
Pt states he returned call to [place] states he already went over his results for MRI. Waiting for call from referred from Dr. Durward Fortes for injection. Pt phone number is [phone] ?

## 2021-12-07 ENCOUNTER — Telehealth: Payer: Self-pay | Admitting: Physical Medicine and Rehabilitation

## 2021-12-07 NOTE — Telephone Encounter (Signed)
Patient called needing to R/S his appointment. The number to contact patient is [phone] ?

## 2021-12-29 ENCOUNTER — Encounter: Payer: Self-pay | Admitting: Physical Medicine and Rehabilitation

## 2021-12-29 ENCOUNTER — Ambulatory Visit: Payer: Self-pay

## 2021-12-29 ENCOUNTER — Ambulatory Visit (INDEPENDENT_AMBULATORY_CARE_PROVIDER_SITE_OTHER): Payer: BC Managed Care – PPO | Admitting: Physical Medicine and Rehabilitation

## 2021-12-29 VITALS — BP 144/85 | HR 77

## 2021-12-29 DIAGNOSIS — M5416 Radiculopathy, lumbar region: Secondary | ICD-10-CM | POA: Diagnosis not present

## 2021-12-29 MED ORDER — METHYLPREDNISOLONE ACETATE 80 MG/ML IJ SUSP
80.0000 mg | Freq: Once | INTRAMUSCULAR | Status: AC
  Administered 2021-12-29: 80 mg

## 2021-12-29 NOTE — Progress Notes (Signed)
Pt state lower back pain that travels down both legs. Pt state walking and stnaidng makes the pain worse. Pt state he takes over the counter pain meds to help ease his pain.  Numeric Pain Rating Scale and Functional Assessment Average Pain 7   In the last MONTH (on 0-10 scale) has pain interfered with the following?  1. General activity like being  able to carry out your everyday physical activities such as walking, climbing stairs, carrying groceries, or moving a chair?  Rating(9)   +Driver, -BT, -Dye Allergies.

## 2021-12-29 NOTE — Patient Instructions (Signed)

## 2022-01-04 NOTE — Procedures (Signed)
Lumbosacral Transforaminal Epidural Steroid Injection - Sub-Pedicular Approach with Fluoroscopic Guidance  Patient: Cameron Rice      Date of Birth: 03-Oct-1950 MRN: [id number] PCP: Flossie Buffy, NP      Visit Date: 12/29/2021   Universal Protocol:    Date/Time: 12/29/2021  Consent Given By: the patient  Position: PRONE  Additional Comments: Vital signs were monitored before and after the procedure. Patient was prepped and draped in the usual sterile fashion. The correct patient, procedure, and site was verified.   Injection Procedure Details:   Procedure diagnoses: Lumbar radiculopathy [M54.16]    Meds Administered:  Meds ordered this encounter  Medications   methylPREDNISolone acetate (DEPO-MEDROL) injection 80 mg    Laterality: Bilateral  Location/Site: L4  Needle:5.0 in., 22 ga.  Short bevel or Quincke spinal needle  Needle Placement: Transforaminal  Findings:    -Comments: Excellent flow of contrast along the nerve, nerve root and into the epidural space.  Procedure Details: After squaring off the end-plates to get a true AP view, the C-arm was positioned so that an oblique view of the foramen as noted above was visualized. The target area is just inferior to the "nose of the scotty dog" or sub pedicular. The soft tissues overlying this structure were infiltrated with 2-3 ml. of 1% Lidocaine without Epinephrine.  The spinal needle was inserted toward the target using a "trajectory" view along the fluoroscope beam.  Under AP and lateral visualization, the needle was advanced so it did not puncture dura and was located close the 6 O'Clock position of the pedical in AP tracterory. Biplanar projections were used to confirm position. Aspiration was confirmed to be negative for CSF and/or blood. A 1-2 ml. volume of Isovue-250 was injected and flow of contrast was noted at each level. Radiographs were obtained for documentation purposes.   After attaining the  desired flow of contrast documented above, a 0.5 to 1.0 ml test dose of 0.25% Marcaine was injected into each respective transforaminal space.  The patient was observed for 90 seconds post injection.  After no sensory deficits were reported, and normal lower extremity motor function was noted,   the above injectate was administered so that equal amounts of the injectate were placed at each foramen (level) into the transforaminal epidural space.   Additional Comments:  The patient tolerated the procedure well Dressing: 2 x 2 sterile gauze and Band-Aid    Post-procedure details: Patient was observed during the procedure. Post-procedure instructions were reviewed.  Patient left the clinic in stable condition.

## 2022-01-04 NOTE — Progress Notes (Signed)
Cameron Rice - 71 y.o. male MRN [id number]  Date of birth: August 28, 1950  Office Visit Note: Visit Date: 12/29/2021 PCP: Flossie Buffy, NP Referred by: Garald Balding, MD  Subjective: Chief Complaint  Patient presents with   Lower Back - Pain   Right Leg - Pain   Left Leg - Pain   HPI:  Cameron Rice is a 71 y.o. male who comes in today at the request of Dr. Joni Fears for planned Bilateral L4-5 Lumbar Interlaminar epidural steroid injection with fluoroscopic guidance.  The patient has failed conservative care including home exercise, medications, time and activity modification.  This injection will be diagnostic and hopefully therapeutic.  Please see requesting physician notes for further details and justification. MRI reviewed with images and spine model.  MRI reviewed in the note below.   ROS Otherwise per HPI.  Assessment & Plan: Visit Diagnoses:    ICD-10-CM   1. Lumbar radiculopathy  M54.16 XR C-ARM NO REPORT    Epidural Steroid injection    methylPREDNISolone acetate (DEPO-MEDROL) injection 80 mg      Plan: No additional findings.   Meds & Orders:  Meds ordered this encounter  Medications   methylPREDNISolone acetate (DEPO-MEDROL) injection 80 mg    Orders Placed This Encounter  Procedures   XR C-ARM NO REPORT   Epidural Steroid injection    Follow-up: Return if symptoms worsen or fail to improve.   Procedures: No procedures performed  Lumbosacral Transforaminal Epidural Steroid Injection - Sub-Pedicular Approach with Fluoroscopic Guidance  Patient: Cameron Rice      Date of Birth: Oct 04, 1950 MRN: [id number] PCP: Flossie Buffy, NP      Visit Date: 12/29/2021   Universal Protocol:    Date/Time: 12/29/2021  Consent Given By: the patient  Position: PRONE  Additional Comments: Vital signs were monitored before and after the procedure. Patient was prepped and draped in the usual sterile fashion. The correct patient, procedure,  and site was verified.   Injection Procedure Details:   Procedure diagnoses: Lumbar radiculopathy [M54.16]    Meds Administered:  Meds ordered this encounter  Medications   methylPREDNISolone acetate (DEPO-MEDROL) injection 80 mg    Laterality: Bilateral  Location/Site: L4  Needle:5.0 in., 22 ga.  Short bevel or Quincke spinal needle  Needle Placement: Transforaminal  Findings:    -Comments: Excellent flow of contrast along the nerve, nerve root and into the epidural space.  Procedure Details: After squaring off the end-plates to get a true AP view, the C-arm was positioned so that an oblique view of the foramen as noted above was visualized. The target area is just inferior to the "nose of the scotty dog" or sub pedicular. The soft tissues overlying this structure were infiltrated with 2-3 ml. of 1% Lidocaine without Epinephrine.  The spinal needle was inserted toward the target using a "trajectory" view along the fluoroscope beam.  Under AP and lateral visualization, the needle was advanced so it did not puncture dura and was located close the 6 O'Clock position of the pedical in AP tracterory. Biplanar projections were used to confirm position. Aspiration was confirmed to be negative for CSF and/or blood. A 1-2 ml. volume of Isovue-250 was injected and flow of contrast was noted at each level. Radiographs were obtained for documentation purposes.   After attaining the desired flow of contrast documented above, a 0.5 to 1.0 ml test dose of 0.25% Marcaine was injected into each respective transforaminal space.  The patient was  observed for 90 seconds post injection.  After no sensory deficits were reported, and normal lower extremity motor function was noted,   the above injectate was administered so that equal amounts of the injectate were placed at each foramen (level) into the transforaminal epidural space.   Additional Comments:  The patient tolerated the procedure  well Dressing: 2 x 2 sterile gauze and Band-Aid    Post-procedure details: Patient was observed during the procedure. Post-procedure instructions were reviewed.  Patient left the clinic in stable condition.    Clinical History: MRI LUMBAR SPINE WITHOUT CONTRAST   TECHNIQUE: Multiplanar, multisequence MR imaging of the lumbar spine was performed. No intravenous contrast was administered.   COMPARISON:  Lumbar spine radiographs 10/10/2021   FINDINGS: Segmentation: Transitional lumbosacral anatomy with partial lumbarization of S1. Rudimentary S1-2 disc.   Alignment:  Normal.   Vertebrae: No fracture, suspicious marrow lesion, or significant marrow edema.   Conus medullaris and cauda equina: Conus extends to the L1 level and is normal in signal. The cauda equina nerve roots are redundant/tortuous in appearance related to high-grade spinal stenosis.   Paraspinal and other soft tissues: 1.4 cm T2 hyperintensity in the left kidney, likely a cyst and for which no follow-up imaging is recommended.   Disc levels:   Disc desiccation in the lower lumbar spine with moderate disc space narrowing at L4-5 and mild narrowing at L5-S1. Diffuse congenital narrowing of the lumbar spinal canal due to short pedicles.   T12-L1 and L1-2: Negative.   L2-3: Mild disc bulging and mild facet hypertrophy result in mild spinal stenosis, mild bilateral lateral recess stenosis, and borderline bilateral neural foraminal stenosis.   L3-4: Disc bulging and moderate facet and ligamentum flavum hypertrophy result in moderate to severe spinal stenosis and moderate bilateral neural foraminal stenosis. Potential bilateral L4 nerve root impingement.   L4-5: Disc bulging and severe facet and ligamentum flavum hypertrophy result in severe spinal stenosis and moderate bilateral neural foraminal stenosis. Potential impingement of multiple nerve roots bilaterally at this level, particularly the L5  roots.   L5-S1: Disc bulging and severe facet and ligamentum flavum hypertrophy result in moderate to severe spinal stenosis and severe bilateral neural foraminal stenosis with potential bilateral L5 and S1 nerve root impingement. Bilateral facet joint effusions.   IMPRESSION: 1. Congenitally short pedicles with superimposed disc and facet degeneration resulting in severe spinal stenosis at L4-5 and moderate to severe spinal stenosis at L3-4 and L5-S1. 2. Severe bilateral neural foraminal stenosis at L5-S1. 3. Moderate bilateral neural foraminal stenosis at L3-4 and L4-5.     Electronically Signed   By: Logan Bores M.D.   On: 11/09/2021 21:06     Objective:  VS:  HT:    WT:   BMI:     BP:(!) 144/85  HR:77bpm  TEMP: ( )  RESP:  Physical Exam Vitals and nursing note reviewed.  Constitutional:      General: He is not in acute distress.    Appearance: Normal appearance. He is not ill-appearing.  HENT:     Head: Normocephalic and atraumatic.     Right Ear: External ear normal.     Left Ear: External ear normal.     Nose: No congestion.  Eyes:     Extraocular Movements: Extraocular movements intact.  Cardiovascular:     Rate and Rhythm: Normal rate.     Pulses: Normal pulses.  Pulmonary:     Effort: Pulmonary effort is normal. No respiratory distress.  Abdominal:  General: There is no distension.     Palpations: Abdomen is soft.  Musculoskeletal:        General: No tenderness or signs of injury.     Cervical back: Neck supple.     Right lower leg: No edema.     Left lower leg: No edema.     Comments: Patient has good distal strength without clonus.  Skin:    Findings: No erythema or rash.  Neurological:     General: No focal deficit present.     Mental Status: He is alert and oriented to person, place, and time.     Sensory: No sensory deficit.     Motor: No weakness or abnormal muscle tone.     Coordination: Coordination normal.  Psychiatric:        Mood  and Affect: Mood normal.        Behavior: Behavior normal.     Imaging: No results found.

## 2022-02-24 LAB — PSA

## 2022-03-09 ENCOUNTER — Encounter: Payer: Self-pay | Admitting: Nurse Practitioner

## 2022-03-09 ENCOUNTER — Ambulatory Visit (INDEPENDENT_AMBULATORY_CARE_PROVIDER_SITE_OTHER): Payer: BC Managed Care – PPO | Admitting: Nurse Practitioner

## 2022-03-09 VITALS — BP 102/60 | HR 77 | Temp 97.3°F | Ht 70.0 in | Wt 180.4 lb

## 2022-03-09 DIAGNOSIS — Z0001 Encounter for general adult medical examination with abnormal findings: Secondary | ICD-10-CM | POA: Diagnosis not present

## 2022-03-09 DIAGNOSIS — I10 Essential (primary) hypertension: Secondary | ICD-10-CM | POA: Diagnosis not present

## 2022-03-09 DIAGNOSIS — Z86010 Personal history of colonic polyps: Secondary | ICD-10-CM | POA: Diagnosis not present

## 2022-03-09 DIAGNOSIS — E7800 Pure hypercholesterolemia, unspecified: Secondary | ICD-10-CM

## 2022-03-09 LAB — COMPREHENSIVE METABOLIC PANEL
ALT: 29 U/L (ref 0–53)
AST: 23 U/L (ref 0–37)
Albumin: 4.4 g/dL (ref 3.5–5.2)
Alkaline Phosphatase: 90 U/L (ref 39–117)
BUN: 19 mg/dL (ref 6–23)
CO2: 25 mEq/L (ref 19–32)
Calcium: 9.4 mg/dL (ref 8.4–10.5)
Chloride: 106 mEq/L (ref 96–112)
Creatinine, Ser: 0.67 mg/dL (ref 0.40–1.50)
GFR: 95.45 mL/min (ref >60.00)
Glucose, Bld: 92 mg/dL (ref 70–99)
Potassium: 3.7 mEq/L (ref 3.5–5.1)
Sodium: 139 mEq/L (ref 135–145)
Total Bilirubin: 0.9 mg/dL (ref 0.2–1.2)
Total Protein: 7.9 g/dL (ref 6.0–8.3)

## 2022-03-09 LAB — LIPID PANEL
Cholesterol: 147 mg/dL (ref 0–200)
HDL: 42.8 mg/dL (ref >39.00)
LDL Cholesterol: 82 mg/dL (ref 0–99)
NonHDL: 104.1
Total CHOL/HDL Ratio: 3
Triglycerides: 113 mg/dL (ref 0.0–149.0)
VLDL: 22.6 mg/dL (ref 0.0–40.0)

## 2022-03-09 NOTE — Assessment & Plan Note (Signed)
LDL at goal with atorvastatin Repeat lipid panel

## 2022-03-09 NOTE — Progress Notes (Signed)
Complete physical exam  Patient: Cameron Rice   DOB: 1951-06-24   71 y.o. Male  MRN: [id number] Visit Date: 03/09/2022  Subjective:    Chief Complaint  Patient presents with   Annual Exam    CPE Pt fasting  HTN /Hyperlipidemia f/u   Cameron Rice is a 71 y.o. male who presents today for a complete physical exam. He reports consuming a general diet. Home exercise routine includes calisthenics and stretching. He generally feels well. He reports sleeping well. He does not have additional problems to discuss today.  Vision:Yes Dental:Yes STD Screen:No PSA:Yes Exercise: running, walking, weight training  Wt Readings from Last 3 Encounters:  03/09/22 180 lb 6.4 oz (81.8 kg)  10/28/21 182 lb (82.6 kg)  09/08/21 182 lb (82.6 kg)    Most recent fall risk assessment:    09/08/2021    8:23 AM  [place] in the past year? 0  Number falls in past yr: 0  Injury with Fall? 0  Risk for fall due to : No Fall Risks  Follow up Falls evaluation completed     Most recent depression screenings:    09/08/2021    9:09 AM 07/22/2020    2:09 PM  PHQ 2/9 Scores  PHQ - 2 Score 0 0  PHQ- 9 Score 3     HPI  Essential hypertension BP at goal with amlodipine and benazepril BP Readings from Last 3 Encounters:  03/09/22 102/60  12/29/21 (!) 144/85  09/08/21 128/82   maintain med dose. Repeat CMP  Hx of colonic polyp No change in BM Last colonoscopy 04/2021 by [facility]: 1benign polyp Repeat in 7years  Pure hypercholesterolemia LDL at goal with atorvastatin Repeat lipid panel   Past Medical History:  Diagnosis Date   Hyperlipidemia    Hypertension    UTI (urinary tract infection)    Past Surgical History:  Procedure Laterality Date   URETHRA SURGERY     went in clean make sure urethra is clear   Social History   Socioeconomic History   Marital status: Married    Spouse name: Not on file   Number of children: Not on file   Years of education: Not on  file   Highest education level: Not on file  Occupational History   Not on file  Tobacco Use   Smoking status: Never   Smokeless tobacco: Never  Vaping Use   Vaping Use: Never used  Substance and Sexual Activity   Alcohol use: No   Drug use: No   Sexual activity: Yes  Other Topics Concern   Not on file  Social History Narrative   Not on file   Social Determinants of Health   Financial Resource Strain: Not on file  Food Insecurity: Not on file  Transportation Needs: Not on file  Physical Activity: Not on file  Stress: Not on file  Social Connections: Not on file  Intimate Partner Violence: Not on file   Family Status  Relation Name Status   Mother  (Not Specified)   Brother  (Not Specified)   Mat Aunt  (Not Specified)   Family History  Problem Relation Age of Onset   Cancer Mother    Cancer Brother        liver cancer   Cancer Maternal Aunt        lung   No Known Allergies  Patient Care Team: Mariam Helbert, Charlene Brooke, NP as PCP - General (Internal Medicine)  Medications: Outpatient Medications Prior to Visit  Medication Sig   amLODipine-benazepril (LOTREL) 10-20 MG capsule Take 1 capsule by mouth daily.   atorvastatin (LIPITOR) 40 MG tablet TAKE 1 TABLET DAILY AT 6PM   cyclobenzaprine (FLEXERIL) 5 MG tablet Take 1 tablet (5 mg total) by mouth at bedtime.   Multiple Vitamins-Minerals (ONE-A-DAY MENS 50+ ADVANTAGE) TABS    sildenafil (REVATIO) 20 MG tablet Take 3-5 tablets daily prn   tamsulosin (FLOMAX) 0.4 MG CAPS capsule    [DISCONTINUED] methylPREDNISolone (MEDROL DOSEPAK) 4 MG TBPK tablet Take as directed on package (Patient not taking: Reported on 03/09/2022)   [DISCONTINUED] oxybutynin (DITROPAN-XL) 5 MG 24 hr tablet TAKE 1 TABLET DAILY   No facility-administered medications prior to visit.    Review of Systems  Constitutional:  Negative for fever.  HENT:  Negative for congestion and sore throat.   Eyes:        Negative for visual changes  Respiratory:   Negative for cough and shortness of breath.   Cardiovascular:  Negative for chest pain, palpitations and leg swelling.  Gastrointestinal:  Negative for blood in stool, constipation and diarrhea.  Genitourinary:  Negative for dysuria, frequency and urgency.  Musculoskeletal:  Negative for myalgias.  Skin:  Negative for rash.  Neurological:  Negative for dizziness and headaches.  Hematological:  Does not bruise/bleed easily.  Psychiatric/Behavioral:  Negative for suicidal ideas. The patient is not nervous/anxious.         Objective:  BP 102/60 (BP Location: Right Arm, Patient Position: Sitting, Cuff Size: Small)   Pulse 77   Temp (!) 97.3 F (36.3 C) (Temporal)   Ht '5\' 10"'$  (1.778 m)   Wt 180 lb 6.4 oz (81.8 kg)   SpO2 96%   BMI 25.88 kg/m     Physical Exam Vitals and nursing note reviewed.  Constitutional:      General: He is not in acute distress.    Appearance: He is well-developed.  HENT:     Right Ear: External ear normal. There is impacted cerumen.     Left Ear: External ear normal. There is impacted cerumen.     Nose: Nose normal.     Mouth/Throat:     Pharynx: No oropharyngeal exudate.  Eyes:     Extraocular Movements: Extraocular movements intact.     Conjunctiva/sclera: Conjunctivae normal.  Cardiovascular:     Rate and Rhythm: Normal rate and regular rhythm.     Heart sounds: Normal heart sounds.  Pulmonary:     Effort: Pulmonary effort is normal. No respiratory distress.     Breath sounds: Normal breath sounds.  Chest:     Chest wall: No tenderness.  Abdominal:     General: Bowel sounds are normal.     Palpations: Abdomen is soft.  Genitourinary:    Comments: Deferred to GI and urology Musculoskeletal:        General: Normal range of motion.     Cervical back: Normal range of motion and neck supple.     Right lower leg: No edema.     Left lower leg: No edema.  Lymphadenopathy:     Cervical: No cervical adenopathy.  Skin:    General: Skin is warm  and dry.  Neurological:     Mental Status: He is alert and oriented to person, place, and time.     Deep Tendon Reflexes: Reflexes are normal and symmetric.  Psychiatric:        Mood and Affect: Mood normal.  Behavior: Behavior normal.        Thought Content: Thought content normal.      Results for orders placed or performed in visit on 03/09/22  PSA  Result Value Ref Range   PSA WNL   HM COLONOSCOPY  Result Value Ref Range   HM Colonoscopy See Report (in chart) See Report (in chart), Patient Reported      Assessment & Plan:    Routine Health Maintenance and Physical Exam  Immunization History  Administered Date(s) Administered   Fluad Quad(high Dose 65+) 06/10/2019, 06/08/2020, 07/04/2021   Influenza Split 05/21/2012, 05/27/2014   Influenza, High Dose Seasonal PF 06/05/2018   Influenza, Quadrivalent, Recombinant, Inj, Pf 05/25/2016   Influenza, Seasonal, Injecte, Preservative Fre 05/27/2015   Influenza-Unspecified 05/21/2012, 05/27/2014, 05/27/2015   Moderna SARS-COV2 Booster Vaccination 06/18/2020   Moderna Sars-Covid-2 Vaccination 09/19/2019, 10/17/2019   Pneumococcal Conjugate-13 06/05/2018   Pneumococcal Polysaccharide-23 12/09/2019   Tdap 11/30/2015   Zoster Recombinat (Shingrix) 06/09/2020, 03/08/2021    Health Maintenance  Topic Date Due   INFLUENZA VACCINE  03/07/2022   COVID-19 Vaccine (3 - Moderna series) 03/25/2022 (Originally 08/13/2020)   TETANUS/TDAP  11/29/2025   COLONOSCOPY (Pts 45-59yr Insurance coverage will need to be confirmed)  05/03/2026   Pneumonia Vaccine 71 Years old  Completed   Hepatitis C Screening  Completed   Zoster Vaccines- Shingrix  Completed   HPV VACCINES  Aged Out   Discussed health benefits of physical activity, and encouraged him to engage in regular exercise appropriate for his age and condition.  Problem List Items Addressed This Visit       Cardiovascular and Mediastinum   Essential hypertension    BP at goal  with amlodipine and benazepril BP Readings from Last 3 Encounters:  03/09/22 102/60  12/29/21 (!) 144/85  09/08/21 128/82   maintain med dose. Repeat CMP        Other   Hx of colonic polyp    No change in BM Last colonoscopy 04/2021 by [facility]: 1benign polyp Repeat in 7years      Pure hypercholesterolemia    LDL at goal with atorvastatin Repeat lipid panel      Relevant Orders   Lipid panel   Other Visit Diagnoses     Encounter for preventative adult health care exam with abnormal findings    -  Primary   Relevant Orders   Comprehensive metabolic panel      Return in about 6 months (around 09/09/2022) for HTN, hyperlipidemia (fasting).     CWilfred Lacy NP

## 2022-03-09 NOTE — Assessment & Plan Note (Addendum)
BP at goal with amlodipine and benazepril BP Readings from Last 3 Encounters:  03/09/22 102/60  12/29/21 (!) 144/85  09/08/21 128/82   maintain med dose. Repeat CMP

## 2022-03-09 NOTE — Assessment & Plan Note (Signed)
No change in BM Last colonoscopy 04/2021 by [facility]: 1benign polyp Repeat in 7years

## 2022-03-09 NOTE — Patient Instructions (Signed)
Maintain current medications Go to lab  Preventive Care 65 Years and Older, Male Preventive care refers to lifestyle choices and visits with your health care provider that can promote health and wellness. Preventive care visits are also called wellness exams. What can I expect for my preventive care visit? Counseling During your preventive care visit, your health care provider may ask about your: Medical history, including: Past medical problems. Family medical history. History of falls. Current health, including: Emotional well-being. Home life and relationship well-being. Sexual activity. Memory and ability to understand (cognition). Lifestyle, including: Alcohol, nicotine or tobacco, and drug use. Access to firearms. Diet, exercise, and sleep habits. Work and work Statistician. Sunscreen use. Safety issues such as seatbelt and bike helmet use. Physical exam Your health care provider will check your: Height and weight. These may be used to calculate your BMI (body mass index). BMI is a measurement that tells if you are at a healthy weight. Waist circumference. This measures the distance around your waistline. This measurement also tells if you are at a healthy weight and may help predict your risk of certain diseases, such as type 2 diabetes and high blood pressure. Heart rate and blood pressure. Body temperature. Skin for abnormal spots. What immunizations do I need?  Vaccines are usually given at various ages, according to a schedule. Your health care provider will recommend vaccines for you based on your age, medical history, and lifestyle or other factors, such as travel or where you work. What tests do I need? Screening Your health care provider may recommend screening tests for certain conditions. This may include: Lipid and cholesterol levels. Diabetes screening. This is done by checking your blood sugar (glucose) after you have not eaten for a while  (fasting). Hepatitis C test. Hepatitis B test. HIV (human immunodeficiency virus) test. STI (sexually transmitted infection) testing, if you are at risk. Lung cancer screening. Colorectal cancer screening. Prostate cancer screening. Abdominal aortic aneurysm (AAA) screening. You may need this if you are a current or former smoker. Talk with your health care provider about your test results, treatment options, and if necessary, the need for more tests. Follow these instructions at home: Eating and drinking  Eat a diet that includes fresh fruits and vegetables, whole grains, lean protein, and low-fat dairy products. Limit your intake of foods with high amounts of sugar, saturated fats, and salt. Take vitamin and mineral supplements as recommended by your health care provider. Do not drink alcohol if your health care provider tells you not to drink. If you drink alcohol: Limit how much you have to 0-2 drinks a day. Know how much alcohol is in your drink. In the [place] one drink equals one 12 oz bottle of beer (355 mL), one 5 oz glass of wine (148 mL), or one 1 oz glass of hard liquor (44 mL). Lifestyle Brush your teeth every morning and night with fluoride toothpaste. Floss one time each day. Exercise for at least 30 minutes 5 or more days each week. Do not use any products that contain nicotine or tobacco. These products include cigarettes, chewing tobacco, and vaping devices, such as e-cigarettes. If you need help quitting, ask your health care provider. Do not use drugs. If you are sexually active, practice safe sex. Use a condom or other form of protection to prevent STIs. Take aspirin only as told by your health care provider. Make sure that you understand how much to take and what form to take. Work with your health care provider  to find out whether it is safe and beneficial for you to take aspirin daily. Ask your health care provider if you need to take a cholesterol-lowering medicine  (statin). Find healthy ways to manage stress, such as: Meditation, yoga, or listening to music. Journaling. Talking to a trusted person. Spending time with friends and family. Safety Always wear your seat belt while driving or riding in a vehicle. Do not drive: If you have been drinking alcohol. Do not ride with someone who has been drinking. When you are tired or distracted. While texting. If you have been using any mind-altering substances or drugs. Wear a helmet and other protective equipment during sports activities. If you have firearms in your house, make sure you follow all gun safety procedures. Minimize exposure to UV radiation to reduce your risk of skin cancer. What's next? Visit your health care provider once a year for an annual wellness visit. Ask your health care provider how often you should have your eyes and teeth checked. Stay up to date on all vaccines. This information is not intended to replace advice given to you by your health care provider. Make sure you discuss any questions you have with your health care provider. Document Revised: 01/19/2021 Document Reviewed: 01/19/2021 Elsevier Patient Education  [place].

## 2022-03-14 ENCOUNTER — Encounter: Payer: Self-pay | Admitting: Physical Medicine and Rehabilitation

## 2022-03-17 ENCOUNTER — Encounter: Payer: Self-pay | Admitting: Physical Medicine and Rehabilitation

## 2022-03-17 ENCOUNTER — Ambulatory Visit (INDEPENDENT_AMBULATORY_CARE_PROVIDER_SITE_OTHER): Payer: BC Managed Care – PPO | Admitting: Physical Medicine and Rehabilitation

## 2022-03-17 VITALS — BP 122/83 | HR 81

## 2022-03-17 DIAGNOSIS — M5416 Radiculopathy, lumbar region: Secondary | ICD-10-CM

## 2022-03-17 DIAGNOSIS — M4726 Other spondylosis with radiculopathy, lumbar region: Secondary | ICD-10-CM | POA: Diagnosis not present

## 2022-03-17 DIAGNOSIS — M48062 Spinal stenosis, lumbar region with neurogenic claudication: Secondary | ICD-10-CM | POA: Diagnosis not present

## 2022-03-17 DIAGNOSIS — M47816 Spondylosis without myelopathy or radiculopathy, lumbar region: Secondary | ICD-10-CM

## 2022-03-17 MED ORDER — DIAZEPAM 5 MG PO TABS: ORAL_TABLET | ORAL | 0 refills | Status: DC

## 2022-03-17 NOTE — Progress Notes (Unsigned)
Cameron Rice - 71 y.o. male MRN [id number]  Date of birth: 10/28/1950  Office Visit Note: Visit Date: 03/17/2022 PCP: Flossie Buffy, NP Referred by: Flossie Buffy, NP  Subjective: Chief Complaint  Patient presents with   Left Thigh - Pain   Right Thigh - Pain   Right Knee - Pain   Left Knee - Pain   HPI: Cameron Rice is a 71 y.o. male who comes in today for evaluation of chronic, worsening and severe bilateral lower back pain radiating to buttocks and posterior thighs down to knees. Pain ongoing for several years and is exacerbated by prolonged standing and walking. He describes pain as sore, aching and tingling sensation, currently rates as 7 out of 10. Some relief of pain with rest and use of over the counter medications, states his pain dose ease after walking for several minutes. Patient has attended formal physical therapy at [place] in the past with minimal relief of pain. Recent lumbar MRI imaging exhibits moderate to severe spinal canal stenosis at L3-L4 and L5-S1 and severe at L4-L5. Bilateral L4 transforaminal epidural steroid injection performed I our office in May did provide greater than 80% relief for approximately 6 weeks. 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 Patient denies focal weakness. Patient denies recent trauma or falls.    Review of Systems  Musculoskeletal:  Positive for back pain.  Neurological:  Positive for tingling and sensory change. Negative for focal weakness and weakness.  All other systems reviewed and are negative.  Otherwise per HPI.  Assessment & Plan: Visit Diagnoses:    ICD-10-CM   1. Lumbar radiculopathy  M54.16 Ambulatory referral to Physical Medicine Rehab    2. Spinal stenosis of lumbar region with neurogenic claudication  M48.062 Ambulatory referral to Physical Medicine Rehab    3. Other spondylosis with radiculopathy,  lumbar region  M47.26 Ambulatory referral to Physical Medicine Rehab    4. Facet arthropathy, lumbar  M47.816 Ambulatory referral to Physical Medicine Rehab       Plan: Findings:  Chronic, worsening and severe bilateral lower back pain radiating to buttocks and posterior thighs down to knees. Patient continues to have severe pain despite good conservative therapies such as formal physical therapy, home exercise regimen, rest and use of medications. Patients clinical presentation and exam are consistent with neurogenic claudication as a result of spinal canal stenosis. He does have multilevel moderate-severe/severe spinal canal stenosis. Next step is to repeat bilateral L4 transforaminal epidural steroid injection under fluoroscopic guidance. If  pain relief is short lived post injection we did discuss possibility of surgical consult with Dr. Elwin Sleight at [facility] to discuss treatment options. Patient could be a candidate for multilevel decompression. Patient did voice concerns with anxiety related to procedure. I did prescribe pre-procedure Valium to take on day of injection. No red flag symptoms noted upon exam.     Meds & Orders:  Meds ordered this encounter  Medications   diazepam (VALIUM) 5 MG tablet    Sig: Take one tablet by mouth with food one hour prior to procedure. May repeat 30 minutes prior if needed.    Dispense:  2 tablet    Refill:  0    Orders Placed This Encounter  Procedures   Ambulatory referral to Physical Medicine Rehab    Follow-up: No follow-ups on file.   Procedures: No procedures performed  Clinical History: MRI LUMBAR SPINE WITHOUT CONTRAST   TECHNIQUE: Multiplanar, multisequence MR imaging of the lumbar spine was performed. No intravenous contrast was administered.   COMPARISON:  Lumbar spine radiographs 10/10/2021   FINDINGS: Segmentation: Transitional lumbosacral anatomy with partial lumbarization of S1. Rudimentary S1-2 disc.    Alignment:  Normal.   Vertebrae: No fracture, suspicious marrow lesion, or significant marrow edema.   Conus medullaris and cauda equina: Conus extends to the L1 level and is normal in signal. The cauda equina nerve roots are redundant/tortuous in appearance related to high-grade spinal stenosis.   Paraspinal and other soft tissues: 1.4 cm T2 hyperintensity in the left kidney, likely a cyst and for which no follow-up imaging is recommended.   Disc levels:   Disc desiccation in the lower lumbar spine with moderate disc space narrowing at L4-5 and mild narrowing at L5-S1. Diffuse congenital narrowing of the lumbar spinal canal due to short pedicles.   T12-L1 and L1-2: Negative.   L2-3: Mild disc bulging and mild facet hypertrophy result in mild spinal stenosis, mild bilateral lateral recess stenosis, and borderline bilateral neural foraminal stenosis.   L3-4: Disc bulging and moderate facet and ligamentum flavum hypertrophy result in moderate to severe spinal stenosis and moderate bilateral neural foraminal stenosis. Potential bilateral L4 nerve root impingement.   L4-5: Disc bulging and severe facet and ligamentum flavum hypertrophy result in severe spinal stenosis and moderate bilateral neural foraminal stenosis. Potential impingement of multiple nerve roots bilaterally at this level, particularly the L5 roots.   L5-S1: Disc bulging and severe facet and ligamentum flavum hypertrophy result in moderate to severe spinal stenosis and severe bilateral neural foraminal stenosis with potential bilateral L5 and S1 nerve root impingement. Bilateral facet joint effusions.   IMPRESSION: 1. Congenitally short pedicles with superimposed disc and facet degeneration resulting in severe spinal stenosis at L4-5 and moderate to severe spinal stenosis at L3-4 and L5-S1. 2. Severe bilateral neural foraminal stenosis at L5-S1. 3. Moderate bilateral neural foraminal stenosis at L3-4 and  L4-5.     Electronically Signed   By: Logan Bores M.D.   On: 11/09/2021 21:06   He reports that he has never smoked. He has never used smokeless tobacco. No results for input(s): "HGBA1C", "LABURIC" in the last 8760 hours.  Objective:  VS:  HT:    WT:   BMI:     BP:122/83  HR:81bpm  TEMP: ( )  RESP:  Physical Exam Vitals and nursing note reviewed.  HENT:     Head: Normocephalic and atraumatic.     Right Ear: External ear normal.     Left Ear: External ear normal.     Nose: Nose normal.     Mouth/Throat:     Mouth: Mucous membranes are moist.  Eyes:     Extraocular Movements: Extraocular movements intact.  Cardiovascular:     Rate and Rhythm: Normal rate.     Pulses: Normal pulses.  Pulmonary:     Effort: Pulmonary effort is normal.  Abdominal:     General: Abdomen is flat. There is no distension.  Musculoskeletal:        General: Tenderness present.     Cervical back: Normal range of motion.     Comments: Pt rises from seated position to standing without difficulty. Good lumbar range of motion. Strong distal strength without clonus, no pain upon palpation of greater trochanters. Sensation intact bilaterally. Walks independently, gait steady.   Skin:    General: Skin is warm and  dry.     Capillary Refill: Capillary refill takes less than 2 seconds.  Neurological:     General: No focal deficit present.     Mental Status: He is alert and oriented to person, place, and time.  Psychiatric:        Mood and Affect: Mood normal.        Behavior: Behavior normal.     Ortho Exam  Imaging: No results found.  Past Medical/Family/Surgical/Social History: Medications & Allergies reviewed per EMR, new medications updated. Patient Active Problem List   Diagnosis Date Noted   Urinary frequency 07/04/2021   Generalized abdominal pain 07/04/2021   Chronic right-sided low back pain 12/22/2020   Hx of colonic polyp 12/10/2018   AC joint arthropathy 11/26/2018   Need for  prophylactic vaccination against Streptococcus pneumoniae (pneumococcus) and influenza 01/05/5614   Complicated UTI (urinary tract infection) 08/29/2017   Erectile disorder due to medical condition in male 08/29/2017   Shift work sleep disorder 09/04/2013   Essential hypertension 09/06/2011   BPH (benign prostatic hyperplasia) 09/06/2011   Pure hypercholesterolemia 09/06/2011   Hypogonadism male 09/06/2011   Past Medical History:  Diagnosis Date   Hyperlipidemia    Hypertension    UTI (urinary tract infection)    Family History  Problem Relation Age of Onset   Cancer Mother    Cancer Brother        liver cancer   Cancer Maternal Aunt        lung   Past Surgical History:  Procedure Laterality Date   URETHRA SURGERY     went in clean make sure urethra is clear   Social History   Occupational History   Not on file  Tobacco Use   Smoking status: Never   Smokeless tobacco: Never  Vaping Use   Vaping Use: Never used  Substance and Sexual Activity   Alcohol use: No   Drug use: No   Sexual activity: Yes

## 2022-03-17 NOTE — Progress Notes (Unsigned)
Pt has hx of inj on 12/29/21 pt state it helped for a month with 100% relief. Pt state he has pain in his buttocks that travels posterior down his both thigh to his knee. Pt state sitting for a long time and than having to get up makes the pain worse. Pt state he takes over the counter pain meds to help ease his pain.  Numeric Pain Rating Scale and Functional Assessment Average Pain 10 Pain Right Now 7 My pain is intermittent, sharp, stabbing, tingling, and aching Pain is worse with: walking, sitting, standing, and some activites Pain improves with: medication and injections   In the last MONTH (on 0-10 scale) has pain interfered with the following?  1. General activity like being  able to carry out your everyday physical activities such as walking, climbing stairs, carrying groceries, or moving a chair?  Rating(5)  2. Relation with others like being able to carry out your usual social activities and roles such as  activities at home, at work and in your community. Rating(6)  3. Enjoyment of life such that you have  been bothered by emotional problems such as feeling anxious, depressed or irritable?  Rating(7)

## 2022-03-23 ENCOUNTER — Telehealth: Payer: Self-pay | Admitting: Physical Medicine and Rehabilitation

## 2022-03-23 NOTE — Telephone Encounter (Signed)
Pt call to set an referral appt. Please call pt at [phone]

## 2022-04-04 ENCOUNTER — Ambulatory Visit: Payer: BC Managed Care – PPO

## 2022-04-06 ENCOUNTER — Ambulatory Visit (INDEPENDENT_AMBULATORY_CARE_PROVIDER_SITE_OTHER): Payer: BC Managed Care – PPO | Admitting: Physical Medicine and Rehabilitation

## 2022-04-06 ENCOUNTER — Ambulatory Visit: Payer: Self-pay

## 2022-04-06 ENCOUNTER — Encounter: Payer: Self-pay | Admitting: Physical Medicine and Rehabilitation

## 2022-04-06 VITALS — BP 109/71 | HR 73

## 2022-04-06 DIAGNOSIS — M5416 Radiculopathy, lumbar region: Secondary | ICD-10-CM

## 2022-04-06 MED ORDER — METHYLPREDNISOLONE ACETATE 80 MG/ML IJ SUSP
80.0000 mg | Freq: Once | INTRAMUSCULAR | Status: AC
  Administered 2022-04-06: 80 mg

## 2022-04-06 NOTE — Progress Notes (Signed)
Pt state lower back pain that travels his buttocks that travels posterior down his both thigh to his knee. Pt state sitting for a long time and than having to get up makes the pain worse. Pt state he takes over the counter pain meds to help ease his pain.  Numeric Pain Rating Scale and Functional Assessment Average Pain 1   In the last MONTH (on 0-10 scale) has pain interfered with the following?  1. General activity like being  able to carry out your everyday physical activities such as walking, climbing stairs, carrying groceries, or moving a chair?  Rating(8)   +Driver, -BT, -Dye Allergies.

## 2022-04-06 NOTE — Patient Instructions (Signed)

## 2022-04-07 NOTE — Procedures (Signed)
Lumbosacral Transforaminal Epidural Steroid Injection - Sub-Pedicular Approach with Fluoroscopic Guidance  Patient: Cameron Rice      Date of Birth: Oct 16, 1950 MRN: [id number] PCP: Flossie Buffy, NP      Visit Date: 04/06/2022   Universal Protocol:    Date/Time: 04/06/2022  Consent Given By: the patient  Position: PRONE  Additional Comments: Vital signs were monitored before and after the procedure. Patient was prepped and draped in the usual sterile fashion. The correct patient, procedure, and site was verified.   Injection Procedure Details:   Procedure diagnoses: Lumbar radiculopathy [M54.16]    Meds Administered:  Meds ordered this encounter  Medications   methylPREDNISolone acetate (DEPO-MEDROL) injection 80 mg    Laterality: Bilateral  Location/Site: L4  Needle:5.0 in., 22 ga.  Short bevel or Quincke spinal needle  Needle Placement: Transforaminal  Findings:    -Comments: Excellent flow of contrast along the nerve, nerve root and into the epidural space.  Procedure Details: After squaring off the end-plates to get a true AP view, the C-arm was positioned so that an oblique view of the foramen as noted above was visualized. The target area is just inferior to the "nose of the scotty dog" or sub pedicular. The soft tissues overlying this structure were infiltrated with 2-3 ml. of 1% Lidocaine without Epinephrine.  The spinal needle was inserted toward the target using a "trajectory" view along the fluoroscope beam.  Under AP and lateral visualization, the needle was advanced so it did not puncture dura and was located close the 6 O'Clock position of the pedical in AP tracterory. Biplanar projections were used to confirm position. Aspiration was confirmed to be negative for CSF and/or blood. A 1-2 ml. volume of Isovue-250 was injected and flow of contrast was noted at each level. Radiographs were obtained for documentation purposes.   After attaining the  desired flow of contrast documented above, a 0.5 to 1.0 ml test dose of 0.25% Marcaine was injected into each respective transforaminal space.  The patient was observed for 90 seconds post injection.  After no sensory deficits were reported, and normal lower extremity motor function was noted,   the above injectate was administered so that equal amounts of the injectate were placed at each foramen (level) into the transforaminal epidural space.   Additional Comments:  The patient tolerated the procedure well Dressing: 2 x 2 sterile gauze and Band-Aid    Post-procedure details: Patient was observed during the procedure. Post-procedure instructions were reviewed.  Patient left the clinic in stable condition.

## 2022-04-07 NOTE — Progress Notes (Signed)
Cameron Rice - 71 y.o. male MRN [id number]  Date of birth: 07/20/1951  Office Visit Note: Visit Date: 04/06/2022 PCP: Flossie Buffy, NP Referred by: Flossie Buffy, NP  Subjective: Chief Complaint  Patient presents with   Lower Back - Pain   Right Thigh - Pain   Left Thigh - Pain   Right Knee - Pain   Left Knee - Pain   HPI:  Cameron Rice is a 71 y.o. male who comes in today at the request of Barnet Pall, FNP for planned Bilateral L4-5 Lumbar Transforaminal epidural steroid injection with fluoroscopic guidance.  The patient has failed conservative care including home exercise, medications, time and activity modification.  This injection will be diagnostic and hopefully therapeutic.  Please see requesting physician notes for further details and justification.   ROS Otherwise per HPI.  Assessment & Plan: Visit Diagnoses:    ICD-10-CM   1. Lumbar radiculopathy  M54.16 XR C-ARM NO REPORT    Epidural Steroid injection    methylPREDNISolone acetate (DEPO-MEDROL) injection 80 mg      Plan: No additional findings.   Meds & Orders:  Meds ordered this encounter  Medications   methylPREDNISolone acetate (DEPO-MEDROL) injection 80 mg    Orders Placed This Encounter  Procedures   XR C-ARM NO REPORT   Epidural Steroid injection    Follow-up: Return for visit to requesting provider as needed.   Procedures: No procedures performed  Lumbosacral Transforaminal Epidural Steroid Injection - Sub-Pedicular Approach with Fluoroscopic Guidance  Patient: Cameron Rice      Date of Birth: Feb 17, 1951 MRN: [id number] PCP: Flossie Buffy, NP      Visit Date: 04/06/2022   Universal Protocol:    Date/Time: 04/06/2022  Consent Given By: the patient  Position: PRONE  Additional Comments: Vital signs were monitored before and after the procedure. Patient was prepped and draped in the usual sterile fashion. The correct patient, procedure, and site was  verified.   Injection Procedure Details:   Procedure diagnoses: Lumbar radiculopathy [M54.16]    Meds Administered:  Meds ordered this encounter  Medications   methylPREDNISolone acetate (DEPO-MEDROL) injection 80 mg    Laterality: Bilateral  Location/Site: L4  Needle:5.0 in., 22 ga.  Short bevel or Quincke spinal needle  Needle Placement: Transforaminal  Findings:    -Comments: Excellent flow of contrast along the nerve, nerve root and into the epidural space.  Procedure Details: After squaring off the end-plates to get a true AP view, the C-arm was positioned so that an oblique view of the foramen as noted above was visualized. The target area is just inferior to the "nose of the scotty dog" or sub pedicular. The soft tissues overlying this structure were infiltrated with 2-3 ml. of 1% Lidocaine without Epinephrine.  The spinal needle was inserted toward the target using a "trajectory" view along the fluoroscope beam.  Under AP and lateral visualization, the needle was advanced so it did not puncture dura and was located close the 6 O'Clock position of the pedical in AP tracterory. Biplanar projections were used to confirm position. Aspiration was confirmed to be negative for CSF and/or blood. A 1-2 ml. volume of Isovue-250 was injected and flow of contrast was noted at each level. Radiographs were obtained for documentation purposes.   After attaining the desired flow of contrast documented above, a 0.5 to 1.0 ml test dose of 0.25% Marcaine was injected into each respective transforaminal space.  The patient was observed for  90 seconds post injection.  After no sensory deficits were reported, and normal lower extremity motor function was noted,   the above injectate was administered so that equal amounts of the injectate were placed at each foramen (level) into the transforaminal epidural space.   Additional Comments:  The patient tolerated the procedure well Dressing: 2 x 2  sterile gauze and Band-Aid    Post-procedure details: Patient was observed during the procedure. Post-procedure instructions were reviewed.  Patient left the clinic in stable condition.    Clinical History: MRI LUMBAR SPINE WITHOUT CONTRAST   TECHNIQUE: Multiplanar, multisequence MR imaging of the lumbar spine was performed. No intravenous contrast was administered.   COMPARISON:  Lumbar spine radiographs 10/10/2021   FINDINGS: Segmentation: Transitional lumbosacral anatomy with partial lumbarization of S1. Rudimentary S1-2 disc.   Alignment:  Normal.   Vertebrae: No fracture, suspicious marrow lesion, or significant marrow edema.   Conus medullaris and cauda equina: Conus extends to the L1 level and is normal in signal. The cauda equina nerve roots are redundant/tortuous in appearance related to high-grade spinal stenosis.   Paraspinal and other soft tissues: 1.4 cm T2 hyperintensity in the left kidney, likely a cyst and for which no follow-up imaging is recommended.   Disc levels:   Disc desiccation in the lower lumbar spine with moderate disc space narrowing at L4-5 and mild narrowing at L5-S1. Diffuse congenital narrowing of the lumbar spinal canal due to short pedicles.   T12-L1 and L1-2: Negative.   L2-3: Mild disc bulging and mild facet hypertrophy result in mild spinal stenosis, mild bilateral lateral recess stenosis, and borderline bilateral neural foraminal stenosis.   L3-4: Disc bulging and moderate facet and ligamentum flavum hypertrophy result in moderate to severe spinal stenosis and moderate bilateral neural foraminal stenosis. Potential bilateral L4 nerve root impingement.   L4-5: Disc bulging and severe facet and ligamentum flavum hypertrophy result in severe spinal stenosis and moderate bilateral neural foraminal stenosis. Potential impingement of multiple nerve roots bilaterally at this level, particularly the L5 roots.   L5-S1: Disc  bulging and severe facet and ligamentum flavum hypertrophy result in moderate to severe spinal stenosis and severe bilateral neural foraminal stenosis with potential bilateral L5 and S1 nerve root impingement. Bilateral facet joint effusions.   IMPRESSION: 1. Congenitally short pedicles with superimposed disc and facet degeneration resulting in severe spinal stenosis at L4-5 and moderate to severe spinal stenosis at L3-4 and L5-S1. 2. Severe bilateral neural foraminal stenosis at L5-S1. 3. Moderate bilateral neural foraminal stenosis at L3-4 and L4-5.     Electronically Signed   By: Logan Bores M.D.   On: 11/09/2021 21:06     Objective:  VS:  HT:    WT:   BMI:     BP:109/71  HR:73bpm  TEMP: ( )  RESP:  Physical Exam Vitals and nursing note reviewed.  Constitutional:      General: He is not in acute distress.    Appearance: Normal appearance. He is not ill-appearing.  HENT:     Head: Normocephalic and atraumatic.     Right Ear: External ear normal.     Left Ear: External ear normal.     Nose: No congestion.  Eyes:     Extraocular Movements: Extraocular movements intact.  Cardiovascular:     Rate and Rhythm: Normal rate.     Pulses: Normal pulses.  Pulmonary:     Effort: Pulmonary effort is normal. No respiratory distress.  Abdominal:  General: There is no distension.     Palpations: Abdomen is soft.  Musculoskeletal:        General: No tenderness or signs of injury.     Cervical back: Neck supple.     Right lower leg: No edema.     Left lower leg: No edema.     Comments: Patient has good distal strength without clonus.  Skin:    Findings: No erythema or rash.  Neurological:     General: No focal deficit present.     Mental Status: He is alert and oriented to person, place, and time.     Sensory: No sensory deficit.     Motor: No weakness or abnormal muscle tone.     Coordination: Coordination normal.  Psychiatric:        Mood and Affect: Mood normal.         Behavior: Behavior normal.      Imaging: XR C-ARM NO REPORT  Result Date: 04/06/2022 Please see Notes tab for imaging impression.

## 2022-04-11 ENCOUNTER — Ambulatory Visit: Payer: BC Managed Care – PPO

## 2022-04-24 ENCOUNTER — Encounter: Payer: Self-pay | Admitting: Nurse Practitioner

## 2022-04-24 ENCOUNTER — Ambulatory Visit (INDEPENDENT_AMBULATORY_CARE_PROVIDER_SITE_OTHER): Payer: BC Managed Care – PPO | Admitting: Nurse Practitioner

## 2022-04-24 VITALS — BP 112/78 | HR 79 | Temp 97.9°F | Ht 70.0 in | Wt 176.4 lb

## 2022-04-24 DIAGNOSIS — Z23 Encounter for immunization: Secondary | ICD-10-CM | POA: Diagnosis not present

## 2022-04-24 DIAGNOSIS — K420 Umbilical hernia with obstruction, without gangrene: Secondary | ICD-10-CM | POA: Diagnosis not present

## 2022-04-24 DIAGNOSIS — R1033 Periumbilical pain: Secondary | ICD-10-CM | POA: Diagnosis not present

## 2022-04-24 NOTE — Patient Instructions (Addendum)
You will be contacted to schedule appt for ABD [place]  Umbilical Hernia, Adult  A hernia is a bulge of tissue that pushes through an opening between muscles. An umbilical hernia happens in the abdomen, near the belly button (umbilicus). The hernia may contain tissues from the small intestine, large intestine, or fatty tissue covering the intestines. Umbilical hernias in adults tend to get worse over time, and they require surgical treatment. There are different types of umbilical hernias, including: Indirect hernia. This type is located just above or below the umbilicus. It is the most common type of umbilical hernia in adults. Direct hernia. This type forms through an opening formed by the umbilicus. Reducible hernia. This type of hernia comes and goes. It may be visible only when you strain, lift something heavy, or cough. This type of hernia can be pushed back into the abdomen (reduced). Incarcerated hernia. This type traps abdominal tissue inside the hernia. This type of hernia cannot be reduced. Strangulated hernia. This type of hernia cuts off blood flow to the tissues inside the hernia. The tissues can start to die if this happens. This type of hernia requires emergency treatment. What are the causes? An umbilical hernia happens when tissue inside the abdomen presses on a weak area of the abdominal muscles. What increases the risk? You may have a greater risk of this condition if you: Are obese. Have had several pregnancies. Have a buildup of fluid inside your abdomen. Have had surgery that weakens the abdominal muscles. What are the signs or symptoms? The main symptom of this condition is a painless bulge at or near the belly button. A reducible hernia may be visible only when you strain, lift something heavy, or cough. Other symptoms may include: Dull pain. A feeling of pressure. Symptoms of a strangulated hernia may include: Pain that gets increasingly worse. Nausea and  vomiting. Pain when pressing on the hernia. Skin over the hernia becoming red or purple. Constipation. Blood in the stool. How is this diagnosed? This condition may be diagnosed based on: A physical exam. You may be asked to cough or strain while standing. These actions increase the pressure inside your abdomen and can force the hernia through the opening in your muscles. Your health care provider may try to reduce the hernia by pressing on it. Your symptoms and medical history. How is this treated? Surgery is the only treatment for an umbilical hernia. Surgery for a strangulated hernia is done as soon as possible. If you have a small hernia that is not incarcerated, you may need to lose weight before having surgery. Follow these instructions at home: Lose weight, if told by your health care provider. Do not try to push the hernia back in. Watch your hernia for any changes in color or size. Tell your health care provider if any changes occur. You may need to avoid activities that increase pressure on your hernia. Do not lift anything that is heavier than 10 lb (4.5 kg), or the limit that you are told, until your health care provider says that it is safe. Take over-the-counter and prescription medicines only as told by your health care provider. Keep all follow-up visits. This is important. Contact a health care provider if: Your hernia gets larger. Your hernia becomes painful. Get help right away if: You develop sudden, severe pain near the area of your hernia. You have pain as well as nausea or vomiting. You have pain and the skin over your hernia changes color. You develop  a fever or chills. Summary A hernia is a bulge of tissue that pushes through an opening between muscles. An umbilical hernia happens near the belly button. Surgery is the only treatment for an umbilical hernia. Do not try to push your hernia back in. Keep all follow-up visits. This is important. This information  is not intended to replace advice given to you by your health care provider. Make sure you discuss any questions you have with your health care provider. Document Revised: 03/01/2020 Document Reviewed: 03/01/2020 Elsevier Patient Education  [place].

## 2022-04-24 NOTE — Progress Notes (Signed)
                Established Patient Visit  Patient: Cameron Rice   DOB: 1951-05-09   71 y.o. Male  MRN: [id number] Visit Date: 04/24/2022  Subjective:    Chief Complaint  Patient presents with   Acute Visit    C/o sore belly button x 6 weeks  No other concerns  Flu shot given today    Abdominal Pain This is a new problem. The current episode started more than 1 month ago. The onset quality is sudden. The problem occurs constantly. The problem has been unchanged. The pain is located in the periumbilical region. The pain is moderate. The quality of the pain is dull and a sensation of fullness. The abdominal pain does not radiate. Pertinent negatives include no anorexia, constipation, diarrhea, nausea or vomiting. The pain is aggravated by palpation. The pain is relieved by Nothing. He has tried nothing for the symptoms. There is no history of gallstones, GERD, irritable bowel syndrome, pancreatitis or PUD.   Reviewed medical, surgical, and social history today  Medications: Outpatient Medications Prior to Visit  Medication Sig   amLODipine-benazepril (LOTREL) 10-20 MG capsule Take 1 capsule by mouth daily.   atorvastatin (LIPITOR) 40 MG tablet TAKE 1 TABLET DAILY AT 6PM   cyclobenzaprine (FLEXERIL) 5 MG tablet Take 1 tablet (5 mg total) by mouth at bedtime.   Multiple Vitamins-Minerals (ONE-A-DAY MENS 50+ ADVANTAGE) TABS    oxybutynin (DITROPAN-XL) 5 MG 24 hr tablet Take 5 mg by mouth daily.   sildenafil (REVATIO) 20 MG tablet Take 3-5 tablets daily prn   tamsulosin (FLOMAX) 0.4 MG CAPS capsule    diazepam (VALIUM) 5 MG tablet Take one tablet by mouth with food one hour prior to procedure. May repeat 30 minutes prior if needed. (Patient not taking: Reported on 04/24/2022)   No facility-administered medications prior to visit.   Reviewed past medical and social history.   ROS per HPI above      Objective:  BP 112/78 (BP Location: Right Arm, Patient Position: Sitting, Cuff Size:  Normal)   Pulse 79   Temp 97.9 F (36.6 C) (Temporal)   Ht '5\' 10"'$  (1.778 m)   Wt 176 lb 6.4 oz (80 kg)   SpO2 95%   BMI 25.31 kg/m      Physical Exam Vitals reviewed.  Abdominal:     General: Bowel sounds are normal.     Palpations: Abdomen is soft.     Tenderness: There is no guarding.     Hernia: A hernia is present. Hernia is present in the umbilical area.     Comments: Unable to reduce hernia  Neurological:     Mental Status: He is alert.     No results found for any visits on 04/24/22.    Assessment & Plan:    Problem List Items Addressed This Visit   None Visit Diagnoses     Umbilical pain    -  Primary   Relevant Orders   US Abdomen Limited   Need for immunization against influenza       Relevant Orders   Flu Vaccine QUAD High Dose(Fluad) (Completed)      Return if symptoms worsen or fail to improve.     Wilfred Lacy, NP

## 2022-04-25 ENCOUNTER — Ambulatory Visit
Admission: RE | Admit: 2022-04-25 | Discharge: 2022-04-25 | Disposition: A | Discharge status: Home / Self Care | Payer: BC Managed Care – PPO | Source: Ambulatory Visit | Attending: Nurse Practitioner | Admitting: Nurse Practitioner

## 2022-04-25 DIAGNOSIS — R1033 Periumbilical pain: Secondary | ICD-10-CM

## 2022-04-27 NOTE — Addendum Note (Signed)
Addended by: Wilfred Lacy L on: 04/27/2022 11:43 AM   Modules accepted: Orders

## 2022-04-28 ENCOUNTER — Ambulatory Visit: Payer: BC Managed Care – PPO

## 2022-05-09 ENCOUNTER — Ambulatory Visit: Payer: BC Managed Care – PPO | Admitting: Nurse Practitioner

## 2022-05-25 ENCOUNTER — Encounter (INDEPENDENT_AMBULATORY_CARE_PROVIDER_SITE_OTHER): Payer: BC Managed Care – PPO | Admitting: Nurse Practitioner

## 2022-05-25 DIAGNOSIS — N390 Urinary tract infection, site not specified: Secondary | ICD-10-CM

## 2022-06-13 ENCOUNTER — Telehealth: Payer: Self-pay | Admitting: Nurse Practitioner

## 2022-06-13 DIAGNOSIS — N390 Urinary tract infection, site not specified: Secondary | ICD-10-CM | POA: Diagnosis not present

## 2022-06-13 MED ORDER — CIPROFLOXACIN HCL 500 MG PO TABS: 500.0000 mg | ORAL_TABLET | Freq: Two times a day (BID) | ORAL | 0 refills | Status: AC

## 2022-06-13 NOTE — Telephone Encounter (Signed)
Unable to reach pt, lvm for pt to call back to schedule an appointment for urinary concerns

## 2022-06-13 NOTE — Telephone Encounter (Signed)
Please see the MyChart message reply(ies) for my assessment and plan.  The patient gave consent for this Medical Advice Message and is aware that it may result in a bill to their insurance company as well as the possibility that this may result in a co-payment or deductible. They are an established patient, but are not seeking medical advice exclusively about a problem treated during an in person or video visit in the last 7 days. I did not recommend an in person or video visit within 7 days of my reply.  I spent a total of 10 minutes cumulative time within 7 days through MyChart messaging Leylanie Woodmansee, NP  

## 2022-06-13 NOTE — Telephone Encounter (Signed)
Pt stated he has UTI can you call a prescription in or pt can come in and give urine sample

## 2022-06-15 NOTE — Telephone Encounter (Signed)
Called & spoke w/ pt. Says he picked up his antibiotic yesterday and he's slowly getting better

## 2022-06-21 ENCOUNTER — Emergency Department (HOSPITAL_BASED_OUTPATIENT_CLINIC_OR_DEPARTMENT_OTHER): Payer: BC Managed Care – PPO

## 2022-06-21 ENCOUNTER — Other Ambulatory Visit: Payer: Self-pay

## 2022-06-21 ENCOUNTER — Encounter (HOSPITAL_BASED_OUTPATIENT_CLINIC_OR_DEPARTMENT_OTHER): Payer: Self-pay | Admitting: Emergency Medicine

## 2022-06-21 ENCOUNTER — Inpatient Hospital Stay (HOSPITAL_BASED_OUTPATIENT_CLINIC_OR_DEPARTMENT_OTHER)
Admission: EM | Admit: 2022-06-21 | Discharge: 2022-08-07 | DRG: 356 | Disposition: E | Payer: BC Managed Care – PPO | Attending: Student | Admitting: Student

## 2022-06-21 ENCOUNTER — Encounter (HOSPITAL_COMMUNITY): Payer: Self-pay

## 2022-06-21 DIAGNOSIS — R0603 Acute respiratory distress: Secondary | ICD-10-CM | POA: Diagnosis not present

## 2022-06-21 DIAGNOSIS — R209 Unspecified disturbances of skin sensation: Secondary | ICD-10-CM | POA: Diagnosis not present

## 2022-06-21 DIAGNOSIS — E872 Acidosis, unspecified: Secondary | ICD-10-CM | POA: Diagnosis not present

## 2022-06-21 DIAGNOSIS — C801 Malignant (primary) neoplasm, unspecified: Secondary | ICD-10-CM | POA: Diagnosis present

## 2022-06-21 DIAGNOSIS — K659 Peritonitis, unspecified: Secondary | ICD-10-CM | POA: Diagnosis present

## 2022-06-21 DIAGNOSIS — Z66 Do not resuscitate: Secondary | ICD-10-CM | POA: Diagnosis not present

## 2022-06-21 DIAGNOSIS — Z801 Family history of malignant neoplasm of trachea, bronchus and lung: Secondary | ICD-10-CM

## 2022-06-21 DIAGNOSIS — N401 Enlarged prostate with lower urinary tract symptoms: Secondary | ICD-10-CM | POA: Diagnosis not present

## 2022-06-21 DIAGNOSIS — C786 Secondary malignant neoplasm of retroperitoneum and peritoneum: Secondary | ICD-10-CM | POA: Diagnosis not present

## 2022-06-21 DIAGNOSIS — M79A3 Nontraumatic compartment syndrome of abdomen: Secondary | ICD-10-CM | POA: Diagnosis present

## 2022-06-21 DIAGNOSIS — E8809 Other disorders of plasma-protein metabolism, not elsewhere classified: Secondary | ICD-10-CM | POA: Diagnosis not present

## 2022-06-21 DIAGNOSIS — K56609 Unspecified intestinal obstruction, unspecified as to partial versus complete obstruction: Secondary | ICD-10-CM

## 2022-06-21 DIAGNOSIS — A419 Sepsis, unspecified organism: Secondary | ICD-10-CM | POA: Diagnosis not present

## 2022-06-21 DIAGNOSIS — R188 Other ascites: Secondary | ICD-10-CM | POA: Diagnosis not present

## 2022-06-21 DIAGNOSIS — E2740 Unspecified adrenocortical insufficiency: Secondary | ICD-10-CM | POA: Diagnosis not present

## 2022-06-21 DIAGNOSIS — R7401 Elevation of levels of liver transaminase levels: Secondary | ICD-10-CM | POA: Diagnosis present

## 2022-06-21 DIAGNOSIS — Z7189 Other specified counseling: Secondary | ICD-10-CM

## 2022-06-21 DIAGNOSIS — Y838 Other surgical procedures as the cause of abnormal reaction of the patient, or of later complication, without mention of misadventure at the time of the procedure: Secondary | ICD-10-CM | POA: Diagnosis present

## 2022-06-21 DIAGNOSIS — E440 Moderate protein-calorie malnutrition: Secondary | ICD-10-CM | POA: Diagnosis present

## 2022-06-21 DIAGNOSIS — K209 Esophagitis, unspecified without bleeding: Secondary | ICD-10-CM | POA: Diagnosis present

## 2022-06-21 DIAGNOSIS — K661 Hemoperitoneum: Secondary | ICD-10-CM | POA: Diagnosis present

## 2022-06-21 DIAGNOSIS — E7800 Pure hypercholesterolemia, unspecified: Secondary | ICD-10-CM | POA: Diagnosis present

## 2022-06-21 DIAGNOSIS — J690 Pneumonitis due to inhalation of food and vomit: Secondary | ICD-10-CM | POA: Diagnosis not present

## 2022-06-21 DIAGNOSIS — D75A Glucose-6-phosphate dehydrogenase (G6PD) deficiency without anemia: Secondary | ICD-10-CM | POA: Diagnosis present

## 2022-06-21 DIAGNOSIS — R748 Abnormal levels of other serum enzymes: Secondary | ICD-10-CM

## 2022-06-21 DIAGNOSIS — Z79899 Other long term (current) drug therapy: Secondary | ICD-10-CM

## 2022-06-21 DIAGNOSIS — E876 Hypokalemia: Secondary | ICD-10-CM | POA: Diagnosis not present

## 2022-06-21 DIAGNOSIS — E875 Hyperkalemia: Secondary | ICD-10-CM | POA: Diagnosis present

## 2022-06-21 DIAGNOSIS — R7989 Other specified abnormal findings of blood chemistry: Secondary | ICD-10-CM | POA: Diagnosis not present

## 2022-06-21 DIAGNOSIS — R4589 Other symptoms and signs involving emotional state: Secondary | ICD-10-CM

## 2022-06-21 DIAGNOSIS — N390 Urinary tract infection, site not specified: Secondary | ICD-10-CM | POA: Diagnosis not present

## 2022-06-21 DIAGNOSIS — Z515 Encounter for palliative care: Secondary | ICD-10-CM | POA: Diagnosis not present

## 2022-06-21 DIAGNOSIS — R180 Malignant ascites: Secondary | ICD-10-CM | POA: Diagnosis not present

## 2022-06-21 DIAGNOSIS — N170 Acute kidney failure with tubular necrosis: Secondary | ICD-10-CM | POA: Diagnosis not present

## 2022-06-21 DIAGNOSIS — K9189 Other postprocedural complications and disorders of digestive system: Secondary | ICD-10-CM | POA: Diagnosis not present

## 2022-06-21 DIAGNOSIS — R350 Frequency of micturition: Secondary | ICD-10-CM | POA: Diagnosis not present

## 2022-06-21 DIAGNOSIS — E781 Pure hyperglyceridemia: Secondary | ICD-10-CM | POA: Diagnosis present

## 2022-06-21 DIAGNOSIS — R52 Pain, unspecified: Secondary | ICD-10-CM | POA: Diagnosis not present

## 2022-06-21 DIAGNOSIS — J9601 Acute respiratory failure with hypoxia: Secondary | ICD-10-CM | POA: Diagnosis not present

## 2022-06-21 DIAGNOSIS — Z9911 Dependence on respirator [ventilator] status: Secondary | ICD-10-CM

## 2022-06-21 DIAGNOSIS — I10 Essential (primary) hypertension: Secondary | ICD-10-CM | POA: Diagnosis present

## 2022-06-21 DIAGNOSIS — J155 Pneumonia due to Escherichia coli: Secondary | ICD-10-CM | POA: Diagnosis not present

## 2022-06-21 DIAGNOSIS — I2782 Chronic pulmonary embolism: Secondary | ICD-10-CM | POA: Diagnosis present

## 2022-06-21 DIAGNOSIS — Z8719 Personal history of other diseases of the digestive system: Secondary | ICD-10-CM

## 2022-06-21 DIAGNOSIS — K9131 Postprocedural partial intestinal obstruction: Secondary | ICD-10-CM | POA: Diagnosis present

## 2022-06-21 DIAGNOSIS — D509 Iron deficiency anemia, unspecified: Secondary | ICD-10-CM | POA: Diagnosis present

## 2022-06-21 DIAGNOSIS — Z781 Physical restraint status: Secondary | ICD-10-CM

## 2022-06-21 DIAGNOSIS — C799 Secondary malignant neoplasm of unspecified site: Secondary | ICD-10-CM | POA: Diagnosis not present

## 2022-06-21 DIAGNOSIS — I82421 Acute embolism and thrombosis of right iliac vein: Secondary | ICD-10-CM | POA: Diagnosis not present

## 2022-06-21 DIAGNOSIS — Z86010 Personal history of colonic polyps: Secondary | ICD-10-CM

## 2022-06-21 DIAGNOSIS — K2970 Gastritis, unspecified, without bleeding: Secondary | ICD-10-CM | POA: Diagnosis present

## 2022-06-21 DIAGNOSIS — N400 Enlarged prostate without lower urinary tract symptoms: Secondary | ICD-10-CM | POA: Diagnosis present

## 2022-06-21 DIAGNOSIS — K567 Ileus, unspecified: Secondary | ICD-10-CM

## 2022-06-21 DIAGNOSIS — K259 Gastric ulcer, unspecified as acute or chronic, without hemorrhage or perforation: Secondary | ICD-10-CM | POA: Diagnosis present

## 2022-06-21 DIAGNOSIS — Z803 Family history of malignant neoplasm of breast: Secondary | ICD-10-CM

## 2022-06-21 DIAGNOSIS — R6521 Severe sepsis with septic shock: Secondary | ICD-10-CM | POA: Diagnosis not present

## 2022-06-21 DIAGNOSIS — E871 Hypo-osmolality and hyponatremia: Secondary | ICD-10-CM | POA: Diagnosis not present

## 2022-06-21 DIAGNOSIS — R64 Cachexia: Secondary | ICD-10-CM | POA: Diagnosis present

## 2022-06-21 DIAGNOSIS — Z809 Family history of malignant neoplasm, unspecified: Secondary | ICD-10-CM

## 2022-06-21 DIAGNOSIS — K319 Disease of stomach and duodenum, unspecified: Secondary | ICD-10-CM | POA: Diagnosis present

## 2022-06-21 DIAGNOSIS — Z800 Family history of malignant neoplasm of digestive organs: Secondary | ICD-10-CM

## 2022-06-21 DIAGNOSIS — Z6824 Body mass index (BMI) 24.0-24.9, adult: Secondary | ICD-10-CM

## 2022-06-21 DIAGNOSIS — E8770 Fluid overload, unspecified: Secondary | ICD-10-CM | POA: Diagnosis not present

## 2022-06-21 DIAGNOSIS — N179 Acute kidney failure, unspecified: Secondary | ICD-10-CM | POA: Diagnosis not present

## 2022-06-21 DIAGNOSIS — J9811 Atelectasis: Secondary | ICD-10-CM | POA: Diagnosis present

## 2022-06-21 DIAGNOSIS — R54 Age-related physical debility: Secondary | ICD-10-CM | POA: Diagnosis not present

## 2022-06-21 DIAGNOSIS — J90 Pleural effusion, not elsewhere classified: Secondary | ICD-10-CM | POA: Diagnosis present

## 2022-06-21 DIAGNOSIS — Z87440 Personal history of urinary (tract) infections: Secondary | ICD-10-CM

## 2022-06-21 HISTORY — DX: Glucose-6-phosphate dehydrogenase (G6PD) deficiency without anemia: D75.A

## 2022-06-21 LAB — COMPREHENSIVE METABOLIC PANEL
ALT: 121 U/L — ABNORMAL HIGH (ref 0–44)
AST: 163 U/L — ABNORMAL HIGH (ref 15–41)
Albumin: 3 g/dL — ABNORMAL LOW (ref 3.5–5.0)
Alkaline Phosphatase: 234 U/L — ABNORMAL HIGH (ref 38–126)
Anion gap: 12 (ref 5–15)
BUN: 24 mg/dL — ABNORMAL HIGH (ref 8–23)
CO2: 25 mmol/L (ref 22–32)
Calcium: 8.8 mg/dL — ABNORMAL LOW (ref 8.9–10.3)
Chloride: 101 mmol/L (ref 98–111)
Creatinine, Ser: 0.82 mg/dL (ref 0.61–1.24)
GFR, Estimated: >60 mL/min (ref >60)
Glucose, Bld: 134 mg/dL — ABNORMAL HIGH (ref 70–99)
Potassium: 4.1 mmol/L (ref 3.5–5.1)
Sodium: 138 mmol/L (ref 135–145)
Total Bilirubin: 1.2 mg/dL (ref 0.3–1.2)
Total Protein: 8.7 g/dL — ABNORMAL HIGH (ref 6.5–8.1)

## 2022-06-21 LAB — URINALYSIS, ROUTINE W REFLEX MICROSCOPIC
Glucose, UA: NEGATIVE mg/dL
Hgb urine dipstick: NEGATIVE
Ketones, ur: 40 mg/dL — AB
Leukocytes,Ua: NEGATIVE
Nitrite: POSITIVE — AB
Protein, ur: 100 mg/dL — AB
Specific Gravity, Urine: >=1.03 (ref 1.005–1.030)
pH: 5.5 (ref 5.0–8.0)

## 2022-06-21 LAB — CBC WITH DIFFERENTIAL/PLATELET
Abs Immature Granulocytes: 0.13 10*3/uL — ABNORMAL HIGH (ref 0.00–0.07)
Basophils Absolute: 0.1 10*3/uL (ref 0.0–0.1)
Basophils Relative: 0 %
Eosinophils Absolute: 0.1 10*3/uL (ref 0.0–0.5)
Eosinophils Relative: 0 %
HCT: 40.3 % (ref 39.0–52.0)
Hemoglobin: 13.6 g/dL (ref 13.0–17.0)
Immature Granulocytes: 1 %
Lymphocytes Relative: 3 %
Lymphs Abs: 0.6 10*3/uL — ABNORMAL LOW (ref 0.7–4.0)
MCH: 30.5 pg (ref 26.0–34.0)
MCHC: 33.7 g/dL (ref 30.0–36.0)
MCV: 90.4 fL (ref 80.0–100.0)
Monocytes Absolute: 1 10*3/uL (ref 0.1–1.0)
Monocytes Relative: 4 %
Neutro Abs: 21.1 10*3/uL — ABNORMAL HIGH (ref 1.7–7.7)
Neutrophils Relative %: 92 %
Platelets: 325 10*3/uL (ref 150–400)
RBC: 4.46 MIL/uL (ref 4.22–5.81)
RDW: 12.1 % (ref 11.5–15.5)
WBC: 22.9 10*3/uL — ABNORMAL HIGH (ref 4.0–10.5)
nRBC: 0 % (ref 0.0–0.2)

## 2022-06-21 LAB — URINALYSIS, MICROSCOPIC (REFLEX)

## 2022-06-21 LAB — LIPASE, BLOOD: Lipase: 26 U/L (ref 11–51)

## 2022-06-21 MED ORDER — MORPHINE SULFATE (PF) 4 MG/ML IV SOLN
4.0000 mg | Freq: Once | INTRAVENOUS | Status: AC
  Administered 2022-06-21: 4 mg via INTRAVENOUS
  Filled 2022-06-21: qty 1

## 2022-06-21 MED ORDER — PIPERACILLIN-TAZOBACTAM 3.375 G IVPB
3.3750 g | Freq: Three times a day (TID) | INTRAVENOUS | Status: DC
  Administered 2022-06-22 – 2022-06-27 (×17): 3.375 g via INTRAVENOUS
  Filled 2022-06-21 (×15): qty 50

## 2022-06-21 MED ORDER — DEXTROSE-NACL 5-0.9 % IV SOLN: INTRAVENOUS | Status: DC

## 2022-06-21 MED ORDER — ONDANSETRON HCL 4 MG/2ML IJ SOLN
4.0000 mg | Freq: Once | INTRAMUSCULAR | Status: AC
  Administered 2022-06-21: 4 mg via INTRAVENOUS
  Filled 2022-06-21: qty 2

## 2022-06-21 MED ORDER — SODIUM CHLORIDE 0.9 % IV SOLN: Freq: Once | INTRAVENOUS | Status: AC

## 2022-06-21 MED ORDER — ONDANSETRON HCL 4 MG/2ML IJ SOLN
4.0000 mg | Freq: Four times a day (QID) | INTRAMUSCULAR | Status: DC | PRN
  Administered 2022-06-22 – 2022-07-02 (×15): 4 mg via INTRAVENOUS
  Filled 2022-06-21 (×15): qty 2

## 2022-06-21 MED ORDER — MORPHINE SULFATE (PF) 2 MG/ML IV SOLN
2.0000 mg | INTRAVENOUS | Status: DC | PRN
  Administered 2022-06-22: 2 mg via INTRAVENOUS
  Filled 2022-06-21: qty 1

## 2022-06-21 MED ORDER — SODIUM CHLORIDE 0.9 % IV BOLUS
1000.0000 mL | Freq: Once | INTRAVENOUS | Status: AC
  Administered 2022-06-21: 1000 mL via INTRAVENOUS

## 2022-06-21 MED ORDER — ENOXAPARIN SODIUM 40 MG/0.4ML IJ SOSY
40.0000 mg | PREFILLED_SYRINGE | INTRAMUSCULAR | Status: DC
  Administered 2022-06-22 – 2022-06-24 (×3): 40 mg via SUBCUTANEOUS
  Filled 2022-06-21 (×3): qty 0.4

## 2022-06-21 MED ORDER — ONDANSETRON HCL 4 MG PO TABS: 4.0000 mg | ORAL_TABLET | Freq: Four times a day (QID) | ORAL | Status: DC | PRN

## 2022-06-21 MED ORDER — PIPERACILLIN-TAZOBACTAM 3.375 G IVPB 30 MIN
3.3750 g | Freq: Once | INTRAVENOUS | Status: AC
  Administered 2022-06-21: 3.375 g via INTRAVENOUS
  Filled 2022-06-21: qty 50

## 2022-06-21 NOTE — Progress Notes (Addendum)
Plan of Care Note for accepted transfer   Patient: Cameron Rice MRN: [id number]   [place]: 06/23/2022  Facility requesting transfer: [facility] Requesting Provider: Ronny Bacon  Reason for transfer: SBO with possible metastatic abdominal disease, requiring g admission Facility course: IV Zosyn and morphine as well as Zofran and IV fluids were given.  Cameron Rice is a 71 y.o. male who presents to the hospital complaining of of generalized abdominal pain, abdominal distention, nausea, and vomiting for the past 2 days.  He states he has been unable to have a bowel movement for the past 13 days without taking laxatives.  He states that he had hernia surgery for an umbilical hernia on November 2.  He denies using any opiates at that time.  Denies alcohol use.  Endorses taking Tylenol as prescribed by surgery and the laxatives.  The patient initially denies shortness of breath but endorses later during the encounter.  He also endorses mild dysuria and states he has a history of complicated UTIs.  He denies chest pain, headache.  Past medical history significant for complicated UTIs, recent umbilical hernia surgery, hypertension, colonic polyps.  Labs revealed a BUN of 24 and elevated LFTs with alk phos of 234, albumin 3, AST is 163 and ALT 121.  CBC showed significant leukocytosis of 22.9 with neutrophilia UA showed high specific gravity 100 protein with positive nitrite.  Abdominal and pelvic CT scan showed the following: IMPRESSION: 1. Extensive high density ascites with infiltration of the omentum and mesentery, with some potential higher density and nodular elements raising the possibility of peritoneal spread of malignancy. Hemoperitoneum with widespread inflammation is a differential diagnostic consideration. 2. Mildly dilated loops of small bowel in the left lower quadrant with scattered air-fluid levels, nonspecific for ileus versus early obstruction. 3. Small left and trace right  pleural effusions with bibasilar atelectasis. 4. Small type 1 hiatal hernia. 5. Coronary atherosclerosis. 6. Lower lumbar spondylosis and degenerative disc disease causing impingement bilaterally at L4-5 and L5-S1, and a transitional S1 vertebra. 7. Aortic Atherosclerosis.   Plan of care: -The patient is accepted for admission to [place]  unit, at [facility]..  -He will need general surgery and oncology consults.  The patient will remain under the care and responsibility of the ED provider until he arrives to [facility] hospital.  Author: Christel Mormon, MD 06/29/2022  Check [email or web address] for on-call coverage.  Nursing staff, Please call [place] number on Amion as soon as patient's arrival, so appropriate admitting provider can evaluate the pt.

## 2022-06-21 NOTE — ED Notes (Signed)
SAT 88% on RA. Placed on 2L. MD aware

## 2022-06-21 NOTE — H&P (Signed)
History and Physical    Patient: Cameron Rice FAO:[id number] DOB: July 14, 1951 DOA: 06/29/2022 DOS: the patient was seen and examined on 07/01/2022 PCP: Flossie Buffy, NP  Patient coming from: Home  Chief Complaint:  Chief Complaint  Patient presents with   Abdominal Pain   Emesis   HPI: Cameron Rice is a 71 y.o. male with medical history significant of essential hypertension, hyperlipidemia, recurrent UTI, BPH, history of urethral surgery who apparently had umbilical hernia repair on November 2.  Since then he has been having abdominal distention and not feeling well.  Patient came to the ER with 2 days of nausea vomiting and abdominal distention with the pain.  He has not had a bowel movement for a while although he had a little today.  Also an episode of vomiting earlier today.  He was seen at [place].  Work-up was initiated.  CT abdomen pelvis showed significant findings of possible metastatic disease with small bowel obstruction.  Patient has had colonoscopy about a year ago that was normal.  His most recent labs in August of this year where normal but now LFTs are markedly elevated.  Alkaline phosphatase also elevated.  Surgery was consulted and patient is being admitted with small bowel obstruction suspected intraperitoneal carcinomatosis.  His pain is down to 6 out of 10.  Pain is worse with any movement.  No hematochezia, no hematemesis and no melena.  Denied any bright red blood per rectum.  Review of Systems: As mentioned in the history of present illness. All other systems reviewed and are negative. Past Medical History:  Diagnosis Date   Hyperlipidemia    Hypertension    UTI (urinary tract infection)    Past Surgical History:  Procedure Laterality Date   URETHRA SURGERY     went in clean make sure urethra is clear   Social History:  reports that he has never smoked. He has never used smokeless tobacco. He reports that he does not drink alcohol  and does not use drugs.  No Known Allergies  Family History  Problem Relation Age of Onset   Cancer Mother    Cancer Brother        liver cancer   Cancer Maternal Aunt        lung    Prior to Admission medications   Medication Sig Start Date End Date Taking? Authorizing Provider  amLODipine-benazepril (LOTREL) 10-20 MG capsule Take 1 capsule by mouth daily. 09/08/21   Nche, Charlene Brooke, NP  atorvastatin (LIPITOR) 40 MG tablet TAKE 1 TABLET DAILY AT 6PM 09/08/21   Nche, Charlene Brooke, NP  cyclobenzaprine (FLEXERIL) 5 MG tablet Take 1 tablet (5 mg total) by mouth at bedtime. 10/10/21   Nche, Charlene Brooke, NP  diazepam (VALIUM) 5 MG tablet Take one tablet by mouth with food one hour prior to procedure. May repeat 30 minutes prior if needed. Patient not taking: Reported on 04/24/2022 03/17/22   Lorine Bears, NP  Multiple Vitamins-Minerals (ONE-A-DAY MENS 50+ ADVANTAGE) TABS  08/08/07   [provider]  oxybutynin (DITROPAN-XL) 5 MG 24 hr tablet Take 5 mg by mouth daily. 04/08/22   [provider]  sildenafil (REVATIO) 20 MG tablet Take 3-5 tablets daily prn 06/07/17   [provider]  tamsulosin (FLOMAX) 0.4 MG CAPS capsule  06/05/17   [provider]    Physical Exam: Vitals:   06/07/2022 1930 06/13/2022 2000 07/04/2022 2100 06/17/2022 2150  BP: (!) 151/105 (!) 133/92 Marland Kitchen)  132/98 (!) 143/98  Pulse: (!) 111 (!) 110 (!) 110 (!) 110  Resp:    19  Temp:    98.2 F (36.8 C)  TempSrc:    Oral  SpO2: 94% 96% 93% 94%  Weight:      Height:       Constitutional: Acutely ill looking in mild distress, , comfortable Eyes: PERRL, lids and conjunctivae normal ENMT: Mucous membranes are moist. Posterior pharynx clear of any exudate or lesions.Normal dentition.  Neck: normal, supple, no masses, no thyromegaly Respiratory: clear to auscultation bilaterally, no wheezing, no crackles. Normal respiratory effort. No accessory muscle use.  Cardiovascular: Sinus tachycardia, no  murmurs / rubs / gallops. No extremity edema. 2+ pedal pulses. No carotid bruits.  Abdomen: Distended, firm, diffusely tender. No hepatosplenomegaly. Bowel sounds positive.  Musculoskeletal: Good range of motion, no joint swelling or tenderness, Skin: no rashes, lesions, ulcers. No induration Neurologic: CN 2-12 grossly intact. Sensation intact, DTR normal. Strength 5/5 in all 4.  Psychiatric: Normal judgment and insight. Alert and oriented x 3.  Anxious mood  Data Reviewed:  Glucose 134, BUN 24 creatinine 0.82.  Calcium 8.8.  Alkaline phosphatase 234 albumin 3.0.  Lipase 26.  AST 163 ALT 121 and total protein 8.7.  White count 20.9 hemoglobin 13.6 and platelet 325.  Urinalysis positive for nitrite ketones and proteins.  WBC 0 with rare bacteria.  CT abdomen pelvis showed extensive high density ascites with infiltration of the omentum and mesentery with some potential high density and nodular elements raising the possibility of peritoneal spread of malignancy.  Also hemoperitoneum with widespread inflammation is a differential diagnostic consideration.  There was small left trace right pleural effusion with basilar atelectasis.  Assessment and Plan:  #1 small bowel obstruction: Based on CT findings.  Suspicion for possible malignant disease present.  More than likely this is following his surgery.  Hemoperitoneum with adhesions could be partly responsible.  Surgery has been consulted.  Patient more than likely will need IR paracentesis to see what that likely cause is.  The fact that this follows recent surgery and patient literally had normal findings in August makes malignancy less likely.  Will defer to surgery however.  #2 BPH: Continue with home regimen.  #3 history of UTI: Urine is not impressive.  Nitrite quite positive.  Empirically on Zosyn.  Continue to monitor  #4 hyperlipidemia: At this point NPO.  Resume statin once cleared.  #5 elevated LFTs: More than likely related to #1 above.   Continue to monitor  #6 essential hypertension: Resume home regimen and continue treatment.     Advance Care Planning:   Code Status: Full Code Full  Consults: General surgery  Family Communication: No family at bedside  Severity of Illness: The appropriate patient status for this patient is INPATIENT. Inpatient status is judged to be reasonable and necessary in order to provide the required intensity of service to ensure the patient's safety. The patient's presenting symptoms, physical exam findings, and initial radiographic and laboratory data in the context of their chronic comorbidities is felt to place them at high risk for further clinical deterioration. Furthermore, it is not anticipated that the patient will be medically stable for discharge from the hospital within 2 midnights of admission.   * I certify that at the point of admission it is my clinical judgment that the patient will require inpatient hospital care spanning beyond 2 midnights from the point of admission due to high intensity of service, high risk for  further deterioration and high frequency of surveillance required.*  AuthorBarbette Merino, MD 06/07/2022 10:12 PM  For on call review [email or web address].

## 2022-06-21 NOTE — ED Provider Notes (Signed)
[facility] EMERGENCY DEPARTMENT Provider Note   CSN: [id number] Arrival date & time: 06/16/2022  1528     History  Chief Complaint  Patient presents with   Abdominal Pain   Emesis    Cameron Rice is a 71 y.o. male.  Patient presents to the hospital complaining of of generalized abdominal pain, abdominal distention, nausea, and vomiting for the past 2 days.  He states he has been unable to have a bowel movement for the past 13 days without taking laxatives.  He states that he had hernia surgery for an umbilical hernia on November 2.  He denies using any opiates at that time.  Denies alcohol use.  Endorses taking Tylenol as prescribed by surgery and the laxatives.  The patient initially denies shortness of breath but endorses later during the encounter.  He also endorses mild dysuria and states he has a history of complicated UTIs.  He denies chest pain, headache.  Past medical history significant for complicated UTIs, recent umbilical hernia surgery, hypertension, colonic polyps  HPI     Home Medications Prior to Admission medications   Medication Sig Start Date End Date Taking? Authorizing Provider  amLODipine-benazepril (LOTREL) 10-20 MG capsule Take 1 capsule by mouth daily. 09/08/21   Nche, Charlene Brooke, NP  atorvastatin (LIPITOR) 40 MG tablet TAKE 1 TABLET DAILY AT 6PM 09/08/21   Nche, Charlene Brooke, NP  cyclobenzaprine (FLEXERIL) 5 MG tablet Take 1 tablet (5 mg total) by mouth at bedtime. 10/10/21   Nche, Charlene Brooke, NP  diazepam (VALIUM) 5 MG tablet Take one tablet by mouth with food one hour prior to procedure. May repeat 30 minutes prior if needed. Patient not taking: Reported on 04/24/2022 03/17/22   Lorine Bears, NP  Multiple Vitamins-Minerals (ONE-A-DAY MENS 50+ ADVANTAGE) TABS  08/08/07   [provider]  oxybutynin (DITROPAN-XL) 5 MG 24 hr tablet Take 5 mg by mouth daily. 04/08/22   [provider]  sildenafil (REVATIO) 20 MG tablet Take 3-5  tablets daily prn 06/07/17   [provider]  tamsulosin (FLOMAX) 0.4 MG CAPS capsule  06/05/17   [provider]      Allergies    Patient has no known allergies.    Review of Systems   Review of Systems  Constitutional:  Negative for fever.  Respiratory:  Positive for shortness of breath.   Cardiovascular:  Negative for chest pain.  Gastrointestinal:  Positive for abdominal distention, abdominal pain, constipation, nausea and vomiting.  Genitourinary:  Positive for dysuria.    Physical Exam Updated Vital Signs BP (!) 150/102   Pulse (!) 110   Temp 98 F (36.7 C)   Resp 18   Ht '5\' 11"'$  (1.803 m)   Wt 78.9 kg   SpO2 95%   BMI 24.27 kg/m  Physical Exam Vitals and nursing note reviewed.  Constitutional:      General: He is not in acute distress.    Appearance: He is well-developed.  HENT:     Head: Normocephalic and atraumatic.  Eyes:     General: No scleral icterus.    Conjunctiva/sclera: Conjunctivae normal.  Cardiovascular:     Rate and Rhythm: Normal rate and regular rhythm.     Heart sounds: No murmur heard. Pulmonary:     Effort: Pulmonary effort is normal. No respiratory distress.     Breath sounds: Normal breath sounds.  Abdominal:     General: Abdomen is protuberant. There is distension.     Palpations:  Abdomen is soft. There is fluid wave.     Tenderness: There is generalized abdominal tenderness.  Musculoskeletal:        General: No swelling.     Cervical back: Neck supple.  Skin:    General: Skin is warm and dry.     Capillary Refill: Capillary refill takes less than 2 seconds.  Neurological:     Mental Status: He is alert.  Psychiatric:        Mood and Affect: Mood normal.     ED Results / Procedures / Treatments   Labs (all labs ordered are listed, but only abnormal results are displayed) Labs Reviewed  COMPREHENSIVE METABOLIC PANEL - Abnormal; Notable for the following components:      Result Value   Glucose, Bld 134 (*)     BUN 24 (*)    Calcium 8.8 (*)    Total Protein 8.7 (*)    Albumin 3.0 (*)    AST 163 (*)    ALT 121 (*)    Alkaline Phosphatase 234 (*)    All other components within normal limits  URINALYSIS, ROUTINE W REFLEX MICROSCOPIC - Abnormal; Notable for the following components:   Color, Urine AMBER (*)    Bilirubin Urine MODERATE (*)    Ketones, ur 40 (*)    Protein, ur 100 (*)    Nitrite POSITIVE (*)    All other components within normal limits  CBC WITH DIFFERENTIAL/PLATELET - Abnormal; Notable for the following components:   WBC 22.9 (*)    Neutro Abs 21.1 (*)    Lymphs Abs 0.6 (*)    Abs Immature Granulocytes 0.13 (*)    All other components within normal limits  URINALYSIS, MICROSCOPIC (REFLEX) - Abnormal; Notable for the following components:   Bacteria, UA RARE (*)    All other components within normal limits  LIPASE, BLOOD    EKG None  Radiology CT ABDOMEN PELVIS WO CONTRAST  Result Date: 06/29/2022 CLINICAL DATA:  Umbilical hernia surgery 06/08/2022. Abdominal pain and distention with nausea vomiting over the last 2 days. EXAM: CT ABDOMEN AND PELVIS WITHOUT CONTRAST TECHNIQUE: Multidetector CT imaging of the abdomen and pelvis was performed following the standard protocol without IV contrast. RADIATION DOSE REDUCTION: This exam was performed according to the departmental dose-optimization program which includes automated exposure control, adjustment of the mA and/or kV according to patient size and/or use of iterative reconstruction technique. COMPARISON:  02/03/2009 FINDINGS: Lower chest: Small left and trace right pleural effusion. Atelectasis in both lower lobes and along the right hemidiaphragm. Left anterior descending and right coronary artery atherosclerosis. Small type 1 hiatal hernia. Hepatobiliary: Unremarkable Pancreas: Unremarkable Spleen: Unremarkable Adrenals/Urinary Tract: No urinary tract calculi. 1.8 cm hypodense lesion in the left mid upper kidney with  internal density of 6 Hounsfield units favoring benign cyst. No further imaging workup of this lesion is indicated. Adrenal glands unremarkable. Stomach/Bowel: Scattered diverticula of the proximal colon. Tubular structure near the cecum on image 49 of series 5 likely represents normal appendix. Mostly nondistended large bowel with a small amount of higher density material probably reflecting oral contrast. Numerous scattered diverticula, difficult to exclude the possibility of diverticulitis given the surrounding mesenteric and omental infiltration as well as ascites. Mildly dilated loops of small bowel up to about 3.1 cm in the left lower quadrant on image 66 of series 2. Scattered air-fluid levels within these loops and in nondilated loops of small bowel, nonspecific for ileus versus early obstruction. Vascular/Lymphatic: Atherosclerosis is present,  including aortoiliac atherosclerotic disease. There is some atheromatous calcification at the origins of the celiac trunk and SMA. Today's exam is performed without IV contrast and does not characterize patency of the mesenteric vessels. Reproductive: Unremarkable Other: Extensive somewhat high density ascites with infiltration of the omentum and mesentery, with some potential higher density and nodular elements raising the possibility of peritoneal spread of malignancy. For example, the higher density nodularity along the sigmoid colon mesentery on image 78 of series 2. Hemoperitoneum with widespread inflammation is a differential diagnostic consideration. Musculoskeletal: No specific complicating feature related to the umbilical hernia repair is directly identified. Lower lumbar spondylosis and degenerative disc disease with impingement bilaterally at L4-5 and L5-S1, and a transitional S1 vertebra. IMPRESSION: 1. Extensive high density ascites with infiltration of the omentum and mesentery, with some potential higher density and nodular elements raising the  possibility of peritoneal spread of malignancy. Hemoperitoneum with widespread inflammation is a differential diagnostic consideration. 2. Mildly dilated loops of small bowel in the left lower quadrant with scattered air-fluid levels, nonspecific for ileus versus early obstruction. 3. Small left and trace right pleural effusions with bibasilar atelectasis. 4. Small type 1 hiatal hernia. 5. Coronary atherosclerosis. 6. Lower lumbar spondylosis and degenerative disc disease causing impingement bilaterally at L4-5 and L5-S1, and a transitional S1 vertebra. Aortic Atherosclerosis (ICD10-I70.0). Electronically Signed   By: Van Clines M.D.   On: 06/14/2022 17:10    Procedures Procedures    Medications Ordered in ED Medications  piperacillin-tazobactam (ZOSYN) IVPB 3.375 g (3.375 g Intravenous New Bag/Given 06/24/2022 1901)    Followed by  piperacillin-tazobactam (ZOSYN) IVPB 3.375 g (has no administration in time range)  sodium chloride 0.9 % bolus 1,000 mL (0 mLs Intravenous Stopped 06/07/2022 1745)  ondansetron (ZOFRAN) injection 4 mg (4 mg Intravenous Given 07/06/2022 1643)  morphine (PF) 4 MG/ML injection 4 mg (4 mg Intravenous Given 07/02/2022 1643)  0.9 %  sodium chloride infusion ( Intravenous New Bag/Given 06/28/2022 1832)    ED Course/ Medical Decision Making/ A&P                           Medical Decision Making Amount and/or Complexity of Data Reviewed Labs: ordered. Radiology: ordered.  Risk Prescription drug management. Decision regarding hospitalization.   This patient presents to the ED for concern of generalized abdominal pain, this involves an extensive number of treatment options, and is a complaint that carries with it a high risk of complications and morbidity.  The differential diagnosis includes cholecystitis, gastroenteritis, colitis, postop infection, liver failure, and others   Co morbidities that complicate the patient evaluation  History of recent laparoscopic  hernia repair   Additional history obtained:  Additional history obtained from family at bedside External records from outside source obtained and reviewed including consult notes from general surgery from October 6 showing no abdominal distention on physical exam   Lab Tests:  I Ordered, and personally interpreted labs.  The pertinent results include: WBC 22.9, AST 163, ALT 121, alkaline phosphatase 234, lipase 26   Imaging Studies ordered:  I ordered imaging studies including CT abdomen pelvis w/o contrast I independently visualized and interpreted imaging which showed  1. Extensive high density ascites with infiltration of the omentum  and mesentery, with some potential higher density and nodular  elements raising the possibility of peritoneal spread of malignancy.  Hemoperitoneum with widespread inflammation is a differential  diagnostic consideration.  2. Mildly dilated loops of small bowel  in the left lower quadrant  with scattered air-fluid levels, nonspecific for ileus versus early  obstruction.  3. Small left and trace right pleural effusions with bibasilar  atelectasis.  4. Small type 1 hiatal hernia.  5. Coronary atherosclerosis.  6. Lower lumbar spondylosis and degenerative disc disease causing  impingement bilaterally at L4-5 and L5-S1, and a transitional S1  vertebra.   I agree with the radiologist interpretation  Consultations Obtained:  I requested consultation with the general surgeon on-call,  and discussed lab and imaging findings as well as pertinent plan - they recommend: Plan to follow once admitted  I requested consultation with the hospitalist. Dr.Mansy agrees to admit the patient.   Problem List / ED Course / Critical interventions / Medication management   I ordered medication including morphine for pain, Zofran for nausea, normal saline for fluid resuscitation, zosyn for possible intra-abdominal infection Reevaluation of the patient after  these medicines showed that the patient improved I have reviewed the patients home medicines and have made adjustments as needed    Test / Admission - Considered:  Patient with elevated liver enzymes and new ascites. CT scan concerning for possible ileus and concerns of possible malignancy. Doubt post-op/operative cause of patient's ascites. Patient denies alcohol use. Denies Tylenol use other than nightly Tylenol PM. Patient needs admission for further evaluation of his new onset ascites/elevated liver enzymes, likely paracentesis.  Patient also needs follow up with surgery.  Admit to hospital        Final Clinical Impression(s) / ED Diagnoses Final diagnoses:  Elevated liver enzymes  Ileus ([place])  Other ascites    Rx / DC Orders ED Discharge Orders     None         Ronny Bacon 06/11/2022 1919    Long, Wonda Olds, MD 06/23/22 0425

## 2022-06-21 NOTE — Progress Notes (Signed)
Pharmacy Antibiotic Note  Nature L Martinique is a 71 y.o. male admitted on 06/26/2022 with  intra-abdominal infection .  Pharmacy has been consulted for Zosyn dosing.  Patient presenting with generalized abdominal pain and nausea/vomiting for 2 days. Had umbilical hernia surgery on 06/08/22. On presentation, patient is afebrile with WBC of 22.9. Team would like to start IV antibiotics.   Plan: Give Zosyn 3.375g IV q8 hours Monitor clinical status, renal function, culture data, and LOT  Height: '5\' 11"'$  (180.3 cm) Weight: 81.6 kg (180 lb) IBW/kg (Calculated) : 75.3  Temp (24hrs), Avg:98 F (36.7 C), Min:98 F (36.7 C), Max:98 F (36.7 C)  Recent Labs  Lab 06/18/2022 1542  WBC 22.9*  CREATININE 0.82    Estimated Creatinine Clearance: 90.6 mL/min (by C-G formula based on SCr of 0.82 mg/dL).    No Known Allergies  Antimicrobials this admission: Zosyn 11/15 >>  Dose adjustments this admission: N/A  Microbiology results: N/A  Thank you for allowing pharmacy to be a part of this patient's care.  Louanne Belton, PharmD, [facility] PGY1 Pharmacy Resident 06/11/2022 6:47 PM

## 2022-06-21 NOTE — ED Notes (Signed)
Messaged provider for BP meds. Pt BP elevated . Pt has not taken BP meds in a few days due to not feeling well.

## 2022-06-21 NOTE — ED Triage Notes (Signed)
Hernia surgery on 11/2. Generalized abdominal pain, distention, n/v x 2 days. Unable to have a BM without taking laxatives.

## 2022-06-21 NOTE — ED Notes (Signed)
Pt transported to CT ?

## 2022-06-22 ENCOUNTER — Inpatient Hospital Stay (HOSPITAL_COMMUNITY): Payer: BC Managed Care – PPO | Admitting: Anesthesiology

## 2022-06-22 ENCOUNTER — Encounter (HOSPITAL_COMMUNITY): Payer: Self-pay | Admitting: Family Medicine

## 2022-06-22 ENCOUNTER — Other Ambulatory Visit: Payer: Self-pay

## 2022-06-22 ENCOUNTER — Encounter (HOSPITAL_COMMUNITY): Admission: EM | Disposition: E | Payer: Self-pay | Source: Home / Self Care | Attending: Internal Medicine

## 2022-06-22 DIAGNOSIS — K56609 Unspecified intestinal obstruction, unspecified as to partial versus complete obstruction: Secondary | ICD-10-CM | POA: Diagnosis not present

## 2022-06-22 HISTORY — PX: LAPAROSCOPY: SHX197

## 2022-06-22 LAB — CBC
HCT: 37.6 % — ABNORMAL LOW (ref 39.0–52.0)
Hemoglobin: 12.2 g/dL — ABNORMAL LOW (ref 13.0–17.0)
MCH: 30.3 pg (ref 26.0–34.0)
MCHC: 32.4 g/dL (ref 30.0–36.0)
MCV: 93.5 fL (ref 80.0–100.0)
Platelets: 383 10*3/uL (ref 150–400)
RBC: 4.02 MIL/uL — ABNORMAL LOW (ref 4.22–5.81)
RDW: 12.4 % (ref 11.5–15.5)
WBC: 18.2 10*3/uL — ABNORMAL HIGH (ref 4.0–10.5)
nRBC: 0 % (ref 0.0–0.2)

## 2022-06-22 LAB — SURGICAL PCR SCREEN
MRSA, PCR: NEGATIVE
Staphylococcus aureus: NEGATIVE

## 2022-06-22 LAB — COMPREHENSIVE METABOLIC PANEL
ALT: 94 U/L — ABNORMAL HIGH (ref 0–44)
AST: 87 U/L — ABNORMAL HIGH (ref 15–41)
Albumin: 2.4 g/dL — ABNORMAL LOW (ref 3.5–5.0)
Alkaline Phosphatase: 173 U/L — ABNORMAL HIGH (ref 38–126)
Anion gap: 7 (ref 5–15)
BUN: 22 mg/dL (ref 8–23)
CO2: 25 mmol/L (ref 22–32)
Calcium: 8.6 mg/dL — ABNORMAL LOW (ref 8.9–10.3)
Chloride: 110 mmol/L (ref 98–111)
Creatinine, Ser: 0.64 mg/dL (ref 0.61–1.24)
GFR, Estimated: >60 mL/min (ref >60)
Glucose, Bld: 137 mg/dL — ABNORMAL HIGH (ref 70–99)
Potassium: 3.9 mmol/L (ref 3.5–5.1)
Sodium: 142 mmol/L (ref 135–145)
Total Bilirubin: 1.1 mg/dL (ref 0.3–1.2)
Total Protein: 7.1 g/dL (ref 6.5–8.1)

## 2022-06-22 LAB — HIV ANTIBODY (ROUTINE TESTING W REFLEX): HIV Screen 4th Generation wRfx: NONREACTIVE

## 2022-06-22 SURGERY — LAPAROSCOPY, DIAGNOSTIC
Anesthesia: General

## 2022-06-22 MED ORDER — SUGAMMADEX SODIUM 200 MG/2ML IV SOLN
INTRAVENOUS | Status: DC | PRN
  Administered 2022-06-22: 250 mg via INTRAVENOUS

## 2022-06-22 MED ORDER — BUPIVACAINE-EPINEPHRINE 0.25% -1:200000 IJ SOLN
INTRAMUSCULAR | Status: DC | PRN
  Administered 2022-06-22: 20 mL

## 2022-06-22 MED ORDER — PHENYLEPHRINE HCL-NACL 20-0.9 MG/250ML-% IV SOLN
INTRAVENOUS | Status: DC | PRN
  Administered 2022-06-22: 50 ug/min via INTRAVENOUS

## 2022-06-22 MED ORDER — SUCCINYLCHOLINE CHLORIDE 200 MG/10ML IV SOSY
PREFILLED_SYRINGE | INTRAVENOUS | Status: DC | PRN
  Administered 2022-06-22: 100 mg via INTRAVENOUS

## 2022-06-22 MED ORDER — PROPOFOL 10 MG/ML IV BOLUS
INTRAVENOUS | Status: DC | PRN
  Administered 2022-06-22: 200 mg via INTRAVENOUS

## 2022-06-22 MED ORDER — FENTANYL CITRATE (PF) 100 MCG/2ML IJ SOLN
INTRAMUSCULAR | Status: AC
  Filled 2022-06-22: qty 2

## 2022-06-22 MED ORDER — PHENOL 1.4 % MT LIQD
1.0000 | OROMUCOSAL | Status: DC | PRN
  Administered 2022-06-22 – 2022-07-04 (×4): 1 via OROMUCOSAL
  Filled 2022-06-22: qty 177

## 2022-06-22 MED ORDER — CHLORHEXIDINE GLUCONATE 0.12 % MT SOLN
15.0000 mL | Freq: Once | OROMUCOSAL | Status: AC
  Administered 2022-06-22: 15 mL via OROMUCOSAL

## 2022-06-22 MED ORDER — FENTANYL CITRATE (PF) 250 MCG/5ML IJ SOLN
INTRAMUSCULAR | Status: AC
  Filled 2022-06-22: qty 5

## 2022-06-22 MED ORDER — 0.9 % SODIUM CHLORIDE (POUR BTL) OPTIME
TOPICAL | Status: DC | PRN
  Administered 2022-06-22: 1000 mL

## 2022-06-22 MED ORDER — FENTANYL CITRATE (PF) 250 MCG/5ML IJ SOLN
INTRAMUSCULAR | Status: DC | PRN
  Administered 2022-06-22 (×5): 50 ug via INTRAVENOUS

## 2022-06-22 MED ORDER — MORPHINE SULFATE (PF) 2 MG/ML IV SOLN
2.0000 mg | INTRAVENOUS | Status: DC | PRN
  Administered 2022-06-23 – 2022-06-26 (×11): 2 mg via INTRAVENOUS
  Administered 2022-06-27 (×3): 4 mg via INTRAVENOUS
  Administered 2022-06-27: 2 mg via INTRAVENOUS
  Administered 2022-06-28: 4 mg via INTRAVENOUS
  Administered 2022-06-28 – 2022-07-01 (×12): 2 mg via INTRAVENOUS
  Filled 2022-06-22 (×3): qty 2
  Filled 2022-06-22: qty 1
  Filled 2022-06-22: qty 2
  Filled 2022-06-22 (×15): qty 1
  Filled 2022-06-22: qty 2
  Filled 2022-06-22 (×8): qty 1

## 2022-06-22 MED ORDER — LACTATED RINGERS IV SOLN: INTRAVENOUS | Status: DC | PRN

## 2022-06-22 MED ORDER — LIDOCAINE 2% (20 MG/ML) 5 ML SYRINGE
INTRAMUSCULAR | Status: DC | PRN
  Administered 2022-06-22: 100 mg via INTRAVENOUS

## 2022-06-22 MED ORDER — METHOCARBAMOL 1000 MG/10ML IJ SOLN
500.0000 mg | Freq: Four times a day (QID) | INTRAVENOUS | Status: DC | PRN
  Administered 2022-06-25: 500 mg via INTRAVENOUS
  Filled 2022-06-22: qty 500

## 2022-06-22 MED ORDER — MIDAZOLAM HCL 2 MG/2ML IJ SOLN
INTRAMUSCULAR | Status: DC | PRN
  Administered 2022-06-22 (×2): 1 mg via INTRAVENOUS

## 2022-06-22 MED ORDER — LACTATED RINGERS IV SOLN: INTRAVENOUS | Status: DC

## 2022-06-22 MED ORDER — MIDAZOLAM HCL 2 MG/2ML IJ SOLN
INTRAMUSCULAR | Status: AC
  Filled 2022-06-22: qty 2

## 2022-06-22 MED ORDER — FENTANYL CITRATE (PF) 100 MCG/2ML IJ SOLN
INTRAMUSCULAR | Status: DC | PRN
  Administered 2022-06-22 (×4): 50 ug via INTRAVENOUS

## 2022-06-22 MED ORDER — ONDANSETRON HCL 4 MG/2ML IJ SOLN
INTRAMUSCULAR | Status: DC | PRN
  Administered 2022-06-22: 4 mg via INTRAVENOUS

## 2022-06-22 MED ORDER — DEXAMETHASONE SODIUM PHOSPHATE 10 MG/ML IJ SOLN
INTRAMUSCULAR | Status: DC | PRN
  Administered 2022-06-22: 10 mg via INTRAVENOUS

## 2022-06-22 MED ORDER — ROCURONIUM BROMIDE 10 MG/ML (PF) SYRINGE
PREFILLED_SYRINGE | INTRAVENOUS | Status: DC | PRN
  Administered 2022-06-22: 40 mg via INTRAVENOUS

## 2022-06-22 MED ORDER — BUPIVACAINE-EPINEPHRINE (PF) 0.25% -1:200000 IJ SOLN
INTRAMUSCULAR | Status: AC
  Filled 2022-06-22: qty 30

## 2022-06-22 SURGICAL SUPPLY — 61 items
ADH SKN CLS APL DERMABOND .7 (GAUZE/BANDAGES/DRESSINGS) ×1
APL PRP STRL LF DISP 70% ISPRP (MISCELLANEOUS) ×1
APPLIER CLIP 5 13 M/L LIGAMAX5 (MISCELLANEOUS)
APPLIER CLIP ROT 10 11.4 M/L (STAPLE)
APR CLP MED LRG 11.4X10 (STAPLE)
APR CLP MED LRG 5 ANG JAW (MISCELLANEOUS)
BAG COUNTER SPONGE SURGICOUNT (BAG) IMPLANT
BAG SPNG CNTER NS LX DISP (BAG)
BLADE EXTENDED COATED 6.5IN (ELECTRODE) IMPLANT
BLADE HEX COATED 2.75 (ELECTRODE) IMPLANT
BLADE SURG SZ10 CARB STEEL (BLADE) IMPLANT
CHLORAPREP W/TINT 26 (MISCELLANEOUS) ×2 IMPLANT
CLIP APPLIE 5 13 M/L LIGAMAX5 (MISCELLANEOUS) IMPLANT
CLIP APPLIE ROT 10 11.4 M/L (STAPLE) IMPLANT
COVER MAYO STAND STRL (DRAPES) ×2 IMPLANT
COVER SURGICAL LIGHT HANDLE (MISCELLANEOUS) ×2 IMPLANT
DERMABOND ADVANCED .7 DNX12 (GAUZE/BANDAGES/DRESSINGS) IMPLANT
DRAPE LAPAROSCOPIC ABDOMINAL (DRAPES) ×2 IMPLANT
DRAPE WARM FLUID 44X44 (DRAPES) IMPLANT
ELECT REM PT RETURN 15FT ADLT (MISCELLANEOUS) ×2 IMPLANT
GAUZE SPONGE 4X4 12PLY STRL (GAUZE/BANDAGES/DRESSINGS) ×2 IMPLANT
GLOVE BIO SURGEON STRL SZ7 (GLOVE) ×2 IMPLANT
GLOVE BIO SURGEON STRL SZ7.5 (GLOVE) ×2 IMPLANT
GLOVE BIOGEL M 7.0 STRL (GLOVE) ×2 IMPLANT
GLOVE BIOGEL PI IND STRL 7.0 (GLOVE) ×2 IMPLANT
GLOVE BIOGEL PI IND STRL 7.5 (GLOVE) ×2 IMPLANT
GOWN STRL REUS W/ TWL XL LVL3 (GOWN DISPOSABLE) ×4 IMPLANT
GOWN STRL REUS W/TWL XL LVL3 (GOWN DISPOSABLE) ×2
HANDLE SUCTION POOLE (INSTRUMENTS) IMPLANT
IRRIG SUCT STRYKERFLOW 2 WTIP (MISCELLANEOUS)
IRRIGATION SUCT STRKRFLW 2 WTP (MISCELLANEOUS) IMPLANT
KIT BASIN OR (CUSTOM PROCEDURE TRAY) ×2 IMPLANT
KIT TURNOVER KIT A (KITS) IMPLANT
NS IRRIG 1000ML POUR BTL (IV SOLUTION) ×2 IMPLANT
PACK GENERAL/GYN (CUSTOM PROCEDURE TRAY) ×2 IMPLANT
SET TUBE SMOKE EVAC HIGH FLOW (TUBING) ×2 IMPLANT
SHEARS HARMONIC ACE PLUS 36CM (ENDOMECHANICALS) IMPLANT
SOL ANTI FOG 6CC (MISCELLANEOUS) ×2 IMPLANT
SPIKE FLUID TRANSFER (MISCELLANEOUS) ×2 IMPLANT
STAPLER VISISTAT 35W (STAPLE) ×2 IMPLANT
STRIP CLOSURE SKIN 1/2X4 (GAUZE/BANDAGES/DRESSINGS) IMPLANT
SUCTION POOLE HANDLE (INSTRUMENTS)
SUT PDS AB 1 TP1 96 (SUTURE) IMPLANT
SUT PROLENE 2 0 KS (SUTURE) IMPLANT
SUT PROLENE 2 0 SH DA (SUTURE) IMPLANT
SUT SILK 2 0 (SUTURE)
SUT SILK 2 0 SH CR/8 (SUTURE) IMPLANT
SUT SILK 2-0 18XBRD TIE 12 (SUTURE) IMPLANT
SUT SILK 3 0 (SUTURE)
SUT SILK 3 0 SH CR/8 (SUTURE) IMPLANT
SUT SILK 3-0 18XBRD TIE 12 (SUTURE) IMPLANT
SYS LAPSCP GELPORT 120MM (MISCELLANEOUS)
SYSTEM LAPSCP GELPORT 120MM (MISCELLANEOUS) IMPLANT
TOWEL OR 17X26 10 PK STRL BLUE (TOWEL DISPOSABLE) ×2 IMPLANT
TRAY FOLEY MTR SLVR 16FR STAT (SET/KITS/TRAYS/PACK) ×2 IMPLANT
TRAY LAPAROSCOPIC (CUSTOM PROCEDURE TRAY) ×2 IMPLANT
TROCAR 11X100 Z THREAD (TROCAR) IMPLANT
TROCAR ADV FIXATION 12X100MM (TROCAR) IMPLANT
TROCAR BALLN 12MMX100 BLUNT (TROCAR) IMPLANT
TROCAR Z-THREAD SLEEVE 11X100 (TROCAR) IMPLANT
YANKAUER SUCT BULB TIP NO VENT (SUCTIONS) IMPLANT

## 2022-06-22 NOTE — Hospital Course (Addendum)
71 y.o. with history of hypertension-on Lotrel 10-20, HLD on Lipitor 40,recurrent UTI BPH history of urethral surgery who apparently had umbilical hernia repair on November 2 and since then he has been having abdominal distention and not feeling well, and having nausea vomiting x2 days and abdominal distention and pain  At [facility]:CT abdomen pelvis showed significant findings of possible metastatic disease with small bowel obstruction.  Patient has had colonoscopy about a year ago that was normal.  His most recent labs in August of this year where normal but now LFTs are markedly elevated.  Alkaline phosphatase also elevated.  Surgery consulted and admitted.  S/P diagnostic laparoscopy 11/6 Dr. Ninfa Linden and biopsy> found to have peritoneal carcinomatosis CT chest/abd 06/24/22-concerning for PE w/ proximal superior lingular artery-with degree of chronicity and asymmetrically expanded appearance of the right common iliac vein with central rounded hyperattenuation extending to the level of the infrarenal IVC which persist on the delayed images suspicious for DVT> placed on heparin gtt after discussing with surgery and also seen by Dr Lindi Adie hem-onc.

## 2022-06-22 NOTE — Transfer of Care (Signed)
Immediate Anesthesia Transfer of Care Note  Patient: Cameron Rice  Procedure(s) Performed: LAPAROSCOPY DIAGNOSTIC  Patient Location: PACU  Anesthesia Type:General  Level of Consciousness: awake  Airway & Oxygen Therapy: Patient Spontanous Breathing and Patient connected to face mask oxygen  Post-op Assessment: Report given to RN and Post -op Vital signs reviewed and stable  Post vital signs: Reviewed and stable  Last Vitals:  Vitals Value Taken Time  BP    Temp    Pulse 98 07/06/2022 1540  Resp 17 06/28/2022 1540  SpO2 93 % 06/30/2022 1540  Vitals shown include unvalidated device data.  Last Pain:  Vitals:   06/26/2022 1132  TempSrc:   PainSc: 4       Patients Stated Pain Goal: 2 (10/40/45 9136)  Complications: No notable events documented.

## 2022-06-22 NOTE — Progress Notes (Signed)
Patient complaining of the feeling of choking with his NGT which is causing him to cough and have hiccups. I assessed his throat to see if the NGT was coiled and doesn't look coiled and I flushed his NGT. Chloraseptic spray was offered. NP Zebedee Iba was notified.

## 2022-06-22 NOTE — Anesthesia Procedure Notes (Signed)
Date/Time: 06/10/2022 3:29 PM  Performed by: Cynda Familia, CRNAOxygen Delivery Method: Simple face mask Placement Confirmation: positive ETCO2 and breath sounds checked- equal and bilateral Dental Injury: Teeth and Oropharynx as per pre-operative assessment

## 2022-06-22 NOTE — Progress Notes (Signed)
  Transition of Care (TOC) Screening Note   Patient Details  Name: Cameron Rice Date of Birth: September 20, 1950   Transition of Care [facility]) CM/SW Contact:    Lennart Pall, LCSW Phone Number: 06/30/2022, 10:15 AM    Transition of Care Department [facility]) has reviewed patient and no TOC needs have been identified at this time. We will continue to monitor patient advancement through interdisciplinary progression rounds. If new patient transition needs arise, please place a TOC consult.

## 2022-06-22 NOTE — Progress Notes (Signed)
Mobility Specialist - Progress Note   06/08/2022 0944  Mobility  Activity Ambulated with assistance in hallway  Level of Assistance Modified independent, requires aide device or extra time  Assistive Device  (IV Pole)  Distance Ambulated (ft) 280 ft  Activity Response Tolerated well  Mobility Referral Yes  $Mobility charge 1 Mobility   Pt received in bed and agreeable to mobility. No complaints during mobility.  Pt to recliner after session with all needs met.     [facility]

## 2022-06-22 NOTE — Anesthesia Procedure Notes (Signed)
Procedure Name: Intubation Date/Time: 07/03/2022 2:22 PM  Performed by: Cynda Familia, CRNAPre-anesthesia Checklist: Patient identified, Emergency Drugs available, Suction available and Patient being monitored Patient Re-evaluated:Patient Re-evaluated prior to induction Oxygen Delivery Method: Circle System Utilized Preoxygenation: Pre-oxygenation with 100% oxygen Induction Type: IV induction, Rapid sequence and Cricoid Pressure applied Laryngoscope Size: Miller and 2 Grade View: Grade I Tube type: Oral Tube size: 7.5 mm Number of attempts: 1 Airway Equipment and Method: Stylet Placement Confirmation: ETT inserted through vocal cords under direct vision, positive ETCO2 and breath sounds checked- equal and bilateral Secured at: 22 cm Tube secured with: Tape Dental Injury: Teeth and Oropharynx as per pre-operative assessment  Comments: IV induction Witman-- intubation AM CRNA atraumatic-- teeth and mouth as preop--chipping present on front teeth remains unchanged- bilat BS

## 2022-06-22 NOTE — Anesthesia Postprocedure Evaluation (Signed)
Anesthesia Post Note  Patient: Cameron Rice  Procedure(s) Performed: LAPAROSCOPY DIAGNOSTIC     Patient location during evaluation: PACU Anesthesia Type: General Level of consciousness: awake and alert Pain management: pain level controlled Vital Signs Assessment: post-procedure vital signs reviewed and stable Respiratory status: spontaneous breathing, nonlabored ventilation and respiratory function stable Cardiovascular status: blood pressure returned to baseline and stable Postop Assessment: no apparent nausea or vomiting Anesthetic complications: no   No notable events documented.  Last Vitals:  Vitals:   06/17/2022 1700 06/25/2022 1800  BP: (!) 140/99 (!) 149/88  Pulse: (!) 103 (!) 108  Resp: 15 18  Temp:  37 C  SpO2: 94% 95%    Last Pain:  Vitals:   06/17/2022 1800  TempSrc: Oral  PainSc:                  Lidia Collum

## 2022-06-22 NOTE — Consult Note (Signed)
Reason for Consult:postop abdominal pain Referring Physician: Dr. Ramesh  Cameron Rice is an 71 y.o. male.  HPI: This is a 71 year old gentleman who underwent an open umbilical hernia pair with mesh by Dr. Rosendo Gros on November 2.  This was done for a 1.5 cm fascial defect and a 4.3 cm round piece of mesh was placed.  He initially did well but over the last 2 days developed abdominal distention with nausea, vomiting, and abdominal pain.  He has not moved his bowels for several days.  Prior to surgery he was doing well and has had no history of abdominal distention.  He was admitted last evening after CT scan showed ascites and possible hemoperitoneum and possible findings worrisome for carcinomatosis.  He reports that his pain is only mild/moderate.  Past Medical History:  Diagnosis Date   Hyperlipidemia    Hypertension    UTI (urinary tract infection)     Past Surgical History:  Procedure Laterality Date   URETHRA SURGERY     went in clean make sure urethra is clear    Family History  Problem Relation Age of Onset   Cancer Mother    Cancer Brother        liver cancer   Cancer Maternal Aunt        lung    Social History:  reports that he has never smoked. He has never used smokeless tobacco. He reports that he does not drink alcohol and does not use drugs.  Allergies: No Known Allergies  Medications: I have reviewed the patient's current medications.  Results for orders placed or performed during the hospital encounter of 06/17/2022 (from the past 48 hour(s))  Lipase, blood     Status: None   Collection Time: 06/08/2022  3:42 PM  Result Value Ref Range   Lipase 26 11 - 51 U/L    Comment: Performed at [facility], [name] [place], [place] [address]  Comprehensive metabolic panel     Status: Abnormal   Collection Time: 06/27/2022  3:42 PM  Result Value Ref Range   Sodium 138 135 - 145 mmol/L   Potassium 4.1 3.5 - 5.1 mmol/L   Chloride 101 98 - 111 mmol/L    CO2 25 22 - 32 mmol/L   Glucose, Bld 134 (H) 70 - 99 mg/dL    Comment: Glucose reference range applies only to samples taken after fasting for at least 8 hours.   BUN 24 (H) 8 - 23 mg/dL   Creatinine, Ser 0.82 0.61 - 1.24 mg/dL   Calcium 8.8 (L) 8.9 - 10.3 mg/dL   Total Protein 8.7 (H) 6.5 - 8.1 g/dL   Albumin 3.0 (L) 3.5 - 5.0 g/dL   AST 163 (H) 15 - 41 U/L   ALT 121 (H) 0 - 44 U/L   Alkaline Phosphatase 234 (H) 38 - 126 U/L   Total Bilirubin 1.2 0.3 - 1.2 mg/dL   GFR, Estimated >60 >60 mL/min    Comment: (NOTE) Calculated using the CKD-EPI Creatinine Equation (2021)    Anion gap 12 5 - 15    Comment: Performed at [facility], [address] [place], [place] [address]  Urinalysis, Routine w reflex microscopic Urine, Clean Catch     Status: Abnormal   Collection Time: 06/14/2022  3:42 PM  Result Value Ref Range   Color, Urine AMBER (A) YELLOW    Comment: BIOCHEMICALS MAY BE AFFECTED BY COLOR   APPearance CLEAR CLEAR  Specific Gravity, Urine >=1.030 1.005 - 1.030   pH 5.5 5.0 - 8.0   Glucose, UA NEGATIVE NEGATIVE mg/dL   Hgb urine dipstick NEGATIVE NEGATIVE   Bilirubin Urine MODERATE (A) NEGATIVE   Ketones, ur 40 (A) NEGATIVE mg/dL   Protein, ur 100 (A) NEGATIVE mg/dL   Nitrite POSITIVE (A) NEGATIVE   Leukocytes,Ua NEGATIVE NEGATIVE    Comment: Performed at [facility], [address] [place], [place] [address]  CBC with Differential     Status: Abnormal   Collection Time: 06/09/2022  3:42 PM  Result Value Ref Range   WBC 22.9 (H) 4.0 - 10.5 K/uL   RBC 4.46 4.22 - 5.81 MIL/uL   Hemoglobin 13.6 13.0 - 17.0 g/dL   HCT 40.3 39.0 - 52.0 %   MCV 90.4 80.0 - 100.0 fL   MCH 30.5 26.0 - 34.0 pg   MCHC 33.7 30.0 - 36.0 g/dL   RDW 12.1 11.5 - 15.5 %   Platelets 325 150 - 400 K/uL   nRBC 0.0 0.0 - 0.2 %   Neutrophils Relative % 92 %   Neutro Abs 21.1 (H) 1.7 - 7.7 K/uL   Lymphocytes Relative 3 %   Lymphs Abs 0.6 (L) 0.7 - 4.0 K/uL   Monocytes Relative 4 %    Monocytes Absolute 1.0 0.1 - 1.0 K/uL   Eosinophils Relative 0 %   Eosinophils Absolute 0.1 0.0 - 0.5 K/uL   Basophils Relative 0 %   Basophils Absolute 0.1 0.0 - 0.1 K/uL   Immature Granulocytes 1 %   Abs Immature Granulocytes 0.13 (H) 0.00 - 0.07 K/uL    Comment: Performed at [facility], [address] [place], [place] [address]  Urinalysis, Microscopic (reflex)     Status: Abnormal   Collection Time: 06/23/2022  3:42 PM  Result Value Ref Range   RBC / HPF 0-5 0 - 5 RBC/hpf   WBC, UA 0-5 0 - 5 WBC/hpf   Bacteria, UA RARE (A) NONE SEEN   Squamous Epithelial / LPF 0-5 0 - 5   Mucus PRESENT     Comment: Performed at [facility], [address] [place], [place] [address]  CBC     Status: Abnormal   Collection Time: 06/29/2022  4:20 AM  Result Value Ref Range   WBC 18.2 (H) 4.0 - 10.5 K/uL   RBC 4.02 (L) 4.22 - 5.81 MIL/uL   Hemoglobin 12.2 (L) 13.0 - 17.0 g/dL   HCT 37.6 (L) 39.0 - 52.0 %   MCV 93.5 80.0 - 100.0 fL   MCH 30.3 26.0 - 34.0 pg   MCHC 32.4 30.0 - 36.0 g/dL   RDW 12.4 11.5 - 15.5 %   Platelets 383 150 - 400 K/uL   nRBC 0.0 0.0 - 0.2 %    Comment: Performed at [facility], [place] [address], [place], [place] [address]  Comprehensive metabolic panel     Status: Abnormal   Collection Time: 06/17/2022  4:20 AM  Result Value Ref Range   Sodium 142 135 - 145 mmol/L   Potassium 3.9 3.5 - 5.1 mmol/L   Chloride 110 98 - 111 mmol/L   CO2 25 22 - 32 mmol/L   Glucose, Bld 137 (H) 70 - 99 mg/dL    Comment: Glucose reference range applies only to samples taken after fasting for at least 8 hours.   BUN 22 8 - 23 mg/dL   Creatinine, Ser 0.64  0.61 - 1.24 mg/dL   Calcium 8.6 (L) 8.9 - 10.3 mg/dL   Total Protein 7.1 6.5 - 8.1 g/dL   Albumin 2.4 (L) 3.5 - 5.0 g/dL   AST 87 (H) 15 - 41 U/L   ALT 94 (H) 0 - 44 U/L   Alkaline Phosphatase 173 (H) 38 - 126 U/L   Total Bilirubin 1.1 0.3 - 1.2 mg/dL   GFR, Estimated >60 >60 mL/min    Comment:  (NOTE) Calculated using the CKD-EPI Creatinine Equation (2021)    Anion gap 7 5 - 15    Comment: Performed at [facility], [place] [address] [place], [place] [address]    CT ABDOMEN PELVIS WO CONTRAST  Result Date: 06/27/2022 CLINICAL DATA:  Umbilical hernia surgery 06/08/2022. Abdominal pain and distention with nausea vomiting over the last 2 days. EXAM: CT ABDOMEN AND PELVIS WITHOUT CONTRAST TECHNIQUE: Multidetector CT imaging of the abdomen and pelvis was performed following the standard protocol without IV contrast. RADIATION DOSE REDUCTION: This exam was performed according to the departmental dose-optimization program which includes automated exposure control, adjustment of the mA and/or kV according to patient size and/or use of iterative reconstruction technique. COMPARISON:  02/03/2009 FINDINGS: Lower chest: Small left and trace right pleural effusion. Atelectasis in both lower lobes and along the right hemidiaphragm. Left anterior descending and right coronary artery atherosclerosis. Small type 1 hiatal hernia. Hepatobiliary: Unremarkable Pancreas: Unremarkable Spleen: Unremarkable Adrenals/Urinary Tract: No urinary tract calculi. 1.8 cm hypodense lesion in the left mid upper kidney with internal density of 6 Hounsfield units favoring benign cyst. No further imaging workup of this lesion is indicated. Adrenal glands unremarkable. Stomach/Bowel: Scattered diverticula of the proximal colon. Tubular structure near the cecum on image 49 of series 5 likely represents normal appendix. Mostly nondistended large bowel with a small amount of higher density material probably reflecting oral contrast. Numerous scattered diverticula, difficult to exclude the possibility of diverticulitis given the surrounding mesenteric and omental infiltration as well as ascites. Mildly dilated loops of small bowel up to about 3.1 cm in the left lower quadrant on image 66 of series 2. Scattered air-fluid  levels within these loops and in nondilated loops of small bowel, nonspecific for ileus versus early obstruction. Vascular/Lymphatic: Atherosclerosis is present, including aortoiliac atherosclerotic disease. There is some atheromatous calcification at the origins of the celiac trunk and SMA. Today's exam is performed without IV contrast and does not characterize patency of the mesenteric vessels. Reproductive: Unremarkable Other: Extensive somewhat high density ascites with infiltration of the omentum and mesentery, with some potential higher density and nodular elements raising the possibility of peritoneal spread of malignancy. For example, the higher density nodularity along the sigmoid colon mesentery on image 78 of series 2. Hemoperitoneum with widespread inflammation is a differential diagnostic consideration. Musculoskeletal: No specific complicating feature related to the umbilical hernia repair is directly identified. Lower lumbar spondylosis and degenerative disc disease with impingement bilaterally at L4-5 and L5-S1, and a transitional S1 vertebra. IMPRESSION: 1. Extensive high density ascites with infiltration of the omentum and mesentery, with some potential higher density and nodular elements raising the possibility of peritoneal spread of malignancy. Hemoperitoneum with widespread inflammation is a differential diagnostic consideration. 2. Mildly dilated loops of small bowel in the left lower quadrant with scattered air-fluid levels, nonspecific for ileus versus early obstruction. 3. Small left and trace right pleural effusions with bibasilar atelectasis. 4. Small type 1 hiatal hernia. 5. Coronary atherosclerosis. 6. Lower lumbar spondylosis and degenerative disc  disease causing impingement bilaterally at L4-5 and L5-S1, and a transitional S1 vertebra. Aortic Atherosclerosis (ICD10-I70.0). Electronically Signed   By: Van Clines M.D.   On: 06/11/2022 17:10    Review of Systems  All other  systems reviewed and are negative.  Blood pressure (!) 133/91, pulse (!) 103, temperature 98.7 F (37.1 C), temperature source Oral, resp. rate 18, height '5\' 11"'$  (1.803 m), weight 78.9 kg, SpO2 93 %. Physical Exam Constitutional:      Appearance: He is well-developed. He is not diaphoretic.  HENT:     Head: Normocephalic and atraumatic.  Eyes:     General: No scleral icterus. Pulmonary:     Effort: Pulmonary effort is normal.     Breath sounds: Normal breath sounds.  Abdominal:     Comments: He has a diffusely distended abdomen.  There is only mild tenderness with no frank peritonitis.  There is not a fluid wave to suggest ascites.  His abdominal incision is well-healed.  Skin:    General: Skin is warm and dry.  Neurological:     General: No focal deficit present.     Mental Status: He is alert and oriented to person, place, and time.  Psychiatric:        Behavior: Behavior normal.     Assessment/Plan: Postoperative abdominal pain, ascites, and hemoperitoneum after umbilical hernia repair with mesh  After reviewing the CT scan, I am worried this is a postoperative complication and not carcinomatosis which could be suggested on CT scan.  I discussed this with the patient and his family.  Regardless of the etiology, I believe he needs a diagnostic laparoscopy with possible exploratory laparotomy.  I discussed the reasons for this with the patient and his family.  I discussed the risk of the procedure which includes but is not limited to bleeding, infection, injury to surrounding structures, the need for conversion to an open procedure, the need for bowel resection, the potential findings of malignancy, cardiopulmonary issues, DVT, etc.  They understand and agree to proceed.  Surgery is scheduled urgently.  Coralie Keens 06/18/2022, 10:39 AM

## 2022-06-22 NOTE — Op Note (Signed)
LAPAROSCOPY DIAGNOSTIC  Procedure Note  Cameron Rice 06/13/2022 - 06/24/2022   Pre-op Diagnosis: post operative abdominal pain with ascites     Post-op Diagnosis: carcinomatosis  Procedure(s): LAPAROSCOPY DIAGNOSTIC BIOPSY OF OMENTAL AND PERITONEAL NODULES ASPIRATION OF ASCITES FOR CYTOLOGY  Surgeon(s): Coralie Keens, MD  Assist: Winfield Rast, MD [facility] Resident  Anesthesia: General  Staff:  Circulator: Reeves Dam, RN Scrub Person: Sanda Klein; Orvilla Fus  Estimated Blood Loss: Minimal               Specimens: sent to path  Indications: This is a 71 year old gentleman who had undergone an umbilical hernia repair with mesh on November 2 by Dr. Rosendo Gros.  He had been doing well but has been slowly developing abdominal distention and then presented with nausea and vomiting.  He underwent a CT scan showing a large amount of ascites and intra-abdominal fluid.  There were findings worrisome for possible carcinomatosis.  Given his recent surgery, a complication from the procedure cannot be ruled out.  The decision was made to proceed to the operating room for diagnostic laparoscopy.  Findings: The patient was found to have what appeared to be diffuse intra-abdominal carcinomatosis with studding of the small and large bowel, omentum, and peritoneum as well as at the level of the diaphragm. We obtained biopsies of the omentum and peritoneal nodules as well as aspirated fluid for cytology.  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 The source of malignancy cannot be easily identified at the time of surgery  Procedure: The patient was brought to operating identifies the correct patient.  He is placed upon the operating table and general anesthesia was induced.  His abdomen was then prepped and draped in the usual sterile fashion.  I made a small incision in the patient's left upper quadrant with a scalpel.  I then used the 5 mm trocar and  Optiview camera to slowly traversed all layers of the abdominal wall and gained entrance into the abdominal cavity.  Insufflation of the abdomen was begun.  Upon inserting the camera the patient had what appeared to be carcinomatosis.  There was one section of omentum stuck to the abdominal wall the area of the mesh.  There was no evidence of a bowel injury.  There was significant ascites.  There were nodules along the omentum, small bowel, large bowel, peritoneum, and an area of the diaphragm.  We placed another 5 mm trocar in the patient's left abdomen in the left lower abdomen and placed an 11 mm trocar.  With the harmonic scalpel I then biopsied several areas of omentum.  We also removed a peritoneal nodule along the falciform ligament and along the abdominal wall of the left lower quadrant.  A frozen section was performed on 1 the nodule showing adenocarcinoma.  We did suction fluid out which was sent for cytology.  All the specimens were sent to pathology.  At this point there was no clear evidence of the primary site of the malignancy.  The decision at this point was made to terminate the procedure.  The status appeared to be achieved.  We had aspirated approximate 2 L of ascites.  All ports were removed under direct vision and the abdomen was deflated.  All incisions were then closed with 4-0 Monocryl sutures.  We did place a 2-0 Vicryl suture at the 11 mm trocar site as well.  All incisions have been anesthetized Marcaine as well.  Dermabond was applied.  The patient  was then extubated in the operating room and taken in stable condition to the recovery room.          Coralie Keens   Date: 06/26/2022  Time: 3:19 PM

## 2022-06-22 NOTE — Progress Notes (Signed)
PROGRESS NOTE Cameron Rice  OFB:[id number] DOB: [birth year] DOA: 06/20/2022 PCP: Flossie Buffy, NP   Brief Narrative/Hospital Course: 71 y.o. with history of hypertension-on Lotrel 10-20, HLD on Lipitor 40,recurrent UTI BPH history of urethral surgery who apparently had umbilical hernia repair on November 2 and since then he has been having abdominal distention and not feeling well, and having nausea vomiting x2 days and abdominal distention and pain  At [facility]:CT abdomen pelvis showed significant findings of possible metastatic disease with small bowel obstruction.  Patient has had colonoscopy about a year ago that was normal.  His most recent labs in August of this year where normal but now LFTs are markedly elevated.  Alkaline phosphatase also elevated.  Surgery consulted and admitted.     Subjective: Seen examined No nausea or vomiting- abdomen feels full and distended and has pain about the same. No chest pain shortness of breath Wife at bedside Some drainage from hernia surgery site at time to time at home and healing No fever or chills   Assessment and Plan: Principal Problem:   SBO (small bowel obstruction) (HCC) Active Problems:   Essential hypertension   Complicated UTI (urinary tract infection)   BPH (benign prostatic hyperplasia)   Pure hypercholesterolemia   LFT elevation   Small bowel obstruction Suspicion for possible malignant disease: CT abdomen shows: extensive high density ascites with infiltration of the omentum and mesentery, with some potential higher density and nodular elements raising the possibility of peritoneal spread of malignancy. Hemoperitoneum with widespread inflammation is a differential diagnostic consideration.  He has diffusely distended with mild tenderness. I have notified surgery service about patient as soon as I saw him this morning about consult from 06/11/2022> informed by surgery service that they are taking the patient to the OR  for diagnostic laparoscopy and possible exploratory laparotomy. Cont ivf, ppi, pain control  Significant leukocytosis: UA with WBC 0-5 nitrate positive. CT abdomen showed right lower chest.  Atelectasis in both lower lobes trace right pleural effusion.  Patient afebrile.  Previous  wbc normal on 03/08/21.  Empirically started on Zosyn. ordered blood cx 11/16.  Transaminitis with elevated ALP AST ALT, normal TB. Monitor.  BPH: Monitor urine output History of UTI: UA unremarkable on empiric antibiotics as above HTN: BP stable meds on hold HLD: Meds on hold  DVT prophylaxis: enoxaparin (LOVENOX) injection 40 mg Start: 06/17/2022 1000 Code Status:   Code Status: Full Code Family Communication: plan of care discussed with patient/wife  at bedside. Patient status is: Inpatient because of SBO Level of care: Med-Surg   Dispo: The patient is from: home            Anticipated disposition: tbd  Mobility Assessment (last 72 hours)     Mobility Assessment     Row Name 06/20/2022 2154           Does patient have an order for bedrest or is patient medically unstable No - Continue assessment       What is the highest level of mobility based on the progressive mobility assessment? Level 6 (Walks independently in room and hall) - Balance while walking in room without assist - Complete                Objective: Vitals last 24 hrs: Vitals:   06/28/2022 2100 06/14/2022 2150 06/19/2022 0547 06/24/2022 1132  BP: (!) 132/98 (!) 143/98 (!) 133/91   Pulse: (!) 110 (!) 110 (!) 103   Resp:  19  18   Temp:  98.2 F (36.8 C) 98.7 F (37.1 C)   TempSrc:  Oral Oral   SpO2: 93% 94% 93%   Weight:    78 kg  Height:    '5\' 11"'$  (1.803 m)   Weight change:   Physical Examination: General exam: alert awake, older than stated age HEENT:Oral mucosa moist, Ear/Nose WNL grossly Respiratory system: bilaterally clear BS, no use of accessory muscle Cardiovascular system: S1 & S2 +, No JVD. Gastrointestinal system:  Abdomen soft, tender generalized with distension, BS absent, umblical area with  wound from surgery on Nov 6 Nervous System:Alert, awake, moving extremities. Extremities: LE edema neg,distal peripheral pulses palpable.  Skin: No rashes,no icterus. MSK: Normal muscle bulk,tone, power  Medications reviewed:  Scheduled Meds:  [MAR Hold] enoxaparin (LOVENOX) injection  40 mg Subcutaneous Q24H  Continuous Infusions:  dextrose 5 % and 0.9% NaCl 125 mL/hr at 06/11/2022 0645   lactated ringers 10 mL/hr at 07/06/2022 1205   [MAR Hold] piperacillin-tazobactam (ZOSYN)  IV 3.375 g (06/10/2022 0908)    Diet Order             Diet NPO time specified  Diet effective now                  Intake/Output Summary (Last 24 hours) at 06/19/2022 1450 Last data filed at 06/25/2022 1000 Gross per 24 hour  Intake 2119.96 ml  Output 0 ml  Net 2119.96 ml  Net IO Since Admission: 2,119.96 mL [06/09/2022 1450]  Wt Readings from Last 3 Encounters:  07/06/2022 78 kg  04/24/22 80 kg  03/09/22 81.8 kg   Unresulted Labs (From admission, onward)     Start     Ordered   06/28/22 0500  Creatinine, serum  (enoxaparin (LOVENOX)    CrCl >/= 30 ml/min)  Weekly,   R     Comments: while on enoxaparin therapy    06/13/2022 2211   06/18/2022 0745  Culture, blood (Routine X 2) w Reflex to ID Panel  BLOOD CULTURE X 2,   R (with TIMED occurrences)      06/27/2022 0744          Data Reviewed: I have personally reviewed following labs and imaging studies CBC: Recent Labs  Lab 06/26/2022 1542 06/25/2022 0420  WBC 22.9* 18.2*  NEUTROABS 21.1*  --   HGB 13.6 12.2*  HCT 40.3 37.6*  MCV 90.4 93.5  PLT 325 258   Basic Metabolic Panel: Recent Labs  Lab 06/14/2022 1542 06/27/2022 0420  NA 138 142  K 4.1 3.9  CL 101 110  CO2 25 25  GLUCOSE 134* 137*  BUN 24* 22  CREATININE 0.82 0.64  CALCIUM 8.8* 8.6*   GFR: Estimated Creatinine Clearance: 92.8 mL/min (by C-G formula based on SCr of 0.64 mg/dL). Liver Function  Tests: Recent Labs  Lab 06/29/2022 1542 06/25/2022 0420  AST 163* 87*  ALT 121* 94*  ALKPHOS 234* 173*  BILITOT 1.2 1.1  PROT 8.7* 7.1  ALBUMIN 3.0* 2.4*   Recent Results (from the past 240 hour(s))  Surgical pcr screen     Status: None   Collection Time: 06/27/2022 10:52 AM   Specimen: Nasal Mucosa; Nasal Swab  Result Value Ref Range Status   MRSA, PCR NEGATIVE NEGATIVE Final   Staphylococcus aureus NEGATIVE NEGATIVE Final    Comment: (NOTE) The Xpert SA Assay (FDA approved for NASAL specimens in patients 49 years of age and older), is one component of a comprehensive surveillance program.  It is not intended to diagnose infection nor to guide or monitor treatment. Performed at [facility], [place] [address], [place], [place] [address]     Antimicrobials: Anti-infectives (From admission, onward)    Start     Dose/Rate Route Frequency Ordered Stop   06/20/2022 0200  [MAR Hold]  piperacillin-tazobactam (ZOSYN) IVPB 3.375 g        (MAR Hold since Thu 06/20/2022 at 1159.Hold Reason: Transfer to a Procedural area)  See Hyperspace for full Linked Orders Report.   3.375 g 12.5 mL/hr over 240 Minutes Intravenous Every 8 hours 06/15/2022 1849     06/19/2022 1900  piperacillin-tazobactam (ZOSYN) IVPB 3.375 g       See Hyperspace for full Linked Orders Report.   3.375 g 100 mL/hr over 30 Minutes Intravenous  Once 06/09/2022 1849 06/08/2022 1940      Culture/Microbiology No results found for: "SDES", "SPECREQUEST", "CULT", "REPTSTATUS"  Other culture-see note  Radiology Studies: CT ABDOMEN PELVIS WO CONTRAST  Result Date: 07/04/2022 CLINICAL DATA:  Umbilical hernia surgery 06/08/2022. Abdominal pain and distention with nausea vomiting over the last 2 days. EXAM: CT ABDOMEN AND PELVIS WITHOUT CONTRAST TECHNIQUE: Multidetector CT imaging of the abdomen and pelvis was performed following the standard protocol without IV contrast. RADIATION DOSE REDUCTION: This exam was performed  according to the departmental dose-optimization program which includes automated exposure control, adjustment of the mA and/or kV according to patient size and/or use of iterative reconstruction technique. COMPARISON:  02/03/2009 FINDINGS: Lower chest: Small left and trace right pleural effusion. Atelectasis in both lower lobes and along the right hemidiaphragm. Left anterior descending and right coronary artery atherosclerosis. Small type 1 hiatal hernia. Hepatobiliary: Unremarkable Pancreas: Unremarkable Spleen: Unremarkable Adrenals/Urinary Tract: No urinary tract calculi. 1.8 cm hypodense lesion in the left mid upper kidney with internal density of 6 Hounsfield units favoring benign cyst. No further imaging workup of this lesion is indicated. Adrenal glands unremarkable. Stomach/Bowel: Scattered diverticula of the proximal colon. Tubular structure near the cecum on image 49 of series 5 likely represents normal appendix. Mostly nondistended large bowel with a small amount of higher density material probably reflecting oral contrast. Numerous scattered diverticula, difficult to exclude the possibility of diverticulitis given the surrounding mesenteric and omental infiltration as well as ascites. Mildly dilated loops of small bowel up to about 3.1 cm in the left lower quadrant on image 66 of series 2. Scattered air-fluid levels within these loops and in nondilated loops of small bowel, nonspecific for ileus versus early obstruction. Vascular/Lymphatic: Atherosclerosis is present, including aortoiliac atherosclerotic disease. There is some atheromatous calcification at the origins of the celiac trunk and SMA. Today's exam is performed without IV contrast and does not characterize patency of the mesenteric vessels. Reproductive: Unremarkable Other: Extensive somewhat high density ascites with infiltration of the omentum and mesentery, with some potential higher density and nodular elements raising the possibility of  peritoneal spread of malignancy. For example, the higher density nodularity along the sigmoid colon mesentery on image 78 of series 2. Hemoperitoneum with widespread inflammation is a differential diagnostic consideration. Musculoskeletal: No specific complicating feature related to the umbilical hernia repair is directly identified. Lower lumbar spondylosis and degenerative disc disease with impingement bilaterally at L4-5 and L5-S1, and a transitional S1 vertebra. IMPRESSION: 1. Extensive high density ascites with infiltration of the omentum and mesentery, with some potential higher density and nodular elements raising the possibility of peritoneal spread of malignancy. Hemoperitoneum with widespread inflammation is a differential diagnostic  consideration. 2. Mildly dilated loops of small bowel in the left lower quadrant with scattered air-fluid levels, nonspecific for ileus versus early obstruction. 3. Small left and trace right pleural effusions with bibasilar atelectasis. 4. Small type 1 hiatal hernia. 5. Coronary atherosclerosis. 6. Lower lumbar spondylosis and degenerative disc disease causing impingement bilaterally at L4-5 and L5-S1, and a transitional S1 vertebra. Aortic Atherosclerosis (ICD10-I70.0). Electronically Signed   By: Van Clines M.D.   On: 06/17/2022 17:10     LOS: 1 day   Antonieta Pert, MD [facility]  06/29/2022, 2:50 PM

## 2022-06-22 NOTE — Anesthesia Preprocedure Evaluation (Addendum)
Anesthesia Evaluation  Patient identified by MRN, date of birth, ID band Patient awake    Reviewed: Allergy & Precautions, NPO status , Patient's Chart, lab work & pertinent test results  Airway Mallampati: II  TM Distance: >3 FB Neck ROM: Full    Dental  (+) Dental Advisory Given   Pulmonary neg pulmonary ROS   Pulmonary exam normal        Cardiovascular hypertension, Pt. on medications Normal cardiovascular exam     Neuro/Psych negative neurological ROS  negative psych ROS   GI/Hepatic negative GI ROS, Neg liver ROS,,,  Endo/Other  negative endocrine ROS    Renal/GU negative Renal ROS     Musculoskeletal negative musculoskeletal ROS (+)    Abdominal   Peds  Hematology  (+) Blood dyscrasia, anemia   Anesthesia Other Findings   Reproductive/Obstetrics                             Anesthesia Physical Anesthesia Plan  ASA: 3 and emergent  Anesthesia Plan: General   Post-op Pain Management: Ofirmev IV (intra-op)* and Toradol IV (intra-op)*   Induction: Intravenous, Rapid sequence and Cricoid pressure planned  PONV Risk Score and Plan: 4 or greater and Ondansetron, Dexamethasone, Midazolam and Treatment may vary due to age or medical condition  Airway Management Planned: Oral ETT  Additional Equipment: None  Intra-op Plan:   Post-operative Plan: Extubation in OR  Informed Consent: I have reviewed the patients History and Physical, chart, labs and discussed the procedure including the risks, benefits and alternatives for the proposed anesthesia with the patient or authorized representative who has indicated his/her understanding and acceptance.     Dental advisory given  Plan Discussed with: CRNA  Anesthesia Plan Comments:        Anesthesia Quick Evaluation

## 2022-06-23 ENCOUNTER — Inpatient Hospital Stay (HOSPITAL_COMMUNITY): Payer: BC Managed Care – PPO

## 2022-06-23 ENCOUNTER — Encounter (HOSPITAL_COMMUNITY): Payer: Self-pay | Admitting: Surgery

## 2022-06-23 DIAGNOSIS — K56609 Unspecified intestinal obstruction, unspecified as to partial versus complete obstruction: Secondary | ICD-10-CM | POA: Diagnosis not present

## 2022-06-23 LAB — COMPREHENSIVE METABOLIC PANEL
ALT: 76 U/L — ABNORMAL HIGH (ref 0–44)
AST: 57 U/L — ABNORMAL HIGH (ref 15–41)
Albumin: 2.3 g/dL — ABNORMAL LOW (ref 3.5–5.0)
Alkaline Phosphatase: 151 U/L — ABNORMAL HIGH (ref 38–126)
Anion gap: 6 (ref 5–15)
BUN: 17 mg/dL (ref 8–23)
CO2: 26 mmol/L (ref 22–32)
Calcium: 8.5 mg/dL — ABNORMAL LOW (ref 8.9–10.3)
Chloride: 110 mmol/L (ref 98–111)
Creatinine, Ser: 0.63 mg/dL (ref 0.61–1.24)
GFR, Estimated: >60 mL/min (ref >60)
Glucose, Bld: 159 mg/dL — ABNORMAL HIGH (ref 70–99)
Potassium: 4 mmol/L (ref 3.5–5.1)
Sodium: 142 mmol/L (ref 135–145)
Total Bilirubin: 0.8 mg/dL (ref 0.3–1.2)
Total Protein: 6.7 g/dL (ref 6.5–8.1)

## 2022-06-23 LAB — CBC WITH DIFFERENTIAL/PLATELET
Abs Immature Granulocytes: 0.18 10*3/uL — ABNORMAL HIGH (ref 0.00–0.07)
Basophils Absolute: 0 10*3/uL (ref 0.0–0.1)
Basophils Relative: 0 %
Eosinophils Absolute: 0 10*3/uL (ref 0.0–0.5)
Eosinophils Relative: 0 %
HCT: 38.6 % — ABNORMAL LOW (ref 39.0–52.0)
Hemoglobin: 12.6 g/dL — ABNORMAL LOW (ref 13.0–17.0)
Immature Granulocytes: 1 %
Lymphocytes Relative: 3 %
Lymphs Abs: 0.7 10*3/uL (ref 0.7–4.0)
MCH: 30.1 pg (ref 26.0–34.0)
MCHC: 32.6 g/dL (ref 30.0–36.0)
MCV: 92.3 fL (ref 80.0–100.0)
Monocytes Absolute: 1.3 10*3/uL — ABNORMAL HIGH (ref 0.1–1.0)
Monocytes Relative: 5 %
Neutro Abs: 22.2 10*3/uL — ABNORMAL HIGH (ref 1.7–7.7)
Neutrophils Relative %: 91 %
Platelets: 483 10*3/uL — ABNORMAL HIGH (ref 150–400)
RBC: 4.18 MIL/uL — ABNORMAL LOW (ref 4.22–5.81)
RDW: 12.6 % (ref 11.5–15.5)
WBC: 24.4 10*3/uL — ABNORMAL HIGH (ref 4.0–10.5)
nRBC: 0 % (ref 0.0–0.2)

## 2022-06-23 MED ORDER — PROCHLORPERAZINE EDISYLATE 10 MG/2ML IJ SOLN
10.0000 mg | Freq: Four times a day (QID) | INTRAMUSCULAR | Status: DC | PRN
  Administered 2022-06-27 – 2022-06-29 (×3): 10 mg via INTRAVENOUS
  Filled 2022-06-23 (×3): qty 2

## 2022-06-23 MED ORDER — METHOCARBAMOL 1000 MG/10ML IJ SOLN
500.0000 mg | Freq: Once | INTRAVENOUS | Status: AC
  Administered 2022-06-23: 500 mg via INTRAVENOUS
  Filled 2022-06-23: qty 500

## 2022-06-23 MED ORDER — PROCHLORPERAZINE EDISYLATE 10 MG/2ML IJ SOLN
10.0000 mg | Freq: Once | INTRAMUSCULAR | Status: AC
  Administered 2022-06-23: 10 mg via INTRAVENOUS
  Filled 2022-06-23: qty 2

## 2022-06-23 NOTE — Progress Notes (Signed)
Mobility Specialist - Progress Note   06/23/22 1014  Mobility  Activity Ambulated with assistance in hallway  Level of Assistance Modified independent, requires aide device or extra time  Assistive Device  (IV Pole)  Distance Ambulated (ft) 500 ft  Activity Response Tolerated well  Mobility Referral Yes  $Mobility charge 1 Mobility   Pt received EOB and agreeable to mobility. No complaints during mobility. Pt to bench after session with all needs met awaiting wife to assist with washing up.     [facility]

## 2022-06-23 NOTE — Progress Notes (Signed)
PROGRESS NOTE Cameron Rice  IZT:[id number] DOB: 06-13-1951 DOA: 07/01/2022 PCP: Flossie Buffy, NP   Brief Narrative/Hospital Course: 71 y.o. with history of hypertension-on Lotrel 10-20, HLD on Lipitor 40,recurrent UTI BPH history of urethral surgery who apparently had umbilical hernia repair on November 2 and since then he has been having abdominal distention and not feeling well, and having nausea vomiting x2 days and abdominal distention and pain  At [facility]:CT abdomen pelvis showed significant findings of possible metastatic disease with small bowel obstruction.  Patient has had colonoscopy about a year ago that was normal.  His most recent labs in August of this year where normal but now LFTs are markedly elevated.  Alkaline phosphatase also elevated.  Surgery consulted and admitted.     Subjective: Seen and examined this morning. Overnight afebrile heart rate 90s to 105 BP stable, on room air Labs with a stable renal function and electrolytes, leukocytosis 24.4 K from 18 K 2 episodes of large emesis around NG tube overnight. X-ray was obtained and an NG tube was repositioned this morning just now  Assessment and Plan: Principal Problem:   SBO (small bowel obstruction) (HCC) Active Problems:   Essential hypertension   Complicated UTI (urinary tract infection)   BPH (benign prostatic hyperplasia)   Pure hypercholesterolemia   LFT elevation  Postop abdominal pain with ascites after umbilical hernia repair with mesh Dr. Rosendo Gros 11/2 Diffuse intra-abdominal carcinomatosis with studding of the small and large bowel omentum and peritoneum as well as at the level of the diaphragm: S/P diagnostic laparoscopy 11/6 Dr. Ninfa Linden and biopsy. Pathology and cytology pending CEA and CA 19-9 pending.  Will need further work-up with CT chest abdomen pelvis-will void after he is able to take oral contrast in 1 to 2 days.  Will need oncology consult during this hospitalization.  Surgery  following closely checking NG tube status with x-ray, continue IV fluids, mobilize, pain control.  Follow-up x-ray after repositioning of NG tube.  Appreciate surgery input.  Significant leukocytosis: UA with WBC 0-5 nitrate positive. CT abdomen showed right lower chest.  Atelectasis in both lower lobes trace right pleural effusion.  Remains afebrile overnight WBC count trending up, on empiric Zosyn, follow-up blood culture from 11/16 Recent Labs  Lab 07/03/2022 1542 07/04/2022 0420 06/23/22 0448  WBC 22.9* 18.2* 24.4*    Transaminitis with elevated ALP AST ALT,normal TB.monitor BPH: Monitor urine output History of UTI: UA unremarkable HTN: BP stable.PO meds on hold HLD: Meds on hold  DVT prophylaxis: enoxaparin (LOVENOX) injection 40 mg Start: 06/20/2022 1000 Code Status:   Code Status: Full Code Family Communication: plan of care discussed with patient/wife  at bedside. Patient status is: Inpatient because of SBO Level of care: Med-Surg   Dispo: The patient is from: home            Anticipated disposition: tbd  Mobility Assessment (last 72 hours)     Mobility Assessment     Row Name 06/11/2022 2105 06/07/2022 2154         Does patient have an order for bedrest or is patient medically unstable No - Continue assessment No - Continue assessment      What is the highest level of mobility based on the progressive mobility assessment? Level 6 (Walks independently in room and hall) - Balance while walking in room without assist - Complete Level 6 (Walks independently in room and hall) - Balance while walking in room without assist - Complete  Objective: Vitals last 24 hrs: Vitals:   07/01/2022 2019 06/09/2022 2129 06/23/22 0136 06/23/22 0546  BP: (!) 139/91 138/88 (!) 132/99 132/85  Pulse: 96 (!) 103 (!) 109 (!) 105  Resp: '17 18 18 18  '$ Temp: 97.8 F (36.6 C) 98.5 F (36.9 C) 97.7 F (36.5 C) 97.7 F (36.5 C)  TempSrc: Oral Oral Oral   SpO2: 99% 94% 96% 96%  Weight:       Height:       Weight change: -3.647 kg  Physical Examination: General exam: AA OX3, weak,older appearing HEENT:Oral mucosa moist, Ear/Nose WNL grossly, dentition normal. Respiratory system: bilaterally clear BS,no use of accessory muscle Cardiovascular system: S1 & S2 +, regular rate. Gastrointestinal system: Abdomen soft, formed distended hypoactive bowel sounds with minimal diffuse tenderness incision clean dry  Nervous System:Alert, awake, moving extremities and grossly nonfocal Extremities: LE ankle edema neg, lower extremities warm Skin: No rashes,no icterus. MSK: Normal muscle bulk,tone, power   Medications reviewed:  Scheduled Meds:  enoxaparin (LOVENOX) injection  40 mg Subcutaneous Q24H  Continuous Infusions:  dextrose 5 % and 0.9% NaCl 125 mL/hr at 06/23/22 0538   methocarbamol (ROBAXIN) IV     piperacillin-tazobactam (ZOSYN)  IV 3.375 g (06/23/22 0143)    Diet Order             Diet NPO time specified Except for: Ice Chips  Diet effective now                  Intake/Output Summary (Last 24 hours) at 06/23/2022 0825 Last data filed at 06/23/2022 0600 Gross per 24 hour  Intake 3188.65 ml  Output 1875 ml  Net 1313.65 ml   Net IO Since Admission: 3,275.94 mL [06/23/22 0825]  Wt Readings from Last 3 Encounters:  07/04/2022 78 kg  04/24/22 80 kg  03/09/22 81.8 kg   Unresulted Labs (From admission, onward)     Start     Ordered   06/28/22 0500  Creatinine, serum  (enoxaparin (LOVENOX)    CrCl >/= 30 ml/min)  Weekly,   R     Comments: while on enoxaparin therapy    06/12/2022 2211   06/24/22 0500  CBC  Tomorrow morning,   R        06/23/22 0754   06/24/22 2426  Basic metabolic panel  Tomorrow morning,   R        06/23/22 0754   06/24/22 0500  Magnesium  Tomorrow morning,   R        06/23/22 0754   06/23/22 0500  CBC with Differential/Platelet  Daily at 5am,   R      06/18/2022 1455   06/23/22 0500  Comprehensive metabolic panel  Daily at 5am,   R       06/27/2022 1455   06/23/22 0500  CEA  Tomorrow morning,   R        07/06/2022 1738   06/23/22 0500  CBC  Tomorrow morning,   R        06/20/2022 1738   06/23/22 0500  Cancer antigen 19-9  Tomorrow morning,   R        06/23/2022 1735   07/02/2022 0745  Culture, blood (Routine X 2) w Reflex to ID Panel  BLOOD CULTURE X 2,   R      06/16/2022 0744          Data Reviewed: I have personally reviewed following labs and imaging studies CBC: Recent Labs  Lab  06/08/2022 1542 07/04/2022 0420 06/23/22 0448  WBC 22.9* 18.2* 24.4*  NEUTROABS 21.1*  --  22.2*  HGB 13.6 12.2* 12.6*  HCT 40.3 37.6* 38.6*  MCV 90.4 93.5 92.3  PLT 325 383 483*    Basic Metabolic Panel: Recent Labs  Lab 06/24/2022 1542 06/08/2022 0420 06/23/22 0448  NA 138 142 142  K 4.1 3.9 4.0  CL 101 110 110  CO2 '25 25 26  '$ GLUCOSE 134* 137* 159*  BUN 24* 22 17  CREATININE 0.82 0.64 0.63  CALCIUM 8.8* 8.6* 8.5*    GFR: Estimated Creatinine Clearance: 92.8 mL/min (by C-G formula based on SCr of 0.63 mg/dL). Liver Function Tests: Recent Labs  Lab 06/25/2022 1542 06/27/2022 0420 06/23/22 0448  AST 163* 87* 57*  ALT 121* 94* 76*  ALKPHOS 234* 173* 151*  BILITOT 1.2 1.1 0.8  PROT 8.7* 7.1 6.7  ALBUMIN 3.0* 2.4* 2.3*    Recent Results (from the past 240 hour(s))  Surgical pcr screen     Status: None   Collection Time: 07/02/2022 10:52 AM   Specimen: Nasal Mucosa; Nasal Swab  Result Value Ref Range Status   MRSA, PCR NEGATIVE NEGATIVE Final   Staphylococcus aureus NEGATIVE NEGATIVE Final    Comment: (NOTE) The Xpert SA Assay (FDA approved for NASAL specimens in patients 43 years of age and older), is one component of a comprehensive surveillance program. It is not intended to diagnose infection nor to guide or monitor treatment. Performed at [facility], [place] [address] [place], [place] [address]     Antimicrobials: Anti-infectives (From admission, onward)    Start     Dose/Rate Route Frequency  Ordered Stop   07/06/2022 0200  piperacillin-tazobactam (ZOSYN) IVPB 3.375 g       See Hyperspace for full Linked Orders Report.   3.375 g 12.5 mL/hr over 240 Minutes Intravenous Every 8 hours 06/20/2022 1849     06/24/2022 1900  piperacillin-tazobactam (ZOSYN) IVPB 3.375 g       See Hyperspace for full Linked Orders Report.   3.375 g 100 mL/hr over 30 Minutes Intravenous  Once 06/07/2022 1849 06/27/2022 1940      Culture/Microbiology No results found for: "SDES", "SPECREQUEST", "CULT", "REPTSTATUS"  Other culture-see note  Radiology Studies: DG Abd Portable 1V  Result Date: 06/23/2022 CLINICAL DATA:  Nausea and vomiting in a 71 year old male. EXAM: PORTABLE ABDOMEN - 1 VIEW COMPARISON:  CT of the abdomen and pelvis dated June 21, 2022. FINDINGS: External portion of gastric tube projects over the upper abdomen. Distal tip of this tube is not visible on the current abdominal radiograph. There is scattered loops of gas-filled small bowel and scattered areas of gas-filled colon seen throughout the abdomen in a nonspecific pattern. No sign of abnormal calcification. Spinal degenerative changes and degenerative changes about the bilateral hips. No acute regional skeletal findings. IMPRESSION: 1. External portion of gastric tube projects over the upper abdomen. Distal tip of this tube is not visible on the current abdominal radiograph. Consider correlation with chest radiography to determine whether this tube may be in the distal esophagus. Position could not be assessed on the current radiograph. 2. Nonspecific bowel gas pattern favors ileus. Electronically Signed   By: Zetta Bills M.D.   On: 06/23/2022 08:10   CT ABDOMEN PELVIS WO CONTRAST  Result Date: 06/23/2022 CLINICAL DATA:  Umbilical hernia surgery 06/08/2022. Abdominal pain and distention with nausea vomiting over the last 2 days. EXAM: CT ABDOMEN AND PELVIS WITHOUT CONTRAST TECHNIQUE: Multidetector  CT imaging of the abdomen and pelvis was  performed following the standard protocol without IV contrast. RADIATION DOSE REDUCTION: This exam was performed according to the departmental dose-optimization program which includes automated exposure control, adjustment of the mA and/or kV according to patient size and/or use of iterative reconstruction technique. COMPARISON:  02/03/2009 FINDINGS: Lower chest: Small left and trace right pleural effusion. Atelectasis in both lower lobes and along the right hemidiaphragm. Left anterior descending and right coronary artery atherosclerosis. Small type 1 hiatal hernia. Hepatobiliary: Unremarkable Pancreas: Unremarkable Spleen: Unremarkable Adrenals/Urinary Tract: No urinary tract calculi. 1.8 cm hypodense lesion in the left mid upper kidney with internal density of 6 Hounsfield units favoring benign cyst. No further imaging workup of this lesion is indicated. Adrenal glands unremarkable. Stomach/Bowel: Scattered diverticula of the proximal colon. Tubular structure near the cecum on image 49 of series 5 likely represents normal appendix. Mostly nondistended large bowel with a small amount of higher density material probably reflecting oral contrast. Numerous scattered diverticula, difficult to exclude the possibility of diverticulitis given the surrounding mesenteric and omental infiltration as well as ascites. Mildly dilated loops of small bowel up to about 3.1 cm in the left lower quadrant on image 66 of series 2. Scattered air-fluid levels within these loops and in nondilated loops of small bowel, nonspecific for ileus versus early obstruction. Vascular/Lymphatic: Atherosclerosis is present, including aortoiliac atherosclerotic disease. There is some atheromatous calcification at the origins of the celiac trunk and SMA. Today's exam is performed without IV contrast and does not characterize patency of the mesenteric vessels. Reproductive: Unremarkable Other: Extensive somewhat high density ascites with infiltration  of the omentum and mesentery, with some potential higher density and nodular elements raising the possibility of peritoneal spread of malignancy. For example, the higher density nodularity along the sigmoid colon mesentery on image 78 of series 2. Hemoperitoneum with widespread inflammation is a differential diagnostic consideration. Musculoskeletal: No specific complicating feature related to the umbilical hernia repair is directly identified. Lower lumbar spondylosis and degenerative disc disease with impingement bilaterally at L4-5 and L5-S1, and a transitional S1 vertebra. IMPRESSION: 1. Extensive high density ascites with infiltration of the omentum and mesentery, with some potential higher density and nodular elements raising the possibility of peritoneal spread of malignancy. Hemoperitoneum with widespread inflammation is a differential diagnostic consideration. 2. Mildly dilated loops of small bowel in the left lower quadrant with scattered air-fluid levels, nonspecific for ileus versus early obstruction. 3. Small left and trace right pleural effusions with bibasilar atelectasis. 4. Small type 1 hiatal hernia. 5. Coronary atherosclerosis. 6. Lower lumbar spondylosis and degenerative disc disease causing impingement bilaterally at L4-5 and L5-S1, and a transitional S1 vertebra. Aortic Atherosclerosis (ICD10-I70.0). Electronically Signed   By: Van Clines M.D.   On: 06/28/2022 17:10     LOS: 2 days   Antonieta Pert, MD [facility]  06/23/2022, 8:25 AM

## 2022-06-23 NOTE — Progress Notes (Signed)
[facility] Progress Note  1 Day Post-Op  Subjective: CC-  Wife at bedside. States that he had a rough night. Two episodes of large volume emesis around NG tube. No flatus or BM. Did not get to get out of bed yet since surgery but wants to. Abdominal pain is well controlled.  Objective: Vital signs in last 24 hours: Temp:  [97.7 F (36.5 C)-98.6 F (37 C)] 97.7 F (36.5 C) (11/17 0546) Pulse Rate:  [96-109] 105 (11/17 0546) Resp:  [13-18] 18 (11/17 0546) BP: (132-157)/(85-99) 132/85 (11/17 0546) SpO2:  [91 %-99 %] 96 % (11/17 0546) Weight:  [78 kg] 78 kg (11/16 1132) Last BM Date : 07/04/2022  Intake/Output from previous day: 11/16 0701 - 11/17 0700 In: 3188.7 [P.O.:60; I.V.:2902.9; IV Piggyback:225.8] Out: 1875 [Urine:725; Emesis/NG output:1100; Blood:50] Intake/Output this shift: No intake/output data recorded.  PE: Gen:  Alert, NAD Abd: somewhat firm, distended, hypoactive bowel sounds, minimal diffuse tenderness, incisions cdi   Lab Results:  Recent Labs    06/11/2022 0420 06/23/22 0448  WBC 18.2* 24.4*  HGB 12.2* 12.6*  HCT 37.6* 38.6*  PLT 383 483*   BMET Recent Labs    06/15/2022 0420 06/23/22 0448  NA 142 142  K 3.9 4.0  CL 110 110  CO2 25 26  GLUCOSE 137* 159*  BUN 22 17  CREATININE 0.64 0.63  CALCIUM 8.6* 8.5*   PT/INR No results for input(s): "LABPROT", "INR" in the last 72 hours. CMP     Component Value Date/Time   NA 142 06/23/2022 0448   K 4.0 06/23/2022 0448   CL 110 06/23/2022 0448   CO2 26 06/23/2022 0448   GLUCOSE 159 (H) 06/23/2022 0448   BUN 17 06/23/2022 0448   CREATININE 0.63 06/23/2022 0448   CALCIUM 8.5 (L) 06/23/2022 0448   PROT 6.7 06/23/2022 0448   ALBUMIN 2.3 (L) 06/23/2022 0448   AST 57 (H) 06/23/2022 0448   ALT 76 (H) 06/23/2022 0448   ALKPHOS 151 (H) 06/23/2022 0448   BILITOT 0.8 06/23/2022 0448   GFRNONAA >60 06/23/2022 0448   Lipase     Component Value Date/Time   LIPASE 26 06/10/2022 1542        Studies/Results: CT ABDOMEN PELVIS WO CONTRAST  Result Date: 06/15/2022 CLINICAL DATA:  Umbilical hernia surgery 06/08/2022. Abdominal pain and distention with nausea vomiting over the last 2 days. EXAM: CT ABDOMEN AND PELVIS WITHOUT CONTRAST TECHNIQUE: Multidetector CT imaging of the abdomen and pelvis was performed following the standard protocol without IV contrast. RADIATION DOSE REDUCTION: This exam was performed according to the departmental dose-optimization program which includes automated exposure control, adjustment of the mA and/or kV according to patient size and/or use of iterative reconstruction technique. COMPARISON:  02/03/2009 FINDINGS: Lower chest: Small left and trace right pleural effusion. Atelectasis in both lower lobes and along the right hemidiaphragm. Left anterior descending and right coronary artery atherosclerosis. Small type 1 hiatal hernia. Hepatobiliary: Unremarkable Pancreas: Unremarkable Spleen: Unremarkable Adrenals/Urinary Tract: No urinary tract calculi. 1.8 cm hypodense lesion in the left mid upper kidney with internal density of 6 Hounsfield units favoring benign cyst. No further imaging workup of this lesion is indicated. Adrenal glands unremarkable. Stomach/Bowel: Scattered diverticula of the proximal colon. Tubular structure near the cecum on image 49 of series 5 likely represents normal appendix. Mostly nondistended large bowel with a small amount of higher density material probably reflecting oral contrast. Numerous scattered diverticula, difficult to exclude the possibility of diverticulitis given the surrounding mesenteric  and omental infiltration as well as ascites. Mildly dilated loops of small bowel up to about 3.1 cm in the left lower quadrant on image 66 of series 2. Scattered air-fluid levels within these loops and in nondilated loops of small bowel, nonspecific for ileus versus early obstruction. Vascular/Lymphatic: Atherosclerosis is present,  including aortoiliac atherosclerotic disease. There is some atheromatous calcification at the origins of the celiac trunk and SMA. Today's exam is performed without IV contrast and does not characterize patency of the mesenteric vessels. Reproductive: Unremarkable Other: Extensive somewhat high density ascites with infiltration of the omentum and mesentery, with some potential higher density and nodular elements raising the possibility of peritoneal spread of malignancy. For example, the higher density nodularity along the sigmoid colon mesentery on image 78 of series 2. Hemoperitoneum with widespread inflammation is a differential diagnostic consideration. Musculoskeletal: No specific complicating feature related to the umbilical hernia repair is directly identified. Lower lumbar spondylosis and degenerative disc disease with impingement bilaterally at L4-5 and L5-S1, and a transitional S1 vertebra. IMPRESSION: 1. Extensive high density ascites with infiltration of the omentum and mesentery, with some potential higher density and nodular elements raising the possibility of peritoneal spread of malignancy. Hemoperitoneum with widespread inflammation is a differential diagnostic consideration. 2. Mildly dilated loops of small bowel in the left lower quadrant with scattered air-fluid levels, nonspecific for ileus versus early obstruction. 3. Small left and trace right pleural effusions with bibasilar atelectasis. 4. Small type 1 hiatal hernia. 5. Coronary atherosclerosis. 6. Lower lumbar spondylosis and degenerative disc disease causing impingement bilaterally at L4-5 and L5-S1, and a transitional S1 vertebra. Aortic Atherosclerosis (ICD10-I70.0). Electronically Signed   By: Van Clines M.D.   On: 06/13/2022 17:10    Anti-infectives: Anti-infectives (From admission, onward)    Start     Dose/Rate Route Frequency Ordered Stop   06/29/2022 0200  piperacillin-tazobactam (ZOSYN) IVPB 3.375 g       See  Hyperspace for full Linked Orders Report.   3.375 g 12.5 mL/hr over 240 Minutes Intravenous Every 8 hours 06/30/2022 1849     06/17/2022 1900  piperacillin-tazobactam (ZOSYN) IVPB 3.375 g       See Hyperspace for full Linked Orders Report.   3.375 g 100 mL/hr over 30 Minutes Intravenous  Once 06/10/2022 1849 06/08/2022 1940        Assessment/Plan Post operative abdominal pain with ascites after umbilical hernia repair with mesh by Dr. Rosendo Gros 06/08/22  -POD#1 s/p LAPAROSCOPY DIAGNOSTIC, BIOPSY OF OMENTAL AND PERITONEAL NODULES, ASPIRATION OF ASCITES FOR CYTOLOGY 11/16 Dr. Ninfa Linden - intra-op findings: The patient was found to have what appeared to be diffuse intra-abdominal carcinomatosis with studding of the small and large bowel, omentum, and peritoneum as well as at the level of the diaphragm. We obtained biopsies of the omentum and peritoneal nodules as well as aspirated fluid for cytology.  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 The source of malignancy cannot be easily identified at the time of surgery - pathology and cytology pending. CEA and Ca 19-9 pending - Needs further work up to find source including CT chest, abdomen, pelvis; will wait 1-2 days on this so patient can tolerate oral contrast per NG. Will also need oncology consult during hospitalization.  - NG tube is not working well, check xray to assess placement. Keep NPO/NGT to LIWS and await return in bowel function - Mobilize  ID - zosyn 11/15>> FEN - IVF, NPO/NGT to LIWS VTE - lovenox Foley - d/c 11/17 once mobilizing  Transaminitis - trending down HTN HLD BPH Hx UTI     LOS: 2 days    Wellington Hampshire, [facility] 06/23/2022, 7:51 AM Please see Amion for pager number during day hours 7:00am-4:30pm

## 2022-06-24 ENCOUNTER — Inpatient Hospital Stay (HOSPITAL_COMMUNITY): Payer: BC Managed Care – PPO

## 2022-06-24 DIAGNOSIS — K56609 Unspecified intestinal obstruction, unspecified as to partial versus complete obstruction: Secondary | ICD-10-CM | POA: Diagnosis not present

## 2022-06-24 LAB — CBC WITH DIFFERENTIAL/PLATELET
Abs Immature Granulocytes: 0.18 10*3/uL — ABNORMAL HIGH (ref 0.00–0.07)
Basophils Absolute: 0 10*3/uL (ref 0.0–0.1)
Basophils Relative: 0 %
Eosinophils Absolute: 0 10*3/uL (ref 0.0–0.5)
Eosinophils Relative: 0 %
HCT: 36.8 % — ABNORMAL LOW (ref 39.0–52.0)
Hemoglobin: 11.7 g/dL — ABNORMAL LOW (ref 13.0–17.0)
Immature Granulocytes: 1 %
Lymphocytes Relative: 5 %
Lymphs Abs: 1 10*3/uL (ref 0.7–4.0)
MCH: 29.9 pg (ref 26.0–34.0)
MCHC: 31.8 g/dL (ref 30.0–36.0)
MCV: 94.1 fL (ref 80.0–100.0)
Monocytes Absolute: 1.4 10*3/uL — ABNORMAL HIGH (ref 0.1–1.0)
Monocytes Relative: 7 %
Neutro Abs: 18.1 10*3/uL — ABNORMAL HIGH (ref 1.7–7.7)
Neutrophils Relative %: 87 %
Platelets: 468 10*3/uL — ABNORMAL HIGH (ref 150–400)
RBC: 3.91 MIL/uL — ABNORMAL LOW (ref 4.22–5.81)
RDW: 12.8 % (ref 11.5–15.5)
WBC: 20.7 10*3/uL — ABNORMAL HIGH (ref 4.0–10.5)
nRBC: 0 % (ref 0.0–0.2)

## 2022-06-24 LAB — COMPREHENSIVE METABOLIC PANEL
ALT: 73 U/L — ABNORMAL HIGH (ref 0–44)
AST: 71 U/L — ABNORMAL HIGH (ref 15–41)
Albumin: 2 g/dL — ABNORMAL LOW (ref 3.5–5.0)
Alkaline Phosphatase: 136 U/L — ABNORMAL HIGH (ref 38–126)
Anion gap: 4 — ABNORMAL LOW (ref 5–15)
BUN: 13 mg/dL (ref 8–23)
CO2: 28 mmol/L (ref 22–32)
Calcium: 8.1 mg/dL — ABNORMAL LOW (ref 8.9–10.3)
Chloride: 115 mmol/L — ABNORMAL HIGH (ref 98–111)
Creatinine, Ser: 0.77 mg/dL (ref 0.61–1.24)
GFR, Estimated: >60 mL/min (ref >60)
Glucose, Bld: 130 mg/dL — ABNORMAL HIGH (ref 70–99)
Potassium: 3.9 mmol/L (ref 3.5–5.1)
Sodium: 147 mmol/L — ABNORMAL HIGH (ref 135–145)
Total Bilirubin: 0.9 mg/dL (ref 0.3–1.2)
Total Protein: 5.9 g/dL — ABNORMAL LOW (ref 6.5–8.1)

## 2022-06-24 LAB — CANCER ANTIGEN 19-9: CA 19-9: 20 U/mL (ref 0–35)

## 2022-06-24 LAB — MAGNESIUM: Magnesium: 2.2 mg/dL (ref 1.7–2.4)

## 2022-06-24 LAB — CEA: CEA: 2.2 ng/mL (ref 0.0–4.7)

## 2022-06-24 MED ORDER — IOHEXOL 9 MG/ML PO SOLN
ORAL | Status: AC
  Administered 2022-06-24: 500 mL via NASOGASTRIC
  Administered 2022-06-24: 500 mL
  Filled 2022-06-24: qty 1000

## 2022-06-24 MED ORDER — IOHEXOL 300 MG/ML  SOLN
100.0000 mL | Freq: Once | INTRAMUSCULAR | Status: AC | PRN
  Administered 2022-06-24: 100 mL via INTRAVENOUS

## 2022-06-24 MED ORDER — HEPARIN BOLUS VIA INFUSION
2300.0000 [IU] | Freq: Once | INTRAVENOUS | Status: AC
  Administered 2022-06-24: 2300 [IU] via INTRAVENOUS
  Filled 2022-06-24: qty 2300

## 2022-06-24 MED ORDER — DEXTROSE-NACL 5-0.45 % IV SOLN: INTRAVENOUS | Status: DC

## 2022-06-24 MED ORDER — HEPARIN (PORCINE) 25000 UT/250ML-% IV SOLN
1650.0000 [IU]/h | INTRAVENOUS | Status: DC
  Administered 2022-06-24 – 2022-06-25 (×2): 1350 [IU]/h via INTRAVENOUS
  Administered 2022-06-26: 1400 [IU]/h via INTRAVENOUS
  Administered 2022-06-26 (×2): 1350 [IU]/h via INTRAVENOUS
  Administered 2022-06-28: 1600 [IU]/h via INTRAVENOUS
  Administered 2022-06-29 – 2022-06-30 (×3): 1650 [IU]/h via INTRAVENOUS
  Filled 2022-06-24 (×9): qty 250

## 2022-06-24 MED ORDER — MENTHOL 3 MG MT LOZG
1.0000 | LOZENGE | OROMUCOSAL | Status: DC | PRN
  Administered 2022-06-24 – 2022-07-03 (×2): 3 mg via ORAL
  Filled 2022-06-24: qty 9

## 2022-06-24 NOTE — Progress Notes (Signed)
Pharmacy Antibiotic Note  Cameron Rice is a 71 y.o. male admitted on 06/07/2022 with  intra-abdominal infection .  Pharmacy has been consulted for Zosyn dosing.  Patient presenting with generalized abdominal pain and nausea/vomiting for 2 days. Had umbilical hernia surgery on 06/08/22. On presentation, patient is afebrile with WBC of 22.9. Team would like to start IV antibiotics.   Plan: Continue  Zosyn 3.375g IV q8 hours Monitor clinical status, renal function, culture data, and LOT  Height: '5\' 11"'$  (180.3 cm) Weight: 78 kg (171 lb 15.3 oz) IBW/kg (Calculated) : 75.3  Temp (24hrs), Avg:98.4 F (36.9 C), Min:98.3 F (36.8 C), Max:98.6 F (37 C)  Recent Labs  Lab 06/20/2022 1542 06/30/2022 0420 06/23/22 0448 06/24/22 0524  WBC 22.9* 18.2* 24.4* 20.7*  CREATININE 0.82 0.64 0.63 0.77     Estimated Creatinine Clearance: 92.8 mL/min (by C-G formula based on SCr of 0.77 mg/dL).    No Known Allergies   Dosage will likely remain stable at above doisage and need for further dosage adjustment appears unlikely at present.    Will sign off at this time.  Please reconsult if a change in clinical status warrants re-evaluation of dosage.   Royetta Asal, PharmD, BCPS 06/24/2022 1:16 PM

## 2022-06-24 NOTE — Progress Notes (Signed)
Mobility Specialist Cancellation/Refusal Note:  Pt declined mobility at this time. Pt not having a good day. Will check back as schedule permits.     [facility]

## 2022-06-24 NOTE — Progress Notes (Signed)
Called by radiology CT showed " Pulmonary embolus within the proximal superior lingular artery appears eccentric, suggesting a degree of chronicity.Slightly asymmetrically expanded appearance of the right common iliac vein with central rounded hypoattenuation extending to the level of the infrarenal IVC, which persists on the delayed images, suspicious for deep venous thrombosis" Discussed w/ Dr Georgette Dover: ok for anticoagulation- starting heparin drip. Ordered LE Duplex.

## 2022-06-24 NOTE — Progress Notes (Signed)
ANTICOAGULATION CONSULT NOTE - Initial Consult  Pharmacy Consult for heparin Indication:  VTE treatment  No Known Allergies  Patient Measurements: Height: '5\' 11"'$  (180.3 cm) Weight: 78 kg (171 lb 15.3 oz) IBW/kg (Calculated) : 75.3 Heparin Dosing Weight: n/a. Use TBW of 78 kg  Vital Signs: Temp: 98.4 F (36.9 C) (11/18 0543) Temp Source: Oral (11/18 0543) BP: 145/86 (11/18 1345) Pulse Rate: 98 (11/18 1345)  Labs: Recent Labs    06/15/2022 0420 06/23/22 0448 06/24/22 0524  HGB 12.2* 12.6* 11.7*  HCT 37.6* 38.6* 36.8*  PLT 383 483* 468*  CREATININE 0.64 0.63 0.77    Estimated Creatinine Clearance: 92.8 mL/min (by C-G formula based on SCr of 0.77 mg/dL).   Medical History: Past Medical History:  Diagnosis Date   Hyperlipidemia    Hypertension    UTI (urinary tract infection)     Medications: No anticoagulants prior to admission  Assessment:  Pt is a 24 yoM who underwent diagnostic laparoscopy with biopsy of omental and peritoneal nodules on 11/16 (AET 1541). Pt s/p umbilical hernia repair on 06/08/22.   CT Abdomen (11/18): +PE Venous doppler (11/18): Ordered Pharmacy consulted to dose heparin for VTE treatment.   Today, 06/24/22 CBC: Hgb slightly low post-op, Plt elevated SCr WNL & stable Enoxaparin 40 mg subQ daily for DVT prophylaxis. Last dose 11/18 @ 0907  Discussed with MD - ok to bolus.   Goal of Therapy:  Heparin level 0.3-0.7 units/ml Monitor platelets by anticoagulation protocol: Yes   Plan:  Discontinue prophylactic enoxaparin Heparin bolus of 2300 units IV once Initiate heparin infusion at 1350 units/hr Check 6 hour HL HL, CBC daily Monitor for signs of bleeding  Lenis Noon, PharmD 06/24/2022,4:26 PM

## 2022-06-24 NOTE — Progress Notes (Signed)
2 Days Post-Op   Subjective/Chief Complaint: No further vomiting around NG No flatus or BM yet. Pain controlled OOB yesterday - ambulated with assistance   Objective: Vital signs in last 24 hours: Temp:  [97.5 F (36.4 C)-98.6 F (37 C)] 98.4 F (36.9 C) (11/18 0543) Pulse Rate:  [90-101] 98 (11/18 0543) Resp:  [18-20] 20 (11/18 0543) BP: (127-139)/(86-88) 127/86 (11/18 0543) SpO2:  [94 %-96 %] 95 % (11/18 0543) Last BM Date : 06/07/2022  Intake/Output from previous day: 11/17 0701 - 11/18 0700 In: 3115.6 [I.V.:2995.1; IV Piggyback:120.5] Out: 1620 [Urine:650; Emesis/NG output:970] Intake/Output this shift: No intake/output data recorded.  Gen:  Alert, NAD Abd: distended, hypoactive bowel sounds, minimal diffuse tenderness, incisions cdi     Lab Results:  Recent Labs    06/23/22 0448 06/24/22 0524  WBC 24.4* 20.7*  HGB 12.6* 11.7*  HCT 38.6* 36.8*  PLT 483* 468*   BMET Recent Labs    06/23/22 0448 06/24/22 0524  NA 142 147*  K 4.0 3.9  CL 110 115*  CO2 26 28  GLUCOSE 159* 130*  BUN 17 13  CREATININE 0.63 0.77  CALCIUM 8.5* 8.1*   PT/INR No results for input(s): "LABPROT", "INR" in the last 72 hours. ABG No results for input(s): "PHART", "HCO3" in the last 72 hours.  Invalid input(s): "PCO2", "PO2"  Studies/Results: DG Abd Portable 1V  Result Date: 06/23/2022 CLINICAL DATA:  No NG output EXAM: PORTABLE ABDOMEN - 1 VIEW COMPARISON:  06/23/2022 FINDINGS: NG tube is kinked within the stomach. There are several mildly dilated loops of small bowel throughout the abdomen. No gross free intraperitoneal air. No acute bony findings. IMPRESSION: 1. NG tube is kinked within the stomach.  Recommend repositioning. 2. Several mildly dilated loops of small bowel throughout the abdomen, which may reflect ileus versus obstruction. Electronically Signed   By: Davina Poke D.O.   On: 06/23/2022 09:40   DG CHEST PORT 1 VIEW  Result Date: 06/23/2022 CLINICAL DATA:   No NG tube output EXAM: PORTABLE CHEST 1 VIEW COMPARISON:  None Available. FINDINGS: Nasogastric tube extends into the stomach. The tube is kinked 90 degrees at the level of the side port within the stomach. Heart size within normal limits. Streaky bibasilar opacities. No appreciable pleural fluid collection, although the left costophrenic angle was excluded from the field of view. No pneumothorax. IMPRESSION: 1. Nasogastric tube extends into the stomach. The tube is kinked 90 degrees at the level of the side port within the stomach. Recommend repositioning. 2. Streaky bibasilar opacities, likely atelectasis. Electronically Signed   By: Davina Poke D.O.   On: 06/23/2022 09:39   DG Abd Portable 1V  Result Date: 06/23/2022 CLINICAL DATA:  Nausea and vomiting in a 71 year old male. EXAM: PORTABLE ABDOMEN - 1 VIEW COMPARISON:  CT of the abdomen and pelvis dated June 21, 2022. FINDINGS: External portion of gastric tube projects over the upper abdomen. Distal tip of this tube is not visible on the current abdominal radiograph. There is scattered loops of gas-filled small bowel and scattered areas of gas-filled colon seen throughout the abdomen in a nonspecific pattern. No sign of abnormal calcification. Spinal degenerative changes and degenerative changes about the bilateral hips. No acute regional skeletal findings. IMPRESSION: 1. External portion of gastric tube projects over the upper abdomen. Distal tip of this tube is not visible on the current abdominal radiograph. Consider correlation with chest radiography to determine whether this tube may be in the distal esophagus. Position could not be  assessed on the current radiograph. 2. Nonspecific bowel gas pattern favors ileus. Electronically Signed   By: Zetta Bills M.D.   On: 06/23/2022 08:10    Anti-infectives: Anti-infectives (From admission, onward)    Start     Dose/Rate Route Frequency Ordered Stop   06/07/2022 0200  piperacillin-tazobactam  (ZOSYN) IVPB 3.375 g       See Hyperspace for full Linked Orders Report.   3.375 g 12.5 mL/hr over 240 Minutes Intravenous Every 8 hours 06/24/2022 1849     06/28/2022 1900  piperacillin-tazobactam (ZOSYN) IVPB 3.375 g       See Hyperspace for full Linked Orders Report.   3.375 g 100 mL/hr over 30 Minutes Intravenous  Once 06/11/2022 1849 06/16/2022 1940       Assessment/Plan: Post operative abdominal pain with ascites after umbilical hernia repair with mesh by Dr. Rosendo Gros 06/08/22  -POD#2 s/p LAPAROSCOPY DIAGNOSTIC, BIOPSY OF OMENTAL AND PERITONEAL NODULES, ASPIRATION OF ASCITES FOR CYTOLOGY 11/16 Dr. Ninfa Linden - intra-op findings: The patient was found to have what appeared to be diffuse intra-abdominal carcinomatosis with studding of the small and large bowel, omentum, and peritoneum as well as at the level of the diaphragm. We obtained biopsies of the omentum and peritoneal nodules as well as aspirated fluid for cytology.  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 The source of malignancy cannot be easily identified at the time of surgery - pathology and cytology pending. CEA normal/ CA 19-9 normal - Needs further work up to find source including CT chest, abdomen, pelvis; will wait 1-2 days on this so patient can tolerate oral contrast per NG. Will also need oncology consult during hospitalization.  - Keep NPO/NGT to LIWS and await return in bowel function.  Ice chips, hard candy, gum are OK - Mobilize   ID - zosyn 11/15>> FEN - IVF, NPO/NGT to LIWS VTE - lovenox Foley - d/c 11/17 once mobilizing   Transaminitis - trending down HTN HLD BPH Hx UTI   I had a long discussion with the patient and his wife.  They are requesting that he be transferred to [facility] for his cancer treatment.  I certainly think that would be a good idea to refer him there as an outpatient to be considered for HIPEC, but it is doubtful that they would accept a transfer for a post-op ileus.  We will  try to get him through this hospitalization and then he can be seen at the [facility] cancer center for evaluation.  We will begin his staging with CT chest/ abd/ pelvis over the weekend.   LOS: 3 days    Cameron Rice 06/24/2022

## 2022-06-24 NOTE — Progress Notes (Signed)
PROGRESS NOTE Cameron Rice  YIA:[id number] DOB: [birth year] DOA: 06/10/2022 PCP: Flossie Buffy, NP   Brief Narrative/Hospital Course: 71 y.o. with history of hypertension-on Lotrel 10-20, HLD on Lipitor 40,recurrent UTI BPH history of urethral surgery who apparently had umbilical hernia repair on November 2 and since then he has been having abdominal distention and not feeling well, and having nausea vomiting x2 days and abdominal distention and pain  At [facility]:CT abdomen pelvis showed significant findings of possible metastatic disease with small bowel obstruction.  Patient has had colonoscopy about a year ago that was normal.  His most recent labs in August of this year where normal but now LFTs are markedly elevated.  Alkaline phosphatase also elevated.  Surgery consulted and admitted.  S/P diagnostic laparoscopy 11/6 Dr. Ninfa Linden and biopsy> found to have peritoneal carcinomatosis    Subjective: Seen and examined this morning. Overnight afebrile, labs shows sodium/chloride trending up, AST ALT alk phos elevated bilirubin 2.2 leukocytosis improving 20.7 No more nausea vomiting, NG tube on intermittent suction Ambulating with assistance No flatus or bowel movement  Assessment and Plan: Principal Problem:   SBO (small bowel obstruction) (HCC) Active Problems:   Essential hypertension   Complicated UTI (urinary tract infection)   BPH (benign prostatic hyperplasia)   Pure hypercholesterolemia   LFT elevation  Postop abdominal pain with ascites after umbilical hernia repair with mesh Dr. Rosendo Gros 11/2 Diffuse intra-abdominal carcinomatosis with unclear primary source: S/P diagnostic laparoscopy 11/6 Dr. Ninfa Linden and biopsy noted diffuse intra-abdominal carcinomatosis with studding of the small and large bowel omentum and peritoneum as well as at the level of the diaphragm. Pathology and cytology pending, CEA/CA 19-9 normal.Will need further work-up with oncology eval and CT  chest abdomen pelvis> contrasted studies been ordered by surgeon, discussed with surgical team Cont NGT management per surgery> continue on IV fluid hydration, pain control, mobilize.   Significant leukocytosis: UA with WBC 0-5 nitrate positive. CT abdomen showed right lower chest.  Atelectasis in both lower lobes trace right pleural effusion.  Suspect in the setting of 1.  Patient remains afebrile, on empiric Zosyn, WBC downtrending.blood culture from 11/16-NGTD Recent Labs  Lab 07/04/2022 1542 07/04/2022 0420 06/23/22 0448 06/24/22 0524  WBC 22.9* 18.2* 24.4* 20.7*    Transaminitis with elevated ALP AST ALT,normal TB.monitor Hypoalbuminemia: in the setting of 1, unable to augment nutritional status BPH: monitor voiding History of UTI: UA unremarkable HTN: BP controlled, continue to hold meds  HLD: Meds on hold DVT prophylaxis: enoxaparin (LOVENOX) injection 40 mg Start: 07/06/2022 1000 Code Status:   Code Status: Full Code Family Communication: plan of care discussed with patient/wife  at bedside.  Patient status is: Inpatient because of SBO Level of care: Med-Surg  Dispo: The patient is from: home            Anticipated disposition: tbd  Objective: Vitals last 24 hrs: Vitals:   06/23/22 0934 06/23/22 1422 06/23/22 2059 06/24/22 0543  BP: 134/88 131/86 139/86 127/86  Pulse: 99 90 (!) 101 98  Resp: _0 Temp: (!) 97.5 F (36.4 C) 98.6 F (37 C) 98.3 F (36.8 C) 98.4 F (36.9 C)  TempSrc: Oral Oral Oral Oral  SpO2: 94% 96% 95% 95%  Weight:      Height:       Weight change:   Physical Examination: General exam: alert awake, older than stated age HEENT:Oral mucosa moist, Ear/Nose WNL grossly Respiratory system: Bilaterally clear BS, no use of accessory muscle  Cardiovascular system: S1 & S2 +, No JVD. Gastrointestinal system: Abdomen soft, distended less and less firm, hypoactive bowel sounds with minimal mild diffuse tenderness incision is clean dry intact  Nervous  System: Alert, awake, moving extremities, Extremities: LE edema neg,distal peripheral pulses palpable.  Skin: No rashes,no icterus. MSK: Normal muscle bulk,tone, power NGT+  Medications reviewed:  Scheduled Meds:  enoxaparin (LOVENOX) injection  40 mg Subcutaneous Q24H  Continuous Infusions:  dextrose 5 % and 0.45% NaCl     methocarbamol (ROBAXIN) IV     piperacillin-tazobactam (ZOSYN)  IV 3.375 g (06/24/22 0121)    Diet Order             Diet NPO time specified Except for: Ice Chips  Diet effective now                  Intake/Output Summary (Last 24 hours) at 06/24/2022 0743 Last data filed at 06/24/2022 0165 Gross per 24 hour  Intake 3115.56 ml  Output 1620 ml  Net 1495.56 ml  Net IO Since Admission: 4,771.5 mL [06/24/22 0743]  Wt Readings from Last 3 Encounters:  06/14/2022 78 kg  04/24/22 80 kg  03/09/22 81.8 kg   Unresulted Labs (From admission, onward)     Start     Ordered   06/28/22 0500  Creatinine, serum  (enoxaparin (LOVENOX)    CrCl >/= 30 ml/min)  Weekly,   R     Comments: while on enoxaparin therapy    06/10/2022 2211   06/24/22 0500  CBC  Tomorrow morning,   R        06/23/22 0754   06/23/22 0500  CBC with Differential/Platelet  Daily at 5am,   R      06/18/2022 1455   06/23/22 0500  Comprehensive metabolic panel  Daily at 5am,   R      06/29/2022 1455   06/23/22 0500  CBC  Tomorrow morning,   R        06/19/2022 1738          Data Reviewed: I have personally reviewed following labs and imaging studies CBC: Recent Labs  Lab 06/26/2022 1542 06/08/2022 0420 06/23/22 0448 06/24/22 0524  WBC 22.9* 18.2* 24.4* 20.7*  NEUTROABS 21.1*  --  22.2* 18.1*  HGB 13.6 12.2* 12.6* 11.7*  HCT 40.3 37.6* 38.6* 36.8*  MCV 90.4 93.5 92.3 94.1  PLT 325 383 483* 537*   Basic Metabolic Panel: Recent Labs  Lab 06/18/2022 1542 06/09/2022 0420 06/23/22 0448 06/24/22 0524  NA 138 142 142 147*  K 4.1 3.9 4.0 3.9  CL 101 110 110 115*  CO2 _0 GLUCOSE 134*  137* 159* 130*  BUN 24* _1 CREATININE 0.82 0.64 0.63 0.77  CALCIUM 8.8* 8.6* 8.5* 8.1*  MG  --   --   --  2.2   GFR: Estimated Creatinine Clearance: 92.8 mL/min (by C-G formula based on SCr of 0.77 mg/dL). Liver Function Tests: Recent Labs  Lab 06/30/2022 1542 06/16/2022 0420 06/23/22 0448 06/24/22 0524  AST 163* 87* 57* 71*  ALT 121* 94* 76* 73*  ALKPHOS 234* 173* 151* 136*  BILITOT 1.2 1.1 0.8 0.9  PROT 8.7* 7.1 6.7 5.9*  ALBUMIN 3.0* 2.4* 2.3* 2.0*   Recent Results (from the past 240 hour(s))  Culture, blood (Routine X 2) w Reflex to ID Panel     Status: None (Preliminary result)   Collection Time: 07/06/2022  8:20 AM   Specimen: BLOOD  Result Value Ref Range Status   Specimen Description   Final    BLOOD BLOOD RIGHT HAND Performed at [facility] [address] [place], [place] [address]    Special Requests   Final    BOTTLES DRAWN AEROBIC AND ANAEROBIC Blood Culture results may not be optimal due to an inadequate volume of blood received in culture bottles Performed at [facility] [address], [place], [place] [address]    Culture   Final    NO GROWTH 2 DAYS Performed at [facility] [address], [place], [place] [address]    Report Status PENDING  Incomplete  Culture, blood (Routine X 2) w Reflex to ID Panel     Status: None (Preliminary result)   Collection Time: 06/27/2022  8:31 AM   Specimen: BLOOD  Result Value Ref Range Status   Specimen Description   Final    BLOOD BLOOD RIGHT ARM Performed at [facility] [address], [place], [place] [address]    Special Requests   Final    BOTTLES DRAWN AEROBIC AND ANAEROBIC Blood Culture results may not be optimal due to an inadequate volume of blood received in culture bottles Performed at [facility] [address] [place], [place] [address]    Culture   Final    NO GROWTH 2 DAYS Performed at [facility] [address] [place], [place] [address]    Report Status PENDING  Incomplete  Surgical pcr screen     Status: None   Collection Time: 06/09/2022 10:52 AM   Specimen: Nasal Mucosa; Nasal Swab  Result Value Ref Range Status   MRSA, PCR NEGATIVE NEGATIVE Final   Staphylococcus aureus NEGATIVE NEGATIVE Final    Comment: (NOTE) The Xpert SA Assay (FDA approved for NASAL specimens in patients 86 years of age and older), is one component of a comprehensive surveillance program. It is not intended to diagnose infection nor to guide or monitor treatment. Performed at [facility], [place] [address] [place], [place] [address]     Antimicrobials: Anti-infectives (From admission, onward)    Start     Dose/Rate Route Frequency Ordered Stop   06/12/2022 0200  piperacillin-tazobactam (ZOSYN) IVPB 3.375 g       See Hyperspace for full Linked Orders Report.   3.375 g 12.5 mL/hr over 240 Minutes Intravenous Every 8 hours 06/17/2022 1849     06/23/2022 1900  piperacillin-tazobactam (ZOSYN) IVPB 3.375 g       See Hyperspace for full Linked Orders Report.   3.375 g 100 mL/hr over 30 Minutes Intravenous  Once 06/17/2022 1849 06/11/2022 1940      Culture/Microbiology    Component Value Date/Time   SDES  06/27/2022 0831    BLOOD BLOOD RIGHT ARM Performed at [facility], [place] [address], [place], [place] [address]    Manuel Garcia  06/26/2022 0831    BOTTLES DRAWN AEROBIC AND ANAEROBIC Blood Culture results may not be optimal due to an inadequate volume of blood received in culture bottles Performed at [facility], [place] [address], [place],  [address]    CULT  06/23/2022 0831    NO GROWTH 2 DAYS Performed at [facility] [address], [place],  [address]    REPTSTATUS PENDING 06/10/2022 0831    Other culture-see note  Radiology Studies: DG Abd Portable 1V  Result Date: 06/23/2022 CLINICAL DATA:  No NG output EXAM:  PORTABLE ABDOMEN - 1 VIEW COMPARISON:  06/23/2022 FINDINGS: NG tube is kinked within the stomach. There are several mildly dilated loops of small bowel throughout the abdomen. No gross free intraperitoneal air. No acute bony findings. IMPRESSION: 1. NG tube is kinked within the stomach.  Recommend repositioning. 2. Several mildly dilated loops of small bowel throughout the abdomen, which may reflect ileus versus obstruction. Electronically Signed   By: Davina Poke D.O.   On: 06/23/2022 09:40   DG CHEST PORT 1 VIEW  Result Date: 06/23/2022 CLINICAL DATA:  No NG tube output EXAM: PORTABLE CHEST 1 VIEW COMPARISON:  None Available. FINDINGS: Nasogastric tube extends into the stomach. The tube is kinked 90 degrees at the level of the side port within the stomach. Heart size within normal limits. Streaky bibasilar opacities. No appreciable pleural fluid collection, although the left costophrenic angle was excluded from the field of view. No pneumothorax. IMPRESSION: 1. Nasogastric tube extends into the stomach. The tube is kinked 90 degrees at the level of the side port within the stomach. Recommend repositioning. 2. Streaky bibasilar opacities, likely atelectasis. Electronically Signed   By: Davina Poke D.O.   On: 06/23/2022 09:39   DG Abd Portable 1V  Result Date: 06/23/2022 CLINICAL DATA:  Nausea and vomiting in a 71 year old male. EXAM: PORTABLE ABDOMEN - 1 VIEW COMPARISON:  CT of the abdomen and pelvis dated June 21, 2022. FINDINGS: External portion of gastric tube projects over the upper abdomen. Distal tip of this tube is not visible on the current abdominal radiograph. There is scattered loops of gas-filled small bowel and scattered areas of gas-filled colon seen throughout the abdomen in a nonspecific pattern. No sign of abnormal calcification. Spinal degenerative changes and degenerative changes about the bilateral hips. No acute regional skeletal findings. IMPRESSION: 1. External portion  of gastric tube projects over the upper abdomen. Distal tip of this tube is not visible on the current abdominal radiograph. Consider correlation with chest radiography to determine whether this tube may be in the distal esophagus. Position could not be assessed on the current radiograph. 2. Nonspecific bowel gas pattern favors ileus. Electronically Signed   By: Zetta Bills M.D.   On: 06/23/2022 08:10     LOS: 3 days   Antonieta Pert, MD [facility]  06/24/2022, 7:43 AM

## 2022-06-25 ENCOUNTER — Inpatient Hospital Stay (HOSPITAL_COMMUNITY): Payer: BC Managed Care – PPO

## 2022-06-25 DIAGNOSIS — R52 Pain, unspecified: Secondary | ICD-10-CM

## 2022-06-25 DIAGNOSIS — K56609 Unspecified intestinal obstruction, unspecified as to partial versus complete obstruction: Secondary | ICD-10-CM | POA: Diagnosis not present

## 2022-06-25 LAB — COMPREHENSIVE METABOLIC PANEL
ALT: 153 U/L — ABNORMAL HIGH (ref 0–44)
AST: 139 U/L — ABNORMAL HIGH (ref 15–41)
Albumin: 2.2 g/dL — ABNORMAL LOW (ref 3.5–5.0)
Alkaline Phosphatase: 165 U/L — ABNORMAL HIGH (ref 38–126)
Anion gap: 5 (ref 5–15)
BUN: 15 mg/dL (ref 8–23)
CO2: 27 mmol/L (ref 22–32)
Calcium: 8.3 mg/dL — ABNORMAL LOW (ref 8.9–10.3)
Chloride: 113 mmol/L — ABNORMAL HIGH (ref 98–111)
Creatinine, Ser: 0.72 mg/dL (ref 0.61–1.24)
GFR, Estimated: >60 mL/min (ref >60)
Glucose, Bld: 126 mg/dL — ABNORMAL HIGH (ref 70–99)
Potassium: 3.6 mmol/L (ref 3.5–5.1)
Sodium: 145 mmol/L (ref 135–145)
Total Bilirubin: 1.2 mg/dL (ref 0.3–1.2)
Total Protein: 6.4 g/dL — ABNORMAL LOW (ref 6.5–8.1)

## 2022-06-25 LAB — HEPARIN LEVEL (UNFRACTIONATED)
Heparin Unfractionated: 0.57 IU/mL (ref 0.30–0.70)
Heparin Unfractionated: 0.45 IU/mL (ref 0.30–0.70)

## 2022-06-25 LAB — CBC WITH DIFFERENTIAL/PLATELET
Abs Immature Granulocytes: 0.11 10*3/uL — ABNORMAL HIGH (ref 0.00–0.07)
Basophils Absolute: 0 10*3/uL (ref 0.0–0.1)
Basophils Relative: 0 %
Eosinophils Absolute: 0 10*3/uL (ref 0.0–0.5)
Eosinophils Relative: 0 %
HCT: 37.1 % — ABNORMAL LOW (ref 39.0–52.0)
Hemoglobin: 12.3 g/dL — ABNORMAL LOW (ref 13.0–17.0)
Immature Granulocytes: 1 %
Lymphocytes Relative: 4 %
Lymphs Abs: 0.9 10*3/uL (ref 0.7–4.0)
MCH: 30.7 pg (ref 26.0–34.0)
MCHC: 33.2 g/dL (ref 30.0–36.0)
MCV: 92.5 fL (ref 80.0–100.0)
Monocytes Absolute: 1.2 10*3/uL — ABNORMAL HIGH (ref 0.1–1.0)
Monocytes Relative: 6 %
Neutro Abs: 17.1 10*3/uL — ABNORMAL HIGH (ref 1.7–7.7)
Neutrophils Relative %: 89 %
Platelets: 507 10*3/uL — ABNORMAL HIGH (ref 150–400)
RBC: 4.01 MIL/uL — ABNORMAL LOW (ref 4.22–5.81)
RDW: 13 % (ref 11.5–15.5)
WBC: 19.3 10*3/uL — ABNORMAL HIGH (ref 4.0–10.5)
nRBC: 0 % (ref 0.0–0.2)

## 2022-06-25 NOTE — Progress Notes (Signed)
3 Days Post-Op   Subjective/Chief Complaint: Patient reports some flatus, small amount of diarrhea CT C/A/P showed chronic PE and right common iliac DVT - now on heparin gtt with no sign of bleeding CT showed evidence of peritoneal carcinomatosis but no obvious sign of primary tumor.  No pulmonary tumor or mets noted.   Objective: Vital signs in last 24 hours: Temp:  [97.4 F (36.3 C)-97.6 F (36.4 C)] 97.4 F (36.3 C) (11/19 0637) Pulse Rate:  [98-104] 104 (11/19 0637) Resp:  [14-18] 14 (11/19 0637) BP: (125-145)/(84-91) 125/84 (11/19 0637) SpO2:  [95 %-96 %] 96 % (11/19 0637) Last BM Date : 06/17/2022  Intake/Output from previous day: 11/18 0701 - 11/19 0700 In: 1568.2 [P.O.:80; I.V.:1186.8; IV Piggyback:301.5] Out: 1800 [Urine:300; Emesis/NG output:1500] Intake/Output this shift: No intake/output data recorded.  Gen:  Alert, NAD Abd: mildly distended, hypoactive bowel sounds, minimal diffuse tenderness, incisions cdi   Lab Results:  Recent Labs    06/24/22 0524 06/25/22 0046  WBC 20.7* 19.3*  HGB 11.7* 12.3*  HCT 36.8* 37.1*  PLT 468* 507*   BMET Recent Labs    06/24/22 0524 06/25/22 0046  NA 147* 145  K 3.9 3.6  CL 115* 113*  CO2 28 27  GLUCOSE 130* 126*  BUN 13 15  CREATININE 0.77 0.72  CALCIUM 8.1* 8.3*   PT/INR No results for input(s): "LABPROT", "INR" in the last 72 hours. ABG No results for input(s): "PHART", "HCO3" in the last 72 hours.  Invalid input(s): "PCO2", "PO2"  Studies/Results: CT CHEST W CONTRAST  Result Date: 06/24/2022 CLINICAL DATA:  Oncologic staging. Patient with peritoneal carcinomatosis with unclear primary source. EXAM: CT CHEST, ABDOMEN, AND PELVIS WITH CONTRAST TECHNIQUE: Multidetector CT imaging of the chest, abdomen and pelvis was performed following the standard protocol during bolus administration of intravenous contrast. RADIATION DOSE REDUCTION: This exam was performed according to the departmental dose-optimization  program which includes automated exposure control, adjustment of the mA and/or kV according to patient size and/or use of iterative reconstruction technique. CONTRAST:  160m OMNIPAQUE IOHEXOL 300 MG/ML  SOLN COMPARISON:  CT abdomen and pelvis dated 06/26/2022, CT abdomen dated 02/03/2009 FINDINGS: CT CHEST FINDINGS Cardiovascular: Normal heart size. No significant pericardial fluid/thickening. Coronary artery calcifications. Great vessels are normal in course and caliber. Filling defect within the proximal superior lingular artery (504:171) appears eccentric along the superior aspect of the vessel. Mediastinum/Nodes: thyroid gland without nodules meeting criteria for imaging follow-up by size. Nonspecific mural thickening of the distal esophagus. No pathologically enlarged axillary, supraclavicular, mediastinal, or hilar lymph nodes. Lungs/Pleura: The central airways are patent. There are secretions within the trachea and bilateral proximal main bronchi. Bibasilar subsegmental atelectasis. Accessory azygous fissure. 2 mm left lower lobe nodule (505:86) is unchanged from 20/10, likely benign. No focal consolidation. No pneumothorax. Similar small left and trace right pleural effusions. Musculoskeletal: No acute or abnormal lytic or blastic osseous lesions. CT ABDOMEN PELVIS FINDINGS Hepatobiliary: No focal hepatic lesions. No intra or extrahepatic biliary ductal dilation. Normal gallbladder. Pancreas: 5 mm hypodensity in the pancreatic neck (5:65) no main ductal dilation. Spleen: Normal in size without focal abnormality. Adrenals/Urinary Tract: No adrenal nodules. No suspicious renal mass, calculi, or hydronephrosis. Left interpolar simple/minimally complicated cyst measuring 1.4 cm. Additional subcentimeter hypodensities, too small to characterize. No focal bladder wall thickening. Stomach/Bowel: Enteric tube terminates in the stomach. Nonspecific mural thickening of the gastric antrum/pylorus, which may be related  to underdistention. Mildly dilated proximal small bowel loops with gradual caliber transition to  the level of the lower abdomen, where there are small bowel loops with irregular mural thickening, for example series 2, image 100, likely serosal implants. normal appendix. Vascular/Lymphatic: Aortic atherosclerosis. Slightly asymmetrically expanded appearance of the right common iliac vein (2:99) with central rounded hypoattenuation extending to the level of the infrarenal IVC, which persists on the delayed images (7:37). 10 mm peripancreatic lymph node (2:60). Reproductive: Mildly enlarged prostate gland. Other: Decreased small volume ascites. Similar irregular thickening and nodularity of the peritoneum and omentum. Trace pneumoperitoneum, likely postsurgical. Musculoskeletal: No acute or abnormal lytic or blastic osseous findings. Diffuse subcutaneous edema. Trace subcutaneous emphysema, likely postsurgical. Small fat-containing left inguinal hernia. IMPRESSION: 1. Pulmonary embolus within the proximal superior lingular artery appears eccentric, suggesting a degree of chronicity. 2. Slightly asymmetrically expanded appearance of the right common iliac vein with central rounded hypoattenuation extending to the level of the infrarenal IVC, which persists on the delayed images, suspicious for deep venous thrombosis. 3. Similar irregular thickening and nodularity of the peritoneum and omentum in keeping with peritoneal carcinomatosis. 4. Mildly dilated proximal small bowel loops with gradual caliber transition to the level of the lower abdomen, where there are small bowel loops with irregular mural thickening, likely serosal implants resulting in partial bowel obstruction/ileus. 5. Nonspecific mural thickening of the distal esophagus and gastric antrum/pylorus, which may be related to underdistention. Consider endoscopic evaluation. 6. A 10 mm peripancreatic lymph node is suspicious for metastasis. 7. Decreased small  volume ascites. 8. Similar small left and trace right pleural effusions. 9. A 5 mm hypodensity in the pancreatic neck, possibly a small side branch IPMN. Recommend attention on follow-up. 10. Coronary artery calcifications. Aortic Atherosclerosis (ICD10-I70.0). Critical Value/emergent results were called by telephone at the time of interpretation on 06/24/2022 at 4:05 pm to provider Antonieta Pert, who verbally acknowledged these results. Electronically Signed   By: Darrin Nipper M.D.   On: 06/24/2022 16:07   CT ABDOMEN PELVIS W CONTRAST  Result Date: 06/24/2022 CLINICAL DATA:  Oncologic staging. Patient with peritoneal carcinomatosis with unclear primary source. EXAM: CT CHEST, ABDOMEN, AND PELVIS WITH CONTRAST TECHNIQUE: Multidetector CT imaging of the chest, abdomen and pelvis was performed following the standard protocol during bolus administration of intravenous contrast. RADIATION DOSE REDUCTION: This exam was performed according to the departmental dose-optimization program which includes automated exposure control, adjustment of the mA and/or kV according to patient size and/or use of iterative reconstruction technique. CONTRAST:  133m OMNIPAQUE IOHEXOL 300 MG/ML  SOLN COMPARISON:  CT abdomen and pelvis dated 06/17/2022, CT abdomen dated 02/03/2009 FINDINGS: CT CHEST FINDINGS Cardiovascular: Normal heart size. No significant pericardial fluid/thickening. Coronary artery calcifications. Great vessels are normal in course and caliber. Filling defect within the proximal superior lingular artery (504:171) appears eccentric along the superior aspect of the vessel. Mediastinum/Nodes: thyroid gland without nodules meeting criteria for imaging follow-up by size. Nonspecific mural thickening of the distal esophagus. No pathologically enlarged axillary, supraclavicular, mediastinal, or hilar lymph nodes. Lungs/Pleura: The central airways are patent. There are secretions within the trachea and bilateral proximal main  bronchi. Bibasilar subsegmental atelectasis. Accessory azygous fissure. 2 mm left lower lobe nodule (505:86) is unchanged from 20/10, likely benign. No focal consolidation. No pneumothorax. Similar small left and trace right pleural effusions. Musculoskeletal: No acute or abnormal lytic or blastic osseous lesions. CT ABDOMEN PELVIS FINDINGS Hepatobiliary: No focal hepatic lesions. No intra or extrahepatic biliary ductal dilation. Normal gallbladder. Pancreas: 5 mm hypodensity in the pancreatic neck (5:65) no main ductal dilation. Spleen: Normal  in size without focal abnormality. Adrenals/Urinary Tract: No adrenal nodules. No suspicious renal mass, calculi, or hydronephrosis. Left interpolar simple/minimally complicated cyst measuring 1.4 cm. Additional subcentimeter hypodensities, too small to characterize. No focal bladder wall thickening. Stomach/Bowel: Enteric tube terminates in the stomach. Nonspecific mural thickening of the gastric antrum/pylorus, which may be related to underdistention. Mildly dilated proximal small bowel loops with gradual caliber transition to the level of the lower abdomen, where there are small bowel loops with irregular mural thickening, for example series 2, image 100, likely serosal implants. normal appendix. Vascular/Lymphatic: Aortic atherosclerosis. Slightly asymmetrically expanded appearance of the right common iliac vein (2:99) with central rounded hypoattenuation extending to the level of the infrarenal IVC, which persists on the delayed images (7:37). 10 mm peripancreatic lymph node (2:60). Reproductive: Mildly enlarged prostate gland. Other: Decreased small volume ascites. Similar irregular thickening and nodularity of the peritoneum and omentum. Trace pneumoperitoneum, likely postsurgical. Musculoskeletal: No acute or abnormal lytic or blastic osseous findings. Diffuse subcutaneous edema. Trace subcutaneous emphysema, likely postsurgical. Small fat-containing left inguinal  hernia. IMPRESSION: 1. Pulmonary embolus within the proximal superior lingular artery appears eccentric, suggesting a degree of chronicity. 2. Slightly asymmetrically expanded appearance of the right common iliac vein with central rounded hypoattenuation extending to the level of the infrarenal IVC, which persists on the delayed images, suspicious for deep venous thrombosis. 3. Similar irregular thickening and nodularity of the peritoneum and omentum in keeping with peritoneal carcinomatosis. 4. Mildly dilated proximal small bowel loops with gradual caliber transition to the level of the lower abdomen, where there are small bowel loops with irregular mural thickening, likely serosal implants resulting in partial bowel obstruction/ileus. 5. Nonspecific mural thickening of the distal esophagus and gastric antrum/pylorus, which may be related to underdistention. Consider endoscopic evaluation. 6. A 10 mm peripancreatic lymph node is suspicious for metastasis. 7. Decreased small volume ascites. 8. Similar small left and trace right pleural effusions. 9. A 5 mm hypodensity in the pancreatic neck, possibly a small side branch IPMN. Recommend attention on follow-up. 10. Coronary artery calcifications. Aortic Atherosclerosis (ICD10-I70.0). Critical Value/emergent results were called by telephone at the time of interpretation on 06/24/2022 at 4:05 pm to provider Antonieta Pert, who verbally acknowledged these results. Electronically Signed   By: Darrin Nipper M.D.   On: 06/24/2022 16:07   DG Abd Portable 1V  Result Date: 06/23/2022 CLINICAL DATA:  No NG output EXAM: PORTABLE ABDOMEN - 1 VIEW COMPARISON:  06/23/2022 FINDINGS: NG tube is kinked within the stomach. There are several mildly dilated loops of small bowel throughout the abdomen. No gross free intraperitoneal air. No acute bony findings. IMPRESSION: 1. NG tube is kinked within the stomach.  Recommend repositioning. 2. Several mildly dilated loops of small bowel  throughout the abdomen, which may reflect ileus versus obstruction. Electronically Signed   By: Davina Poke D.O.   On: 06/23/2022 09:40   DG CHEST PORT 1 VIEW  Result Date: 06/23/2022 CLINICAL DATA:  No NG tube output EXAM: PORTABLE CHEST 1 VIEW COMPARISON:  None Available. FINDINGS: Nasogastric tube extends into the stomach. The tube is kinked 90 degrees at the level of the side port within the stomach. Heart size within normal limits. Streaky bibasilar opacities. No appreciable pleural fluid collection, although the left costophrenic angle was excluded from the field of view. No pneumothorax. IMPRESSION: 1. Nasogastric tube extends into the stomach. The tube is kinked 90 degrees at the level of the side port within the stomach. Recommend repositioning. 2. Streaky bibasilar  opacities, likely atelectasis. Electronically Signed   By: Davina Poke D.O.   On: 06/23/2022 09:39   DG Abd Portable 1V  Result Date: 06/23/2022 CLINICAL DATA:  Nausea and vomiting in a 71 year old male. EXAM: PORTABLE ABDOMEN - 1 VIEW COMPARISON:  CT of the abdomen and pelvis dated June 21, 2022. FINDINGS: External portion of gastric tube projects over the upper abdomen. Distal tip of this tube is not visible on the current abdominal radiograph. There is scattered loops of gas-filled small bowel and scattered areas of gas-filled colon seen throughout the abdomen in a nonspecific pattern. No sign of abnormal calcification. Spinal degenerative changes and degenerative changes about the bilateral hips. No acute regional skeletal findings. IMPRESSION: 1. External portion of gastric tube projects over the upper abdomen. Distal tip of this tube is not visible on the current abdominal radiograph. Consider correlation with chest radiography to determine whether this tube may be in the distal esophagus. Position could not be assessed on the current radiograph. 2. Nonspecific bowel gas pattern favors ileus. Electronically Signed    By: Zetta Bills M.D.   On: 06/23/2022 08:10    Anti-infectives: Anti-infectives (From admission, onward)    Start     Dose/Rate Route Frequency Ordered Stop   06/23/2022 0200  piperacillin-tazobactam (ZOSYN) IVPB 3.375 g       See Hyperspace for full Linked Orders Report.   3.375 g 12.5 mL/hr over 240 Minutes Intravenous Every 8 hours 07/04/2022 1849     07/02/2022 1900  piperacillin-tazobactam (ZOSYN) IVPB 3.375 g       See Hyperspace for full Linked Orders Report.   3.375 g 100 mL/hr over 30 Minutes Intravenous  Once 07/04/2022 1849 06/09/2022 1940       Assessment/Plan: Post operative abdominal pain with ascites after umbilical hernia repair with mesh by Dr. Rosendo Gros 06/08/22  -POD#3 s/p LAPAROSCOPY DIAGNOSTIC, BIOPSY OF OMENTAL AND PERITONEAL NODULES, ASPIRATION OF ASCITES FOR CYTOLOGY 11/16 Dr. Ninfa Linden - intra-op findings: The patient was found to have what appeared to be diffuse intra-abdominal carcinomatosis with studding of the small and large bowel, omentum, and peritoneum as well as at the level of the diaphragm. We obtained biopsies of the omentum and peritoneal nodules as well as aspirated fluid for cytology.  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 The source of malignancy cannot be easily identified at the time of surgery - pathology and cytology pending. CEA normal/ CA 19-9 normal - Staging CT chest, abdomen, pelvis performed. Will also need oncology consult during hospitalization.  - Clamp NG/ start clear liquids - Mobilize   ID - zosyn 11/15>> FEN - IVF, clamp NG; clear liquids VTE - lovenox Foley - d/c 11/17 once mobilizing   Transaminitis - trending down HTN HLD BPH Hx UTI    I had a long discussion with the patient and his wife on 11/18.  They requested that he be transferred to [facility] for his cancer treatment.  I certainly think that would be a good idea to refer him there as an outpatient to be considered for HIPEC, but it is doubtful that  they would accept a transfer for a post-op ileus.  We will try to get him through this hospitalization and then he can be seen at the [facility] cancer center for evaluation.  We will begin his staging with CT chest/ abd/ pelvis over the weekend and a consultation with oncology here.  LOS: 4 days    RAYMEL CULL 06/25/2022

## 2022-06-25 NOTE — Progress Notes (Signed)
NGT clamped '@0900'$  per order and clears provided. '@1140'$  Zofran given for Nausea yet pt didn't want to put NGT back to suction. Sleeping at 1230, then awake again at 1330 feeling bad and heaving therefore back to LIWS per order. After 5-10 minutes, pt feeling better. No pain reported. Reassurance given.

## 2022-06-25 NOTE — Progress Notes (Signed)
PROGRESS NOTE Cameron Rice  VOJ:[id number] DOB: 1951-02-11 DOA: 06/13/2022 PCP: Flossie Buffy, NP   Brief Narrative/Hospital Course: 71 y.o. with history of hypertension-on Lotrel 10-20, HLD on Lipitor 40,recurrent UTI BPH history of urethral surgery who apparently had umbilical hernia repair on November 2 and since then he has been having abdominal distention and not feeling well, and having nausea vomiting x2 days and abdominal distention and pain  [facility]:CT abdomen pelvis showed significant findings of possible metastatic disease with small bowel obstruction.  Patient has had colonoscopy about a year ago that was normal.  His most recent labs in August of this year where normal but now LFTs are markedly elevated.  Alkaline phosphatase also elevated.  Surgery consulted and admitted.  S/P diagnostic laparoscopy 11/6 Dr. Ninfa Linden and biopsy> found to have peritoneal carcinomatosis CT 11/18-concerning for DVT, PE, placed on heparin drip after discussing with surgery    Subjective: Seen and examined.  Wife at the bedside, no nausea vomiting, Passing slimy liquid BM Overnight afebrile on room air,  WBC count hemoglobin stable, AST ALT elevated  NG output 1500 cc.  Assessment and Plan: Principal Problem:   SBO (small bowel obstruction) (HCC) Active Problems:   Essential hypertension   Complicated UTI (urinary tract infection)   BPH (benign prostatic hyperplasia)   Pure hypercholesterolemia   LFT elevation  Postop abdominal pain with ascites after umbilical hernia repair with mesh Dr. Rosendo Gros 11/2 Diffuse intra-abdominal carcinomatosis with unclear primary source: S/P diagnostic laparoscopy 11/16 Dr. Ninfa Linden and biopsy noted diffuse intra-abdominal carcinomatosis with studding of the small and large bowel omentum and peritoneum as well as at the level of the diaphragm. Pathology and cytology pending, CEA/CA 19-9 normal.CT chest abdomen pelvis with contrast that showed  irregular thickening and nodularity of the peritoneum and omentum, nonspecific thickening of the distal esophagus and gastric antrum/pylorus which may be related to underdistention> hypodensity in the pancreatic neck possibly a small sidebranch IPMN, 10 mm peripancreatic lymph node suspicious for metastases, decreased volume ascites.  Surgery following awaiting return of bowel function> starting clear liquid diet NG clamp per surgery.  Continue antiemetics pain management, IV fluids mobilize.  Surgery planning for referral to [facility] as outpatient to be considered for HIPEC. Cont plan per [facility].  CT finding concerning for PE and suspicious for DVT: ct resulted as " Pulmonary embolus within the proximal superior lingular artery appears eccentric, suggesting a degree of chronicity.Slightly asymmetrically expanded appearance of the right common iliac vein with central rounded hypoattenuation extending to the level of the infrarenal IVC, which persists on the delayed images, suspicious for deep venous thrombosis" discussed w/ [facility] and placed on heparin drip 11/18, duplex of the leg also ordered.  Hemoglobin uptrending no evidence of bleeding.  Significant leukocytosis: UA with WBC 0-5 nitrate positive. CT abdomen showed right lower chest.  Atelectasis in both lower lobes trace right pleural effusion.  Suspect in the setting of 1.  Remains afebrile, on empiric Zosyn, bood culture from 11/16-NGTD Recent Labs  Lab 07/03/2022 1542 07/04/2022 0420 06/23/22 0448 06/24/22 0524 06/25/22 0046  WBC 22.9* 18.2* 24.4* 20.7* 19.3*    Transaminitis with elevated ALP AST ALT,normal TB.  Continue to monitor.   Hypoalbuminemia: in the setting of 1 BPH: monitor voiding History of UTI: UA unremarkable HTN: BP controlled, continue to hold meds  HLD: Meds on hold  DVT prophylaxis: Heparin gtt Code Status:   Code Status: Full Code Family Communication: plan of care discussed with patient/wife  at bedside.  Patient  status is: Inpatient because of SBO Level of care: Med-Surg  Dispo: The patient is from: home            Anticipated disposition: tbd  Objective: Vitals last 24 hrs: Vitals:   06/24/22 0543 06/24/22 1345 06/24/22 2148 06/25/22 0637  BP: 127/86 (!) 145/86 (!) 141/91 125/84  Pulse: 98 98 (!) 101 (!) 104  Resp: '20 16 18 14  '$ Temp: 98.4 F (36.9 C)  97.6 F (36.4 C) (!) 97.4 F (36.3 C)  TempSrc: Oral  Oral Oral  SpO2: 95% 95% 96% 96%  Weight:      Height:       Weight change:   Physical Examination: General exam: AAOX3, weak,older appearing HEENT:Oral mucosa moist, Ear/Nose WNL grossly, dentition normal. Respiratory system: bilaterally CLEAR BS, no use of accessory muscle Cardiovascular system: S1 & S2 +, regular rate. Gastrointestinal system: Abdomen soft, mildly distended hypoactive bowel sounds with minimal diffuse tenderness incision clean dry intact  Nervous System:Alert, awake, moving extremities and grossly nonfocal Extremities: LE ankle edema neg, lower extremities warm Skin:No rashes,no icterus. VEL:FYBOFB muscle bulk,tone, power   Medications reviewed:  Scheduled Meds:  Continuous Infusions:  dextrose 5 % and 0.45% NaCl 50 mL/hr at 06/25/22 0537   heparin 1,350 Units/hr (06/25/22 0202)   methocarbamol (ROBAXIN) IV 500 mg (06/25/22 0526)   piperacillin-tazobactam (ZOSYN)  IV 3.375 g (06/25/22 0135)    Diet Order             Diet clear liquid Room service appropriate? Yes; Fluid consistency: Thin  Diet effective now                  Intake/Output Summary (Last 24 hours) at 06/25/2022 0909 Last data filed at 06/25/2022 0600 Gross per 24 hour  Intake 1568.23 ml  Output 1800 ml  Net -231.77 ml  Net IO Since Admission: 4,539.73 mL [06/25/22 0909]  Wt Readings from Last 3 Encounters:  06/30/2022 78 kg  04/24/22 80 kg  03/09/22 81.8 kg   Unresulted Labs (From admission, onward)     Start     Ordered   06/25/22 0800  Heparin level (unfractionated)   Once-Timed,   TIMED        06/25/22 0113   06/24/22 0500  CBC  Tomorrow morning,   R        06/23/22 0754   06/23/22 0500  CBC with Differential/Platelet  Daily at 5am,   R      06/07/2022 1455   06/23/22 0500  Comprehensive metabolic panel  Daily at 5am,   R      07/06/2022 1455   06/23/22 0500  CBC  Tomorrow morning,   R        07/04/2022 1738          Data Reviewed: I have personally reviewed following labs and imaging studies CBC: Recent Labs  Lab 06/11/2022 1542 06/14/2022 0420 06/23/22 0448 06/24/22 0524 06/25/22 0046  WBC 22.9* 18.2* 24.4* 20.7* 19.3*  NEUTROABS 21.1*  --  22.2* 18.1* 17.1*  HGB 13.6 12.2* 12.6* 11.7* 12.3*  HCT 40.3 37.6* 38.6* 36.8* 37.1*  MCV 90.4 93.5 92.3 94.1 92.5  PLT 325 383 483* 468* 510*   Basic Metabolic Panel: Recent Labs  Lab 06/20/2022 1542 06/23/2022 0420 06/23/22 0448 06/24/22 0524 06/25/22 0046  NA 138 142 142 147* 145  K 4.1 3.9 4.0 3.9 3.6  CL 101 110 110 115* 113*  CO2 '25 25 26 '$ 28  27  GLUCOSE 134* 137* 159* 130* 126*  BUN 24* '22 17 13 15  '$ CREATININE 0.82 0.64 0.63 0.77 0.72  CALCIUM 8.8* 8.6* 8.5* 8.1* 8.3*  MG  --   --   --  2.2  --    GFR: Estimated Creatinine Clearance: 92.8 mL/min (by C-G formula based on SCr of 0.72 mg/dL). Liver Function Tests: Recent Labs  Lab 06/12/2022 1542 06/15/2022 0420 06/23/22 0448 06/24/22 0524 06/25/22 0046  AST 163* 87* 57* 71* 139*  ALT 121* 94* 76* 73* 153*  ALKPHOS 234* 173* 151* 136* 165*  BILITOT 1.2 1.1 0.8 0.9 1.2  PROT 8.7* 7.1 6.7 5.9* 6.4*  ALBUMIN 3.0* 2.4* 2.3* 2.0* 2.2*   Recent Results (from the past 240 hour(s))  Culture, blood (Routine X 2) w Reflex to ID Panel     Status: None (Preliminary result)   Collection Time: 06/18/2022  8:20 AM   Specimen: BLOOD  Result Value Ref Range Status   Specimen Description   Final    BLOOD BLOOD RIGHT HAND Performed at [facility], [place] [address] [place], [place] [address]    Special Requests   Final    BOTTLES  DRAWN AEROBIC AND ANAEROBIC Blood Culture results may not be optimal due to an inadequate volume of blood received in culture bottles Performed at [place] [address], [place], [place] [address]    Culture   Final    NO GROWTH 3 DAYS Performed at [facility], [place] [address], [place], [place] [address]    Report Status PENDING  Incomplete  Culture, blood (Routine X 2) w Reflex to ID Panel     Status: None (Preliminary result)   Collection Time: 06/16/2022  8:31 AM   Specimen: BLOOD  Result Value Ref Range Status   Specimen Description   Final    BLOOD BLOOD RIGHT ARM Performed at [facility] [address] [place], [place] [address]    Special Requests   Final    BOTTLES DRAWN AEROBIC AND ANAEROBIC Blood Culture results may not be optimal due to an inadequate volume of blood received in culture bottles Performed at [place] [address], [place], [place] [address]    Culture   Final    NO GROWTH 3 DAYS Performed at [facility], [place] [address], [place], [place] [address]    Report Status PENDING  Incomplete  Surgical pcr screen     Status: None   Collection Time: 06/10/2022 10:52 AM   Specimen: Nasal Mucosa; Nasal Swab  Result Value Ref Range Status   MRSA, PCR NEGATIVE NEGATIVE Final   Staphylococcus aureus NEGATIVE NEGATIVE Final    Comment: (NOTE) The Xpert SA Assay (FDA approved for NASAL specimens in patients 66 years of age and older), is one component of a comprehensive surveillance program. It is not intended to diagnose infection nor to guide or monitor treatment. Performed at [facility], [place] [address], [place], [place] [address]     Antimicrobials: Anti-infectives (From admission, onward)    Start     Dose/Rate Route Frequency Ordered Stop   06/23/2022 0200  piperacillin-tazobactam (ZOSYN) IVPB 3.375 g       See Hyperspace for full Linked Orders Report.    3.375 g 12.5 mL/hr over 240 Minutes Intravenous Every 8 hours 06/07/2022 1849     06/25/2022 1900  piperacillin-tazobactam (ZOSYN) IVPB 3.375 g  See Hyperspace for full Linked Orders Report.   3.375 g 100 mL/hr over 30 Minutes Intravenous  Once 06/29/2022 1849 07/03/2022 1940      Culture/Microbiology    Component Value Date/Time   SDES  06/07/2022 0831    BLOOD BLOOD RIGHT ARM Performed at [facility], [place] [address] [place], [place] [address]    [place]  06/13/2022 0831    BOTTLES DRAWN AEROBIC AND ANAEROBIC Blood Culture results may not be optimal due to an inadequate volume of blood received in culture bottles Performed at [facility], [place] [address], [place], [place] [address]    CULT  06/30/2022 0831    NO GROWTH 3 DAYS Performed at [place] [address] [place], [place] [address]    REPTSTATUS PENDING 07/01/2022 0831    Other culture-see note  Radiology Studies: CT CHEST W CONTRAST  Result Date: 06/24/2022 CLINICAL DATA:  Oncologic staging. Patient with peritoneal carcinomatosis with unclear primary source. EXAM: CT CHEST, ABDOMEN, AND PELVIS WITH CONTRAST TECHNIQUE: Multidetector CT imaging of the chest, abdomen and pelvis was performed following the standard protocol during bolus administration of intravenous contrast. RADIATION DOSE REDUCTION: This exam was performed according to the departmental dose-optimization program which includes automated exposure control, adjustment of the mA and/or kV according to patient size and/or use of iterative reconstruction technique. CONTRAST:  143m OMNIPAQUE IOHEXOL 300 MG/ML  SOLN COMPARISON:  CT abdomen and pelvis dated 06/10/2022, CT abdomen dated 02/03/2009 FINDINGS: CT CHEST FINDINGS Cardiovascular: Normal heart size. No significant pericardial fluid/thickening. Coronary artery calcifications. Great vessels are normal in course and caliber. Filling defect within the proximal  superior lingular artery (504:171) appears eccentric along the superior aspect of the vessel. Mediastinum/Nodes: thyroid gland without nodules meeting criteria for imaging follow-up by size. Nonspecific mural thickening of the distal esophagus. No pathologically enlarged axillary, supraclavicular, mediastinal, or hilar lymph nodes. Lungs/Pleura: The central airways are patent. There are secretions within the trachea and bilateral proximal main bronchi. Bibasilar subsegmental atelectasis. Accessory azygous fissure. 2 mm left lower lobe nodule (505:86) is unchanged from 20/10, likely benign. No focal consolidation. No pneumothorax. Similar small left and trace right pleural effusions. Musculoskeletal: No acute or abnormal lytic or blastic osseous lesions. CT ABDOMEN PELVIS FINDINGS Hepatobiliary: No focal hepatic lesions. No intra or extrahepatic biliary ductal dilation. Normal gallbladder. Pancreas: 5 mm hypodensity in the pancreatic neck (5:65) no main ductal dilation. Spleen: Normal in size without focal abnormality. Adrenals/Urinary Tract: No adrenal nodules. No suspicious renal mass, calculi, or hydronephrosis. Left interpolar simple/minimally complicated cyst measuring 1.4 cm. Additional subcentimeter hypodensities, too small to characterize. No focal bladder wall thickening. Stomach/Bowel: Enteric tube terminates in the stomach. Nonspecific mural thickening of the gastric antrum/pylorus, which may be related to underdistention. Mildly dilated proximal small bowel loops with gradual caliber transition to the level of the lower abdomen, where there are small bowel loops with irregular mural thickening, for example series 2, image 100, likely serosal implants. normal appendix. Vascular/Lymphatic: Aortic atherosclerosis. Slightly asymmetrically expanded appearance of the right common iliac vein (2:99) with central rounded hypoattenuation extending to the level of the infrarenal IVC, which persists on the delayed  images (7:37). 10 mm peripancreatic lymph node (2:60). Reproductive: Mildly enlarged prostate gland. Other: Decreased small volume ascites. Similar irregular thickening and nodularity of the peritoneum and omentum. Trace pneumoperitoneum, likely postsurgical. Musculoskeletal: No acute or abnormal lytic or blastic osseous findings. Diffuse subcutaneous edema. Trace subcutaneous emphysema, likely postsurgical. Small fat-containing left inguinal  hernia. IMPRESSION: 1. Pulmonary embolus within the proximal superior lingular artery appears eccentric, suggesting a degree of chronicity. 2. Slightly asymmetrically expanded appearance of the right common iliac vein with central rounded hypoattenuation extending to the level of the infrarenal IVC, which persists on the delayed images, suspicious for deep venous thrombosis. 3. Similar irregular thickening and nodularity of the peritoneum and omentum in keeping with peritoneal carcinomatosis. 4. Mildly dilated proximal small bowel loops with gradual caliber transition to the level of the lower abdomen, where there are small bowel loops with irregular mural thickening, likely serosal implants resulting in partial bowel obstruction/ileus. 5. Nonspecific mural thickening of the distal esophagus and gastric antrum/pylorus, which may be related to underdistention. Consider endoscopic evaluation. 6. A 10 mm peripancreatic lymph node is suspicious for metastasis. 7. Decreased small volume ascites. 8. Similar small left and trace right pleural effusions. 9. A 5 mm hypodensity in the pancreatic neck, possibly a small side branch IPMN. Recommend attention on follow-up. 10. Coronary artery calcifications. Aortic Atherosclerosis (ICD10-I70.0). Critical Value/emergent results were called by telephone at the time of interpretation on 06/24/2022 at 4:05 pm to provider Antonieta Pert, who verbally acknowledged these results. Electronically Signed   By: Darrin Nipper M.D.   On: 06/24/2022 16:07   CT  ABDOMEN PELVIS W CONTRAST  Result Date: 06/24/2022 CLINICAL DATA:  Oncologic staging. Patient with peritoneal carcinomatosis with unclear primary source. EXAM: CT CHEST, ABDOMEN, AND PELVIS WITH CONTRAST TECHNIQUE: Multidetector CT imaging of the chest, abdomen and pelvis was performed following the standard protocol during bolus administration of intravenous contrast. RADIATION DOSE REDUCTION: This exam was performed according to the departmental dose-optimization program which includes automated exposure control, adjustment of the mA and/or kV according to patient size and/or use of iterative reconstruction technique. CONTRAST:  130m OMNIPAQUE IOHEXOL 300 MG/ML  SOLN COMPARISON:  CT abdomen and pelvis dated 06/12/2022, CT abdomen dated 02/03/2009 FINDINGS: CT CHEST FINDINGS Cardiovascular: Normal heart size. No significant pericardial fluid/thickening. Coronary artery calcifications. Great vessels are normal in course and caliber. Filling defect within the proximal superior lingular artery (504:171) appears eccentric along the superior aspect of the vessel. Mediastinum/Nodes: thyroid gland without nodules meeting criteria for imaging follow-up by size. Nonspecific mural thickening of the distal esophagus. No pathologically enlarged axillary, supraclavicular, mediastinal, or hilar lymph nodes. Lungs/Pleura: The central airways are patent. There are secretions within the trachea and bilateral proximal main bronchi. Bibasilar subsegmental atelectasis. Accessory azygous fissure. 2 mm left lower lobe nodule (505:86) is unchanged from 20/10, likely benign. No focal consolidation. No pneumothorax. Similar small left and trace right pleural effusions. Musculoskeletal: No acute or abnormal lytic or blastic osseous lesions. CT ABDOMEN PELVIS FINDINGS Hepatobiliary: No focal hepatic lesions. No intra or extrahepatic biliary ductal dilation. Normal gallbladder. Pancreas: 5 mm hypodensity in the pancreatic neck (5:65) no  main ductal dilation. Spleen: Normal in size without focal abnormality. Adrenals/Urinary Tract: No adrenal nodules. No suspicious renal mass, calculi, or hydronephrosis. Left interpolar simple/minimally complicated cyst measuring 1.4 cm. Additional subcentimeter hypodensities, too small to characterize. No focal bladder wall thickening. Stomach/Bowel: Enteric tube terminates in the stomach. Nonspecific mural thickening of the gastric antrum/pylorus, which may be related to underdistention. Mildly dilated proximal small bowel loops with gradual caliber transition to the level of the lower abdomen, where there are small bowel loops with irregular mural thickening, for example series 2, image 100, likely serosal implants. normal appendix. Vascular/Lymphatic: Aortic atherosclerosis. Slightly asymmetrically expanded appearance of the right common iliac vein (2:99) with central rounded  hypoattenuation extending to the level of the infrarenal IVC, which persists on the delayed images (7:37). 10 mm peripancreatic lymph node (2:60). Reproductive: Mildly enlarged prostate gland. Other: Decreased small volume ascites. Similar irregular thickening and nodularity of the peritoneum and omentum. Trace pneumoperitoneum, likely postsurgical. Musculoskeletal: No acute or abnormal lytic or blastic osseous findings. Diffuse subcutaneous edema. Trace subcutaneous emphysema, likely postsurgical. Small fat-containing left inguinal hernia. IMPRESSION: 1. Pulmonary embolus within the proximal superior lingular artery appears eccentric, suggesting a degree of chronicity. 2. Slightly asymmetrically expanded appearance of the right common iliac vein with central rounded hypoattenuation extending to the level of the infrarenal IVC, which persists on the delayed images, suspicious for deep venous thrombosis. 3. Similar irregular thickening and nodularity of the peritoneum and omentum in keeping with peritoneal carcinomatosis. 4. Mildly dilated  proximal small bowel loops with gradual caliber transition to the level of the lower abdomen, where there are small bowel loops with irregular mural thickening, likely serosal implants resulting in partial bowel obstruction/ileus. 5. Nonspecific mural thickening of the distal esophagus and gastric antrum/pylorus, which may be related to underdistention. Consider endoscopic evaluation. 6. A 10 mm peripancreatic lymph node is suspicious for metastasis. 7. Decreased small volume ascites. 8. Similar small left and trace right pleural effusions. 9. A 5 mm hypodensity in the pancreatic neck, possibly a small side branch IPMN. Recommend attention on follow-up. 10. Coronary artery calcifications. Aortic Atherosclerosis (ICD10-I70.0). Critical Value/emergent results were called by telephone at the time of interpretation on 06/24/2022 at 4:05 pm to provider Antonieta Pert, who verbally acknowledged these results. Electronically Signed   By: Darrin Nipper M.D.   On: 06/24/2022 16:07   DG Abd Portable 1V  Result Date: 06/23/2022 CLINICAL DATA:  No NG output EXAM: PORTABLE ABDOMEN - 1 VIEW COMPARISON:  06/23/2022 FINDINGS: NG tube is kinked within the stomach. There are several mildly dilated loops of small bowel throughout the abdomen. No gross free intraperitoneal air. No acute bony findings. IMPRESSION: 1. NG tube is kinked within the stomach.  Recommend repositioning. 2. Several mildly dilated loops of small bowel throughout the abdomen, which may reflect ileus versus obstruction. Electronically Signed   By: Davina Poke D.O.   On: 06/23/2022 09:40   DG CHEST PORT 1 VIEW  Result Date: 06/23/2022 CLINICAL DATA:  No NG tube output EXAM: PORTABLE CHEST 1 VIEW COMPARISON:  None Available. FINDINGS: Nasogastric tube extends into the stomach. The tube is kinked 90 degrees at the level of the side port within the stomach. Heart size within normal limits. Streaky bibasilar opacities. No appreciable pleural fluid collection,  although the left costophrenic angle was excluded from the field of view. No pneumothorax. IMPRESSION: 1. Nasogastric tube extends into the stomach. The tube is kinked 90 degrees at the level of the side port within the stomach. Recommend repositioning. 2. Streaky bibasilar opacities, likely atelectasis. Electronically Signed   By: Davina Poke D.O.   On: 06/23/2022 09:39     LOS: 4 days   Antonieta Pert, MD [facility]  06/25/2022, 9:09 AM

## 2022-06-25 NOTE — Progress Notes (Signed)
ANTICOAGULATION CONSULT NOTE - follow up  Pharmacy Consult for heparin Indication:  VTE treatment  No Known Allergies  Patient Measurements: Height: '5\' 11"'$  (180.3 cm) Weight: 78 kg (171 lb 15.3 oz) IBW/kg (Calculated) : 75.3 Heparin Dosing Weight: n/a. Use TBW of 78 kg  Vital Signs: Temp: 97.6 F (36.4 C) (11/18 2148) Temp Source: Oral (11/18 2148) BP: 141/91 (11/18 2148) Pulse Rate: 101 (11/18 2148)  Labs: Recent Labs    06/08/2022 0420 06/23/22 0448 06/24/22 0524 06/25/22 0046  HGB 12.2* 12.6* 11.7* 12.3*  HCT 37.6* 38.6* 36.8* 37.1*  PLT 383 483* 468* 507*  HEPARINUNFRC  --   --   --  0.57  CREATININE 0.64 0.63 0.77  --      Estimated Creatinine Clearance: 92.8 mL/min (by C-G formula based on SCr of 0.77 mg/dL).   Medical History: Past Medical History:  Diagnosis Date   Hyperlipidemia    Hypertension    UTI (urinary tract infection)     Medications: No anticoagulants prior to admission  Assessment:  Pt is a 71 yoM who underwent diagnostic laparoscopy with biopsy of omental and peritoneal nodules on 11/16 (AET 1541). Pt s/p umbilical hernia repair on 06/08/22.   CT Abdomen (11/18): +PE Venous doppler (11/18): Ordered Pharmacy consulted to dose heparin for VTE treatment.   Today, 06/25/22 HL 0.57 therapeutic on 1350 units Hgb 12.3, plts 507 Per RN no bleeding noted  Discussed with MD - ok to bolus.   Goal of Therapy:  Heparin level 0.3-0.7 units/ml Monitor platelets by anticoagulation protocol: Yes   Plan:  continue heparin infusion at 1350 units/hr Check 6 hour HL HL, CBC daily Monitor for signs of bleeding  Dolly Rias RPh 06/25/2022, 1:12 AM

## 2022-06-25 NOTE — Progress Notes (Signed)
ANTICOAGULATION CONSULT NOTE - follow up  Pharmacy Consult for heparin Indication:  VTE treatment  No Known Allergies  Patient Measurements: Height: '5\' 11"'$  (180.3 cm) Weight: 78 kg (171 lb 15.3 oz) IBW/kg (Calculated) : 75.3 Heparin Dosing Weight: n/a. Use TBW of 78 kg  Vital Signs: Temp: 97.4 F (36.3 C) (11/19 0637) Temp Source: Oral (11/19 1624) BP: 125/84 (11/19 4695) Pulse Rate: 104 (11/19 0637)  Labs: Recent Labs    06/23/22 0448 06/24/22 0524 06/25/22 0046 06/25/22 1000  HGB 12.6* 11.7* 12.3*  --   HCT 38.6* 36.8* 37.1*  --   PLT 483* 468* 507*  --   HEPARINUNFRC  --   --  0.57 0.45  CREATININE 0.63 0.77 0.72  --      Estimated Creatinine Clearance: 92.8 mL/min (by C-G formula based on SCr of 0.72 mg/dL).   Medical History: Past Medical History:  Diagnosis Date   Hyperlipidemia    Hypertension    UTI (urinary tract infection)     Medications: No anticoagulants prior to admission  Assessment:  Pt is a 38 yoM who underwent diagnostic laparoscopy with biopsy of omental and peritoneal nodules on 11/16 (AET 1541). Pt s/p umbilical hernia repair on 06/08/22.   CT Abdomen (11/18): +PE Venous doppler (11/18): Ordered Pharmacy consulted to dose heparin for VTE treatment.   Today, 06/25/22 HL 0.45 therapeutic on 1350 units Hgb 12.3, plts 507 Per RN no bleeding noted  Discussed with MD - ok to bolus.   Goal of Therapy:  Heparin level 0.3-0.7 units/ml Monitor platelets by anticoagulation protocol: Yes   Plan:  Continue heparin infusion at 1350 units/hr HL, CBC daily Monitor for signs of bleeding   Royetta Asal, PharmD, BCPS 06/25/2022 12:29 PM

## 2022-06-25 NOTE — Progress Notes (Signed)
  Transition of Care (TOC) Screening Note   Patient Details  Name: Cameron Rice Date of Birth: May 31, 1951   Transition of Care [facility]) CM/SW Contact:    Kimber Relic, LCSW Phone Number: 06/25/2022, 11:15 AM    Transition of Care Department [facility]) has reviewed patient and no TOC needs have been identified at this time. We will continue to monitor patient advancement through interdisciplinary progression rounds. If new patient transition needs arise, please place a TOC consult.

## 2022-06-25 NOTE — Progress Notes (Signed)
BLE venous duplex has been completed.   Results can be found under chart review under CV PROC. 06/25/2022 5:05 PM Evelio Rueda RVT, RDMS

## 2022-06-26 ENCOUNTER — Inpatient Hospital Stay (HOSPITAL_COMMUNITY): Payer: BC Managed Care – PPO

## 2022-06-26 DIAGNOSIS — K56609 Unspecified intestinal obstruction, unspecified as to partial versus complete obstruction: Secondary | ICD-10-CM | POA: Diagnosis not present

## 2022-06-26 LAB — CBC WITH DIFFERENTIAL/PLATELET
Abs Immature Granulocytes: 0.19 10*3/uL — ABNORMAL HIGH (ref 0.00–0.07)
Basophils Absolute: 0 10*3/uL (ref 0.0–0.1)
Basophils Relative: 0 %
Eosinophils Absolute: 0 10*3/uL (ref 0.0–0.5)
Eosinophils Relative: 0 %
HCT: 38 % — ABNORMAL LOW (ref 39.0–52.0)
Hemoglobin: 12.1 g/dL — ABNORMAL LOW (ref 13.0–17.0)
Immature Granulocytes: 1 %
Lymphocytes Relative: 4 %
Lymphs Abs: 1 10*3/uL (ref 0.7–4.0)
MCH: 30 pg (ref 26.0–34.0)
MCHC: 31.8 g/dL (ref 30.0–36.0)
MCV: 94.3 fL (ref 80.0–100.0)
Monocytes Absolute: 1.5 10*3/uL — ABNORMAL HIGH (ref 0.1–1.0)
Monocytes Relative: 6 %
Neutro Abs: 22 10*3/uL — ABNORMAL HIGH (ref 1.7–7.7)
Neutrophils Relative %: 89 %
Platelets: 558 10*3/uL — ABNORMAL HIGH (ref 150–400)
RBC: 4.03 MIL/uL — ABNORMAL LOW (ref 4.22–5.81)
RDW: 13.2 % (ref 11.5–15.5)
WBC: 24.7 10*3/uL — ABNORMAL HIGH (ref 4.0–10.5)
nRBC: 0 % (ref 0.0–0.2)

## 2022-06-26 LAB — COMPREHENSIVE METABOLIC PANEL
ALT: 89 U/L — ABNORMAL HIGH (ref 0–44)
AST: 41 U/L (ref 15–41)
Albumin: 2.1 g/dL — ABNORMAL LOW (ref 3.5–5.0)
Alkaline Phosphatase: 144 U/L — ABNORMAL HIGH (ref 38–126)
Anion gap: 7 (ref 5–15)
BUN: 17 mg/dL (ref 8–23)
CO2: 29 mmol/L (ref 22–32)
Calcium: 8.2 mg/dL — ABNORMAL LOW (ref 8.9–10.3)
Chloride: 111 mmol/L (ref 98–111)
Creatinine, Ser: 0.84 mg/dL (ref 0.61–1.24)
GFR, Estimated: >60 mL/min (ref >60)
Glucose, Bld: 136 mg/dL — ABNORMAL HIGH (ref 70–99)
Potassium: 3.2 mmol/L — ABNORMAL LOW (ref 3.5–5.1)
Sodium: 147 mmol/L — ABNORMAL HIGH (ref 135–145)
Total Bilirubin: 1.3 mg/dL — ABNORMAL HIGH (ref 0.3–1.2)
Total Protein: 6 g/dL — ABNORMAL LOW (ref 6.5–8.1)

## 2022-06-26 LAB — HEPARIN LEVEL (UNFRACTIONATED): Heparin Unfractionated: 0.33 IU/mL (ref 0.30–0.70)

## 2022-06-26 LAB — CYTOLOGY - NON PAP

## 2022-06-26 MED ORDER — POTASSIUM CL IN DEXTROSE 5% 20 MEQ/L IV SOLN
20.0000 meq | INTRAVENOUS | Status: DC
  Administered 2022-06-26: 20 meq via INTRAVENOUS
  Filled 2022-06-26 (×2): qty 1000

## 2022-06-26 MED ORDER — POTASSIUM CHLORIDE 10 MEQ/100ML IV SOLN
10.0000 meq | INTRAVENOUS | Status: AC
  Administered 2022-06-26 (×4): 10 meq via INTRAVENOUS
  Filled 2022-06-26 (×4): qty 100

## 2022-06-26 MED ORDER — POTASSIUM CHLORIDE CRYS ER 20 MEQ PO TBCR
40.0000 meq | EXTENDED_RELEASE_TABLET | Freq: Once | ORAL | Status: DC
  Filled 2022-06-26: qty 2

## 2022-06-26 NOTE — Consult Note (Signed)
[place] NOTE  Patient Care Team: Nche, Charlene Brooke, NP as PCP - General (Internal Medicine)  CHIEF COMPLAINTS/PURPOSE OF CONSULTATION:  Newly diagnosed peritoneal carcinomatosis  HISTORY OF PRESENTING ILLNESS:  Cameron Rice 71 y.o. male is here because of recent diagnosis of small bowel obstruction who on CT scan was noted to have peritoneal carcinomatosis.  He underwent a biopsy on 06/27/2022.  Pathology is pending.  He reports to me that prior to his hernia surgery in November 2 he was feeling well.  Subsequently he started having abdominal distention and nausea and vomiting.  At [facility] he was diagnosed with small bowel obstruction.  CT scans showed findings concerning for DVT and PE and he was started on heparin drip.  We are consulted to assist with oncology management.  I reviewed her records extensively and collaborated the history with the patient.   MEDICAL HISTORY:  Past Medical History:  Diagnosis Date   Hyperlipidemia    Hypertension    UTI (urinary tract infection)     SURGICAL HISTORY: Past Surgical History:  Procedure Laterality Date   LAPAROSCOPY N/A 07/02/2022   Procedure: LAPAROSCOPY DIAGNOSTIC;  Surgeon: Coralie Keens, MD;  Location: [facility];  Service: General;  Laterality: N/A;  with Biopsy of peritoneal nodules   URETHRA SURGERY     went in clean make sure urethra is clear    SOCIAL HISTORY: Social History   Socioeconomic History   Marital status: Married    Spouse name: Not on file   Number of children: Not on file   Years of education: Not on file   Highest education level: Not on file  Occupational History   Not on file  Tobacco Use   Smoking status: Never   Smokeless tobacco: Never  Vaping Use   Vaping Use: Never used  Substance and Sexual Activity   Alcohol use: No   Drug use: No   Sexual activity: Yes  Other Topics Concern   Not on file  Social History Narrative   Not on file   Social  Determinants of Health   Financial Resource Strain: Not on file  Food Insecurity: Not on file  Transportation Needs: Not on file  Physical Activity: Not on file  Stress: Not on file  Social Connections: Not on file  Intimate Partner Violence: Not on file    FAMILY HISTORY: Family History  Problem Relation Age of Onset   Cancer Mother    Cancer Brother        liver cancer   Cancer Maternal Aunt        lung    ALLERGIES:  has No Known Allergies.  MEDICATIONS:  Current Facility-Administered Medications  Medication Dose Route Frequency Provider Last Rate Last Admin   dextrose 5 % with KCl 20 mEq / L  infusion  20 mEq Intravenous Continuous Kc, Ramesh, MD 50 mL/hr at 06/26/22 1230 20 mEq at 06/26/22 1230   heparin ADULT infusion 100 units/mL (25000 units/245m)  1,350 Units/hr Intravenous Continuous SLenis Noon RPH 14 mL/hr at 06/26/22 0914 1,400 Units/hr at 06/26/22 0914   menthol-cetylpyridinium (CEPACOL) lozenge 3 mg  1 lozenge Oral PRN CRaenette Rover NP   3 mg at 06/24/22 2156   methocarbamol (ROBAXIN) 500 mg in dextrose 5 % 50 mL IVPB  500 mg Intravenous Q6H PRN MMargie BilletA, PA-C 110 mL/hr at 06/25/22 0526 500 mg at 06/25/22 0526   morphine (PF) 2 MG/ML injection 2-4 mg  2-4 mg  Intravenous Q1H PRN Coralie Keens, MD   2 mg at 06/26/22 1208   ondansetron (ZOFRAN) tablet 4 mg  4 mg Oral Q6H PRN Coralie Keens, MD       Or   ondansetron [facility]) injection 4 mg  4 mg Intravenous Q6H PRN Coralie Keens, MD   4 mg at 06/25/22 2008   phenol (CHLORASEPTIC) mouth spray 1 spray  1 spray Mouth/Throat PRN Antonieta Pert, MD   1 spray at 06/14/2022 2348   piperacillin-tazobactam (ZOSYN) IVPB 3.375 g  3.375 g Intravenous Sharen Hones, MD 12.5 mL/hr at 06/26/22 0912 3.375 g at 06/26/22 0912   prochlorperazine (COMPAZINE) injection 10 mg  10 mg Intravenous Q6H PRN Meuth, Brooke A, PA-C        REVIEW OF SYSTEMS:   Constitutional: Denies fevers, chills or abnormal night  sweats Abdomen: Distention, NG tube All other systems were reviewed with the patient and are negative.  PHYSICAL EXAMINATION: ECOG PERFORMANCE STATUS: 3 - Symptomatic, >50% confined to bed  Vitals:   06/26/22 0605 06/26/22 1351  BP: (!) 129/94 (!) 134/94  Pulse: 96 96  Resp: 18 18  Temp: 98.3 F (36.8 C) 98.5 F (36.9 C)  SpO2: 97% 96%   Filed Weights   06/17/2022 1535 07/04/2022 1832 06/20/2022 1132  Weight: 180 lb (81.6 kg) 174 lb (78.9 kg) 171 lb 15.3 oz (78 kg)    GENERAL:alert, no distress and comfortable Abdomen: Mild -mod distention  LABORATORY DATA:  I have reviewed the data as listed Lab Results  Component Value Date   WBC 24.7 (H) 06/26/2022   HGB 12.1 (L) 06/26/2022   HCT 38.0 (L) 06/26/2022   MCV 94.3 06/26/2022   PLT 558 (H) 06/26/2022   Lab Results  Component Value Date   NA 147 (H) 06/26/2022   K 3.2 (L) 06/26/2022   CL 111 06/26/2022   CO2 29 06/26/2022    RADIOGRAPHIC STUDIES: I have personally reviewed the radiological reports and agreed with the findings in the report.  ASSESSMENT AND PLAN:  Peritoneal carcinomatosis: Unclear what the primary could be.  Pathology report is eagerly awaited.  Patient's wife informed me that they would like to receive their oncology care at [facility] because they have had many family members who got treated there. I instructed patient's wife to call [facility] as soon as pathology is available so that he could be set up for an outpatient follow-up. Small bowel obstruction with NG tube placement.  He tells me that he may have been passing some gas and some mucus discharge.  Goals of care or other discussions regarding treatment options are not possible without pathology report. We will come back and talk to the patient once I have the pathology report.   All questions were answered. The patient knows to call the clinic with any problems, questions or concerns.    Harriette Ohara, MD '@T'$ @

## 2022-06-26 NOTE — Progress Notes (Signed)
PROGRESS NOTE Cameron Rice  VEL:[id number] DOB: 23-Jan-1951 DOA: 06/27/2022 PCP: Flossie Buffy, NP   Brief Narrative/Hospital Course: 71 y.o. with history of hypertension-on Lotrel 10-20, HLD on Lipitor 40,recurrent UTI BPH history of urethral surgery who apparently had umbilical hernia repair on November 2 and since then he has been having abdominal distention and not feeling well, and having nausea vomiting x2 days and abdominal distention and pain  [facility]:CT abdomen pelvis showed significant findings of possible metastatic disease with small bowel obstruction.  Patient has had colonoscopy about a year ago that was normal.  His most recent labs in August of this year where normal but now LFTs are markedly elevated.  Alkaline phosphatase also elevated.  Surgery consulted and admitted.  S/P diagnostic laparoscopy 11/6 Dr. Ninfa Linden and biopsy> found to have peritoneal carcinomatosis CT 11/18-concerning for DVT, PE, placed on heparin drip after discussing with surgery    Subjective: Seen and examined this am Did not do too well with NG clamping 11/19> had nausea vomiting Overnight afebrile  Labs with mild hyponatremia, hypokalemia TB up,LFTs better   Assessment and Plan: Principal Problem:   SBO (small bowel obstruction) ([place]) Active Problems:   Essential hypertension   Complicated UTI (urinary tract infection)   BPH (benign prostatic hyperplasia)   Pure hypercholesterolemia   LFT elevation  Postop abdominal pain with ascites after umbilical hernia repair with mesh Dr. Rosendo Gros 11/2 Diffuse intra-abdominal carcinomatosis with unclear primary source: S/P diagnostic laparoscopy 11/16 Dr. Ninfa Linden and biopsy noted diffuse intra-abdominal carcinomatosis with studding of the small and large bowel omentum and peritoneum as well as at the level of the diaphragm. Pathology and cytology pending, CEA/CA 19-9 normal.CT chest abdomen pelvis with contrast that showed irregular  thickening and nodularity of the peritoneum and omentum, nonspecific thickening of the distal esophagus and gastric antrum/pylorus which may be related to underdistention> hypodensity in the pancreatic neck possibly a small sidebranch IPMN, 10 mm peripancreatic lymph node suspicious for metastases, decreased volume ascites.  Surgery following awaiting return of bowel function> did not do too well with NG clamping, not NG back to intermittent suctioning, continue ambulation pain control-hydration antiemetics.  Oncology consulted today. Surgery planning for referral to [facility] as outpatient to be considered for HIPEC.   CT finding concerning for PE and suspicious for DVT: CT reported as " Pulmonary embolus within the proximal superior lingular artery appears eccentric, suggesting a degree of chronicity.Slightly asymmetrically expanded appearance of the right common iliac vein with central rounded hypoattenuation extending to the level of the infrarenal IVC, which persists on the delayed images, suspicious for deep venous thrombosis" discussed w/ [facility] and placed on heparin drip 11/18.  Duplex unremarkable, oncology to see.  No more bleeding tolerating well.  Significant leukocytosis: UA with WBC 0-5 nitrate positive. CT abdomen showed right lower chest.  Atelectasis in both lower lobes trace right pleural effusion. Suspect in the setting of 1> overnight bump in WBC count. He is on empiric Zosyn, bood culture from 11/16-NGTD. Recent Labs  Lab 06/12/2022 0420 06/23/22 0448 06/24/22 0524 06/25/22 0046 06/26/22 0501  WBC 18.2* 24.4* 20.7* 19.3* 24.7*  Hypokalemia: replacement ordered iv and ivf. Transaminitis with elevated ALP AST ALT,normal TB.  Continue to monitor.   Hypoalbuminemia: in the setting of 1 CHE:NIDPOEU History of UTI: UA unremarkable HTN: BP is controlled, continue to hold meds  HLD: po meds hold  DVT prophylaxis: Heparin gtt Code Status:   Code Status: Full Code Family Communication:  plan  of care discussed with patient/wife  at bedside.  Patient status is: Inpatient because of SBO Level of care: Med-Surg  Dispo: The patient is from: home            Anticipated disposition: tbd  Objective: Vitals last 24 hrs: Vitals:   06/25/22 0637 06/25/22 1319 06/25/22 2048 06/26/22 0605  BP: 125/84 (!) 131/91 (!) 145/90 (!) 129/94  Pulse: (!) 104 (!) 106 (!) 107 96  Resp: '14 16 17 18  '$ Temp: (!) 97.4 F (36.3 C) 98 F (36.7 C) 98.4 F (36.9 C) 98.3 F (36.8 C)  TempSrc: Oral Oral Oral Oral  SpO2: 96% 95% 95% 97%  Weight:      Height:       Weight change:   Physical Examination: General exam: Aaox3, pleasant, NG tube in place, not in distress. HEENT:Oral mucosa moist, Ear/Nose WNL grossly, dentition normal. Respiratory system: bilaterally clear BS, no use of accessory muscle Cardiovascular system: S1 & S2 +, regular rate. Gastrointestinal system: Abdomen soft, mild to moderate distention with diffuse tenderness incision site clean dry intact,  Nervous System:Alert, awake, moving extremities and grossly nonfocal Extremities: LE ankle edema neg, lower extremities warm Skin: No rashes,no icterus. MSK: Normal muscle bulk,tone, power    Medications reviewed:  Scheduled Meds:   Continuous Infusions:  dextrose 5 % with KCl 20 mEq / L     heparin 1,400 Units/hr (06/26/22 0914)   methocarbamol (ROBAXIN) IV 500 mg (06/25/22 0526)   piperacillin-tazobactam (ZOSYN)  IV 3.375 g (06/26/22 0912)   potassium chloride 10 mEq (06/26/22 0927)    Diet Order             Diet NPO time specified Except for: Ice Chips, Sips with Meds, Other (See Comments)  Diet effective now                  Intake/Output Summary (Last 24 hours) at 06/26/2022 0940 Last data filed at 06/26/2022 0618 Gross per 24 hour  Intake 1907.76 ml  Output 1300 ml  Net 607.76 ml  Net IO Since Admission: 5,147.49 mL [06/26/22 0940]  Wt Readings from Last 3 Encounters:  06/20/2022 78 kg  04/24/22 80 kg   03/09/22 81.8 kg   Unresulted Labs (From admission, onward)     Start     Ordered   06/26/22 0500  Heparin level (unfractionated)  Daily at 5am,   R      06/25/22 1230   06/24/22 0500  CBC  Tomorrow morning,   R        06/23/22 0754   06/23/22 0500  CBC with Differential/Platelet  Daily at 5am,   R      07/03/2022 1455   06/23/22 0500  Comprehensive metabolic panel  Daily at 5am,   R      06/20/2022 1455   06/23/22 0500  CBC  Tomorrow morning,   R        06/15/2022 1738          Data Reviewed: I have personally reviewed following labs and imaging studies CBC: Recent Labs  Lab 07/02/2022 1542 07/01/2022 0420 06/23/22 0448 06/24/22 0524 06/25/22 0046 06/26/22 0501  WBC 22.9* 18.2* 24.4* 20.7* 19.3* 24.7*  NEUTROABS 21.1*  --  22.2* 18.1* 17.1* 22.0*  HGB 13.6 12.2* 12.6* 11.7* 12.3* 12.1*  HCT 40.3 37.6* 38.6* 36.8* 37.1* 38.0*  MCV 90.4 93.5 92.3 94.1 92.5 94.3  PLT 325 383 483* 468* 507* 545*   Basic Metabolic Panel: Recent Labs  Lab 06/17/2022 0420 06/23/22 0448 06/24/22 0524 06/25/22 0046 06/26/22 0501  NA 142 142 147* 145 147*  K 3.9 4.0 3.9 3.6 3.2*  CL 110 110 115* 113* 111  CO2 '25 26 28 27 29  '$ GLUCOSE 137* 159* 130* 126* 136*  BUN '22 17 13 15 17  '$ CREATININE 0.64 0.63 0.77 0.72 0.84  CALCIUM 8.6* 8.5* 8.1* 8.3* 8.2*  MG  --   --  2.2  --   --    GFR: Estimated Creatinine Clearance: 88.4 mL/min (by C-G formula based on SCr of 0.84 mg/dL). Liver Function Tests: Recent Labs  Lab 06/18/2022 0420 06/23/22 0448 06/24/22 0524 06/25/22 0046 06/26/22 0501  AST 87* 57* 71* 139* 41  ALT 94* 76* 73* 153* 89*  ALKPHOS 173* 151* 136* 165* 144*  BILITOT 1.1 0.8 0.9 1.2 1.3*  PROT 7.1 6.7 5.9* 6.4* 6.0*  ALBUMIN 2.4* 2.3* 2.0* 2.2* 2.1*   Recent Results (from the past 240 hour(s))  Culture, blood (Routine X 2) w Reflex to ID Panel     Status: None (Preliminary result)   Collection Time: 06/09/2022  8:20 AM   Specimen: BLOOD  Result Value Ref Range Status   Specimen  Description   Final    BLOOD BLOOD RIGHT HAND Performed at [facility], [place] [address] [place], [place] [address]    Special Requests   Final    BOTTLES DRAWN AEROBIC AND ANAEROBIC Blood Culture results may not be optimal due to an inadequate volume of blood received in culture bottles Performed at [place] [address] [place], [place] [address]    Culture   Final    NO GROWTH 4 DAYS Performed at [facility], [place] [address] [place], [place] [address]    Report Status PENDING  Incomplete  Culture, blood (Routine X 2) w Reflex to ID Panel     Status: None (Preliminary result)   Collection Time: 07/04/2022  8:31 AM   Specimen: BLOOD  Result Value Ref Range Status   Specimen Description   Final    BLOOD BLOOD RIGHT ARM Performed at [place] [address] [place], [place] [address]    Special Requests   Final    BOTTLES DRAWN AEROBIC AND ANAEROBIC Blood Culture results may not be optimal due to an inadequate volume of blood received in culture bottles Performed at [place] [address] [place], [place] [address]    Culture   Final    NO GROWTH 4 DAYS Performed at [facility], [place] [address], [place] [employer], [place] [address]    Report Status PENDING  Incomplete  Surgical pcr screen     Status: None   Collection Time: 06/14/2022 10:52 AM   Specimen: Nasal Mucosa; Nasal Swab  Result Value Ref Range Status   MRSA, PCR NEGATIVE NEGATIVE Final   Staphylococcus aureus NEGATIVE NEGATIVE Final    Comment: (NOTE) The Xpert SA Assay (FDA approved for NASAL specimens in patients 31 years of age and older), is one component of a comprehensive surveillance program. It is not intended to diagnose infection nor to guide or monitor treatment. Performed at [facility], [place] [address] [place], [place] [address]     Antimicrobials: Anti-infectives (From admission,  onward)    Start     Dose/Rate Route Frequency Ordered Stop   06/08/2022 0200  piperacillin-tazobactam (ZOSYN) IVPB 3.375 g       See  Hyperspace for full Linked Orders Report.   3.375 g 12.5 mL/hr over 240 Minutes Intravenous Every 8 hours 07/03/2022 1849     07/06/2022 1900  piperacillin-tazobactam (ZOSYN) IVPB 3.375 g       See Hyperspace for full Linked Orders Report.   3.375 g 100 mL/hr over 30 Minutes Intravenous  Once 06/24/2022 1849 06/28/2022 1940      Culture/Microbiology    Component Value Date/Time   SDES  06/20/2022 0831    BLOOD BLOOD RIGHT ARM Performed at [facility], [place] [address], [place], [place] [address]    [place]  06/29/2022 0831    BOTTLES DRAWN AEROBIC AND ANAEROBIC Blood Culture results may not be optimal due to an inadequate volume of blood received in culture bottles Performed at [facility], [place] [address] [place], [place] [address]    CULT  06/28/2022 0831    NO GROWTH 4 DAYS Performed at [place] [address] [place], [place] [address]    REPTSTATUS PENDING 06/12/2022 0831    Other culture-see note  Radiology Studies: VAS [place] LOWER EXTREMITY VENOUS (DVT)  Result Date: 06/25/2022  Lower Venous DVT Study Patient Name:  Ralphie L Rice  Date of Exam:   06/25/2022 Medical Rec #: [id number]         Accession #:    [id number] Date of Birth: Jun 13, 1951         Patient Gender: M Patient Age:   12 years Exam Location:  [facility] Procedure:      VAS [place] LOWER EXTREMITY VENOUS (DVT) Referring Phys: [place] --------------------------------------------------------------------------------  Indications: Pain.  Comparison Study: No previous exams Performing Technologist: Jody Hill RVT, RDMS  Examination Guidelines: A complete evaluation includes B-mode imaging, spectral Doppler, color Doppler, and power Doppler as needed of all accessible portions of each vessel. Bilateral testing is considered an integral  part of a complete examination. Limited examinations for reoccurring indications may be performed as noted. The reflux portion of the exam is performed with the patient in reverse Trendelenburg.  +---------+---------------+---------+-----------+----------+--------------+ RIGHT    CompressibilityPhasicitySpontaneityPropertiesThrombus Aging +---------+---------------+---------+-----------+----------+--------------+ CFV      Full           Yes      Yes                                 +---------+---------------+---------+-----------+----------+--------------+ SFJ      Full                                                        +---------+---------------+---------+-----------+----------+--------------+ FV Prox  Full           Yes      Yes                                 +---------+---------------+---------+-----------+----------+--------------+ FV Mid   Full           Yes      Yes                                 +---------+---------------+---------+-----------+----------+--------------+ FV DistalFull  Yes      Yes                                 +---------+---------------+---------+-----------+----------+--------------+ PFV      Full                                                        +---------+---------------+---------+-----------+----------+--------------+ POP      Full           Yes      Yes                                 +---------+---------------+---------+-----------+----------+--------------+ PTV      Full                                                        +---------+---------------+---------+-----------+----------+--------------+ PERO     Full                                                        +---------+---------------+---------+-----------+----------+--------------+   +---------+---------------+---------+-----------+----------+--------------+ LEFT     CompressibilityPhasicitySpontaneityPropertiesThrombus Aging  +---------+---------------+---------+-----------+----------+--------------+ CFV      Full           Yes      Yes                                 +---------+---------------+---------+-----------+----------+--------------+ SFJ      Full                                                        +---------+---------------+---------+-----------+----------+--------------+ FV Prox  Full           Yes      Yes                                 +---------+---------------+---------+-----------+----------+--------------+ FV Mid   Full           Yes      Yes                                 +---------+---------------+---------+-----------+----------+--------------+ FV DistalFull           Yes      Yes                                 +---------+---------------+---------+-----------+----------+--------------+ PFV      Full                                                        +---------+---------------+---------+-----------+----------+--------------+  POP      Full           Yes      Yes                                 +---------+---------------+---------+-----------+----------+--------------+ PTV      Full                                                        +---------+---------------+---------+-----------+----------+--------------+ PERO     Full                                                        +---------+---------------+---------+-----------+----------+--------------+     Summary: BILATERAL: - No evidence of deep vein thrombosis seen in the lower extremities, bilaterally. -No evidence of popliteal cyst, bilaterally.   *See table(s) above for measurements and observations. Electronically signed by Deitra Mayo MD on 06/25/2022 at 7:09:32 PM.    Final    CT CHEST W CONTRAST  Result Date: 06/24/2022 CLINICAL DATA:  Oncologic staging. Patient with peritoneal carcinomatosis with unclear primary source. EXAM: CT CHEST, ABDOMEN, AND PELVIS WITH CONTRAST  TECHNIQUE: Multidetector CT imaging of the chest, abdomen and pelvis was performed following the standard protocol during bolus administration of intravenous contrast. RADIATION DOSE REDUCTION: This exam was performed according to the departmental dose-optimization program which includes automated exposure control, adjustment of the mA and/or kV according to patient size and/or use of iterative reconstruction technique. CONTRAST:  147m OMNIPAQUE IOHEXOL 300 MG/ML  SOLN COMPARISON:  CT abdomen and pelvis dated 06/25/2022, CT abdomen dated 02/03/2009 FINDINGS: CT CHEST FINDINGS Cardiovascular: Normal heart size. No significant pericardial fluid/thickening. Coronary artery calcifications. Great vessels are normal in course and caliber. Filling defect within the proximal superior lingular artery (504:171) appears eccentric along the superior aspect of the vessel. Mediastinum/Nodes: thyroid gland without nodules meeting criteria for imaging follow-up by size. Nonspecific mural thickening of the distal esophagus. No pathologically enlarged axillary, supraclavicular, mediastinal, or hilar lymph nodes. Lungs/Pleura: The central airways are patent. There are secretions within the trachea and bilateral proximal main bronchi. Bibasilar subsegmental atelectasis. Accessory azygous fissure. 2 mm left lower lobe nodule (505:86) is unchanged from 20/10, likely benign. No focal consolidation. No pneumothorax. Similar small left and trace right pleural effusions. Musculoskeletal: No acute or abnormal lytic or blastic osseous lesions. CT ABDOMEN PELVIS FINDINGS Hepatobiliary: No focal hepatic lesions. No intra or extrahepatic biliary ductal dilation. Normal gallbladder. Pancreas: 5 mm hypodensity in the pancreatic neck (5:65) no main ductal dilation. Spleen: Normal in size without focal abnormality. Adrenals/Urinary Tract: No adrenal nodules. No suspicious renal mass, calculi, or hydronephrosis. Left interpolar simple/minimally  complicated cyst measuring 1.4 cm. Additional subcentimeter hypodensities, too small to characterize. No focal bladder wall thickening. Stomach/Bowel: Enteric tube terminates in the stomach. Nonspecific mural thickening of the gastric antrum/pylorus, which may be related to underdistention. Mildly dilated proximal small bowel loops with gradual caliber transition to the level of the lower abdomen, where there are small bowel loops with irregular mural thickening, for example series 2, image 100, likely serosal implants. normal appendix. Vascular/Lymphatic:  Aortic atherosclerosis. Slightly asymmetrically expanded appearance of the right common iliac vein (2:99) with central rounded hypoattenuation extending to the level of the infrarenal IVC, which persists on the delayed images (7:37). 10 mm peripancreatic lymph node (2:60). Reproductive: Mildly enlarged prostate gland. Other: Decreased small volume ascites. Similar irregular thickening and nodularity of the peritoneum and omentum. Trace pneumoperitoneum, likely postsurgical. Musculoskeletal: No acute or abnormal lytic or blastic osseous findings. Diffuse subcutaneous edema. Trace subcutaneous emphysema, likely postsurgical. Small fat-containing left inguinal hernia. IMPRESSION: 1. Pulmonary embolus within the proximal superior lingular artery appears eccentric, suggesting a degree of chronicity. 2. Slightly asymmetrically expanded appearance of the right common iliac vein with central rounded hypoattenuation extending to the level of the infrarenal IVC, which persists on the delayed images, suspicious for deep venous thrombosis. 3. Similar irregular thickening and nodularity of the peritoneum and omentum in keeping with peritoneal carcinomatosis. 4. Mildly dilated proximal small bowel loops with gradual caliber transition to the level of the lower abdomen, where there are small bowel loops with irregular mural thickening, likely serosal implants resulting in  partial bowel obstruction/ileus. 5. Nonspecific mural thickening of the distal esophagus and gastric antrum/pylorus, which may be related to underdistention. Consider endoscopic evaluation. 6. A 10 mm peripancreatic lymph node is suspicious for metastasis. 7. Decreased small volume ascites. 8. Similar small left and trace right pleural effusions. 9. A 5 mm hypodensity in the pancreatic neck, possibly a small side branch IPMN. Recommend attention on follow-up. 10. Coronary artery calcifications. Aortic Atherosclerosis (ICD10-I70.0). Critical Value/emergent results were called by telephone at the time of interpretation on 06/24/2022 at 4:05 pm to provider Antonieta Pert, who verbally acknowledged these results. Electronically Signed   By: Darrin Nipper M.D.   On: 06/24/2022 16:07   CT ABDOMEN PELVIS W CONTRAST  Result Date: 06/24/2022 CLINICAL DATA:  Oncologic staging. Patient with peritoneal carcinomatosis with unclear primary source. EXAM: CT CHEST, ABDOMEN, AND PELVIS WITH CONTRAST TECHNIQUE: Multidetector CT imaging of the chest, abdomen and pelvis was performed following the standard protocol during bolus administration of intravenous contrast. RADIATION DOSE REDUCTION: This exam was performed according to the departmental dose-optimization program which includes automated exposure control, adjustment of the mA and/or kV according to patient size and/or use of iterative reconstruction technique. CONTRAST:  173m OMNIPAQUE IOHEXOL 300 MG/ML  SOLN COMPARISON:  CT abdomen and pelvis dated 06/26/2022, CT abdomen dated 02/03/2009 FINDINGS: CT CHEST FINDINGS Cardiovascular: Normal heart size. No significant pericardial fluid/thickening. Coronary artery calcifications. Great vessels are normal in course and caliber. Filling defect within the proximal superior lingular artery (504:171) appears eccentric along the superior aspect of the vessel. Mediastinum/Nodes: thyroid gland without nodules meeting criteria for imaging  follow-up by size. Nonspecific mural thickening of the distal esophagus. No pathologically enlarged axillary, supraclavicular, mediastinal, or hilar lymph nodes. Lungs/Pleura: The central airways are patent. There are secretions within the trachea and bilateral proximal main bronchi. Bibasilar subsegmental atelectasis. Accessory azygous fissure. 2 mm left lower lobe nodule (505:86) is unchanged from 20/10, likely benign. No focal consolidation. No pneumothorax. Similar small left and trace right pleural effusions. Musculoskeletal: No acute or abnormal lytic or blastic osseous lesions. CT ABDOMEN PELVIS FINDINGS Hepatobiliary: No focal hepatic lesions. No intra or extrahepatic biliary ductal dilation. Normal gallbladder. Pancreas: 5 mm hypodensity in the pancreatic neck (5:65) no main ductal dilation. Spleen: Normal in size without focal abnormality. Adrenals/Urinary Tract: No adrenal nodules. No suspicious renal mass, calculi, or hydronephrosis. Left interpolar simple/minimally complicated cyst measuring 1.4 cm. Additional subcentimeter hypodensities,  too small to characterize. No focal bladder wall thickening. Stomach/Bowel: Enteric tube terminates in the stomach. Nonspecific mural thickening of the gastric antrum/pylorus, which may be related to underdistention. Mildly dilated proximal small bowel loops with gradual caliber transition to the level of the lower abdomen, where there are small bowel loops with irregular mural thickening, for example series 2, image 100, likely serosal implants. normal appendix. Vascular/Lymphatic: Aortic atherosclerosis. Slightly asymmetrically expanded appearance of the right common iliac vein (2:99) with central rounded hypoattenuation extending to the level of the infrarenal IVC, which persists on the delayed images (7:37). 10 mm peripancreatic lymph node (2:60). Reproductive: Mildly enlarged prostate gland. Other: Decreased small volume ascites. Similar irregular thickening and  nodularity of the peritoneum and omentum. Trace pneumoperitoneum, likely postsurgical. Musculoskeletal: No acute or abnormal lytic or blastic osseous findings. Diffuse subcutaneous edema. Trace subcutaneous emphysema, likely postsurgical. Small fat-containing left inguinal hernia. IMPRESSION: 1. Pulmonary embolus within the proximal superior lingular artery appears eccentric, suggesting a degree of chronicity. 2. Slightly asymmetrically expanded appearance of the right common iliac vein with central rounded hypoattenuation extending to the level of the infrarenal IVC, which persists on the delayed images, suspicious for deep venous thrombosis. 3. Similar irregular thickening and nodularity of the peritoneum and omentum in keeping with peritoneal carcinomatosis. 4. Mildly dilated proximal small bowel loops with gradual caliber transition to the level of the lower abdomen, where there are small bowel loops with irregular mural thickening, likely serosal implants resulting in partial bowel obstruction/ileus. 5. Nonspecific mural thickening of the distal esophagus and gastric antrum/pylorus, which may be related to underdistention. Consider endoscopic evaluation. 6. A 10 mm peripancreatic lymph node is suspicious for metastasis. 7. Decreased small volume ascites. 8. Similar small left and trace right pleural effusions. 9. A 5 mm hypodensity in the pancreatic neck, possibly a small side branch IPMN. Recommend attention on follow-up. 10. Coronary artery calcifications. Aortic Atherosclerosis (ICD10-I70.0). Critical Value/emergent results were called by telephone at the time of interpretation on 06/24/2022 at 4:05 pm to provider Antonieta Pert, who verbally acknowledged these results. Electronically Signed   By: Darrin Nipper M.D.   On: 06/24/2022 16:07     LOS: 5 days   Antonieta Pert, MD [facility]  06/26/2022, 9:40 AM

## 2022-06-26 NOTE — Progress Notes (Signed)
4 Days Post-Op   Subjective/Chief Complaint:  Had nausea/vomiting after NG clamp trial yesterday. NG 1300 cc/24h, pt did drink some apple juice/water/ice. Having a small amt flatus. Reports a small BM this morning. Walked in hall yesterday.    Objective: Vital signs in last 24 hours: Temp:  [98 F (36.7 C)-98.4 F (36.9 C)] 98.3 F (36.8 C) (11/20 0605) Pulse Rate:  [96-107] 96 (11/20 0605) Resp:  [16-18] 18 (11/20 0605) BP: (129-145)/(90-94) 129/94 (11/20 0605) SpO2:  [95 %-97 %] 97 % (11/20 0605) Last BM Date : 06/25/22  Intake/Output from previous day: 11/19 0701 - 11/20 0700 In: 1907.8 [P.O.:240; I.V.:1522.7; IV Piggyback:145.1] Out: 1300 [Emesis/NG output:1300] Intake/Output this shift: No intake/output data recorded.  Gen:  Alert, NAD Abd: mild to moderate distention, minimal diffuse tenderness, incisions cdi, NG in place with brown/clear effluent  Lab Results:  Recent Labs    06/25/22 0046 06/26/22 0501  WBC 19.3* 24.7*  HGB 12.3* 12.1*  HCT 37.1* 38.0*  PLT 507* 558*   BMET Recent Labs    06/25/22 0046 06/26/22 0501  NA 145 147*  K 3.6 3.2*  CL 113* 111  CO2 27 29  GLUCOSE 126* 136*  BUN 15 17  CREATININE 0.72 0.84  CALCIUM 8.3* 8.2*   PT/INR No results for input(s): "LABPROT", "INR" in the last 72 hours. ABG No results for input(s): "PHART", "HCO3" in the last 72 hours.  Invalid input(s): "PCO2", "PO2"  Studies/Results: VAS [place] LOWER EXTREMITY VENOUS (DVT)  Result Date: 06/25/2022  Lower Venous DVT Study Patient Name:  Cameron Rice  Date of Exam:   06/25/2022 Medical Rec #: [id number]         Accession #:    [id number] Date of Birth: [birth year]         Patient Gender: M Patient Age:   71 years Exam Location:  [facility] Procedure:      VAS [place] LOWER EXTREMITY VENOUS (DVT) Referring Phys: [place] --------------------------------------------------------------------------------  Indications: Pain.  Comparison Study: No previous exams  Performing Technologist: Jody Hill RVT, RDMS  Examination Guidelines: A complete evaluation includes B-mode imaging, spectral Doppler, color Doppler, and power Doppler as needed of all accessible portions of each vessel. Bilateral testing is considered an integral part of a complete examination. Limited examinations for reoccurring indications may be performed as noted. The reflux portion of the exam is performed with the patient in reverse Trendelenburg.  +---------+---------------+---------+-----------+----------+--------------+ RIGHT    CompressibilityPhasicitySpontaneityPropertiesThrombus Aging +---------+---------------+---------+-----------+----------+--------------+ CFV      Full           Yes      Yes                                 +---------+---------------+---------+-----------+----------+--------------+ SFJ      Full                                                        +---------+---------------+---------+-----------+----------+--------------+ FV Prox  Full           Yes      Yes                                 +---------+---------------+---------+-----------+----------+--------------+ FV  Mid   Full           Yes      Yes                                 +---------+---------------+---------+-----------+----------+--------------+ FV DistalFull           Yes      Yes                                 +---------+---------------+---------+-----------+----------+--------------+ PFV      Full                                                        +---------+---------------+---------+-----------+----------+--------------+ POP      Full           Yes      Yes                                 +---------+---------------+---------+-----------+----------+--------------+ PTV      Full                                                        +---------+---------------+---------+-----------+----------+--------------+ PERO     Full                                                         +---------+---------------+---------+-----------+----------+--------------+   +---------+---------------+---------+-----------+----------+--------------+ LEFT     CompressibilityPhasicitySpontaneityPropertiesThrombus Aging +---------+---------------+---------+-----------+----------+--------------+ CFV      Full           Yes      Yes                                 +---------+---------------+---------+-----------+----------+--------------+ SFJ      Full                                                        +---------+---------------+---------+-----------+----------+--------------+ FV Prox  Full           Yes      Yes                                 +---------+---------------+---------+-----------+----------+--------------+ FV Mid   Full           Yes      Yes                                 +---------+---------------+---------+-----------+----------+--------------+ FV DistalFull  Yes      Yes                                 +---------+---------------+---------+-----------+----------+--------------+ PFV      Full                                                        +---------+---------------+---------+-----------+----------+--------------+ POP      Full           Yes      Yes                                 +---------+---------------+---------+-----------+----------+--------------+ PTV      Full                                                        +---------+---------------+---------+-----------+----------+--------------+ PERO     Full                                                        +---------+---------------+---------+-----------+----------+--------------+     Summary: BILATERAL: - No evidence of deep vein thrombosis seen in the lower extremities, bilaterally. -No evidence of popliteal cyst, bilaterally.   *See table(s) above for measurements and observations. Electronically signed by Deitra Mayo MD on 06/25/2022 at 7:09:32 PM.    Final    CT CHEST W CONTRAST  Result Date: 06/24/2022 CLINICAL DATA:  Oncologic staging. Patient with peritoneal carcinomatosis with unclear primary source. EXAM: CT CHEST, ABDOMEN, AND PELVIS WITH CONTRAST TECHNIQUE: Multidetector CT imaging of the chest, abdomen and pelvis was performed following the standard protocol during bolus administration of intravenous contrast. RADIATION DOSE REDUCTION: This exam was performed according to the departmental dose-optimization program which includes automated exposure control, adjustment of the mA and/or kV according to patient size and/or use of iterative reconstruction technique. CONTRAST:  180m OMNIPAQUE IOHEXOL 300 MG/ML  SOLN COMPARISON:  CT abdomen and pelvis dated 06/08/2022, CT abdomen dated 02/03/2009 FINDINGS: CT CHEST FINDINGS Cardiovascular: Normal heart size. No significant pericardial fluid/thickening. Coronary artery calcifications. Great vessels are normal in course and caliber. Filling defect within the proximal superior lingular artery (504:171) appears eccentric along the superior aspect of the vessel. Mediastinum/Nodes: thyroid gland without nodules meeting criteria for imaging follow-up by size. Nonspecific mural thickening of the distal esophagus. No pathologically enlarged axillary, supraclavicular, mediastinal, or hilar lymph nodes. Lungs/Pleura: The central airways are patent. There are secretions within the trachea and bilateral proximal main bronchi. Bibasilar subsegmental atelectasis. Accessory azygous fissure. 2 mm left lower lobe nodule (505:86) is unchanged from 20/10, likely benign. No focal consolidation. No pneumothorax. Similar small left and trace right pleural effusions. Musculoskeletal: No acute or abnormal lytic or blastic osseous lesions. CT ABDOMEN PELVIS FINDINGS Hepatobiliary: No focal hepatic lesions. No intra or extrahepatic biliary ductal dilation. Normal gallbladder. Pancreas: 5  mm hypodensity in the pancreatic neck (  5:65) no main ductal dilation. Spleen: Normal in size without focal abnormality. Adrenals/Urinary Tract: No adrenal nodules. No suspicious renal mass, calculi, or hydronephrosis. Left interpolar simple/minimally complicated cyst measuring 1.4 cm. Additional subcentimeter hypodensities, too small to characterize. No focal bladder wall thickening. Stomach/Bowel: Enteric tube terminates in the stomach. Nonspecific mural thickening of the gastric antrum/pylorus, which may be related to underdistention. Mildly dilated proximal small bowel loops with gradual caliber transition to the level of the lower abdomen, where there are small bowel loops with irregular mural thickening, for example series 2, image 100, likely serosal implants. normal appendix. Vascular/Lymphatic: Aortic atherosclerosis. Slightly asymmetrically expanded appearance of the right common iliac vein (2:99) with central rounded hypoattenuation extending to the level of the infrarenal IVC, which persists on the delayed images (7:37). 10 mm peripancreatic lymph node (2:60). Reproductive: Mildly enlarged prostate gland. Other: Decreased small volume ascites. Similar irregular thickening and nodularity of the peritoneum and omentum. Trace pneumoperitoneum, likely postsurgical. Musculoskeletal: No acute or abnormal lytic or blastic osseous findings. Diffuse subcutaneous edema. Trace subcutaneous emphysema, likely postsurgical. Small fat-containing left inguinal hernia. IMPRESSION: 1. Pulmonary embolus within the proximal superior lingular artery appears eccentric, suggesting a degree of chronicity. 2. Slightly asymmetrically expanded appearance of the right common iliac vein with central rounded hypoattenuation extending to the level of the infrarenal IVC, which persists on the delayed images, suspicious for deep venous thrombosis. 3. Similar irregular thickening and nodularity of the peritoneum and omentum in keeping  with peritoneal carcinomatosis. 4. Mildly dilated proximal small bowel loops with gradual caliber transition to the level of the lower abdomen, where there are small bowel loops with irregular mural thickening, likely serosal implants resulting in partial bowel obstruction/ileus. 5. Nonspecific mural thickening of the distal esophagus and gastric antrum/pylorus, which may be related to underdistention. Consider endoscopic evaluation. 6. A 10 mm peripancreatic lymph node is suspicious for metastasis. 7. Decreased small volume ascites. 8. Similar small left and trace right pleural effusions. 9. A 5 mm hypodensity in the pancreatic neck, possibly a small side branch IPMN. Recommend attention on follow-up. 10. Coronary artery calcifications. Aortic Atherosclerosis (ICD10-I70.0). Critical Value/emergent results were called by telephone at the time of interpretation on 06/24/2022 at 4:05 pm to provider Antonieta Pert, who verbally acknowledged these results. Electronically Signed   By: Darrin Nipper M.D.   On: 06/24/2022 16:07   CT ABDOMEN PELVIS W CONTRAST  Result Date: 06/24/2022 CLINICAL DATA:  Oncologic staging. Patient with peritoneal carcinomatosis with unclear primary source. EXAM: CT CHEST, ABDOMEN, AND PELVIS WITH CONTRAST TECHNIQUE: Multidetector CT imaging of the chest, abdomen and pelvis was performed following the standard protocol during bolus administration of intravenous contrast. RADIATION DOSE REDUCTION: This exam was performed according to the departmental dose-optimization program which includes automated exposure control, adjustment of the mA and/or kV according to patient size and/or use of iterative reconstruction technique. CONTRAST:  116m OMNIPAQUE IOHEXOL 300 MG/ML  SOLN COMPARISON:  CT abdomen and pelvis dated 06/20/2022, CT abdomen dated 02/03/2009 FINDINGS: CT CHEST FINDINGS Cardiovascular: Normal heart size. No significant pericardial fluid/thickening. Coronary artery calcifications. Great  vessels are normal in course and caliber. Filling defect within the proximal superior lingular artery (504:171) appears eccentric along the superior aspect of the vessel. Mediastinum/Nodes: thyroid gland without nodules meeting criteria for imaging follow-up by size. Nonspecific mural thickening of the distal esophagus. No pathologically enlarged axillary, supraclavicular, mediastinal, or hilar lymph nodes. Lungs/Pleura: The central airways are patent. There are secretions within the trachea and bilateral proximal main bronchi.  Bibasilar subsegmental atelectasis. Accessory azygous fissure. 2 mm left lower lobe nodule (505:86) is unchanged from 20/10, likely benign. No focal consolidation. No pneumothorax. Similar small left and trace right pleural effusions. Musculoskeletal: No acute or abnormal lytic or blastic osseous lesions. CT ABDOMEN PELVIS FINDINGS Hepatobiliary: No focal hepatic lesions. No intra or extrahepatic biliary ductal dilation. Normal gallbladder. Pancreas: 5 mm hypodensity in the pancreatic neck (5:65) no main ductal dilation. Spleen: Normal in size without focal abnormality. Adrenals/Urinary Tract: No adrenal nodules. No suspicious renal mass, calculi, or hydronephrosis. Left interpolar simple/minimally complicated cyst measuring 1.4 cm. Additional subcentimeter hypodensities, too small to characterize. No focal bladder wall thickening. Stomach/Bowel: Enteric tube terminates in the stomach. Nonspecific mural thickening of the gastric antrum/pylorus, which may be related to underdistention. Mildly dilated proximal small bowel loops with gradual caliber transition to the level of the lower abdomen, where there are small bowel loops with irregular mural thickening, for example series 2, image 100, likely serosal implants. normal appendix. Vascular/Lymphatic: Aortic atherosclerosis. Slightly asymmetrically expanded appearance of the right common iliac vein (2:99) with central rounded hypoattenuation  extending to the level of the infrarenal IVC, which persists on the delayed images (7:37). 10 mm peripancreatic lymph node (2:60). Reproductive: Mildly enlarged prostate gland. Other: Decreased small volume ascites. Similar irregular thickening and nodularity of the peritoneum and omentum. Trace pneumoperitoneum, likely postsurgical. Musculoskeletal: No acute or abnormal lytic or blastic osseous findings. Diffuse subcutaneous edema. Trace subcutaneous emphysema, likely postsurgical. Small fat-containing left inguinal hernia. IMPRESSION: 1. Pulmonary embolus within the proximal superior lingular artery appears eccentric, suggesting a degree of chronicity. 2. Slightly asymmetrically expanded appearance of the right common iliac vein with central rounded hypoattenuation extending to the level of the infrarenal IVC, which persists on the delayed images, suspicious for deep venous thrombosis. 3. Similar irregular thickening and nodularity of the peritoneum and omentum in keeping with peritoneal carcinomatosis. 4. Mildly dilated proximal small bowel loops with gradual caliber transition to the level of the lower abdomen, where there are small bowel loops with irregular mural thickening, likely serosal implants resulting in partial bowel obstruction/ileus. 5. Nonspecific mural thickening of the distal esophagus and gastric antrum/pylorus, which may be related to underdistention. Consider endoscopic evaluation. 6. A 10 mm peripancreatic lymph node is suspicious for metastasis. 7. Decreased small volume ascites. 8. Similar small left and trace right pleural effusions. 9. A 5 mm hypodensity in the pancreatic neck, possibly a small side branch IPMN. Recommend attention on follow-up. 10. Coronary artery calcifications. Aortic Atherosclerosis (ICD10-I70.0). Critical Value/emergent results were called by telephone at the time of interpretation on 06/24/2022 at 4:05 pm to provider Antonieta Pert, who verbally acknowledged these  results. Electronically Signed   By: Darrin Nipper M.D.   On: 06/24/2022 16:07    Anti-infectives: Anti-infectives (From admission, onward)    Start     Dose/Rate Route Frequency Ordered Stop   06/17/2022 0200  piperacillin-tazobactam (ZOSYN) IVPB 3.375 g       See Hyperspace for full Linked Orders Report.   3.375 g 12.5 mL/hr over 240 Minutes Intravenous Every 8 hours 06/11/2022 1849     06/18/2022 1900  piperacillin-tazobactam (ZOSYN) IVPB 3.375 g       See Hyperspace for full Linked Orders Report.   3.375 g 100 mL/hr over 30 Minutes Intravenous  Once 07/02/2022 1849 06/20/2022 1940       Assessment/Plan: Post operative abdominal pain with ascites after umbilical hernia repair with mesh by Dr. Rosendo Gros 06/08/22  -POD#4 s/p LAPAROSCOPY DIAGNOSTIC,  BIOPSY OF OMENTAL AND PERITONEAL NODULES, ASPIRATION OF ASCITES FOR CYTOLOGY 11/16 Dr. Ninfa Linden - intra-op findings: The patient was found to have what appeared to be diffuse intra-abdominal carcinomatosis with studding of the small and large bowel, omentum, and peritoneum as well as at the level of the diaphragm. We obtained biopsies of the omentum and peritoneal nodules as well as aspirated fluid for cytology.  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 The source of malignancy cannot be easily identified at the time of surgery - pathology and cytology pending. CEA normal/ CA 19-9 normal - CT C/A/P 11/18 showed chronic PE and right common iliac DVT - now on heparin gtt with no sign of bleeding CT showed evidence of peritoneal carcinomatosis but no obvious sign of primary tumor.  No pulmonary tumor or mets noted.  -  Will also need oncology consult during hospitalization.  - failed NG clamp trial, distended today and minimal flatus but did have a BM. Leave NG to LIWS today. Allow ice chips/small sips of water/a popsicle is ok. May re-attempt clamp trial tomorrow. - await path/cyto  - Mobilize   ID - zosyn 11/15>>; WBC 24 today, in the setting  of malignancy and known DVT. pt afebrile. Monitor. IS ordered. could consider UA/Cx given hx recurrent UTI but he is asymptomatic.  FEN - IVF, NG LIWS, sips/chips VTE - lovenox Foley - d/c 11/17 once mobilizing   Transaminitis - trending down HTN HLD BPH Hx UTI    Dr. Georgette Dover had a  long discussion with the patient and his wife on 11/18.  They requested that he be transferred to [facility] for his cancer treatment.  I certainly think that would be a good idea to refer him there as an outpatient to be considered for HIPEC, but it is doubtful that they would accept a transfer for a post-op ileus.  We will try to get him through this hospitalization and then he can be seen at the [facility] cancer center for evaluation.  We will begin his staging with CT chest/ abd/pelvis over the weekend and a consultation with oncology here.   LOS: 5 days    Jill Alexanders 06/26/2022

## 2022-06-26 NOTE — Progress Notes (Signed)
ANTICOAGULATION CONSULT NOTE - Follow Up Consult  Pharmacy Consult for Heparin Indication: pulmonary embolus  No Known Allergies  Patient Measurements: Height: '5\' 11"'$  (180.3 cm) Weight: 78 kg (171 lb 15.3 oz) IBW/kg (Calculated) : 75.3 Heparin Dosing Weight: 78 kg  Vital Signs: Temp: 98.3 F (36.8 C) (11/20 0605) Temp Source: Oral (11/20 0605) BP: 129/94 (11/20 0605) Pulse Rate: 96 (11/20 0605)  Labs: Recent Labs    06/24/22 0524 06/25/22 0046 06/25/22 1000 06/26/22 0501  HGB 11.7* 12.3*  --  12.1*  HCT 36.8* 37.1*  --  38.0*  PLT 468* 507*  --  558*  HEPARINUNFRC  --  0.57 0.45 0.33  CREATININE 0.77 0.72  --  0.84    Estimated Creatinine Clearance: 88.4 mL/min (by C-G formula based on SCr of 0.84 mg/dL).  Assessment: AC/Heme: heparin drip for new PE on CT. Dopplers negative for DVT. - Hep level 0.33 trending down, Hgb 12.1 stable. Plts elevated  Goal of Therapy:  Heparin level 0.3-0.7 units/ml Monitor platelets by anticoagulation protocol: Yes   Plan:  Increase heparin slightly to 1400 units/hr to prevent downward trend Daily HL and CBC   Cameron Rice S. Alford Highland, PharmD, BCPS Clinical Staff Pharmacist [email or web address] Alford Highland, [employer] 06/26/2022,8:39 AM

## 2022-06-26 NOTE — Progress Notes (Signed)
NG tube advanced 18 cm more as per radiology. Patient tolerated and was given pain medicine. We will order X ray for confirmation of placement.

## 2022-06-26 NOTE — Progress Notes (Signed)
Mobility Specialist - Progress Note   06/26/22 1609  Mobility  Activity Ambulated with assistance in hallway  Level of Assistance Modified independent, requires aide device or extra time  Assistive Device  (IV Pole)  Distance Ambulated (ft) 600 ft  Activity Response Tolerated well  Mobility Referral Yes  $Mobility charge 1 Mobility   Pt received in bed and agreeable to mobility. No complaints during mobility. Pt to EOB after session with all needs met.      [name]  Mobility Specialist  

## 2022-06-27 ENCOUNTER — Inpatient Hospital Stay: Payer: Self-pay

## 2022-06-27 DIAGNOSIS — K56609 Unspecified intestinal obstruction, unspecified as to partial versus complete obstruction: Secondary | ICD-10-CM | POA: Diagnosis not present

## 2022-06-27 LAB — CULTURE, BLOOD (ROUTINE X 2)
Culture: NO GROWTH
Culture: NO GROWTH

## 2022-06-27 LAB — CBC WITH DIFFERENTIAL/PLATELET
Abs Immature Granulocytes: 0.24 10*3/uL — ABNORMAL HIGH (ref 0.00–0.07)
Basophils Absolute: 0.1 10*3/uL (ref 0.0–0.1)
Basophils Relative: 0 %
Eosinophils Absolute: 0 10*3/uL (ref 0.0–0.5)
Eosinophils Relative: 0 %
HCT: 40.1 % (ref 39.0–52.0)
Hemoglobin: 12.7 g/dL — ABNORMAL LOW (ref 13.0–17.0)
Immature Granulocytes: 1 %
Lymphocytes Relative: 4 %
Lymphs Abs: 1.1 10*3/uL (ref 0.7–4.0)
MCH: 30 pg (ref 26.0–34.0)
MCHC: 31.7 g/dL (ref 30.0–36.0)
MCV: 94.8 fL (ref 80.0–100.0)
Monocytes Absolute: 1.4 10*3/uL — ABNORMAL HIGH (ref 0.1–1.0)
Monocytes Relative: 5 %
Neutro Abs: 22.6 10*3/uL — ABNORMAL HIGH (ref 1.7–7.7)
Neutrophils Relative %: 90 %
Platelets: 648 10*3/uL — ABNORMAL HIGH (ref 150–400)
RBC: 4.23 MIL/uL (ref 4.22–5.81)
RDW: 13.5 % (ref 11.5–15.5)
WBC: 25.4 10*3/uL — ABNORMAL HIGH (ref 4.0–10.5)
nRBC: 0.1 % (ref 0.0–0.2)

## 2022-06-27 LAB — SURGICAL PATHOLOGY

## 2022-06-27 LAB — COMPREHENSIVE METABOLIC PANEL
ALT: 65 U/L — ABNORMAL HIGH (ref 0–44)
AST: 30 U/L (ref 15–41)
Albumin: 2.2 g/dL — ABNORMAL LOW (ref 3.5–5.0)
Alkaline Phosphatase: 143 U/L — ABNORMAL HIGH (ref 38–126)
Anion gap: 9 (ref 5–15)
BUN: 20 mg/dL (ref 8–23)
CO2: 29 mmol/L (ref 22–32)
Calcium: 8.3 mg/dL — ABNORMAL LOW (ref 8.9–10.3)
Chloride: 107 mmol/L (ref 98–111)
Creatinine, Ser: 0.85 mg/dL (ref 0.61–1.24)
GFR, Estimated: >60 mL/min (ref >60)
Glucose, Bld: 122 mg/dL — ABNORMAL HIGH (ref 70–99)
Potassium: 3.3 mmol/L — ABNORMAL LOW (ref 3.5–5.1)
Sodium: 145 mmol/L (ref 135–145)
Total Bilirubin: 1.3 mg/dL — ABNORMAL HIGH (ref 0.3–1.2)
Total Protein: 6.6 g/dL (ref 6.5–8.1)

## 2022-06-27 LAB — HEPARIN LEVEL (UNFRACTIONATED)
Heparin Unfractionated: 0.2 IU/mL — ABNORMAL LOW (ref 0.30–0.70)
Heparin Unfractionated: 0.27 IU/mL — ABNORMAL LOW (ref 0.30–0.70)

## 2022-06-27 LAB — HEMOGLOBIN A1C
Hgb A1c MFr Bld: 4.8 % (ref 4.8–5.6)
Mean Plasma Glucose: 91.06 mg/dL

## 2022-06-27 LAB — MAGNESIUM: Magnesium: 2.5 mg/dL — ABNORMAL HIGH (ref 1.7–2.4)

## 2022-06-27 LAB — GLUCOSE, CAPILLARY: Glucose-Capillary: 170 mg/dL — ABNORMAL HIGH (ref 70–99)

## 2022-06-27 MED ORDER — POTASSIUM CHLORIDE 10 MEQ/100ML IV SOLN
10.0000 meq | INTRAVENOUS | Status: AC
  Administered 2022-06-27 (×2): 10 meq via INTRAVENOUS
  Filled 2022-06-27: qty 100

## 2022-06-27 MED ORDER — POTASSIUM CL IN DEXTROSE 5% 20 MEQ/L IV SOLN
20.0000 meq | INTRAVENOUS | Status: AC
  Administered 2022-06-27: 20 meq via INTRAVENOUS
  Filled 2022-06-27 (×2): qty 1000

## 2022-06-27 MED ORDER — TRAVASOL 10 % IV SOLN
INTRAVENOUS | Status: AC
  Filled 2022-06-27: qty 528

## 2022-06-27 MED ORDER — CHLORHEXIDINE GLUCONATE CLOTH 2 % EX PADS
6.0000 | MEDICATED_PAD | Freq: Every day | CUTANEOUS | Status: DC
  Administered 2022-06-27 – 2022-07-14 (×15): 6 via TOPICAL

## 2022-06-27 MED ORDER — INSULIN ASPART 100 UNIT/ML IJ SOLN
0.0000 [IU] | INTRAMUSCULAR | Status: DC
  Administered 2022-06-27: 3 [IU] via SUBCUTANEOUS
  Administered 2022-06-28 (×3): 2 [IU] via SUBCUTANEOUS
  Administered 2022-06-28: 3 [IU] via SUBCUTANEOUS
  Administered 2022-06-28: 2 [IU] via SUBCUTANEOUS
  Administered 2022-06-28 – 2022-06-29 (×3): 3 [IU] via SUBCUTANEOUS
  Administered 2022-06-29: 2 [IU] via SUBCUTANEOUS
  Administered 2022-06-29 (×2): 3 [IU] via SUBCUTANEOUS
  Administered 2022-06-29: 2 [IU] via SUBCUTANEOUS
  Administered 2022-06-30: 3 [IU] via SUBCUTANEOUS
  Administered 2022-06-30 (×3): 2 [IU] via SUBCUTANEOUS
  Administered 2022-06-30 (×2): 3 [IU] via SUBCUTANEOUS
  Administered 2022-07-01 (×5): 2 [IU] via SUBCUTANEOUS
  Administered 2022-07-02 (×2): 3 [IU] via SUBCUTANEOUS
  Administered 2022-07-02: 2 [IU] via SUBCUTANEOUS
  Administered 2022-07-02: 3 [IU] via SUBCUTANEOUS
  Administered 2022-07-02 (×2): 2 [IU] via SUBCUTANEOUS
  Administered 2022-07-03: 3 [IU] via SUBCUTANEOUS
  Administered 2022-07-03 – 2022-07-04 (×5): 2 [IU] via SUBCUTANEOUS
  Administered 2022-07-04: 5 [IU] via SUBCUTANEOUS
  Administered 2022-07-04: 11 [IU] via SUBCUTANEOUS
  Administered 2022-07-04: 2 [IU] via SUBCUTANEOUS
  Administered 2022-07-04: 5 [IU] via SUBCUTANEOUS
  Administered 2022-07-04: 3 [IU] via SUBCUTANEOUS
  Administered 2022-07-05: 5 [IU] via SUBCUTANEOUS
  Administered 2022-07-05: 3 [IU] via SUBCUTANEOUS

## 2022-06-27 NOTE — Progress Notes (Signed)
NG tube advanced 6 cm more as per radiology.

## 2022-06-27 NOTE — Progress Notes (Signed)
Pathology resulted today:  FINAL MICROSCOPIC DIAGNOSIS:  A.   OMENTUM, EXCISION: -    Metastatic adenocarcinoma, moderately differentiated.  B.   PERITONEAL NODULES, EXCISION: -    Metastatic adenocarcinoma, moderately differentiated.  C.   OMENTUM, EXCISION: -    Metastatic adenocarcinoma, moderately differentiated.  COMMENT:  The adenocarcinoma was interrogated with immunohistochemical (IHC) stains (CK7, CK20, CDX2, TTF-1, PAX8, Napsin A).   The adenocarcinoma is CK7 positive and has weak to moderate PAX8 reactivity.   The tumor is non-reactive with all other immunomarkers.  This immunoprofile is not specific as to a primary site.  Notable, the lung markers (TTF-1 and Napsin A are negative).  Although, generally a renal and Mullerian marker, PAX8 reactivity has been described in gastric carcinoma.  The IHC stains have satisfactory controls.    I printed a copy of the results and delivered them to the family this afternoon    Obie Dredge, [facility] Please see Amion for pager number during day hours 7:00am-4:30pm

## 2022-06-27 NOTE — Progress Notes (Signed)
5 Days Post-Op   Subjective/Chief Complaint:  Had nausea/vomiting after NG clamp trial yesterday. NG 1300 cc/24h, pt did drink some apple juice/water/ice. Having a small amt flatus. Reports a small BM this morning. Walked in hall yesterday.    Objective: Vital signs in last 24 hours: Temp:  [97.5 F (36.4 C)-98.5 F (36.9 C)] 97.5 F (36.4 C) (11/21 0525) Pulse Rate:  [96-101] 101 (11/21 0525) Resp:  [18] 18 (11/21 0525) BP: (134-137)/(92-98) 137/98 (11/21 0525) SpO2:  [95 %-96 %] 95 % (11/21 0525) Last BM Date : 06/25/22  Intake/Output from previous day: 11/20 0701 - 11/21 0700 In: 1448.1 [I.V.:948.2; IV Piggyback:499.9] Out: 1850 [Urine:500; Emesis/NG output:1350] Intake/Output this shift: No intake/output data recorded.  Gen:  Alert, NAD Abd: mild to moderate distention, minimal diffuse tenderness, incisions cdi, NG in place with brown/clear effluent  Lab Results:  Recent Labs    06/26/22 0501 06/27/22 0433  WBC 24.7* 25.4*  HGB 12.1* 12.7*  HCT 38.0* 40.1  PLT 558* 648*   BMET Recent Labs    06/26/22 0501 06/27/22 0433  NA 147* 145  K 3.2* 3.3*  CL 111 107  CO2 29 29  GLUCOSE 136* 122*  BUN 17 20  CREATININE 0.84 0.85  CALCIUM 8.2* 8.3*   PT/INR No results for input(s): "LABPROT", "INR" in the last 72 hours. ABG No results for input(s): "PHART", "HCO3" in the last 72 hours.  Invalid input(s): "PCO2", "PO2"  Studies/Results: [place] EKG SITE RITE  Result Date: 06/27/2022 If Site Rite image not attached, placement could not be confirmed due to current cardiac rhythm.  DG Abd Portable 1V  Result Date: 06/26/2022 CLINICAL DATA:  Nasogastric tube placement. EXAM: PORTABLE ABDOMEN - 1 VIEW COMPARISON:  Radiograph earlier today. FINDINGS: The enteric tube has been advanced, the tip is now below the diaphragm in the stomach. The side-port is in the distal esophagus. Advancement of at least 6 cm would place the side-port below the diaphragm. Enteric contrast  in the colon from prior CT. IMPRESSION: Tip of the enteric tube now below the diaphragm in the stomach, side-port in the distal esophagus. Advancement of at least 6 cm would place the side-port below the diaphragm. Electronically Signed   By: Keith Rake M.D.   On: 06/26/2022 21:13   DG Abd 1 View  Result Date: 06/26/2022 CLINICAL DATA:  Confirm NG tube placement. EXAM: ABDOMEN - 1 VIEW COMPARISON:  06/23/2022. FINDINGS: The bowel gas pattern is normal. Retained contrast is present in the colon. A nasogastric tube terminates in the mid esophagus and should be advanced proximally 18 cm. Mild atelectasis is noted at the lung bases. No radio-opaque calculi or other acute radiographic abnormality are seen. IMPRESSION: Nasogastric tube terminates in the mid esophagus and should be advanced approximately 18 cm. Electronically Signed   By: Brett Fairy M.D.   On: 06/26/2022 20:04   VAS [place] LOWER EXTREMITY VENOUS (DVT)  Result Date: 06/25/2022  Lower Venous DVT Study Patient Name:  Daruis L [place]  Date of Exam:   06/25/2022 Medical Rec #: [id number]         Accession #:    [id number] Date of Birth: 10/02/1950         Patient Gender: M Patient Age:   71 years Exam Location:  [facility] Procedure:      VAS [place] LOWER EXTREMITY VENOUS (DVT) Referring Phys: [place] --------------------------------------------------------------------------------  Indications: Pain.  Comparison Study: No previous exams Performing Technologist: Jody Hill RVT, RDMS  Examination Guidelines: A complete evaluation includes B-mode imaging, spectral Doppler, color Doppler, and power Doppler as needed of all accessible portions of each vessel. Bilateral testing is considered an integral part of a complete examination. Limited examinations for reoccurring indications may be performed as noted. The reflux portion of the exam is performed with the patient in reverse Trendelenburg.   +---------+---------------+---------+-----------+----------+--------------+ RIGHT    CompressibilityPhasicitySpontaneityPropertiesThrombus Aging +---------+---------------+---------+-----------+----------+--------------+ CFV      Full           Yes      Yes                                 +---------+---------------+---------+-----------+----------+--------------+ SFJ      Full                                                        +---------+---------------+---------+-----------+----------+--------------+ FV Prox  Full           Yes      Yes                                 +---------+---------------+---------+-----------+----------+--------------+ FV Mid   Full           Yes      Yes                                 +---------+---------------+---------+-----------+----------+--------------+ FV DistalFull           Yes      Yes                                 +---------+---------------+---------+-----------+----------+--------------+ PFV      Full                                                        +---------+---------------+---------+-----------+----------+--------------+ POP      Full           Yes      Yes                                 +---------+---------------+---------+-----------+----------+--------------+ PTV      Full                                                        +---------+---------------+---------+-----------+----------+--------------+ PERO     Full                                                        +---------+---------------+---------+-----------+----------+--------------+   +---------+---------------+---------+-----------+----------+--------------+ LEFT  CompressibilityPhasicitySpontaneityPropertiesThrombus Aging +---------+---------------+---------+-----------+----------+--------------+ CFV      Full           Yes      Yes                                  +---------+---------------+---------+-----------+----------+--------------+ SFJ      Full                                                        +---------+---------------+---------+-----------+----------+--------------+ FV Prox  Full           Yes      Yes                                 +---------+---------------+---------+-----------+----------+--------------+ FV Mid   Full           Yes      Yes                                 +---------+---------------+---------+-----------+----------+--------------+ FV DistalFull           Yes      Yes                                 +---------+---------------+---------+-----------+----------+--------------+ PFV      Full                                                        +---------+---------------+---------+-----------+----------+--------------+ POP      Full           Yes      Yes                                 +---------+---------------+---------+-----------+----------+--------------+ PTV      Full                                                        +---------+---------------+---------+-----------+----------+--------------+ PERO     Full                                                        +---------+---------------+---------+-----------+----------+--------------+     Summary: BILATERAL: - No evidence of deep vein thrombosis seen in the lower extremities, bilaterally. -No evidence of popliteal cyst, bilaterally.   *See table(s) above for measurements and observations. Electronically signed by Deitra Mayo MD on 06/25/2022 at 7:09:32 PM.    Final     Anti-infectives: Anti-infectives (From admission, onward)    Start     Dose/Rate Route Frequency Ordered  Stop   06/14/2022 0200  piperacillin-tazobactam (ZOSYN) IVPB 3.375 g       See Hyperspace for full Linked Orders Report.   3.375 g 12.5 mL/hr over 240 Minutes Intravenous Every 8 hours 06/15/2022 1849     07/04/2022 1900  piperacillin-tazobactam  (ZOSYN) IVPB 3.375 g       See Hyperspace for full Linked Orders Report.   3.375 g 100 mL/hr over 30 Minutes Intravenous  Once 07/02/2022 1849 06/11/2022 1940       Assessment/Plan: Post operative abdominal pain with ascites after umbilical hernia repair with mesh by Dr. Rosendo Gros 06/08/22  -POD#5 s/p LAPAROSCOPY DIAGNOSTIC, BIOPSY OF OMENTAL AND PERITONEAL NODULES, ASPIRATION OF ASCITES FOR CYTOLOGY 11/16 Dr. Ninfa Linden - intra-op findings: The patient was found to have what appeared to be diffuse intra-abdominal carcinomatosis with studding of the small and large bowel, omentum, and peritoneum as well as at the level of the diaphragm. We obtained biopsies of the omentum and peritoneal nodules as well as aspirated fluid for cytology.  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 The source of malignancy cannot be easily identified at the time of surgery - pathology and cytology pending. CEA normal/ CA 19-9 normal - CT C/A/P 11/18 showed chronic PE and right common iliac DVT - now on heparin gtt with no sign of bleeding CT showed evidence of peritoneal carcinomatosis but no obvious sign of primary tumor.  No pulmonary tumor or mets noted.  -  oncology saw pt 11/20 and following - he has an ileus, continue NG to LIWS. Start TPN given prolonged NPO status and concern for malignancy - await path/cyto  - Mobilize   ID - zosyn 11/15>>; WBC 25 today, in the setting of malignancy and known DVT. pt afebrile. Monitor. IS ordered. could consider UA/Cx given hx recurrent UTI but he is asymptomatic.  FEN - IVF, NG LIWS, sips/chips VTE - lovenox Foley - d/c 11/17 once mobilizing   Transaminitis - trending down HTN HLD BPH Hx UTI    Dr. Georgette Dover had a  long discussion with the patient and his wife on 11/18.  They requested that he be transferred to [facility] for his cancer treatment.  I certainly think that would be a good idea to refer him there as an outpatient to be considered for HIPEC, but it  is doubtful that they would accept a transfer for a post-op ileus.  We will try to get him through this hospitalization and then he can be seen at the [facility] cancer center for evaluation.  We will begin his staging with CT chest/ abd/pelvis over the weekend and a consultation with oncology here.   LOS: 6 days    Jill Alexanders 06/27/2022

## 2022-06-27 NOTE — Progress Notes (Signed)
ANTICOAGULATION CONSULT NOTE - Follow Up Consult  Pharmacy Consult for Heparin Indication: pulmonary embolus  No Known Allergies  Patient Measurements: Height: '5\' 11"'$  (180.3 cm) Weight: 78 kg (171 lb 15.3 oz) IBW/kg (Calculated) : 75.3 Heparin Dosing Weight: 78 kg  Vital Signs: Temp: 98.6 F (37 C) (11/21 1445) Temp Source: Oral (11/21 1445) BP: 129/94 (11/21 1445) Pulse Rate: 100 (11/21 1445)  Labs: Recent Labs    06/25/22 0046 06/25/22 1000 06/26/22 0501 06/27/22 0433 06/27/22 1547  HGB 12.3*  --  12.1* 12.7*  --   HCT 37.1*  --  38.0* 40.1  --   PLT 507*  --  558* 648*  --   HEPARINUNFRC 0.57   < > 0.33 0.20* 0.27*  CREATININE 0.72  --  0.84 0.85  --    < > = values in this interval not displayed.     Estimated Creatinine Clearance: 87.4 mL/min (by C-G formula based on SCr of 0.85 mg/dL).  Assessment: Pt is a 66 yoM who underwent diagnostic laparoscopy with biopsy of omental and peritoneal nodules on 11/16 (AET 1541). Pt s/p umbilical hernia repair on 06/08/22.    CT Abdomen (11/18): +PE Venous doppler (11/18): negative bilaterally for DVT  Pharmacy consulted to dose heparin for VTE treatment.   06/27/2022:  Heparin level 0.27 - subtherapeutic on IV heparin 1550 units/hr   Patient s/p PICC placement for TPN Heparin running in peripheral IV No bleeding or infusion issues noted per RN  Goal of Therapy:  Heparin level 0.3-0.7 units/ml Monitor platelets by anticoagulation protocol: Yes   Plan:  -Increase heparin to 1700 units/hr  -Recheck heparin level in ~ 6 hr -Daily HL and [place], PharmD, BCPS Clinical Pharmacist 06/27/2022 5:30 PM

## 2022-06-27 NOTE — Progress Notes (Signed)
PROGRESS NOTE Cameron Rice  BTD:[id number] DOB: 1951/01/30 DOA: 07/03/2022 PCP: Flossie Buffy, NP   Brief Narrative/Hospital Course: 71 y.o. with history of hypertension-on Lotrel 10-20, HLD on Lipitor 40,recurrent UTI BPH history of urethral surgery who apparently had umbilical hernia repair on November 2 and since then he has been having abdominal distention and not feeling well, and having nausea vomiting x2 days and abdominal distention and pain  [facility]:CT abdomen pelvis showed significant findings of possible metastatic disease with small bowel obstruction.  Patient has had colonoscopy about a year ago that was normal.  His most recent labs in August of this year where normal but now LFTs are markedly elevated.  Alkaline phosphatase also elevated.  Surgery consulted and admitted.  S/P diagnostic laparoscopy 11/6 Dr. Ninfa Linden and biopsy> found to have peritoneal carcinomatosis CT 11/18-concerning for DVT, PE, placed on heparin drip after discussing with surgery    Subjective: Seen and examined this morning, patient resting, wife is at the bedside Addressee with NG tube overnight needing advancement and repeat chest x-rays. Small bowel movement this morning and passing some flatus. Overnight patient is afebrile BP stable NGT 1350 cc Labs with potassium 3.3 WBC still elevated 25 K  Assessment and Plan: Principal Problem:   SBO (small bowel obstruction) (HCC) Active Problems:   Essential hypertension   Complicated UTI (urinary tract infection)   BPH (benign prostatic hyperplasia)   Pure hypercholesterolemia   LFT elevation  Postop abdominal pain with ascites after umbilical hernia repair with mesh Dr. Rosendo Gros 11/2 Diffuse intra-abdominal carcinomatosis with Adenocarcinoma in cytology,unclear primary source Postop ileus: S/P diagnostic laparoscopy 11/16 Dr. Ninfa Linden and biopsy noted diffuse intra-abdominal carcinomatosis with studding of the small and large bowel  omentum and peritoneum as well as at the level of the diaphragm. Pathology pending, but cytology-showed malignant cells consistent with adenocarcinoma,CEA/CA 19-9 normal.CT chest abdomen pelvis with contrast> did not reveal any primary.  Appreciate surgery input on board awaiting on return of bowel function, ongoing ileus, > ccs planning for TPN, continue gentle IV fluid hydration, continue pain management, NG tube decompression.  Seen by oncology patient and family does not want any treatment here and interested to follow-up at [facility]-surgery planning for referral to [facility] as outpatient to be considered for HIPEC.   CT finding concerning for PE and suspicious for DVT: CT reported as " Pulmonary embolus within the proximal superior lingular artery appears eccentric, suggesting a degree of chronicity.Slightly asymmetrically expanded appearance of the right common iliac vein with central rounded hypoattenuation extending to the level of the infrarenal IVC, which persists on the delayed images, suspicious for deep venous thrombosis" discussed w/ CCS and placed on heparin drip 11/18.  Duplex unremarkable, oncology has seen and discussed advised to continue the same.    Significant leukocytosis: UA with WBC 0-5 nitrate positive. CT abdomen showed right lower chest.Atelectasis in both lower lobes trace right pleural effusion. Suspect in the setting of peritoneal carcinomatosis/adenocarcinoma.  Complete antibiotics x5 days and stop-blood culture from 11/16-NGTD. Recent Labs  Lab 06/23/22 0448 06/24/22 0524 06/25/22 0046 06/26/22 0501 06/27/22 0433  WBC 24.4* 20.7* 19.3* 24.7* 25.4*   Hypokalemia: repleting again w/ iv kcl and ivf.monitor Recent Labs  Lab 06/23/22 0448 06/24/22 0524 06/25/22 0046 06/26/22 0501 06/27/22 0433  K 4.0 3.9 3.6 3.2* 3.3*    Transaminitis with elevated ALP AST ALT,normal TB.monitor Hypoalbuminemia: in the setting of #1:  planning for TPN TGG:YIRSWNI History of  UTI: UA unremarkable HTN: BP  is stable.Continue to hold meds  HLD: po meds hold  DVT prophylaxis: Heparin gtt Code Status:   Code Status: Full Code Family Communication: plan of care discussed with patient/wife  at bedside.  Patient status is: Inpatient because of SBO Level of care: Med-Surg  Dispo: The patient is from: home            Anticipated disposition: tbd  Objective: Vitals last 24 hrs: Vitals:   06/26/22 0605 06/26/22 1351 06/26/22 2050 06/27/22 0525  BP: (!) 129/94 (!) 134/94 (!) 137/92 (!) 137/98  Pulse: 96 96 (!) 101 (!) 101  Resp: '18 18 18 18  '$ Temp: 98.3 F (36.8 C) 98.5 F (36.9 C) 97.7 F (36.5 C) (!) 97.5 F (36.4 C)  TempSrc: Oral Oral Oral Oral  SpO2: 97% 96% 95% 95%  Weight:      Height:       Weight change:   Physical Examination: General exam: Alert awake, weak,older appearing HEENT:Oral mucosa moist, Ear/Nose WNL grossly, dentition normal. Respiratory system: bilaterally clear BS, use of accessory muscle Cardiovascular system: S1 & S2 +, regular rate. Gastrointestinal system: Abdomen soft, mild to moderate distention with diffuse tenderness, surgical site c/d/i,BS hypoactive Nervous System:Alert, awake, moving extremities and grossly nonfocal Extremities: LE ankle edema neg, lower extremities warm Skin: No rashes,no icterus. MSK: Normal muscle bulk,tone, power   Medications reviewed:  Scheduled Meds:  insulin aspart  0-15 Units Subcutaneous Q4H    Continuous Infusions:  dextrose 5 % with KCl 20 mEq / L     dextrose 5 % with KCl 20 mEq / L     heparin 1,550 Units/hr (06/27/22 0645)   methocarbamol (ROBAXIN) IV 500 mg (06/25/22 0526)   piperacillin-tazobactam (ZOSYN)  IV 3.375 g (06/27/22 0314)   potassium chloride 10 mEq (06/27/22 0946)   TPN ADULT (ION)      Diet Order             Diet NPO time specified Except for: Ice Chips, Sips with Meds, Other (See Comments)  Diet effective now                  Intake/Output Summary (Last  24 hours) at 06/27/2022 1013 Last data filed at 06/27/2022 0600 Gross per 24 hour  Intake 1233.58 ml  Output 1700 ml  Net -466.42 ml  Net IO Since Admission: 4,745.63 mL [06/27/22 1013]  Wt Readings from Last 3 Encounters:  06/09/2022 78 kg  04/24/22 80 kg  03/09/22 81.8 kg   Unresulted Labs (From admission, onward)     Start     Ordered   07/03/22 0500  Triglycerides  (TPN Lab Panel)  Every Monday (0500),   R      06/27/22 0919   06/29/22 0500  Comprehensive metabolic panel  (TPN Lab Panel)  Every Mon,Thu (0500),   R      06/27/22 0919   06/29/22 0500  Magnesium  (TPN Lab Panel)  Every Mon,Thu (0500),   R      06/27/22 0919   06/29/22 0500  Phosphorus  (TPN Lab Panel)  Every Mon,Thu (0500),   R      06/27/22 0919   06/28/22 0500  CBC  Daily at 5am,   R      06/27/22 0907   06/28/22 0500  Comprehensive metabolic panel  (TPN Lab Panel)  Tomorrow morning,   R        06/27/22 0919   06/28/22 0500  Magnesium  (TPN Lab Panel)  Tomorrow morning,   R        06/27/22 0919   06/28/22 0500  Phosphorus  (TPN Lab Panel)  Tomorrow morning,   R        06/27/22 0919   06/28/22 0500  Triglycerides  (TPN Lab Panel)  Tomorrow morning,   R        06/27/22 0919   06/27/22 1300  Heparin level (unfractionated)  Once-Timed,   TIMED        06/27/22 0648   06/27/22 0919  Hemoglobin A1c  Once,   R       Comments: To assess prior glycemic control    06/27/22 0918   06/26/22 0500  Heparin level (unfractionated)  Daily at 5am,   R      06/25/22 1230          Data Reviewed: I have personally reviewed following labs and imaging studies CBC: Recent Labs  Lab 06/23/22 0448 06/24/22 0524 06/25/22 0046 06/26/22 0501 06/27/22 0433  WBC 24.4* 20.7* 19.3* 24.7* 25.4*  NEUTROABS 22.2* 18.1* 17.1* 22.0* 22.6*  HGB 12.6* 11.7* 12.3* 12.1* 12.7*  HCT 38.6* 36.8* 37.1* 38.0* 40.1  MCV 92.3 94.1 92.5 94.3 94.8  PLT 483* 468* 507* 558* 081*   Basic Metabolic Panel: Recent Labs  Lab 06/23/22 0448  06/24/22 0524 06/25/22 0046 06/26/22 0501 06/27/22 0433  NA 142 147* 145 147* 145  K 4.0 3.9 3.6 3.2* 3.3*  CL 110 115* 113* 111 107  CO2 '26 28 27 29 29  '$ GLUCOSE 159* 130* 126* 136* 122*  BUN '17 13 15 17 20  '$ CREATININE 0.63 0.77 0.72 0.84 0.85  CALCIUM 8.5* 8.1* 8.3* 8.2* 8.3*  MG  --  2.2  --   --  2.5*   GFR: Estimated Creatinine Clearance: 87.4 mL/min (by C-G formula based on SCr of 0.85 mg/dL). Liver Function Tests: Recent Labs  Lab 06/23/22 0448 06/24/22 0524 06/25/22 0046 06/26/22 0501 06/27/22 0433  AST 57* 71* 139* 41 30  ALT 76* 73* 153* 89* 65*  ALKPHOS 151* 136* 165* 144* 143*  BILITOT 0.8 0.9 1.2 1.3* 1.3*  PROT 6.7 5.9* 6.4* 6.0* 6.6  ALBUMIN 2.3* 2.0* 2.2* 2.1* 2.2*   Recent Results (from the past 240 hour(s))  Culture, blood (Routine X 2) w Reflex to ID Panel     Status: None   Collection Time: 06/28/2022  8:20 AM   Specimen: BLOOD  Result Value Ref Range Status   Specimen Description   Final    BLOOD BLOOD RIGHT HAND Performed at [facility], [place] [address] [place], [place] [address]    Special Requests   Final    BOTTLES DRAWN AEROBIC AND ANAEROBIC Blood Culture results may not be optimal due to an inadequate volume of blood received in culture bottles Performed at [place] [address], [place], [place] [address]    Culture   Final    NO GROWTH 5 DAYS Performed at [facility], [place] [address], [place], [place] [address]    Report Status 06/27/2022 FINAL  Final  Culture, blood (Routine X 2) w Reflex to ID Panel     Status: None   Collection Time: 06/26/2022  8:31 AM   Specimen: BLOOD  Result Value Ref Range Status   Specimen Description   Final    BLOOD BLOOD RIGHT ARM Performed at [place] [address] [place], [place] [address]    Special Requests  Final    BOTTLES DRAWN AEROBIC AND ANAEROBIC Blood Culture results may not be optimal due to an inadequate volume of  blood received in culture bottles Performed at [facility], [place] [address] [place], [place] [address]    Culture   Final    NO GROWTH 5 DAYS Performed at [facility], [place] [address], [place], [place] [address]    Report Status 06/27/2022 FINAL  Final  Surgical pcr screen     Status: None   Collection Time: 06/15/2022 10:52 AM   Specimen: Nasal Mucosa; Nasal Swab  Result Value Ref Range Status   MRSA, PCR NEGATIVE NEGATIVE Final   Staphylococcus aureus NEGATIVE NEGATIVE Final    Comment: (NOTE) The Xpert SA Assay (FDA approved for NASAL specimens in patients 54 years of age and older), is one component of a comprehensive surveillance program. It is not intended to diagnose infection nor to guide or monitor treatment. Performed at [facility], [place] [address], [place], [place] [address]     Antimicrobials: Anti-infectives (From admission, onward)    Start     Dose/Rate Route Frequency Ordered Stop   07/04/2022 0200  piperacillin-tazobactam (ZOSYN) IVPB 3.375 g       See Hyperspace for full Linked Orders Report.   3.375 g 12.5 mL/hr over 240 Minutes Intravenous Every 8 hours 07/01/2022 1849     06/08/2022 1900  piperacillin-tazobactam (ZOSYN) IVPB 3.375 g       See Hyperspace for full Linked Orders Report.   3.375 g 100 mL/hr over 30 Minutes Intravenous  Once 06/23/2022 1849 06/17/2022 1940      Culture/Microbiology    Component Value Date/Time   SDES  07/04/2022 0831    BLOOD BLOOD RIGHT ARM Performed at [facility], [place] [address], [place], [place] [address]    [place]  06/20/2022 0831    BOTTLES DRAWN AEROBIC AND ANAEROBIC Blood Culture results may not be optimal due to an inadequate volume of blood received in culture bottles Performed at [facility], [place] [address] [place], [place] [address]    CULT  06/12/2022 0831    NO GROWTH 5 DAYS Performed at [place] [address], [place], [place] [address]    REPTSTATUS 06/27/2022 FINAL 07/03/2022 0831    Other culture-see note  Radiology Studies: [place] EKG SITE RITE  Result Date: 06/27/2022 If Site Rite image not attached, placement could not be confirmed due to current cardiac rhythm.  DG Abd Portable 1V  Result Date: 06/26/2022 CLINICAL DATA:  Nasogastric tube placement. EXAM: PORTABLE ABDOMEN - 1 VIEW COMPARISON:  Radiograph earlier today. FINDINGS: The enteric tube has been advanced, the tip is now below the diaphragm in the stomach. The side-port is in the distal esophagus. Advancement of at least 6 cm would place the side-port below the diaphragm. Enteric contrast in the colon from prior CT. IMPRESSION: Tip of the enteric tube now below the diaphragm in the stomach, side-port in the distal esophagus. Advancement of at least 6 cm would place the side-port below the diaphragm. Electronically Signed   By: Keith Rake M.D.   On: 06/26/2022 21:13   DG Abd 1 View  Result Date: 06/26/2022 CLINICAL DATA:  Confirm NG tube placement. EXAM: ABDOMEN - 1 VIEW COMPARISON:  06/23/2022. FINDINGS: The bowel gas pattern is normal. Retained contrast is present in the colon. A nasogastric tube terminates in the mid esophagus and should be advanced proximally 18  cm. Mild atelectasis is noted at the lung bases. No radio-opaque calculi or other acute radiographic abnormality are seen. IMPRESSION: Nasogastric tube terminates in the mid esophagus and should be advanced approximately 18 cm. Electronically Signed   By: Brett Fairy M.D.   On: 06/26/2022 20:04   VAS [place] LOWER EXTREMITY VENOUS (DVT)  Result Date: 06/25/2022  Lower Venous DVT Study Patient Name:  [name] L Rice  Date of Exam:   06/25/2022 Medical Rec #: [id number]         Accession #:    [id number] Date of Birth: [birth year]         Patient Gender: M Patient Age:   63 years Exam Location:  [facility] Procedure:      VAS [place] LOWER EXTREMITY VENOUS (DVT) Referring  Phys: [place] --------------------------------------------------------------------------------  Indications: Pain.  Comparison Study: No previous exams Performing Technologist: Jody Hill RVT, RDMS  Examination Guidelines: A complete evaluation includes B-mode imaging, spectral Doppler, color Doppler, and power Doppler as needed of all accessible portions of each vessel. Bilateral testing is considered an integral part of a complete examination. Limited examinations for reoccurring indications may be performed as noted. The reflux portion of the exam is performed with the patient in reverse Trendelenburg.  +---------+---------------+---------+-----------+----------+--------------+ RIGHT    CompressibilityPhasicitySpontaneityPropertiesThrombus Aging +---------+---------------+---------+-----------+----------+--------------+ CFV      Full           Yes      Yes                                 +---------+---------------+---------+-----------+----------+--------------+ SFJ      Full                                                        +---------+---------------+---------+-----------+----------+--------------+ FV Prox  Full           Yes      Yes                                 +---------+---------------+---------+-----------+----------+--------------+ FV Mid   Full           Yes      Yes                                 +---------+---------------+---------+-----------+----------+--------------+ FV DistalFull           Yes      Yes                                 +---------+---------------+---------+-----------+----------+--------------+ PFV      Full                                                        +---------+---------------+---------+-----------+----------+--------------+ POP      Full           Yes      Yes                                 +---------+---------------+---------+-----------+----------+--------------+  PTV      Full                                                         +---------+---------------+---------+-----------+----------+--------------+ PERO     Full                                                        +---------+---------------+---------+-----------+----------+--------------+   +---------+---------------+---------+-----------+----------+--------------+ LEFT     CompressibilityPhasicitySpontaneityPropertiesThrombus Aging +---------+---------------+---------+-----------+----------+--------------+ CFV      Full           Yes      Yes                                 +---------+---------------+---------+-----------+----------+--------------+ SFJ      Full                                                        +---------+---------------+---------+-----------+----------+--------------+ FV Prox  Full           Yes      Yes                                 +---------+---------------+---------+-----------+----------+--------------+ FV Mid   Full           Yes      Yes                                 +---------+---------------+---------+-----------+----------+--------------+ FV DistalFull           Yes      Yes                                 +---------+---------------+---------+-----------+----------+--------------+ PFV      Full                                                        +---------+---------------+---------+-----------+----------+--------------+ POP      Full           Yes      Yes                                 +---------+---------------+---------+-----------+----------+--------------+ PTV      Full                                                        +---------+---------------+---------+-----------+----------+--------------+  PERO     Full                                                        +---------+---------------+---------+-----------+----------+--------------+     Summary: BILATERAL: - No evidence of deep vein thrombosis seen in the lower  extremities, bilaterally. -No evidence of popliteal cyst, bilaterally.   *See table(s) above for measurements and observations. Electronically signed by Deitra Mayo MD on 06/25/2022 at 7:09:32 PM.    Final      LOS: 6 days   Antonieta Pert, MD [facility]  06/27/2022, 10:13 AM

## 2022-06-27 NOTE — Progress Notes (Signed)
ANTICOAGULATION CONSULT NOTE - Follow Up Consult  Pharmacy Consult for Heparin Indication: pulmonary embolus  No Known Allergies  Patient Measurements: Height: '5\' 11"'$  (180.3 cm) Weight: 78 kg (171 lb 15.3 oz) IBW/kg (Calculated) : 75.3 Heparin Dosing Weight: 78 kg  Vital Signs: Temp: 97.5 F (36.4 C) (11/21 0525) Temp Source: Oral (11/21 0525) BP: 137/98 (11/21 0525) Pulse Rate: 101 (11/21 0525)  Labs: Recent Labs    06/25/22 0046 06/25/22 1000 06/26/22 0501 06/27/22 0433  HGB 12.3*  --  12.1* 12.7*  HCT 37.1*  --  38.0* 40.1  PLT 507*  --  558* 648*  HEPARINUNFRC 0.57 0.45 0.33 0.20*  CREATININE 0.72  --  0.84  --      Estimated Creatinine Clearance: 88.4 mL/min (by C-G formula based on SCr of 0.84 mg/dL).  Assessment: Pt is a 7 yoM who underwent diagnostic laparoscopy with biopsy of omental and peritoneal nodules on 11/16 (AET 1541). Pt s/p umbilical hernia repair on 06/08/22.    CT Abdomen (11/18): +PE Venous doppler (11/18): Ordered Pharmacy consulted to dose heparin for VTE treatment.   06/27/2022:  Heparin level 0.2 - subtherapeutic on IV heparin 1350 units/hr.   *Rate was increased to 1400 units/hr yesterday morning but then inadvertently changed back to 1350 units/hr when new bag hung.  Currently infusing at 1350 units/hr since 2337 on 11/20 CBC: Hg 12.7- slightly low/stable, pltc 648-elevated No bleeding noted  Goal of Therapy:  Heparin level 0.3-0.7 units/ml Monitor platelets by anticoagulation protocol: Yes   Plan:  Increase heparin to 1550 units/hr  Recheck heparin level in 6h Daily HL and CBC  Netta Cedars PharmD 06/27/2022,6:33 AM

## 2022-06-27 NOTE — Progress Notes (Signed)
PHARMACY - TOTAL PARENTERAL NUTRITION CONSULT NOTE   Indication: SBO with Prolonged ileus  Patient Measurements: Height: '5\' 11"'$  (180.3 cm) Weight: 78 kg (171 lb 15.3 oz) IBW/kg (Calculated) : 75.3 TPN AdjBW (KG): 78 Body mass index is 23.98 kg/m. Usual Weight:    Assessment:  Hx of hernia surgery on 11/2  Now with SBO -S/P diagnostic laparoscopy 11/6 Dr. Ninfa Linden and biopsy> found to have peritoneal carcinomatosis.   Glucose / Insulin: No h/o DM noted. Electrolytes: K 3.3 low today. Mg 2.5 up. (Previously elevated Na) Renal: Scr <1 Hepatic: Alkphos and ALT elevated, Tbili 1.3 Intake / Output; MIVF:  MIVF: D5W + 84mq K+ at 779mhr Output:  NG 135033m4h, LBM 11/19 GI Imaging: 11/15: CT: Mildly dilated loops of small bowel in the left lower quadrant with scattered air-fluid levels, nonspecific for ileus versus early obstruction 11/17: Xray: ileus vs obstruction 11/18: CT:  peritoneal carcinomatosis,  partial bowel obstruction/ileus  GI Surgeries / Procedures:  hernia surgery on 11/2  diagnostic laparoscopy 11/6   Central access: PICC ordered 11/21 TPN start date: 11/21 PM  Nutritional Goals: Goal TPN rate is   mL/hr (provides   g of protein and   kcals per day)  RD Assessment:    Current Nutrition:  NPO  Plan:  Start TPN at 67m35m at 1800 Electrolytes in TPN: Na 0mEq69m K 50mEq50mCa 5mEq/L14mg 0mEq/L,42md Phos 15mmol/L67m:Ac 1:1 Add standard MVI and trace elements to TPN Initiate Moderate q 4hr SSI and adjust as needed  Reduce MIVF to 35 mL/hr at 1800 Monitor TPN labs on Mon/Thurs and PRN K+ 10meq IV 92mordered by MD   Hamad Whyte S. Sartaj Hoskin,Alford HighlandBCPS Clinical Staff Pharmacist [email or web address] Waleska Buttery,Alford HighlandStHamblen3,9:02 AM

## 2022-06-28 ENCOUNTER — Inpatient Hospital Stay (HOSPITAL_COMMUNITY): Payer: BC Managed Care – PPO

## 2022-06-28 DIAGNOSIS — K56609 Unspecified intestinal obstruction, unspecified as to partial versus complete obstruction: Secondary | ICD-10-CM | POA: Diagnosis not present

## 2022-06-28 DIAGNOSIS — E440 Moderate protein-calorie malnutrition: Secondary | ICD-10-CM | POA: Insufficient documentation

## 2022-06-28 LAB — COMPREHENSIVE METABOLIC PANEL WITH GFR
ALT: 48 U/L — ABNORMAL HIGH (ref 0–44)
AST: 26 U/L (ref 15–41)
Albumin: 2.4 g/dL — ABNORMAL LOW (ref 3.5–5.0)
Alkaline Phosphatase: 141 U/L — ABNORMAL HIGH (ref 38–126)
Anion gap: 8 (ref 5–15)
BUN: 21 mg/dL (ref 8–23)
CO2: 31 mmol/L (ref 22–32)
Calcium: 8.6 mg/dL — ABNORMAL LOW (ref 8.9–10.3)
Chloride: 108 mmol/L (ref 98–111)
Creatinine, Ser: 0.82 mg/dL (ref 0.61–1.24)
GFR, Estimated: >60 mL/min
Glucose, Bld: 156 mg/dL — ABNORMAL HIGH (ref 70–99)
Potassium: 3.7 mmol/L (ref 3.5–5.1)
Sodium: 147 mmol/L — ABNORMAL HIGH (ref 135–145)
Total Bilirubin: 1.2 mg/dL (ref 0.3–1.2)
Total Protein: 6.8 g/dL (ref 6.5–8.1)

## 2022-06-28 LAB — CBC
HCT: 42.4 % (ref 39.0–52.0)
Hemoglobin: 13.2 g/dL (ref 13.0–17.0)
MCH: 29.4 pg (ref 26.0–34.0)
MCHC: 31.1 g/dL (ref 30.0–36.0)
MCV: 94.4 fL (ref 80.0–100.0)
Platelets: 705 10*3/uL — ABNORMAL HIGH (ref 150–400)
RBC: 4.49 MIL/uL (ref 4.22–5.81)
RDW: 13.5 % (ref 11.5–15.5)
WBC: 31.2 10*3/uL — ABNORMAL HIGH (ref 4.0–10.5)
nRBC: 0.4 % — ABNORMAL HIGH (ref 0.0–0.2)

## 2022-06-28 LAB — GLUCOSE, CAPILLARY
Glucose-Capillary: 139 mg/dL — ABNORMAL HIGH (ref 70–99)
Glucose-Capillary: 139 mg/dL — ABNORMAL HIGH (ref 70–99)
Glucose-Capillary: 140 mg/dL — ABNORMAL HIGH (ref 70–99)
Glucose-Capillary: 150 mg/dL — ABNORMAL HIGH (ref 70–99)
Glucose-Capillary: 156 mg/dL — ABNORMAL HIGH (ref 70–99)
Glucose-Capillary: 179 mg/dL — ABNORMAL HIGH (ref 70–99)
Glucose-Capillary: 180 mg/dL — ABNORMAL HIGH (ref 70–99)

## 2022-06-28 LAB — HEPARIN LEVEL (UNFRACTIONATED)
Heparin Unfractionated: 0.42 [IU]/mL (ref 0.30–0.70)
Heparin Unfractionated: 0.79 IU/mL — ABNORMAL HIGH (ref 0.30–0.70)
Heparin Unfractionated: 0.27 IU/mL — ABNORMAL LOW (ref 0.30–0.70)

## 2022-06-28 LAB — TRIGLYCERIDES: Triglycerides: 189 mg/dL — ABNORMAL HIGH (ref <150)

## 2022-06-28 LAB — PHOSPHORUS: Phosphorus: 2.7 mg/dL (ref 2.5–4.6)

## 2022-06-28 LAB — MAGNESIUM: Magnesium: 2.5 mg/dL — ABNORMAL HIGH (ref 1.7–2.4)

## 2022-06-28 MED ORDER — IOHEXOL 300 MG/ML  SOLN
100.0000 mL | Freq: Once | INTRAMUSCULAR | Status: AC | PRN
  Administered 2022-06-28: 100 mL via INTRAVENOUS

## 2022-06-28 MED ORDER — SODIUM CHLORIDE 0.9% FLUSH
10.0000 mL | INTRAVENOUS | Status: DC | PRN
  Administered 2022-06-29 – 2022-07-03 (×3): 10 mL

## 2022-06-28 MED ORDER — TRAVASOL 10 % IV SOLN
INTRAVENOUS | Status: AC
  Filled 2022-06-28: qty 858

## 2022-06-28 MED ORDER — POTASSIUM CL IN DEXTROSE 5% 20 MEQ/L IV SOLN
20.0000 meq | INTRAVENOUS | Status: AC
  Administered 2022-06-28: 20 meq via INTRAVENOUS
  Filled 2022-06-28: qty 1000

## 2022-06-28 MED ORDER — SODIUM CHLORIDE 0.9% FLUSH
10.0000 mL | Freq: Two times a day (BID) | INTRAVENOUS | Status: DC
  Administered 2022-06-28: 20 mL
  Administered 2022-06-28: 15 mL
  Administered 2022-06-29 – 2022-07-05 (×8): 10 mL
  Administered 2022-07-05: 30 mL
  Administered 2022-07-06: 10 mL
  Administered 2022-07-06: 20 mL
  Administered 2022-07-07 – 2022-07-08 (×3): 10 mL
  Administered 2022-07-08 – 2022-07-09 (×2): 30 mL
  Administered 2022-07-09 – 2022-07-12 (×9): 10 mL
  Administered 2022-07-13: 20 mL
  Administered 2022-07-13 – 2022-07-14 (×2): 10 mL

## 2022-06-28 NOTE — Progress Notes (Signed)
PROGRESS NOTE Cameron Rice  STM:[id number] DOB: 09-11-1950 DOA: 07/03/2022 PCP: Flossie Buffy, NP   Brief Narrative/Hospital Course: 71 y.o. with history of hypertension-on Lotrel 10-20, HLD on Lipitor 40,recurrent UTI BPH history of urethral surgery who apparently had umbilical hernia repair on November 2 and since then he has been having abdominal distention and not feeling well, and having nausea vomiting x2 days and abdominal distention and pain  [facility]:CT abdomen pelvis showed significant findings of possible metastatic disease with small bowel obstruction.  Patient has had colonoscopy about a year ago that was normal.  His most recent labs in August of this year where normal but now LFTs are markedly elevated.  Alkaline phosphatase also elevated.  Surgery consulted and admitted.  S/P diagnostic laparoscopy 11/6 Dr. Ninfa Linden and biopsy> found to have peritoneal carcinomatosis CT 11/18-concerning for DVT, PE, placed on heparin drip after discussing with surgery    Subjective: Seen and examined this morning.  Wife at the bedside Overnight afebrile has been tachycardic this morning on ambulation, BP 127/98> saturating well on room air  Labs reviewed-sodium elevated 147 WBC count further worsening 31.2-completed 6 days of zosyn 11/21 Chest x-ray reviewed from this morning low lung volumes, no acute finding PICC line in place NG tube in the stomach NGT  down 500 cc No chest pain or shortness of breath.  Ongoing abdominal distention and discomfort, " rumbling" in his stomach  Assessment and Plan: Principal Problem:   SBO (small bowel obstruction) (HCC) Active Problems:   Essential hypertension   Complicated UTI (urinary tract infection)   BPH (benign prostatic hyperplasia)   Pure hypercholesterolemia   LFT elevation   Malnutrition of moderate degree  Postop abdominal pain with ascites after umbilical hernia repair with mesh Dr. Rosendo Gros 11/2 Diffuse intra-abdominal  carcinomatosis with Adenocarcinoma in cytology,unclear primary source Postop ileus: S/P diagnostic laparoscopy 11/16 Dr. Ninfa Linden and biopsy noted diffuse intra-abdominal carcinomatosis with studding of the small and large bowel omentum and peritoneum as well as at the level of the diaphragm. Pathology pending, but cytology-showed malignant cells consistent with adenocarcinoma,CEA/CA 19-9 normal.CT chest abdomen pelvis with contrast> did not reveal any primary.  Awaiting on return of bowel function, patient with worsening WBC count.  Chest x-ray without pneumonia or effusion, Got CT abdomen today> result came back and reviewed showed a stable diffuse peritoneal carcinomatosis with extensive omental and peritoneal disease, progressive abdominal/pelvic ascites, no obstructive finding, progressive body wall edema. Started on TPN.  Continue pain control supportive care NGT decompression as per surgery.  Seen by oncology patient and family does not want any treatment here> wife informed they got a call from [facility] for follow-up as OP  CT finding concerning for PE and suspicious for DVT: CT reported as " Pulmonary embolus within the proximal superior lingular artery appears eccentric, suggesting a degree of chronicity.Slightly asymmetrically expanded appearance of the right common iliac vein with central rounded hypoattenuation extending to the level of the infrarenal IVC, which persists on the delayed images, suspicious for deep venous thrombosis" discussed w/ [facility] and placed on heparin drip 11/18, Duplex unremarkabl. Discussed w/ Dr Lindi Adie and agrees with above  Significant leukocytosis: UA with WBC 0-5 nitrate positive.  With slight bump overnight no fever.  Getting CT abdomen pelvis, chest x-ray unremarkable.  Patient received 6 days of Zosyn, discontinued 11/21, Bllood culture from 11/16-NGTD.  Low threshold for antibiotics for fever Recent Labs  Lab 06/24/22 0524 06/25/22 0046 06/26/22 0501  06/27/22 0433 06/28/22 0043  WBC 20.7* 19.3* 24.7* 25.4* 31.2*   Hypokalemia: resolved Recent Labs  Lab 06/24/22 0524 06/25/22 0046 06/26/22 0501 06/27/22 0433 06/28/22 0043  K 3.9 3.6 3.2* 3.3* 3.7    Transaminitis with elevated ALP AST ALT,normal TB.monitor Hypoalbuminemia: in the setting of #1:  planning for TPN YSA:[id number] History of UTI: UA unremarkable HTN: BP is stable.Continue to hold meds  HLD: po meds hold Moderate malnutrition patient is started on TPN now.  Dietitian following. Nutrition Status: Nutrition Problem: Moderate Malnutrition Etiology: acute illness (SBO following urethral surgery) Signs/Symptoms: energy intake < 75% for > 7 days, mild fat depletion, mild muscle depletion Interventions: TPN   DVT prophylaxis: Heparin gtt Code Status:   Code Status: Full Code Family Communication: plan of care discussed with patient/wife  at bedside.  Patient status is: Inpatient because of SBO Level of care: Med-Surg  Dispo: The patient is from: home            Anticipated disposition: tbd  Objective: Vitals last 24 hrs: Vitals:   06/28/22 0615 06/28/22 0710 06/28/22 0905 06/28/22 1130  BP: (!) 135/98 (!) 124/98 (!) 132/100 (!) 130/94  Pulse: (!) 129 (!) 127 (!) 125 99  Resp: '18 18 18 18  '$ Temp: 98 F (36.7 C) 98.5 F (36.9 C) 98.4 F (36.9 C) 98.6 F (37 C)  TempSrc: Oral  Oral Oral  SpO2: 97% 97% 96% 99%  Weight:      Height:       Weight change:   Physical Examination: General exam: AAOX3, pleasant, NAD.  HEENT:Oral mucosa moist, Ear/Nose WNL grossly, dentition normal. Respiratory system: bilaterally clear BS, no use of accessory muscle Cardiovascular system: S1 & S2 +, regular rate, JVD neg. Gastrointestinal system: Abdomen soft, minimal diffuse tenderness present incision site clean/dry/intact NG tube in place, distention of abdomen noted, hypoactive bowel sounds  Nervous System:Alert, awake, moving extremities and grossly nonfocal Extremities:  LE ankle edema neg, lower extremities warm Skin: No rashes,no icterus. MSK: Normal muscle bulk,tone, power   Medications reviewed:  Scheduled Meds:  Chlorhexidine Gluconate Cloth  6 each Topical Daily   insulin aspart  0-15 Units Subcutaneous Q4H   sodium chloride flush  10-40 mL Intracatheter Q12H    Continuous Infusions:  dextrose 5 % with KCl 20 mEq / L 35 mL/hr at 06/28/22 0247   dextrose 5 % with KCl 20 mEq / L     heparin 1,600 Units/hr (06/28/22 0948)   methocarbamol (ROBAXIN) IV 500 mg (06/25/22 0526)   TPN ADULT (ION) 40 mL/hr at 06/27/22 1802   TPN ADULT (ION)      Diet Order             Diet NPO time specified Except for: Ice Chips, Sips with Meds, Other (See Comments)  Diet effective now                  Intake/Output Summary (Last 24 hours) at 06/28/2022 1139 Last data filed at 06/28/2022 1000 Gross per 24 hour  Intake 2444.07 ml  Output 750 ml  Net 1694.07 ml  Net IO Since Admission: 6,409.7 mL [06/28/22 1139]  Wt Readings from Last 3 Encounters:  06/30/2022 78 kg  04/24/22 80 kg  03/09/22 81.8 kg   Unresulted Labs (From admission, onward)     Start     Ordered   07/03/22 0500  Triglycerides  (TPN Lab Panel)  Every Monday (0500),   R      06/27/22 0919   06/29/22 0500  Comprehensive metabolic panel  (TPN Lab Panel)  Every Mon,Thu (0500),   R      06/27/22 0919   06/29/22 0500  Magnesium  (TPN Lab Panel)  Every Mon,Thu (0500),   R      06/27/22 0919   06/29/22 0500  Phosphorus  (TPN Lab Panel)  Every Mon,Thu (0500),   R      06/27/22 0919   06/29/22 0500  Heparin level (unfractionated)  Daily at 5am,   R     Question:  Specimen collection method  Answer:  IV Team=IV Team collect   06/28/22 0213   06/29/22 6568  Basic metabolic panel  Tomorrow morning,   R       Question:  Specimen collection method  Answer:  IV Team=IV Team collect   06/28/22 0815   06/29/22 0500  Magnesium  Tomorrow morning,   R       Question:  Specimen collection method   Answer:  IV Team=IV Team collect   06/28/22 0815   06/28/22 1800  Heparin level (unfractionated)  Once-Timed,   TIMED       Question:  Specimen collection method  Answer:  IV Team=IV Team collect   06/28/22 0917   06/28/22 0500  CBC  Daily at 5am,   R      06/27/22 1275          Data Reviewed: I have personally reviewed following labs and imaging studies CBC: Recent Labs  Lab 06/23/22 0448 06/24/22 0524 06/25/22 0046 06/26/22 0501 06/27/22 0433 06/28/22 0043  WBC 24.4* 20.7* 19.3* 24.7* 25.4* 31.2*  NEUTROABS 22.2* 18.1* 17.1* 22.0* 22.6*  --   HGB 12.6* 11.7* 12.3* 12.1* 12.7* 13.2  HCT 38.6* 36.8* 37.1* 38.0* 40.1 42.4  MCV 92.3 94.1 92.5 94.3 94.8 94.4  PLT 483* 468* 507* 558* 648* 170*   Basic Metabolic Panel: Recent Labs  Lab 06/24/22 0524 06/25/22 0046 06/26/22 0501 06/27/22 0433 06/28/22 0043  NA 147* 145 147* 145 147*  K 3.9 3.6 3.2* 3.3* 3.7  CL 115* 113* 111 107 108  CO2 '28 27 29 29 31  '$ GLUCOSE 130* 126* 136* 122* 156*  BUN '13 15 17 20 21  '$ CREATININE 0.77 0.72 0.84 0.85 0.82  CALCIUM 8.1* 8.3* 8.2* 8.3* 8.6*  MG 2.2  --   --  2.5* 2.5*  PHOS  --   --   --   --  2.7  GFR: Estimated Creatinine Clearance: 90.6 mL/min (by C-G formula based on SCr of 0.82 mg/dL). Liver Function Tests: Recent Labs  Lab 06/24/22 0524 06/25/22 0046 06/26/22 0501 06/27/22 0433 06/28/22 0043  AST 71* 139* 41 30 26  ALT 73* 153* 89* 65* 48*  ALKPHOS 136* 165* 144* 143* 141*  BILITOT 0.9 1.2 1.3* 1.3* 1.2  PROT 5.9* 6.4* 6.0* 6.6 6.8  ALBUMIN 2.0* 2.2* 2.1* 2.2* 2.4*   Recent Results (from the past 240 hour(s))  Culture, blood (Routine X 2) w Reflex to ID Panel     Status: None   Collection Time: 06/11/2022  8:20 AM   Specimen: BLOOD  Result Value Ref Range Status   Specimen Description   Final    BLOOD BLOOD RIGHT HAND Performed at [facility], [place] [address], [place], [place] [address]    Special Requests   Final    BOTTLES DRAWN AEROBIC AND  ANAEROBIC Blood Culture results may not be optimal due to an inadequate volume of blood received in culture bottles Performed  at [facility], [place] [address], [place], [place] [address]    Culture   Final    NO GROWTH 5 DAYS Performed at [facility], [place] [address], [place], [place] [address]    Report Status 06/27/2022 FINAL  Final  Culture, blood (Routine X 2) w Reflex to ID Panel     Status: None   Collection Time: 06/14/2022  8:31 AM   Specimen: BLOOD  Result Value Ref Range Status   Specimen Description   Final    BLOOD BLOOD RIGHT ARM Performed at [place] [address] [place], [place] [address]    Special Requests   Final    BOTTLES DRAWN AEROBIC AND ANAEROBIC Blood Culture results may not be optimal due to an inadequate volume of blood received in culture bottles Performed at [place] [address], [place], [place] [address]    Culture   Final    NO GROWTH 5 DAYS Performed at [facility], [place] [address] [place], [place] [address]    Report Status 06/27/2022 FINAL  Final  Surgical pcr screen     Status: None   Collection Time: 06/18/2022 10:52 AM   Specimen: Nasal Mucosa; Nasal Swab  Result Value Ref Range Status   MRSA, PCR NEGATIVE NEGATIVE Final   Staphylococcus aureus NEGATIVE NEGATIVE Final    Comment: (NOTE) The Xpert SA Assay (FDA approved for NASAL specimens in patients 78 years of age and older), is one component of a comprehensive surveillance program. It is not intended to diagnose infection nor to guide or monitor treatment. Performed at [facility], [place] [address], [place], [place] [address]     Antimicrobials: Anti-infectives (From admission, onward)    Start     Dose/Rate Route Frequency Ordered Stop   06/09/2022 0200  piperacillin-tazobactam (ZOSYN) IVPB 3.375 g  Status:  Discontinued       See Hyperspace for full Linked Orders Report.   3.375  g 12.5 mL/hr over 240 Minutes Intravenous Every 8 hours 06/13/2022 1849 06/27/22 1232   06/23/2022 1900  piperacillin-tazobactam (ZOSYN) IVPB 3.375 g       See Hyperspace for full Linked Orders Report.   3.375 g 100 mL/hr over 30 Minutes Intravenous  Once 06/15/2022 1849 06/10/2022 1940      Culture/Microbiology    Component Value Date/Time   SDES  06/11/2022 0831    BLOOD BLOOD RIGHT ARM Performed at [facility], [place] [address] [place], [place] [address]    [place]  06/19/2022 0831    BOTTLES DRAWN AEROBIC AND ANAEROBIC Blood Culture results may not be optimal due to an inadequate volume of blood received in culture bottles Performed at [facility], [place] [address], [place], [place] [address]    CULT  07/06/2022 0831    NO GROWTH 5 DAYS Performed at [place] [address] [place], [place] [address]    REPTSTATUS 06/27/2022 FINAL 07/06/2022 0831    Other culture-see note  Radiology Studies: CT ABDOMEN PELVIS W CONTRAST  Result Date: 06/28/2022 CLINICAL DATA:  Peritoneal carcinomatosis with abdominal pain. * Tracking Code: BO * EXAM: CT ABDOMEN AND PELVIS WITH CONTRAST TECHNIQUE: Multidetector CT imaging of the abdomen and pelvis was performed using the standard protocol following bolus administration of intravenous contrast. RADIATION DOSE REDUCTION: This exam was performed according to the departmental dose-optimization program which includes automated exposure control, adjustment of the mA and/or  kV according to patient size and/or use of iterative reconstruction technique. CONTRAST:  148m OMNIPAQUE IOHEXOL 300 MG/ML  SOLN COMPARISON:  CT scan 06/24/2022 FINDINGS: Lower chest: Streaky bibasilar atelectasis but no infiltrates or effusion. No pulmonary lesions. Hepatobiliary: No a P[place]ock lesions or intrahepatic biliary dilatation. No obvious peritoneal surface lesions. The gallbladder is unremarkable. No common bile duct dilatation.  Pancreas: No mass, inflammation or ductal dilatation. Spleen: Possible peripheral peritoneal surface lesions. Adrenals/Urinary Tract: Adrenal glands and kidneys are unremarkable. No worrisome renal lesions or hydronephrosis. Stable simple 15 mm left renal cyst not requiring further evaluation or follow-up. Stomach/Bowel: The stomach contains an NG tube. The duodenum, and small bowel are unremarkable. No obstructive findings. Mild bowel wall thickening likely due to the surrounding ascitic fluid. Several areas of peritoneal carcinomatosis involving the colonic wall notably the anti mesenteric border of the hepatic flexure and splenic flexure. No obstructive findings. Vascular/Lymphatic: Stable age advanced atherosclerotic calcifications involving the aorta and branch vessels. The major venous structures are patent. Reproductive: The prostate gland and seminal vesicles are unremarkable. Other: Stable diffuse peritoneal carcinomatosis with extensive omental and peritoneal disease. Interval progressive abdominal/pelvic ascites considered large volume now. Stable left inguinal hernia containing fat. Progressive body wall edema. Musculoskeletal: No significant bony findings. IMPRESSION: 1. Stable diffuse peritoneal carcinomatosis with extensive omental and peritoneal disease. 2. Interval progressive abdominal/pelvic ascites. 3. Several areas of peritoneal carcinomatosis involving the colonic wall but no obstructive findings. 4. Stable age advanced atherosclerotic calcifications involving the aorta and branch vessels. 5. Progressive body wall edema. 6. Aortic atherosclerosis. Aortic Atherosclerosis (ICD10-I70.0). Electronically Signed   By: PMarijo SanesM.D.   On: 06/28/2022 11:20   DG CHEST PORT 1 VIEW  Result Date: 06/28/2022 CLINICAL DATA:  Tachycardia, leukocytosis EXAM: PORTABLE CHEST 1 VIEW COMPARISON:  Radiograph 06/23/2022 FINDINGS: New RIGHT PICC line with tip in the cavoatrial junction. NG tube in stomach.  Normal cardiac silhouette. Low lung volumes. No effusion, infiltrate pneumothorax. IMPRESSION: Low lung volumes.  No acute cardiopulmonary process. Electronically Signed   By: SSuzy BouchardM.D.   On: 06/28/2022 08:10   [place]KG SITE RITE  Result Date: 06/27/2022 If Site Rite image not attached, placement could not be confirmed due to current cardiac rhythm.  DG Abd Portable 1V  Result Date: 06/26/2022 CLINICAL DATA:  Nasogastric tube placement. EXAM: PORTABLE ABDOMEN - 1 VIEW COMPARISON:  Radiograph earlier today. FINDINGS: The enteric tube has been advanced, the tip is now below the diaphragm in the stomach. The side-port is in the distal esophagus. Advancement of at least 6 cm would place the side-port below the diaphragm. Enteric contrast in the colon from prior CT. IMPRESSION: Tip of the enteric tube now below the diaphragm in the stomach, side-port in the distal esophagus. Advancement of at least 6 cm would place the side-port below the diaphragm. Electronically Signed   By: MKeith RakeM.D.   On: 06/26/2022 21:13   DG Abd 1 View  Result Date: 06/26/2022 CLINICAL DATA:  Confirm NG tube placement. EXAM: ABDOMEN - 1 VIEW COMPARISON:  06/23/2022. FINDINGS: The bowel gas pattern is normal. Retained contrast is present in the colon. A nasogastric tube terminates in the mid esophagus and should be advanced proximally 18 cm. Mild atelectasis is noted at the lung bases. No radio-opaque calculi or other acute radiographic abnormality are seen. IMPRESSION: Nasogastric tube terminates in the mid esophagus and should be advanced approximately 18 cm. Electronically Signed   By: LBrett FairyM.D.   On: 06/26/2022  20:04     LOS: 7 days   Antonieta Pert, MD [facility]  06/28/2022, 11:39 AM

## 2022-06-28 NOTE — Progress Notes (Signed)
Initial Nutrition Assessment  DOCUMENTATION CODES:   Non-severe (moderate) malnutrition in context of acute illness/injury  INTERVENTION:   Monitor magnesium, potassium, and phosphorus for at least 3 days, MD to replete as needed, as pt is at risk for refeeding syndrome. NPO x 7 days, poor PO before that.  -TPN management per Pharmacy -Recommend 100 mg Thiamine x 5 days given refeeding risk.  -Recommend daily weights while on TPN  -Will monitor for diet advancement  NUTRITION DIAGNOSIS:   Moderate Malnutrition related to acute illness (SBO following urethral surgery) as evidenced by energy intake < 75% for > 7 days, mild fat depletion, mild muscle depletion.  GOAL:   Patient will meet greater than or equal to 90% of their needs  MONITOR:   Diet advancement, Labs, Weight trends, I & O's (TPN)  REASON FOR ASSESSMENT:   Consult New TPN/TNA  ASSESSMENT:   71 y.o. with history of hypertension-on Lotrel 10-20, HLD on Lipitor 40,recurrent UTI BPH history of urethral surgery who apparently had umbilical hernia repair on November 2 and since then he has been having abdominal distention and not feeling well, and having nausea vomiting x2 days and abdominal distention and pain  11/15 admitted, NPO 11/16: s/p dx lap  Patient in room, family at bedside. Pt lying in bed, did not interact much, resting. NGT in place, set to suction: 400 ml in cannister at time of visit. Per family member, PTA pt was eating low quality diet. For breakfast, would eat a bowl of oatmeal. Wouldn't eat lunch but snack on various items like cookies, fruit, chips, or cakes. Dinner would typically be something like fried chicken with greens and coleslaw.   Per weight records, pt has lost 10 lbs since 2/2 (5% wt loss x 9.5 months, insignificant for time frame).   Medications: D5 infusion, Zofran  Labs reviewed: CBGs: 150-180 Elevated Na Elevated Mg (2.5)  TG: 189  NUTRITION - FOCUSED PHYSICAL  EXAM:  Flowsheet Row Most Recent Value  Orbital Region Mild depletion  Upper Arm Region Mild depletion  Thoracic and Lumbar Region Mild depletion  Buccal Region Moderate depletion  Temple Region Mild depletion  Clavicle Bone Region Mild depletion  Clavicle and Acromion Bone Region Mild depletion  Scapular Bone Region Mild depletion  Dorsal Hand Moderate depletion  Patellar Region No depletion  Anterior Thigh Region No depletion  Posterior Calf Region No depletion  Edema (RD Assessment) None  Hair Reviewed  Eyes Unable to assess  [kept his eyes closed]  Mouth Unable to assess  Skin Reviewed  Nails Reviewed       Diet Order:   Diet Order             Diet NPO time specified Except for: Ice Chips, Sips with Meds, Other (See Comments)  Diet effective now                   EDUCATION NEEDS:   Education needs have been addressed  Skin:  Skin Assessment: Reviewed RN Assessment  Last BM:  11/19  Height:   Ht Readings from Last 1 Encounters:  07/03/2022 '5\' 11"'$  (1.803 m)    Weight:   Wt Readings from Last 1 Encounters:  07/04/2022 78 kg    BMI:  Body mass index is 23.98 kg/m.  Estimated Nutritional Needs:   Kcal:  2100-2300  Protein:  95-110g  Fluid:  2.1L/day  Clayton Bibles, MS, RD, LDN Inpatient Clinical Dietitian Contact information available via Amion

## 2022-06-28 NOTE — Progress Notes (Addendum)
6 Days Post-Op   Subjective/Chief Complaint:  Reports he was able to get some rest last night, states he feels ok, maybe slightly better since TPN started. Denies significant flatus or BM. He walked in the hall this AM and took a bath. He reports ongoing abdominal distention and discomfort. Wife at bedside tells me that she spoke to one of the oncology scheduling RN's at [facility] and got the patient in at the GI oncology clinic at [facility].   Pt denies chest pain or SOB   Objective: Vital signs in last 24 hours: Temp:  [97.8 F (36.6 C)-98.6 F (37 C)] 98.4 F (36.9 C) (11/22 0905) Pulse Rate:  [100-129] 125 (11/22 0905) Resp:  [18] 18 (11/22 0905) BP: (116-135)/(94-100) 132/100 (11/22 0905) SpO2:  [96 %-97 %] 96 % (11/22 0905) Last BM Date : 06/25/22  Intake/Output from previous day: 11/21 0701 - 11/22 0700 In: 2504.1 [P.O.:240; I.V.:1964.1; IV Piggyback:300] Out: 500 [Emesis/NG output:500] Intake/Output this shift: Total I/O In: 60 [P.O.:60] Out: -   Gen:  Alert, NAD Pulm: normal effort on room air Abd: significant distention, minimal diffuse tenderness, incisions cdi, NG in place with brown effluent Ext: no upper or lower extremity edema, no extremity pain   Lab Results:  Recent Labs    06/27/22 0433 06/28/22 0043  WBC 25.4* 31.2*  HGB 12.7* 13.2  HCT 40.1 42.4  PLT 648* 705*   BMET Recent Labs    06/27/22 0433 06/28/22 0043  NA 145 147*  K 3.3* 3.7  CL 107 108  CO2 29 31  GLUCOSE 122* 156*  BUN 20 21  CREATININE 0.85 0.82  CALCIUM 8.3* 8.6*   PT/INR No results for input(s): "LABPROT", "INR" in the last 72 hours. ABG No results for input(s): "PHART", "HCO3" in the last 72 hours.  Invalid input(s): "PCO2", "PO2"  Studies/Results: DG CHEST PORT 1 VIEW  Result Date: 06/28/2022 CLINICAL DATA:  Tachycardia, leukocytosis EXAM: PORTABLE CHEST 1 VIEW COMPARISON:  Radiograph 06/23/2022 FINDINGS: New RIGHT PICC line with tip in the cavoatrial junction. NG  tube in stomach. Normal cardiac silhouette. Low lung volumes. No effusion, infiltrate pneumothorax. IMPRESSION: Low lung volumes.  No acute cardiopulmonary process. Electronically Signed   By: Suzy Bouchard M.D.   On: 06/28/2022 08:10   [place] EKG SITE RITE  Result Date: 06/27/2022 If Site Rite image not attached, placement could not be confirmed due to current cardiac rhythm.  DG Abd Portable 1V  Result Date: 06/26/2022 CLINICAL DATA:  Nasogastric tube placement. EXAM: PORTABLE ABDOMEN - 1 VIEW COMPARISON:  Radiograph earlier today. FINDINGS: The enteric tube has been advanced, the tip is now below the diaphragm in the stomach. The side-port is in the distal esophagus. Advancement of at least 6 cm would place the side-port below the diaphragm. Enteric contrast in the colon from prior CT. IMPRESSION: Tip of the enteric tube now below the diaphragm in the stomach, side-port in the distal esophagus. Advancement of at least 6 cm would place the side-port below the diaphragm. Electronically Signed   By: Keith Rake M.D.   On: 06/26/2022 21:13   DG Abd 1 View  Result Date: 06/26/2022 CLINICAL DATA:  Confirm NG tube placement. EXAM: ABDOMEN - 1 VIEW COMPARISON:  06/23/2022. FINDINGS: The bowel gas pattern is normal. Retained contrast is present in the colon. A nasogastric tube terminates in the mid esophagus and should be advanced proximally 18 cm. Mild atelectasis is noted at the lung bases. No radio-opaque calculi or other acute radiographic  abnormality are seen. IMPRESSION: Nasogastric tube terminates in the mid esophagus and should be advanced approximately 18 cm. Electronically Signed   By: Brett Fairy M.D.   On: 06/26/2022 20:04    Anti-infectives: Anti-infectives (From admission, onward)    Start     Dose/Rate Route Frequency Ordered Stop   06/29/2022 0200  piperacillin-tazobactam (ZOSYN) IVPB 3.375 g  Status:  Discontinued       See Hyperspace for full Linked Orders Report.   3.375  g 12.5 mL/hr over 240 Minutes Intravenous Every 8 hours 07/04/2022 1849 06/27/22 1232   06/14/2022 1900  piperacillin-tazobactam (ZOSYN) IVPB 3.375 g       See Hyperspace for full Linked Orders Report.   3.375 g 100 mL/hr over 30 Minutes Intravenous  Once 06/28/2022 1849 06/26/2022 1940       Assessment/Plan: Post operative abdominal pain with ascites after umbilical hernia repair with mesh by Dr. Rosendo Gros 06/08/22  -POD#6 s/p LAPAROSCOPY DIAGNOSTIC, BIOPSY OF OMENTAL AND PERITONEAL NODULES, ASPIRATION OF ASCITES FOR CYTOLOGY 11/16 Dr. Ninfa Linden - intra-op findings: The patient was found to have what appeared to be diffuse intra-abdominal carcinomatosis with studding of the small and large bowel, omentum, and peritoneum as well as at the level of the diaphragm. We obtained biopsies of the omentum and peritoneal nodules as well as aspirated fluid for cytology.  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 The source of malignancy cannot be easily identified at the time of surgery - pathology and cytology resulted - moderately differentiated adenocarcinoma, oncology consulted 11/20 and will see pt again now that path is back. - CT C/A/P 11/18 showed chronic PE and right common iliac DVT - now on heparin gtt with no sign of bleeding CT showed evidence of peritoneal carcinomatosis but no obvious sign of primary tumor.  No pulmonary tumor or mets noted.  - he has an ileus, continue NG to LIWS.  - Mobilize   ID - zosyn 11/15-11/21; WBC 31 today (from 25, steadily rising), HR up this morning but patient was up walking/bathing. CXR without PNA or effusion, recommend CT of abdomen today, i'd like PO contrast but I do not think he will tolerate this FEN -  NG LIWS, TPN started 11/21  VTE - hep gtt Foley - d/c 11/17, voiding    Transaminitis - trending down HTN HLD BPH Hx UTI      LOS: 7 days    Jill Alexanders 06/28/2022

## 2022-06-28 NOTE — Progress Notes (Addendum)
PHARMACY - TOTAL PARENTERAL NUTRITION CONSULT NOTE   Indication: SBO with Prolonged ileus  Patient Measurements: Height: '5\' 11"'$  (180.3 cm) Weight: 78 kg (171 lb 15.3 oz) IBW/kg (Calculated) : 75.3 TPN AdjBW (KG): 78 Body mass index is 23.98 kg/m. Usual Weight:    Assessment:  Hx of hernia surgery on 11/2  Now with SBO -S/P diagnostic laparoscopy 11/6 Dr. Ninfa Linden and biopsy> found to have peritoneal carcinomatosis.   Glucose / Insulin: No h/o DM noted.A1C 4.8. CBGs up to 180. Can decrease dextrose intake in TPN and IVF.  Electrolytes: Na elevated (none in TPN). K 3.7 today. Mg 2.5 remains up (none in TPN). Goal K>=4 and Mg>=2 with ileus. CoCa 9.88 ok. Renal: Scr <1 Hepatic: Alkphos and ALT elevated but decreased, Tbili 1.2, Triglycerides 189 Intake / Output; MIVF:  Po: 223m/24h (chips/sips), UOP: not recorded MIVF: D5W + 251m K+ at 3560mr Output:  NG 1350>>down to 500m7mh, LBM noted 11/19  GI Imaging: 11/15: CT: Mildly dilated loops of small bowel in the left lower quadrant with scattered air-fluid levels, nonspecific for ileus versus early obstruction 11/17: Xray: ileus vs obstruction 11/18: CT:  peritoneal carcinomatosis,  partial bowel obstruction/ileus  GI Surgeries / Procedures:  hernia surgery on 11/2  diagnostic laparoscopy 11/6   Central access: PICC ordered 11/21 TPN start date: 11/21 PM  Nutritional Goals: Goal TPN rate is   mL/hr (provides   g of protein and   kcals per day)  RD Assessment:    Current Nutrition:  NPO, sips,chips  Plan:  Awaiting RD consult Increase TPN to 65mL40mat 1800 Electrolytes in TPN: Na 0mEq/75mK 50mEq/33ma 5mEq/L,42m 0mEq/L, 42m Phos 15mmol/L.62mAc 1:1 Add standard MVI and trace elements to TPN Initiate Moderate q 4hr SSI and adjust as needed  Reduce D5W + 20meqK+ to108mmL/hr at 1800 Monitor TPN labs on Mon/Thurs and PRN   Kypton Eltringham S. Danira Nylander, Alford HighlandCPS Clinical Staff Pharmacist [email or web address] Soledad Budreau, Alford HighlandStFort[employer],7:51 AM

## 2022-06-28 NOTE — Progress Notes (Signed)
ANTICOAGULATION CONSULT NOTE  Pharmacy Consult for Heparin Indication: pulmonary embolus  No Known Allergies  Patient Measurements: Height: '5\' 11"'$  (180.3 cm) Weight: 78 kg (171 lb 15.3 oz) IBW/kg (Calculated) : 75.3 Heparin Dosing Weight: 78 kg  Vital Signs: Temp: 97.8 F (36.6 C) (11/21 2053) Temp Source: Oral (11/21 2053) BP: 116/94 (11/21 2053) Pulse Rate: 101 (11/21 2053)  Labs: Recent Labs    06/26/22 0501 06/27/22 0433 06/27/22 1547 06/28/22 0043  HGB 12.1* 12.7*  --  13.2  HCT 38.0* 40.1  --  42.4  PLT 558* 648*  --  705*  HEPARINUNFRC 0.33 0.20* 0.27* 0.42  CREATININE 0.84 0.85  --   --      Estimated Creatinine Clearance: 87.4 mL/min (by C-G formula based on SCr of 0.85 mg/dL).  Assessment: Pt is a 43 yoM who underwent diagnostic laparoscopy with biopsy of omental and peritoneal nodules on 11/16 (AET 1541). Pt s/p umbilical hernia repair on 06/08/22.    CT Abdomen (11/18): +PE Venous doppler (11/18): negative bilaterally for DVT  Pharmacy consulted to dose heparin for VTE treatment.   06/28/2022:  Heparin level 0.42 - now therapeutic on increased IV heparin rate of 1700 units/hr   Lab drawn from PICC line; Heparin running in peripheral IV CBC: Hg WNL, Pltc 705- elevated & trending up No bleeding or infusion issues noted per RN  Goal of Therapy:  Heparin level 0.3-0.7 units/ml Monitor platelets by anticoagulation protocol: Yes   Plan:  -Continue IV heparin at 1700 units/hr  -Check confirmatory heparin level in ~ 6 hr -Daily HL and CBC  Netta Cedars, PharmD, BCPS Clinical Pharmacist 06/28/2022 1:50 AM

## 2022-06-28 NOTE — Progress Notes (Signed)
Oncology I discussed with the patient and his family the results of the pathology and explained to them that adenocarcinoma could originate from any number of organs and our pathology was not able to finalize the source of the peritoneal carcinomatosis. They will call [facility] and arrange for consultation with the GI oncology group. We will sign off for now please call us if necessary

## 2022-06-28 NOTE — Progress Notes (Signed)
ANTICOAGULATION CONSULT NOTE - Follow Up Consult  Pharmacy Consult for Heparin Indication: pulmonary embolus  No Known Allergies  Patient Measurements: Height: '5\' 11"'$  (180.3 cm) Weight: 78 kg (171 lb 15.3 oz) IBW/kg (Calculated) : 75.3 Heparin Dosing Weight: 78kg  Vital Signs: Temp: 98.4 F (36.9 C) (11/22 0905) Temp Source: Oral (11/22 0905) BP: 132/100 (11/22 0905) Pulse Rate: 125 (11/22 0905)  Labs: Recent Labs    06/26/22 0501 06/27/22 0433 06/27/22 1547 06/28/22 0043 06/28/22 0843  HGB 12.1* 12.7*  --  13.2  --   HCT 38.0* 40.1  --  42.4  --   PLT 558* 648*  --  705*  --   HEPARINUNFRC 0.33 0.20* 0.27* 0.42 0.79*  CREATININE 0.84 0.85  --  0.82  --     Estimated Creatinine Clearance: 90.6 mL/min (by C-G formula based on SCr of 0.82 mg/dL).   Assessment: AC/Heme: heparin drip for new PE on CT (and suspicious for DVT). But Dopplers negative for DVT. - Hep level 0.42>0.79 now elevated. Hgb WNL, Plts 705 elevated  Goal of Therapy:  Heparin level 0.3-0.7 units/ml Monitor platelets by anticoagulation protocol: Yes   Plan:  Decrease IV heparin to 1600 units/hr Recheck levels in 8 hrs Daily HL and CBC   Andraya Frigon S. Alford Highland, PharmD, BCPS Clinical Staff Pharmacist [email or web address] Alford Highland, [employer] 06/28/2022,9:17 AM

## 2022-06-28 NOTE — Progress Notes (Signed)
ANTICOAGULATION CONSULT NOTE - Follow Up Consult  Pharmacy Consult for Heparin Indication: pulmonary embolus  No Known Allergies  Patient Measurements: Height: '5\' 11"'$  (180.3 cm) Weight: 78 kg (171 lb 15.3 oz) IBW/kg (Calculated) : 75.3 Heparin Dosing Weight: 78kg  Vital Signs: Temp: 98.4 F (36.9 C) (11/22 1914) Temp Source: Oral (11/22 1914) BP: 135/95 (11/22 1914) Pulse Rate: 128 (11/22 1914)  Labs: Recent Labs    06/26/22 0501 06/27/22 0433 06/27/22 1547 06/28/22 0043 06/28/22 0843 06/28/22 1800  HGB 12.1* 12.7*  --  13.2  --   --   HCT 38.0* 40.1  --  42.4  --   --   PLT 558* 648*  --  705*  --   --   HEPARINUNFRC 0.33 0.20*   < > 0.42 0.79* 0.27*  CREATININE 0.84 0.85  --  0.82  --   --    < > = values in this interval not displayed.     Estimated Creatinine Clearance: 90.6 mL/min (by C-G formula based on SCr of 0.82 mg/dL).   Assessment: AC/Heme: heparin drip for new PE on CT (and suspicious for DVT). But Dopplers negative for DVT.   Goal of Therapy:  Heparin level 0.3-0.7 units/ml Monitor platelets by anticoagulation protocol: Yes   Plan:  -Heparin level 0.27 - subtherapeutic on heparin infusion at 1600 units/hr (previously reduced for supratherapeutic level at 1700 units/hr) -Increase heparin infusion to 1650 units/hr -Recheck levels in ~ 6 hrs -Daily HL and [place], PharmD, BCPS Clinical Pharmacist 06/28/2022 7:25 PM

## 2022-06-29 ENCOUNTER — Inpatient Hospital Stay (HOSPITAL_COMMUNITY): Payer: BC Managed Care – PPO

## 2022-06-29 DIAGNOSIS — K56609 Unspecified intestinal obstruction, unspecified as to partial versus complete obstruction: Secondary | ICD-10-CM | POA: Diagnosis not present

## 2022-06-29 DIAGNOSIS — A419 Sepsis, unspecified organism: Secondary | ICD-10-CM | POA: Diagnosis not present

## 2022-06-29 DIAGNOSIS — K9131 Postprocedural partial intestinal obstruction: Secondary | ICD-10-CM | POA: Diagnosis not present

## 2022-06-29 DIAGNOSIS — Z515 Encounter for palliative care: Secondary | ICD-10-CM | POA: Diagnosis not present

## 2022-06-29 DIAGNOSIS — Z66 Do not resuscitate: Secondary | ICD-10-CM | POA: Diagnosis not present

## 2022-06-29 LAB — GLUCOSE, CAPILLARY
Glucose-Capillary: 143 mg/dL — ABNORMAL HIGH (ref 70–99)
Glucose-Capillary: 161 mg/dL — ABNORMAL HIGH (ref 70–99)
Glucose-Capillary: 152 mg/dL — ABNORMAL HIGH (ref 70–99)
Glucose-Capillary: 149 mg/dL — ABNORMAL HIGH (ref 70–99)
Glucose-Capillary: 160 mg/dL — ABNORMAL HIGH (ref 70–99)

## 2022-06-29 LAB — CBC
HCT: 42.1 % (ref 39.0–52.0)
Hemoglobin: 13.3 g/dL (ref 13.0–17.0)
MCH: 30 pg (ref 26.0–34.0)
MCHC: 31.6 g/dL (ref 30.0–36.0)
MCV: 94.8 fL (ref 80.0–100.0)
Platelets: 626 10*3/uL — ABNORMAL HIGH (ref 150–400)
RBC: 4.44 MIL/uL (ref 4.22–5.81)
RDW: 13.6 % (ref 11.5–15.5)
WBC: 37.7 10*3/uL — ABNORMAL HIGH (ref 4.0–10.5)
nRBC: 0.6 % — ABNORMAL HIGH (ref 0.0–0.2)

## 2022-06-29 LAB — COMPREHENSIVE METABOLIC PANEL
ALT: 34 U/L (ref 0–44)
AST: 25 U/L (ref 15–41)
Albumin: 2.3 g/dL — ABNORMAL LOW (ref 3.5–5.0)
Alkaline Phosphatase: 124 U/L (ref 38–126)
Anion gap: 8 (ref 5–15)
BUN: 23 mg/dL (ref 8–23)
CO2: 28 mmol/L (ref 22–32)
Calcium: 8.6 mg/dL — ABNORMAL LOW (ref 8.9–10.3)
Chloride: 107 mmol/L (ref 98–111)
Creatinine, Ser: 0.88 mg/dL (ref 0.61–1.24)
GFR, Estimated: >60 mL/min (ref >60)
Glucose, Bld: 149 mg/dL — ABNORMAL HIGH (ref 70–99)
Potassium: 3.9 mmol/L (ref 3.5–5.1)
Sodium: 143 mmol/L (ref 135–145)
Total Bilirubin: 1.1 mg/dL (ref 0.3–1.2)
Total Protein: 7 g/dL (ref 6.5–8.1)

## 2022-06-29 LAB — PHOSPHORUS: Phosphorus: 2.8 mg/dL (ref 2.5–4.6)

## 2022-06-29 LAB — HEPARIN LEVEL (UNFRACTIONATED)
Heparin Unfractionated: <0.1 IU/mL — ABNORMAL LOW (ref 0.30–0.70)
Heparin Unfractionated: 0.49 IU/mL (ref 0.30–0.70)
Heparin Unfractionated: 0.55 IU/mL (ref 0.30–0.70)

## 2022-06-29 LAB — MAGNESIUM: Magnesium: 2.4 mg/dL (ref 1.7–2.4)

## 2022-06-29 MED ORDER — TRAVASOL 10 % IV SOLN
INTRAVENOUS | Status: AC
  Filled 2022-06-29: qty 1081.2

## 2022-06-29 MED ORDER — METOPROLOL TARTRATE 5 MG/5ML IV SOLN
2.5000 mg | Freq: Four times a day (QID) | INTRAVENOUS | Status: DC | PRN
  Administered 2022-06-29 – 2022-07-03 (×8): 2.5 mg via INTRAVENOUS
  Filled 2022-06-29 (×7): qty 5

## 2022-06-29 MED ORDER — SODIUM CHLORIDE 0.45 % IV SOLN: INTRAVENOUS | Status: DC

## 2022-06-29 MED ORDER — METOPROLOL TARTRATE 5 MG/5ML IV SOLN
2.5000 mg | Freq: Once | INTRAVENOUS | Status: AC
  Administered 2022-06-29: 2.5 mg via INTRAVENOUS
  Filled 2022-06-29: qty 5

## 2022-06-29 MED ORDER — SODIUM CHLORIDE 0.9 % IV BOLUS
1000.0000 mL | Freq: Once | INTRAVENOUS | Status: AC
  Administered 2022-06-29: 1000 mL via INTRAVENOUS

## 2022-06-29 NOTE — Progress Notes (Signed)
ANTICOAGULATION CONSULT NOTE - Follow Up Consult  Pharmacy Consult for Heparin Indication: pulmonary embolus  No Known Allergies  Patient Measurements: Height: '5\' 11"'$  (180.3 cm) Weight: 78 kg (171 lb 15.3 oz) IBW/kg (Calculated) : 75.3 Heparin Dosing Weight: TBW  Vital Signs: Temp: 98 F (36.7 C) (11/23 0647) Temp Source: Oral (11/23 0647) BP: 133/101 (11/23 0647) Pulse Rate: 137 (11/23 0647)  Labs: Recent Labs    06/27/22 0433 06/27/22 1547 06/28/22 0043 06/28/22 0843 06/28/22 1800 06/29/22 0304  HGB 12.7*  --  13.2  --   --  13.3  HCT 40.1  --  42.4  --   --  42.1  PLT 648*  --  705*  --   --  626*  HEPARINUNFRC 0.20*   < > 0.42 0.79* 0.27* <0.10*  CREATININE 0.85  --  0.82  --   --  0.88   < > = values in this interval not displayed.    Estimated Creatinine Clearance: 84.4 mL/min (by C-G formula based on SCr of 0.88 mg/dL).   Medications:  Infusions:   sodium chloride     dextrose 5 % with KCl 20 mEq / L 20 mL/hr at 06/29/22 0209   heparin 1,650 Units/hr (06/29/22 0326)   methocarbamol (ROBAXIN) IV 500 mg (06/25/22 0526)   TPN ADULT (ION) 65 mL/hr at 06/28/22 1739   TPN ADULT (ION)      Assessment: 68 yoM who underwent diagnostic laparoscopy with biopsy of omental and peritoneal nodules on 11/16 (AET 1541). Pt s/p umbilical hernia repair on 06/08/22.  CT Abdomen (11/18): +PE, Venous doppler (11/18): negative bilaterally for DVT Pharmacy consulted to dose heparin for VTE treatment.   Today, 06/29/2022: 11:30 Heparin level not drawn yet Lost IV access early this morning, heparin IV site re-established and has been running without interruption in peripheral IV, rt forearm. No bleeding or infusion issues noted per RN    Goal of Therapy:  Heparin level 0.3-0.7 units/ml Monitor platelets by anticoagulation protocol: Yes   Plan:  Continue heparin IV infusion at 1650 units/hr RN to draw blood from PICC line ASAP for heparin level. Recheck Heparin level in  8 hours Daily heparin level and CBC   Gretta Arab PharmD, BCPS [facility] main pharmacy [phone] 06/29/2022 2:04 PM

## 2022-06-29 NOTE — Progress Notes (Addendum)
Advised by Day shift Agricultural consultant and Night Shift RN to call rapid response RN to aid in monitoring patient.   Patient currently has HR of 120s-140s. Has been having Sinus Tachycardia for about 36 hours, since 11/22 AM. On telemetry since 11/23 AM, no current cardiac symptoms, just 5-7 out of ten pain in the abdomen. BP maintaining elevated.   Discussed with Rapid Response RN Renford Dills) of patient's situation and about possible interventions of care for patient. Discussed recheck of VS, give Q6H PRN Metoprolol 2.'5mg'$ , PRN Morphine '2mg'$  and notify on call HCP for tonight Abigail Butts), as well as a possible x-ray for NG tube placement.   Patient's has several x-rays today to verify placement of NG tube since it was displaced earlier in the day.   NP Zebedee Iba notified of patient's status by Rapid Response in person and notified by me remotely.

## 2022-06-29 NOTE — Progress Notes (Signed)
Brief Pharmacy Note re: IV Heparin  Patient lost IV access and heparin infusion off ~0130-0330. AM heparin level <0.1 was drawn while heparin was not infusing. IV site re-established and RN has restarted IV heparin at previous rate of 1650 units/hr.    Will recheck heparin level in 8hrs.    Netta Cedars, PharmD, BCPS 06/29/2022'@11'$ /23/2023

## 2022-06-29 NOTE — Progress Notes (Signed)
Pharmacy - IV heparin  Assessment:    Please see note from Gretta Arab, PharmD earlier today for full details.  Briefly, 71 y.o. male on IV heparin for PE.  Most recent heparin level therapeutic at 0.49 after increasing heparin to 1650 units/hr No bleeding or infusion issues per RN  Plan:  Continue heparin at 1650 units/hr Recheck confirmatory level in 8 hr  Reuel Boom, PharmD, BCPS [phone] 06/29/2022, 3:50 PM

## 2022-06-29 NOTE — Progress Notes (Signed)
7 Days Post-Op   Subjective/Chief Complaint: No bowel function, ct reviewed from yesterday nothing to drain or source of wbc just more ascites and known disease, able to move around some but limited with heart rate   Objective: Vital signs in last 24 hours: Temp:  [97.9 F (36.6 C)-98.6 F (37 C)] 98 F (36.7 C) (11/23 0647) Pulse Rate:  [99-140] 137 (11/23 0647) Resp:  [18] 18 (11/23 0647) BP: (119-147)/(94-101) 133/101 (11/23 0647) SpO2:  [95 %-99 %] 96 % (11/23 0647) Last BM Date : 06/25/22  Intake/Output from previous day: 11/22 0701 - 11/23 0700 In: 1626.8 [P.O.:120; I.V.:1506.8] Out: 1500 [Urine:400; Emesis/NG output:1100] Intake/Output this shift: No intake/output data recorded.  Ab: distended incisions clean, mild tender throughout  Lab Results:  Recent Labs    06/28/22 0043 06/29/22 0304  WBC 31.2* 37.7*  HGB 13.2 13.3  HCT 42.4 42.1  PLT 705* 626*   BMET Recent Labs    06/28/22 0043 06/29/22 0304  NA 147* 143  K 3.7 3.9  CL 108 107  CO2 31 28  GLUCOSE 156* 149*  BUN 21 23  CREATININE 0.82 0.88  CALCIUM 8.6* 8.6*   PT/INR No results for input(s): "LABPROT", "INR" in the last 72 hours. ABG No results for input(s): "PHART", "HCO3" in the last 72 hours.  Invalid input(s): "PCO2", "PO2"  Studies/Results: CT ABDOMEN PELVIS W CONTRAST  Result Date: 06/28/2022 CLINICAL DATA:  Peritoneal carcinomatosis with abdominal pain. * Tracking Code: BO * EXAM: CT ABDOMEN AND PELVIS WITH CONTRAST TECHNIQUE: Multidetector CT imaging of the abdomen and pelvis was performed using the standard protocol following bolus administration of intravenous contrast. RADIATION DOSE REDUCTION: This exam was performed according to the departmental dose-optimization program which includes automated exposure control, adjustment of the mA and/or kV according to patient size and/or use of iterative reconstruction technique. CONTRAST:  134m OMNIPAQUE IOHEXOL 300 MG/ML  SOLN  COMPARISON:  CT scan 06/24/2022 FINDINGS: Lower chest: Streaky bibasilar atelectasis but no infiltrates or effusion. No pulmonary lesions. Hepatobiliary: No a Paddock lesions or intrahepatic biliary dilatation. No obvious peritoneal surface lesions. The gallbladder is unremarkable. No common bile duct dilatation. Pancreas: No mass, inflammation or ductal dilatation. Spleen: Possible peripheral peritoneal surface lesions. Adrenals/Urinary Tract: Adrenal glands and kidneys are unremarkable. No worrisome renal lesions or hydronephrosis. Stable simple 15 mm left renal cyst not requiring further evaluation or follow-up. Stomach/Bowel: The stomach contains an NG tube. The duodenum, and small bowel are unremarkable. No obstructive findings. Mild bowel wall thickening likely due to the surrounding ascitic fluid. Several areas of peritoneal carcinomatosis involving the colonic wall notably the anti mesenteric border of the hepatic flexure and splenic flexure. No obstructive findings. Vascular/Lymphatic: Stable age advanced atherosclerotic calcifications involving the aorta and branch vessels. The major venous structures are patent. Reproductive: The prostate gland and seminal vesicles are unremarkable. Other: Stable diffuse peritoneal carcinomatosis with extensive omental and peritoneal disease. Interval progressive abdominal/pelvic ascites considered large volume now. Stable left inguinal hernia containing fat. Progressive body wall edema. Musculoskeletal: No significant bony findings. IMPRESSION: 1. Stable diffuse peritoneal carcinomatosis with extensive omental and peritoneal disease. 2. Interval progressive abdominal/pelvic ascites. 3. Several areas of peritoneal carcinomatosis involving the colonic wall but no obstructive findings. 4. Stable age advanced atherosclerotic calcifications involving the aorta and branch vessels. 5. Progressive body wall edema. 6. Aortic atherosclerosis. Aortic Atherosclerosis (ICD10-I70.0).  Electronically Signed   By: PMarijo SanesM.D.   On: 06/28/2022 11:20   DG CHEST PORT 1 VIEW  Result  Date: 06/28/2022 CLINICAL DATA:  Tachycardia, leukocytosis EXAM: PORTABLE CHEST 1 VIEW COMPARISON:  Radiograph 06/23/2022 FINDINGS: New RIGHT PICC line with tip in the cavoatrial junction. NG tube in stomach. Normal cardiac silhouette. Low lung volumes. No effusion, infiltrate pneumothorax. IMPRESSION: Low lung volumes.  No acute cardiopulmonary process. Electronically Signed   By: Suzy Bouchard M.D.   On: 06/28/2022 08:10   [place] EKG SITE RITE  Result Date: 06/27/2022 If Site Rite image not attached, placement could not be confirmed due to current cardiac rhythm.   Anti-infectives: Anti-infectives (From admission, onward)    Start     Dose/Rate Route Frequency Ordered Stop   06/23/2022 0200  piperacillin-tazobactam (ZOSYN) IVPB 3.375 g  Status:  Discontinued       See Hyperspace for full Linked Orders Report.   3.375 g 12.5 mL/hr over 240 Minutes Intravenous Every 8 hours 06/17/2022 1849 06/27/22 1232   06/29/2022 1900  piperacillin-tazobactam (ZOSYN) IVPB 3.375 g       See Hyperspace for full Linked Orders Report.   3.375 g 100 mL/hr over 30 Minutes Intravenous  Once 07/04/2022 1849 06/20/2022 1940       Assessment/Plan: Post operative abdominal pain with ascites after umbilical hernia repair with mesh by Dr. Rosendo Gros 06/08/22  -POD# 7 s/p LAPAROSCOPY DIAGNOSTIC, BIOPSY OF OMENTAL AND PERITONEAL NODULES, ASPIRATION OF ASCITES FOR CYTOLOGY 11/16 Dr. Ninfa Linden - pathology and cytology resulted - moderately differentiated adenocarcinoma - CT C/A/P 11/18 showed chronic PE and right common iliac DVT - now on heparin gtt with no sign of bleeding CT showed evidence of peritoneal carcinomatosis but no obvious sign of primary tumor.  No pulmonary tumor or mets noted. Repeat ct scan shows no real change yesterday, has ascites but no real reason for increased wbc (afebrile) likely inflammatory from another  source - he has an ileus, continue NG to LIWS, NPO needs to  continue TPN - Mobilize   ID - zosyn 11/15-11/21; WBC up more to 37 today FEN -  NG LIWS, TPN started 11/21  VTE - hep gtt Foley - d/c 11/17, voiding  HTN HLD BPH Hx UTI    Cameron Rice 06/29/2022

## 2022-06-29 NOTE — Progress Notes (Signed)
PROGRESS NOTE Cameron Rice  ZOX:[id number] DOB: Nov 06, 1950 DOA: 06/30/2022 PCP: Flossie Buffy, NP   Brief Narrative/Hospital Course: 71 y.o. with history of hypertension-on Lotrel 10-20, HLD on Lipitor 40,recurrent UTI BPH history of urethral surgery who apparently had umbilical hernia repair on November 2 and since then he has been having abdominal distention and not feeling well, and having nausea vomiting x2 days and abdominal distention and pain  [facility]:CT abdomen pelvis showed significant findings of possible metastatic disease with small bowel obstruction.  Patient has had colonoscopy about a year ago that was normal.  His most recent labs in August of this year where normal but now LFTs are markedly elevated.  Alkaline phosphatase also elevated.  Surgery consulted and admitted.  S/P diagnostic laparoscopy 11/6 Dr. Ninfa Linden and biopsy> found to have peritoneal carcinomatosis CT chest/abd 06/24/22-concerning for PE w/ proximal superior lingular artery-with degree of chronicity and asymmetrically expanded appearance of the right common iliac vein with central rounded hyperattenuation extending to the level of the infrarenal IVC which persist on the delayed images suspicious for DVT> placed on heparin gtt after discussing with surgery and also seen by Dr Lindi Adie hem-onc.   Subjective: Seen and examined.  Wife is at the bedside Patient reports his abdominal distention and pain is about the same. Able to move around He did had a good flatus this morning.  No BM yet Overnight afebrile Heart rate is tachycardic in the 120s to 140s and on monitor Saturating well on room air Labs stable CMP worsened BMP Lost IV access briefly during the night NGT 1100 cc  Assessment and Plan: Principal Problem:   SBO (small bowel obstruction) (HCC) Active Problems:   Essential hypertension   Complicated UTI (urinary tract infection)   BPH (benign prostatic hyperplasia)   Pure  hypercholesterolemia   LFT elevation   Malnutrition of moderate degree  Postop abdominal pain with ascites after umbilical hernia repair with mesh Dr. Rosendo Gros 11/2 Diffuse intra-abdominal carcinomatosis with Adenocarcinoma in cytology, omental and peritoneal nodule biopsy showed metastatic adenocarcinoma Postop ileus: S/P diagnostic laparoscopy-11/16 Dr. Ninfa Linden and biopsy noted diffuse intra-abdominal carcinomatosis with studding of the small and large bowel omentum and peritoneum as well as at the level of the diaphragm. Pathology showed metastatic adenocarcinoma,cytology-showed adenocarcinoma,CEA/CA 19-9 normal. immunoprofile is not specific as to a primary site. CT chest abdomen pelvis with contrast> did not reveal any primary.Seen by oncology patient and family does not want any treatment here> wife informed they got a call from [facility] for follow-up as OP. CT ABD 11/22:stable diffuse peritoneal carcinomatosis with extensive omental and peritoneal disease, progressive abdominal/pelvic ascites, no obstructive finding, progressive body wall edema. Now on TPN. Cont pain control supportive care NGT decompression as per surgery. Remains n.p.o.  Continue to mobilize.  PE/DVT:CT concerning for PE w/ proximal superior lingular artery-with degree of chronicity and asymmetrically expanded appearance of the right common iliac vein with central rounded hyperattenuation extending to the level of the infrarenal IVC which persist on the delayed images suspicious for DVT> placed on heparin gtt after discussing with surgery and also seen by Dr Lindi Adie hem-onc.  Significant leukocytosis POA: No evidence of UTI.  Blood culture 11/16 no growth so far.  Patient was treated with  Zosyn x 6 days until 11/21.Wbc further worsening- CT Abd 11/21 -reviewed> no obvious infection present, chest x-ray,CXR 11/22- no pneumonia.  Low threshold for antibiotics if any fever or any focus of infection present. Question if it is  reactive in  the setting of patient's carcinomatosis. Recent Labs  Lab 06/25/22 0046 06/26/22 0501 06/27/22 0433 06/28/22 0043 06/29/22 0304  WBC 19.3* 24.7* 25.4* 31.2* 37.7*    Sinus tachycardia:Multifactorial: Suspect in the setting of patient's persistent ascites carcinomatosis, pain abdominal distention: HR:120s to 140s, but no fever.  Denies chest pain or shortness of breath and not needing oxygen.  No obvious infection noted ( see above).Monitor.  Hypokalemia:resolved Recent Labs  Lab 06/25/22 0046 06/26/22 0501 06/27/22 0433 06/28/22 0043 06/29/22 0304  K 3.6 3.2* 3.3* 3.7 3.9    Transaminitis with elevated ALP AST ALT> normalized Moderate malnutrition now on TPN. W/ Hypoalbuminemia> continue TPN, unable to do p.o. due to postop ileus TWS:FKCLEXN History of UTI: UA unremarkable HTN: BP stable home amlodipine-BenaZepril on hold  HLD: po meds hold  Goals of care currently full code overall prognosis does not appear bright in this unfortunate gentleman.  At this time waiting on return of bowel function, having persistent leukocytosis, tachycardia, remains on TPN IV with hydration.  Followed by general surgery.  DVT prophylaxis: Heparin gtt Code Status:   Code Status: Full Code Family Communication: plan of care discussed with patient/wife  at bedside.  Patient status is: Inpatient because of SBO Level of care: Med-Surg  Dispo: The patient is from: home            Anticipated disposition: tbd  Objective: Vitals last 24 hrs: Vitals:   06/29/22 0157 06/29/22 0600 06/29/22 0647 06/29/22 0926  BP: (!) 119/94 (!) 147/99 (!) 133/101 (!) 124/98  Pulse: (!) 128 (!) 140 (!) 137 (!) 128  Resp: '18 18 18 16  '$ Temp: 97.9 F (36.6 C) 98.2 F (36.8 C) 98 F (36.7 C) 97.7 F (36.5 C)  TempSrc: Oral Oral Oral Oral  SpO2: 97% 96% 96% 98%  Weight:      Height:       Weight change:   Physical Examination: General exam: AA,NGT+ weak,older appearing HEENT:Oral mucosa moist,  Ear/Nose WNL grossly, dentition normal. Respiratory system: bilaterally CLEAR BS, no use of accessory muscle Cardiovascular system: S1 & S2 +, regular rate, JVD neg. Gastrointestinal system: Abdomen soft, distended, hypoactive bowel sound, mild diffuse tenderness  Nervous System:Alert, awake, moving extremities and grossly nonfocal Extremities: LE ankle edema neg, lower extremities warm Skin: No rashes,no icterus. MSK: Normal muscle bulk,tone, power    Medications reviewed:  Scheduled Meds:  Chlorhexidine Gluconate Cloth  6 each Topical Daily   insulin aspart  0-15 Units Subcutaneous Q4H   sodium chloride flush  10-40 mL Intracatheter Q12H    Continuous Infusions:  sodium chloride     dextrose 5 % with KCl 20 mEq / L 20 mL/hr at 06/29/22 0209   heparin 1,650 Units/hr (06/29/22 0326)   methocarbamol (ROBAXIN) IV 500 mg (06/25/22 0526)   TPN ADULT (ION) 65 mL/hr at 06/28/22 1739   TPN ADULT (ION)      Diet Order             Diet NPO time specified Except for: Ice Chips, Sips with Meds, Other (See Comments)  Diet effective now                  Intake/Output Summary (Last 24 hours) at 06/29/2022 1255 Last data filed at 06/29/2022 0958 Gross per 24 hour  Intake 1806.82 ml  Output 1350 ml  Net 456.82 ml  Net IO Since Admission: 6,866.52 mL [06/29/22 1255]  Wt Readings from Last 3 Encounters:  06/07/2022 78 kg  04/24/22 80  kg  03/09/22 81.8 kg   Unresulted Labs (From admission, onward)     Start     Ordered   07/03/22 0500  Triglycerides  (TPN Lab Panel)  Every Monday (0500),   R      06/27/22 0919   06/30/22 8891  Basic metabolic panel  Tomorrow morning,   R       Question:  Specimen collection method  Answer:  IV Team=IV Team collect   06/29/22 0828   06/30/22 0500  Magnesium  Tomorrow morning,   R       Question:  Specimen collection method  Answer:  IV Team=IV Team collect   06/29/22 0828   06/30/22 0500  Phosphorus  Tomorrow morning,   R       Question:   Specimen collection method  Answer:  IV Team=IV Team collect   06/29/22 0828   06/29/22 1130  Heparin level (unfractionated)  Once-Timed,   TIMED       Question:  Specimen collection method  Answer:  IV Team=IV Team collect  See Hyperspace for full Linked Orders Report.   06/29/22 0402   06/29/22 0500  Comprehensive metabolic panel  (TPN Lab Panel)  Every Mon,Thu (0500),   R      06/27/22 0919   06/29/22 0500  Magnesium  (TPN Lab Panel)  Every Mon,Thu (0500),   R      06/27/22 0919   06/29/22 0500  Phosphorus  (TPN Lab Panel)  Every Mon,Thu (0500),   R      06/27/22 0919   06/29/22 0500  Heparin level (unfractionated)  Daily at 5am,   R     Question:  Specimen collection method  Answer:  IV Team=IV Team collect   06/28/22 0213   06/28/22 0500  CBC  Daily at 5am,   R      06/27/22 0907          Data Reviewed: I have personally reviewed following labs and imaging studies CBC: Recent Labs  Lab 06/23/22 0448 06/24/22 0524 06/25/22 0046 06/26/22 0501 06/27/22 0433 06/28/22 0043 06/29/22 0304  WBC 24.4* 20.7* 19.3* 24.7* 25.4* 31.2* 37.7*  NEUTROABS 22.2* 18.1* 17.1* 22.0* 22.6*  --   --   HGB 12.6* 11.7* 12.3* 12.1* 12.7* 13.2 13.3  HCT 38.6* 36.8* 37.1* 38.0* 40.1 42.4 42.1  MCV 92.3 94.1 92.5 94.3 94.8 94.4 94.8  PLT 483* 468* 507* 558* 648* 705* 694*   Basic Metabolic Panel: Recent Labs  Lab 06/24/22 0524 06/25/22 0046 06/26/22 0501 06/27/22 0433 06/28/22 0043 06/29/22 0304  NA 147* 145 147* 145 147* 143  K 3.9 3.6 3.2* 3.3* 3.7 3.9  CL 115* 113* 111 107 108 107  CO2 '28 27 29 29 31 28  '$ GLUCOSE 130* 126* 136* 122* 156* 149*  BUN '13 15 17 20 21 23  '$ CREATININE 0.77 0.72 0.84 0.85 0.82 0.88  CALCIUM 8.1* 8.3* 8.2* 8.3* 8.6* 8.6*  MG 2.2  --   --  2.5* 2.5* 2.4  PHOS  --   --   --   --  2.7 2.8  GFR: Estimated Creatinine Clearance: 84.4 mL/min (by C-G formula based on SCr of 0.88 mg/dL). Liver Function Tests: Recent Labs  Lab 06/25/22 0046 06/26/22 0501  06/27/22 0433 06/28/22 0043 06/29/22 0304  AST 139* 41 '30 26 25  '$ ALT 153* 89* 65* 48* 34  ALKPHOS 165* 144* 143* 141* 124  BILITOT 1.2 1.3* 1.3* 1.2 1.1  PROT 6.4* 6.0* 6.6  6.8 7.0  ALBUMIN 2.2* 2.1* 2.2* 2.4* 2.3*   Recent Results (from the past 240 hour(s))  Culture, blood (Routine X 2) w Reflex to ID Panel     Status: None   Collection Time: 06/24/2022  8:20 AM   Specimen: BLOOD  Result Value Ref Range Status   Specimen Description   Final    BLOOD BLOOD RIGHT HAND Performed at [facility], [place] [address], [place], [place] [address]    Special Requests   Final    BOTTLES DRAWN AEROBIC AND ANAEROBIC Blood Culture results may not be optimal due to an inadequate volume of blood received in culture bottles Performed at [place] [address], [place], [place] [address]    Culture   Final    NO GROWTH 5 DAYS Performed at [facility], [place] [address], [place], [place] [address]    Report Status 06/27/2022 FINAL  Final  Culture, blood (Routine X 2) w Reflex to ID Panel     Status: None   Collection Time: 07/01/2022  8:31 AM   Specimen: BLOOD  Result Value Ref Range Status   Specimen Description   Final    BLOOD BLOOD RIGHT ARM Performed at [place] [address], [place], [place] [address]    Special Requests   Final    BOTTLES DRAWN AEROBIC AND ANAEROBIC Blood Culture results may not be optimal due to an inadequate volume of blood received in culture bottles Performed at [place] [address], [place], [place] [address]    Culture   Final    NO GROWTH 5 DAYS Performed at [facility], [place] [address] [place], [place] [address]    Report Status 06/27/2022 FINAL  Final  Surgical pcr screen     Status: None   Collection Time: 06/30/2022 10:52 AM   Specimen: Nasal Mucosa; Nasal Swab  Result Value Ref Range Status   MRSA, PCR NEGATIVE NEGATIVE Final   Staphylococcus aureus  NEGATIVE NEGATIVE Final    Comment: (NOTE) The Xpert SA Assay (FDA approved for NASAL specimens in patients 50 years of age and older), is one component of a comprehensive surveillance program. It is not intended to diagnose infection nor to guide or monitor treatment. Performed at [facility], [place] [address], [place], [place] [address]     Antimicrobials: Anti-infectives (From admission, onward)    Start     Dose/Rate Route Frequency Ordered Stop   06/07/2022 0200  piperacillin-tazobactam (ZOSYN) IVPB 3.375 g  Status:  Discontinued       See Hyperspace for full Linked Orders Report.   3.375 g 12.5 mL/hr over 240 Minutes Intravenous Every 8 hours 06/08/2022 1849 06/27/22 1232   06/15/2022 1900  piperacillin-tazobactam (ZOSYN) IVPB 3.375 g       See Hyperspace for full Linked Orders Report.   3.375 g 100 mL/hr over 30 Minutes Intravenous  Once 06/08/2022 1849 06/28/2022 1940      Culture/Microbiology    Component Value Date/Time   SDES  07/02/2022 0831    BLOOD BLOOD RIGHT ARM Performed at [facility], [place] [address] [place], [place] [address]    [place]  06/24/2022 0831    BOTTLES DRAWN AEROBIC AND ANAEROBIC Blood Culture results may not be optimal due to an inadequate volume of blood received in culture bottles Performed at [facility], [place] [address], [place], [place] [address]  CULT  06/16/2022 0831    NO GROWTH 5 DAYS Performed at [facility], [place] [address], [place], [place] [address]    REPTSTATUS 06/27/2022 FINAL 06/25/2022 0831    Other culture-see note  Radiology Studies: CT ABDOMEN PELVIS W CONTRAST  Result Date: 06/28/2022 CLINICAL DATA:  Peritoneal carcinomatosis with abdominal pain. * Tracking Code: BO * EXAM: CT ABDOMEN AND PELVIS WITH CONTRAST TECHNIQUE: Multidetector CT imaging of the abdomen and pelvis was performed using the standard protocol following bolus administration of intravenous  contrast. RADIATION DOSE REDUCTION: This exam was performed according to the departmental dose-optimization program which includes automated exposure control, adjustment of the mA and/or kV according to patient size and/or use of iterative reconstruction technique. CONTRAST:  165m OMNIPAQUE IOHEXOL 300 MG/ML  SOLN COMPARISON:  CT scan 06/24/2022 FINDINGS: Lower chest: Streaky bibasilar atelectasis but no infiltrates or effusion. No pulmonary lesions. Hepatobiliary: No a [place] lesions or intrahepatic biliary dilatation. No obvious peritoneal surface lesions. The gallbladder is unremarkable. No common bile duct dilatation. Pancreas: No mass, inflammation or ductal dilatation. Spleen: Possible peripheral peritoneal surface lesions. Adrenals/Urinary Tract: Adrenal glands and kidneys are unremarkable. No worrisome renal lesions or hydronephrosis. Stable simple 15 mm left renal cyst not requiring further evaluation or follow-up. Stomach/Bowel: The stomach contains an NG tube. The duodenum, and small bowel are unremarkable. No obstructive findings. Mild bowel wall thickening likely due to the surrounding ascitic fluid. Several areas of peritoneal carcinomatosis involving the colonic wall notably the anti mesenteric border of the hepatic flexure and splenic flexure. No obstructive findings. Vascular/Lymphatic: Stable age advanced atherosclerotic calcifications involving the aorta and branch vessels. The major venous structures are patent. Reproductive: The prostate gland and seminal vesicles are unremarkable. Other: Stable diffuse peritoneal carcinomatosis with extensive omental and peritoneal disease. Interval progressive abdominal/pelvic ascites considered large volume now. Stable left inguinal hernia containing fat. Progressive body wall edema. Musculoskeletal: No significant bony findings. IMPRESSION: 1. Stable diffuse peritoneal carcinomatosis with extensive omental and peritoneal disease. 2. Interval progressive  abdominal/pelvic ascites. 3. Several areas of peritoneal carcinomatosis involving the colonic wall but no obstructive findings. 4. Stable age advanced atherosclerotic calcifications involving the aorta and branch vessels. 5. Progressive body wall edema. 6. Aortic atherosclerosis. Aortic Atherosclerosis (ICD10-I70.0). Electronically Signed   By: PMarijo SanesM.D.   On: 06/28/2022 11:20   DG CHEST PORT 1 VIEW  Result Date: 06/28/2022 CLINICAL DATA:  Tachycardia, leukocytosis EXAM: PORTABLE CHEST 1 VIEW COMPARISON:  Radiograph 06/23/2022 FINDINGS: New RIGHT PICC line with tip in the cavoatrial junction. NG tube in stomach. Normal cardiac silhouette. Low lung volumes. No effusion, infiltrate pneumothorax. IMPRESSION: Low lung volumes.  No acute cardiopulmonary process. Electronically Signed   By: SSuzy BouchardM.D.   On: 06/28/2022 08:10     LOS: 8 days   RAntonieta Pert MD [facility]  06/29/2022, 12:55 PM

## 2022-06-29 NOTE — Plan of Care (Signed)
  Problem: Activity: Goal: Risk for activity intolerance will decrease Outcome: Progressing   Problem: Coping: Goal: Level of anxiety will decrease Outcome: Progressing   Problem: Pain Managment: Goal: General experience of comfort will improve Outcome: Progressing   Problem: Safety: Goal: Ability to remain free from injury will improve Outcome: Progressing   

## 2022-06-29 NOTE — Progress Notes (Signed)
PHARMACY - TOTAL PARENTERAL NUTRITION CONSULT NOTE   Indication: SBO with Prolonged ileus  Patient Measurements: Height: '5\' 11"'$  (180.3 cm) Weight: 78 kg (171 lb 15.3 oz) IBW/kg (Calculated) : 75.3 TPN AdjBW (KG): 78 Body mass index is 23.98 kg/m. Usual Weight:    Assessment:  Post operative abdominal pain with ascites after umbilical hernia repair with mesh on 06/08/22.   On 11/16 he had Lap biopsy and ascites aspiration.  Now has ileus with SBO.  Glucose / Insulin: No h/o DM noted. A1C 4.8.  - CBGs improved and in range of 139-161. - SSI used: 13 units Electrolytes: Na improved (none in TPN), K, Mag (none in TPN), Phos WNL. (goal K>=4 and Mg>=2 with ileus.) CoCa 9.88 ok. Renal: Scr <1, BUN wnl Hepatic: AST/ALT, Alkphos decreased to WNL. Tbili WNL. Triglycerides 189. Albumin 2.3 Intake / Output; MIVF: Output:  NG 1100 mL/ 24 hrs, UOP not recorded, LBM noted 11/19 PO: 120 ml/24h (chips/sips) mIVF: change D5KCl @ 20 to 0.45% at [place]: 11/15: CT: Mildly dilated loops of small bowel in the left lower quadrant with scattered air-fluid levels, nonspecific for ileus versus early obstruction 11/17: Xray: ileus vs obstruction 11/18: CT:  peritoneal carcinomatosis,  partial bowel obstruction/ileus  GI Surgeries / Procedures:  11/2 hernia repair 11/6 diagnostic laparoscopy  Central access: PICC 11/21 TPN start date: 11/21  Nutritional Goals: Goal TPN rate is  85 mL/hr to provide  08 g of protein and 2224 kcals per day)  RD Assessment: Estimated Needs Total Energy Estimated Needs: 2100-2300 Total Protein Estimated Needs: 95-110g Total Fluid Estimated Needs: 2.1L/day  Current Nutrition:  NPO and TPN sips,chips NG LIWS   Plan:  Increase TPN to goal rate 47m/hr at 1800  Electrolytes in TPN: Na 08m/L, K 35 mEq/L, Ca 71m54mL, Mg 0mE110m, and Phos 10mm48m. Cl:Ac 1:2 (Unable to keep 1:1 ratio with elyte doses) Add standard MVI and trace elements to TPN Continue  Moderate q 4hr SSI and adjust as needed  Change IVF to 0.45% NaCl KVO at 1800 Monitor TPN labs on Mon/Thurs and PRN BMET, Mag, Phos tomorrow AM. Monitor closely for re-feeding.   ChrisGretta ArabmD, BCPS [facility] main pharmacy [phone]/2023 7:50 AM

## 2022-06-29 NOTE — Progress Notes (Signed)
Pharmacy - IV heparin  Assessment:    Please see note from Gretta Arab, PharmD earlier today for full details.  Briefly, 71 y.o. male on IV heparin for PE.  Heparin level = 0.55 (therapeutic) with heparin gtt @ 1650 units/hr  No complications of therapy noted  Goal of Therapy:  Heparin level 0.3-0.7 units/ml Monitor platelets by anticoagulation protocol: Yes  Plan:  Continue heparin at 1650 units/hr Daily heparin level & CBC  Leone Haven, PharmD 06/29/2022, 11:29 PM

## 2022-06-30 ENCOUNTER — Inpatient Hospital Stay (HOSPITAL_COMMUNITY): Payer: BC Managed Care – PPO

## 2022-06-30 ENCOUNTER — Encounter (HOSPITAL_COMMUNITY): Payer: Self-pay | Admitting: Family Medicine

## 2022-06-30 DIAGNOSIS — R350 Frequency of micturition: Secondary | ICD-10-CM

## 2022-06-30 DIAGNOSIS — N401 Enlarged prostate with lower urinary tract symptoms: Secondary | ICD-10-CM

## 2022-06-30 DIAGNOSIS — E440 Moderate protein-calorie malnutrition: Secondary | ICD-10-CM

## 2022-06-30 DIAGNOSIS — K567 Ileus, unspecified: Secondary | ICD-10-CM | POA: Insufficient documentation

## 2022-06-30 DIAGNOSIS — K9189 Other postprocedural complications and disorders of digestive system: Secondary | ICD-10-CM

## 2022-06-30 DIAGNOSIS — C799 Secondary malignant neoplasm of unspecified site: Secondary | ICD-10-CM | POA: Insufficient documentation

## 2022-06-30 DIAGNOSIS — K56609 Unspecified intestinal obstruction, unspecified as to partial versus complete obstruction: Secondary | ICD-10-CM

## 2022-06-30 DIAGNOSIS — C786 Secondary malignant neoplasm of retroperitoneum and peritoneum: Secondary | ICD-10-CM | POA: Insufficient documentation

## 2022-06-30 DIAGNOSIS — R7989 Other specified abnormal findings of blood chemistry: Secondary | ICD-10-CM

## 2022-06-30 DIAGNOSIS — R188 Other ascites: Secondary | ICD-10-CM

## 2022-06-30 DIAGNOSIS — I10 Essential (primary) hypertension: Secondary | ICD-10-CM | POA: Diagnosis not present

## 2022-06-30 DIAGNOSIS — R748 Abnormal levels of other serum enzymes: Secondary | ICD-10-CM

## 2022-06-30 LAB — CBC
HCT: 38.6 % — ABNORMAL LOW (ref 39.0–52.0)
Hemoglobin: 12.1 g/dL — ABNORMAL LOW (ref 13.0–17.0)
MCH: 30.1 pg (ref 26.0–34.0)
MCHC: 31.3 g/dL (ref 30.0–36.0)
MCV: 96 fL (ref 80.0–100.0)
Platelets: 588 10*3/uL — ABNORMAL HIGH (ref 150–400)
RBC: 4.02 MIL/uL — ABNORMAL LOW (ref 4.22–5.81)
RDW: 14.1 % (ref 11.5–15.5)
WBC: 43.4 10*3/uL — ABNORMAL HIGH (ref 4.0–10.5)
nRBC: 1.5 % — ABNORMAL HIGH (ref 0.0–0.2)

## 2022-06-30 LAB — ALBUMIN, PLEURAL OR PERITONEAL FLUID: Albumin, Fluid: <1.5 g/dL

## 2022-06-30 LAB — BASIC METABOLIC PANEL
Anion gap: 6 (ref 5–15)
BUN: 33 mg/dL — ABNORMAL HIGH (ref 8–23)
CO2: 29 mmol/L (ref 22–32)
Calcium: 8.7 mg/dL — ABNORMAL LOW (ref 8.9–10.3)
Chloride: 108 mmol/L (ref 98–111)
Creatinine, Ser: 0.91 mg/dL (ref 0.61–1.24)
GFR, Estimated: >60 mL/min (ref >60)
Glucose, Bld: 156 mg/dL — ABNORMAL HIGH (ref 70–99)
Potassium: 3.8 mmol/L (ref 3.5–5.1)
Sodium: 143 mmol/L (ref 135–145)

## 2022-06-30 LAB — BODY FLUID CELL COUNT WITH DIFFERENTIAL: Total Nucleated Cell Count, Fluid: 736 cu mm (ref 0–1000)

## 2022-06-30 LAB — GLUCOSE, CAPILLARY
Glucose-Capillary: 117 mg/dL — ABNORMAL HIGH (ref 70–99)
Glucose-Capillary: 137 mg/dL — ABNORMAL HIGH (ref 70–99)
Glucose-Capillary: 140 mg/dL — ABNORMAL HIGH (ref 70–99)
Glucose-Capillary: 144 mg/dL — ABNORMAL HIGH (ref 70–99)
Glucose-Capillary: 159 mg/dL — ABNORMAL HIGH (ref 70–99)
Glucose-Capillary: 160 mg/dL — ABNORMAL HIGH (ref 70–99)
Glucose-Capillary: 167 mg/dL — ABNORMAL HIGH (ref 70–99)

## 2022-06-30 LAB — HEPARIN LEVEL (UNFRACTIONATED): Heparin Unfractionated: 0.54 IU/mL (ref 0.30–0.70)

## 2022-06-30 LAB — PHOSPHORUS: Phosphorus: 3.2 mg/dL (ref 2.5–4.6)

## 2022-06-30 LAB — MAGNESIUM: Magnesium: 2.3 mg/dL (ref 1.7–2.4)

## 2022-06-30 MED ORDER — LIDOCAINE HCL 1 % IJ SOLN
INTRAMUSCULAR | Status: AC
  Administered 2022-06-30: 15 mL
  Filled 2022-06-30: qty 20

## 2022-06-30 MED ORDER — METOPROLOL TARTRATE 5 MG/5ML IV SOLN
2.5000 mg | Freq: Four times a day (QID) | INTRAVENOUS | Status: DC
  Administered 2022-06-30 – 2022-07-01 (×5): 2.5 mg via INTRAVENOUS
  Filled 2022-06-30 (×5): qty 5

## 2022-06-30 MED ORDER — TRAVASOL 10 % IV SOLN
INTRAVENOUS | Status: AC
  Filled 2022-06-30: qty 1081.2

## 2022-06-30 NOTE — Progress Notes (Signed)
PHARMACY - TOTAL PARENTERAL NUTRITION CONSULT NOTE   Indication: SBO with Prolonged ileus  Patient Measurements: Height: '5\' 11"'$  (180.3 cm) Weight: 78 kg (171 lb 15.3 oz) IBW/kg (Calculated) : 75.3 TPN AdjBW (KG): 78 Body mass index is 23.98 kg/m. Usual Weight:    Assessment:  Post operative abdominal pain with ascites after umbilical hernia repair with mesh on 06/08/22.   On 11/16 he had Lap biopsy and ascites aspiration.  Now has ileus with SBO.  Glucose / Insulin: No h/o DM noted. A1C 4.8.  - CBGs <180 - SSI used: 16 units last 24h Electrolytes: Na improved (none in TPN), Mag 2.3 (none in TPN), Phos WNL. (goal K>=4 and Mg>=2 with ileus.) CoCa 10.06 ok. Renal: Scr <1, BUN wnl Hepatic: AST/ALT, Alkphos decreased to WNL. Tbili WNL. Triglycerides 189. Albumin 2.3 Intake / Output; MIVF: Output:  NG 400 mL/ 24 hrs, UOP not recorded, LBM noted 11/19 PO: 300 ml/24h (chips/sips) mIVF: change D5KCl @ 20 to 0.45% at [place]: 11/15: CT: Mildly dilated loops of small bowel in the left lower quadrant with scattered air-fluid levels, nonspecific for ileus versus early obstruction 11/17: Xray: ileus vs obstruction 11/18: CT:  peritoneal carcinomatosis,  partial bowel obstruction/ileus  GI Surgeries / Procedures:  11/2 hernia repair 11/6 diagnostic laparoscopy  Central access: PICC 11/21 TPN start date: 11/21  Nutritional Goals: Goal TPN rate is  85 mL/hr to provide 108 g of protein and 2154 kcals per day)  RD Assessment: Estimated Needs Total Energy Estimated Needs: 2100-2300 Total Protein Estimated Needs: 95-110g Total Fluid Estimated Needs: 2.1L/day  Current Nutrition:  NPO and TPN sips,chips NG LIWS   Plan:  TPN at goal rate 60m/hr at 1800. No changes 11/24 Electrolytes in TPN: Na 07m/L, K 35 mEq/L, Ca 87m49mL, Mg 0mE287m, and Phos 10mm54m. Cl:Ac 1:2 (Unable to keep 1:1 ratio with elyte doses) Add standard MVI and trace elements to TPN Continue Moderate q 4hr SSI  and adjust as needed  Change IVF to 0.45% NaCl KVO at 1800 Monitor TPN labs on Mon/Thurs and PRN   Anatole Apollo S. RoberAlford HighlandrmD, BCPS Clinical Staff Pharmacist [email or web address]  06/30/2022 7:19 AM

## 2022-06-30 NOTE — Progress Notes (Signed)
Rapid Response Event Note   Reason for Call : High Heart Rate > 140's   Initial Focused Assessment:  Patient Alert and Oriented x4.  Patient was tired from moving but not presenting with SOB or any distress at the time he was seen.   Interventions:  Patient given prn heart rate and pain medications by the primary RN.  Plan of Care:  As discussed with Hospitalist and RN patient given heart rate medication prn as if it was scheduled tonight.  And pain medication when needed,   Event Summary:  Just asked to review the chart originally.  MD already aware of situation from previous call last night when they were placed on telemetry.  MD Notified: 2030 Call Time: 1930 Arrival Time: 2200 - visited patient  Continued to monitor throughout the night.  Last HR 114 around 0400.  Ethelene Hal, RN

## 2022-06-30 NOTE — Progress Notes (Signed)
ANTICOAGULATION CONSULT NOTE - Follow Up Consult  Pharmacy Consult for Heparin Indication:  DVT and PE  No Known Allergies  Patient Measurements: Height: '5\' 11"'$  (180.3 cm) Weight: 78 kg (171 lb 15.3 oz) IBW/kg (Calculated) : 75.3 Heparin Dosing Weight: 78 kg  Vital Signs: Temp: 97.4 F (36.3 C) (11/24 0634) Temp Source: Oral (11/24 0634) BP: 128/89 (11/24 0634) Pulse Rate: 133 (11/24 0634)  Labs: Recent Labs    06/28/22 0043 06/28/22 0843 06/29/22 0304 06/29/22 1519 06/29/22 2300 06/30/22 0508  HGB 13.2  --  13.3  --   --  12.1*  HCT 42.4  --  42.1  --   --  38.6*  PLT 705*  --  626*  --   --  588*  HEPARINUNFRC 0.42   < > <0.10* 0.49 0.55 0.54  CREATININE 0.82  --  0.88  --   --  0.91   < > = values in this interval not displayed.    Estimated Creatinine Clearance: 81.6 mL/min (by C-G formula based on SCr of 0.91 mg/dL).   Assessment: AC/Heme: heparin drip for new PE on CT (and suspicious for right common iliac DVT ). Dopplers negative for DVT. - Hep level 0.54 in goal. Hgb 12.1 down, Plts 588 remain elevated but trending down.  Goal of Therapy:  Heparin level 0.3-0.7 units/ml Monitor platelets by anticoagulation protocol: Yes   Plan:  Con't IV heparin at 1650 units/hr Daily HL and CBC   Dean Wonder S. Alford Highland, PharmD, BCPS Clinical Staff Pharmacist [email or web address] Alford Highland, [place] 06/30/2022,7:27 AM

## 2022-06-30 NOTE — Procedures (Signed)
PROCEDURE SUMMARY:  Successful US guided paracentesis from right lateral abdomen.  Yielded 4.2 liters of amber fluid.  No immediate complications.  Patient tolerated well.  EBL = trace  Specimen was sent for labs.  Nehemiah Mcfarren S Kandise Riehle PA-C 06/30/2022 4:10 PM

## 2022-06-30 NOTE — Progress Notes (Signed)
Patient given Metoprolol PRN twice during the 11/23 Night shift. Discussed with Rapid response and HCP to further discuss interventions for patient's heart rate. Advised to give PRN metoprolol, which is helping to lower patient's HR temporarily.   The first PRN dose approx. 2100 lowered the patient's heart rate to 120's-130's, when given with morphine. Morphine was given again at approx. 0150 and metoprolol PRN was given at approx. 0350. The patient's HR is currently 110's. The lowest so far being 111.   Would recommend placing the metoprolol as scheduled Q6h IV for dayshift.   Terese Door. Helene Kelp, RN 24/Nov/2023

## 2022-06-30 NOTE — Progress Notes (Signed)
8 Days Post-Op   Subjective/Chief Complaint: Feels a little better, had some flatus   Objective: Vital signs in last 24 hours: Temp:  [97.4 F (36.3 C)-98.6 F (37 C)] 97.4 F (36.3 C) (11/24 0634) Pulse Rate:  [128-145] 133 (11/24 0634) Resp:  [16-19] 16 (11/24 0634) BP: (118-128)/(89-98) 128/89 (11/24 0634) SpO2:  [95 %-98 %] 96 % (11/24 0634) Last BM Date : 06/25/22  Intake/Output from previous day: 11/23 0701 - 11/24 0700 In: 1548.3 [P.O.:300; I.V.:1208.3; NG/GT:40] Out: 400 [Emesis/NG output:400] Intake/Output this shift: No intake/output data recorded.  Abd distended incisions clean, nontender   Lab Results:  Recent Labs    06/29/22 0304 06/30/22 0508  WBC 37.7* 43.4*  HGB 13.3 12.1*  HCT 42.1 38.6*  PLT 626* 588*   BMET Recent Labs    06/29/22 0304 06/30/22 0508  NA 143 143  K 3.9 3.8  CL 107 108  CO2 28 29  GLUCOSE 149* 156*  BUN 23 33*  CREATININE 0.88 0.91  CALCIUM 8.6* 8.7*   PT/INR No results for input(s): "LABPROT", "INR" in the last 72 hours. ABG No results for input(s): "PHART", "HCO3" in the last 72 hours.  Invalid input(s): "PCO2", "PO2"  Studies/Results: DG Abd Portable 1V  Result Date: 06/29/2022 CLINICAL DATA:  [id number] Encounter for imaging study to confirm nasogastric (NG) tube placement [id number] EXAM: PORTABLE ABDOMEN - 1 VIEW COMPARISON:  Chest x-ray 06/29/2022, CT abdomen pelvis 06/28/2022 FINDINGS: Interval advancement of an enteric tube coursing below the hemidiaphragm with tip and side port overlying the expected region the gastric lumen. Nonobstructive bowel gas pattern of visualized upper abdomen. The heart and mediastinal contours are unchanged. Aortic calcification. Low lung volumes. Persistent retrocardiac opacity that likely represents linear atelectasis for scarring better evaluated on CT abdomen pelvis 06/28/2022. No pulmonary edema. No pleural effusion. No pneumothorax. No acute osseous abnormality. IMPRESSION: 1. Enteric  tube in good position. 2. Low lung volumes. 3. Nonobstructive bowel gas pattern. Electronically Signed   By: Iven Finn M.D.   On: 06/29/2022 18:21   DG Abd Portable 1V  Result Date: 06/29/2022 CLINICAL DATA:  Nasogastric tube evaluation. EXAM: PORTABLE ABDOMEN - 1 VIEW COMPARISON:  None Available. FINDINGS: The bowel gas pattern is normal. Nasogastric tube with distal tip at the GE junction it could be 8-10 cm. Low lung volumes with bibasilar atelectasis. IMPRESSION: Nasogastric tube with distal tip at the GE junction, it could be advanced 8-10 cm. Electronically Signed   By: Keane Police D.O.   On: 06/29/2022 17:39   DG Abd Portable 1V  Result Date: 06/29/2022 CLINICAL DATA:  NG tube placement. EXAM: PORTABLE ABDOMEN - 1 VIEW COMPARISON:  06/26/2022 FINDINGS: Enteric/NG tube is noted with tip overlying the UPPER mediastinum. It is difficult to determine if this lies within the esophagus or trachea on this study. Recommend repositioning with clinical correlation. No other changes identified.  There is no evidence of pneumothorax. IMPRESSION: Enteric/NG tube with tip overlying the UPPER mediastinum, which may lie within the esophagus but recommend repositioning with clinical correlation. Electronically Signed   By: Margarette Canada M.D.   On: 06/29/2022 17:26   CT ABDOMEN PELVIS W CONTRAST  Result Date: 06/28/2022 CLINICAL DATA:  Peritoneal carcinomatosis with abdominal pain. * Tracking Code: BO * EXAM: CT ABDOMEN AND PELVIS WITH CONTRAST TECHNIQUE: Multidetector CT imaging of the abdomen and pelvis was performed using the standard protocol following bolus administration of intravenous contrast. RADIATION DOSE REDUCTION: This exam was performed according to the departmental dose-optimization  program which includes automated exposure control, adjustment of the mA and/or kV according to patient size and/or use of iterative reconstruction technique. CONTRAST:  150m OMNIPAQUE IOHEXOL 300 MG/ML  SOLN  COMPARISON:  CT scan 06/24/2022 FINDINGS: Lower chest: Streaky bibasilar atelectasis but no infiltrates or effusion. No pulmonary lesions. Hepatobiliary: No a P[place]ock lesions or intrahepatic biliary dilatation. No obvious peritoneal surface lesions. The gallbladder is unremarkable. No common bile duct dilatation. Pancreas: No mass, inflammation or ductal dilatation. Spleen: Possible peripheral peritoneal surface lesions. Adrenals/Urinary Tract: Adrenal glands and kidneys are unremarkable. No worrisome renal lesions or hydronephrosis. Stable simple 15 mm left renal cyst not requiring further evaluation or follow-up. Stomach/Bowel: The stomach contains an NG tube. The duodenum, and small bowel are unremarkable. No obstructive findings. Mild bowel wall thickening likely due to the surrounding ascitic fluid. Several areas of peritoneal carcinomatosis involving the colonic wall notably the anti mesenteric border of the hepatic flexure and splenic flexure. No obstructive findings. Vascular/Lymphatic: Stable age advanced atherosclerotic calcifications involving the aorta and branch vessels. The major venous structures are patent. Reproductive: The prostate gland and seminal vesicles are unremarkable. Other: Stable diffuse peritoneal carcinomatosis with extensive omental and peritoneal disease. Interval progressive abdominal/pelvic ascites considered large volume now. Stable left inguinal hernia containing fat. Progressive body wall edema. Musculoskeletal: No significant bony findings. IMPRESSION: 1. Stable diffuse peritoneal carcinomatosis with extensive omental and peritoneal disease. 2. Interval progressive abdominal/pelvic ascites. 3. Several areas of peritoneal carcinomatosis involving the colonic wall but no obstructive findings. 4. Stable age advanced atherosclerotic calcifications involving the aorta and branch vessels. 5. Progressive body wall edema. 6. Aortic atherosclerosis. Aortic Atherosclerosis (ICD10-I70.0).  Electronically Signed   By: PMarijo SanesM.D.   On: 06/28/2022 11:20    Anti-infectives: Anti-infectives (From admission, onward)    Start     Dose/Rate Route Frequency Ordered Stop   06/24/2022 0200  piperacillin-tazobactam (ZOSYN) IVPB 3.375 g  Status:  Discontinued       See Hyperspace for full Linked Orders Report.   3.375 g 12.5 mL/hr over 240 Minutes Intravenous Every 8 hours 06/28/2022 1849 06/27/22 1232   06/25/2022 1900  piperacillin-tazobactam (ZOSYN) IVPB 3.375 g       See Hyperspace for full Linked Orders Report.   3.375 g 100 mL/hr over 30 Minutes Intravenous  Once 06/09/2022 1849 06/09/2022 1940       Assessment/Plan: Post operative abdominal pain with ascites after umbilical hernia repair with mesh by Dr. RRosendo Gros11/2/23  -POD# 8 s/p LAPAROSCOPY DIAGNOSTIC, BIOPSY OF OMENTAL AND PERITONEAL NODULES, ASPIRATION OF ASCITES FOR CYTOLOGY 11/16 Dr. BNinfa Linden- pathology and cytology resulted - moderately differentiated adenocarcinoma - CT C/A/P 11/18 showed chronic PE and right common iliac DVT - now on heparin gtt with no sign of bleeding CT showed evidence of peritoneal carcinomatosis but no obvious sign of primary tumor.  No pulmonary tumor or mets noted. Repeat ct scan shows no real change yesterday, has ascites but no real reason for increased wbc (afebrile) likely inflammatory from another source. Does not look obstructed on imaging,   - he has an ileus, continue NG to LIWS, NPO needs to continue TPN, not sure of when this will resolve.   - Mobilize   ID - wbc increased again today FEN -  NG LIWS, TPN started 11/21  VTE - hep gtt Foley - d/c 11/17, voiding  HTN HLD BPH Hx UTI   MLily Kernen11/24/2023

## 2022-06-30 NOTE — Progress Notes (Signed)
Nutrition Follow-up  DOCUMENTATION CODES:   Non-severe (moderate) malnutrition in context of acute illness/injury  INTERVENTION:   -TPN management per Pharmacy   -Recommend daily weights while on TPN -still not recorded since 11/16   -Will monitor for diet advancement  NUTRITION DIAGNOSIS:   Moderate Malnutrition related to acute illness (SBO following urethral surgery) as evidenced by energy intake < 75% for > 7 days, mild fat depletion, mild muscle depletion.  Ongoing.  GOAL:   Patient will meet greater than or equal to 90% of their needs  Progressing with TPN  MONITOR:   Diet advancement, Labs, Weight trends, I & O's (TPN)  ASSESSMENT:   71 y.o. with history of hypertension-on Lotrel 10-20, HLD on Lipitor 40,recurrent UTI BPH history of urethral surgery who apparently had umbilical hernia repair on November 2 and since then he has been having abdominal distention and not feeling well, and having nausea vomiting x2 days and abdominal distention and pain  11/15 admitted, NPO 11/16: s/p dx lap 11/21: TPN initiated   Patient continues to be NPO. NGT still in place, output this AM: 600 ml.  TPN advancing to goal of 85 ml/hr tonight, providing 2154 kcals and 108g protein.  Last recorded weight 11/16: 171 lbs. Needs daily weights.  Medications: Zofran  Labs reviewed: CBGs: 137-167   Diet Order:   Diet Order             Diet NPO time specified Except for: Ice Chips, Sips with Meds, Other (See Comments)  Diet effective now                   EDUCATION NEEDS:   Education needs have been addressed  Skin:  Skin Assessment: Reviewed RN Assessment  Last BM:  11/19  Height:   Ht Readings from Last 1 Encounters:  06/20/2022 '5\' 11"'$  (1.803 m)    Weight:   Wt Readings from Last 1 Encounters:  06/09/2022 78 kg    BMI:  Body mass index is 23.98 kg/m.  Estimated Nutritional Needs:   Kcal:  2100-2300  Protein:  95-110g  Fluid:  2.1L/day   Clayton Bibles, MS, RD, LDN Inpatient Clinical Dietitian Contact information available via Amion

## 2022-06-30 NOTE — Progress Notes (Signed)
PROGRESS NOTE  Cameron Rice:[id number] DOB: 11-24-1950   PCP: Flossie Buffy, NP  Patient is from: Home  DOA: 07/03/2022 LOS: 37  Chief complaints Chief Complaint  Patient presents with   Abdominal Pain   Emesis     Brief Narrative / Interim history: 71 year old M with PMH of HTN, BPH, HLD, possible G6PD deficiency and recent umbilical hernia repair on 11/2 presenting with abdominal distention since his hernia repair and some nausea and vomiting for 2 days.  CT abdomen and pelvis concerning for metastatic disease with small bowel obstruction.  General surgery consulted.  He underwent diagnostic laparoscopy with biopsy on 11/16, and found to have peritoneal carcinomatosis.  Pathology confirmed metastatic adenocarcinoma of unknown primary.  CEA and CA 19-9 within normal.  Hematology/oncology consulted.  Patient had CT chest/abdomen on 06/14/2022 that showed PE within proximal superior lingual artery eccentric suggesting degree of chronicity and possible right common iliac vein DVT.  He was started on IV heparin for anticoagulation.  Hospital course complicated by postop ileus requiring NG tube decompression and TPN for feeding.  He also have rising leukocytosis with left shift felt to be inflammatory response versus infection.  Subjective: Seen and examined earlier this morning.  Reports feeling better today.  He has 4/10 abdominal pain.  Reports passing gas.  NG tube in place.  Denies urinary symptoms.  He was tachycardic to 130s.  Rising leukocytosis but no fever.  Objective: Vitals:   06/29/22 2039 06/30/22 0150 06/30/22 0634 06/30/22 0936  BP: (!) 127/92 (!) 123/94 128/89 (!) 128/96  Pulse: (!) 141 (!) 134 (!) 133 (!) 124  Resp: '16 16 16 16  '$ Temp: 98.5 F (36.9 C) 98.3 F (36.8 C) (!) 97.4 F (36.3 C)   TempSrc: Oral Oral Oral   SpO2: 96% 96% 96% 97%  Weight:      Height:        Examination:  GENERAL: Appears frail and chronically ill. HEENT: MMM.  Vision  and hearing grossly intact.  NGT in place. NECK: Supple.  No apparent JVD.  RESP:  No IWOB.  Fair aeration bilaterally. CVS:  RRR. Heart sounds normal.  ABD/GI/GU: BS+. Abd somewhat distended but nontender. MSK/EXT:  Moves extremities. No apparent deformity. No edema.  SKIN: no apparent skin lesion or wound NEURO: Awake, alert and oriented appropriately.  No apparent focal neuro deficit. PSYCH: Calm. Normal affect.   Procedures:  11/2-umbilical hernia repair with mesh by Dr. Rosendo Gros 11/16-diagnostic laparoscopy with biopsy by Dr. Ninfa Linden  Microbiology summarized: 11/16-MRSA PCR nonreactive 11/16-blood cultures NGTD  Assessment and plan: Principal Problem:   SBO (small bowel obstruction) ([place]) Active Problems:   Essential hypertension   Complicated UTI (urinary tract infection)   BPH (benign prostatic hyperplasia)   Pure hypercholesterolemia   LFT elevation   Malnutrition of moderate degree  SBO due to peritoneal carcinomatosis of metastatic adenocarcinoma of unknown primary Postop ileus -S/p diagnostic laparoscopy by Dr. Ninfa Linden on 11/16 -CEA and CA 19-9 within normal. -NG tube for bowel decompression and TPN for feeding -Surgery following. -Ultimate goal is follow-up with oncology team at [facility] on discharge -Mobilize patient as able    PE/DVT: PE with an proximal superior lingular artery.  Appears chronic.  Possible DVT with right common iliac vein with central rounded hyperattenuation extending to the level of the infrarenal IVC  -On IV heparin per hematology/oncology,  Dr Lindi Adie hem-onc.   Leukocytosis with bandemia: Rising leukocytosis with left shift.  Discussed with hematology and ID on 11/24.  Felt to be inflammatory response infection.  He has no fever.  He completed 6 days of IV Zosyn on 11/21.  -Diagnostic paracentesis to exclude SBP  Sinus tachycardia:Multifactoria including persistent ascites with carcinomatosis, abdominal distention, pain and  deconditioning.  -Scheduled metoprolol 2.5 mg every 6 hours with additional as needed   Transaminitis/bilirubinemia/elevated alkaline phosphatase: Resolved  BPH without LUTS -Monitor urine output -Resume Flomax when able to take p.o.  Essential hypertension: -IV metoprolol as above -Holding Lotrel while n.p.o.  HLD:  -Hold Lipitor while n.p.o.   Goals of care: Overall prognosis is concerning but difficult to determine for sure until we know primary source of cancer and treatment options.  Moderate malnutrition Body mass index is 23.98 kg/m. Nutrition Problem: Moderate Malnutrition Etiology: acute illness (SBO following urethral surgery) Signs/Symptoms: energy intake < 75% for > 7 days, mild fat depletion, mild muscle depletion Interventions: TPN   DVT prophylaxis:  On heparin drip for PE and DVT  Code Status: Full code Family Communication: Updated patient's wife at bedside Level of care: Med-Surg Status is: Inpatient Remains inpatient appropriate because: Postop ileus requiring NG tube decompression and TPN feeding   Final disposition: TBD after PT/OT eval Consultants:  General surgery Hematology/oncology Infectious disease  Sch Meds:  Scheduled Meds:  Chlorhexidine Gluconate Cloth  6 each Topical Daily   insulin aspart  0-15 Units Subcutaneous Q4H   metoprolol tartrate  2.5 mg Intravenous Q6H   sodium chloride flush  10-40 mL Intracatheter Q12H   Continuous Infusions:  sodium chloride 10 mL/hr at 06/30/22 0159   heparin 1,650 Units/hr (06/30/22 1150)   methocarbamol (ROBAXIN) IV 500 mg (06/25/22 0526)   TPN ADULT (ION) 85 mL/hr at 06/29/22 1730   TPN ADULT (ION)     PRN Meds:.menthol-cetylpyridinium, methocarbamol (ROBAXIN) IV, metoprolol tartrate, morphine injection, ondansetron **OR** ondansetron (ZOFRAN) IV, phenol, prochlorperazine, sodium chloride flush  Antimicrobials: Anti-infectives (From admission, onward)    Start     Dose/Rate Route Frequency  Ordered Stop   07/04/2022 0200  piperacillin-tazobactam (ZOSYN) IVPB 3.375 g  Status:  Discontinued       See Hyperspace for full Linked Orders Report.   3.375 g 12.5 mL/hr over 240 Minutes Intravenous Every 8 hours 07/04/2022 1849 06/27/22 1232   06/26/2022 1900  piperacillin-tazobactam (ZOSYN) IVPB 3.375 g       See Hyperspace for full Linked Orders Report.   3.375 g 100 mL/hr over 30 Minutes Intravenous  Once 06/20/2022 1849 06/29/2022 1940        I have personally reviewed the following labs and images: CBC: Recent Labs  Lab 06/24/22 0524 06/25/22 0046 06/26/22 0501 06/27/22 0433 06/28/22 0043 06/29/22 0304 06/30/22 0508  WBC 20.7* 19.3* 24.7* 25.4* 31.2* 37.7* 43.4*  NEUTROABS 18.1* 17.1* 22.0* 22.6*  --   --   --   HGB 11.7* 12.3* 12.1* 12.7* 13.2 13.3 12.1*  HCT 36.8* 37.1* 38.0* 40.1 42.4 42.1 38.6*  MCV 94.1 92.5 94.3 94.8 94.4 94.8 96.0  PLT 468* 507* 558* 648* 705* 626* 588*   BMP &GFR Recent Labs  Lab 06/24/22 0524 06/25/22 0046 06/26/22 0501 06/27/22 0433 06/28/22 0043 06/29/22 0304 06/30/22 0508  NA 147*   < > 147* 145 147* 143 143  K 3.9   < > 3.2* 3.3* 3.7 3.9 3.8  CL 115*   < > 111 107 108 107 108  CO2 28   < > '29 29 31 28 29  '$ GLUCOSE 130*   < > 136* 122* 156* 149*  156*  BUN 13   < > '17 20 21 23 '$ 33*  CREATININE 0.77   < > 0.84 0.85 0.82 0.88 0.91  CALCIUM 8.1*   < > 8.2* 8.3* 8.6* 8.6* 8.7*  MG 2.2  --   --  2.5* 2.5* 2.4 2.3  PHOS  --   --   --   --  2.7 2.8 3.2   < > = values in this interval not displayed.   Estimated Creatinine Clearance: 81.6 mL/min (by C-G formula based on SCr of 0.91 mg/dL). Liver & Pancreas: Recent Labs  Lab 06/25/22 0046 06/26/22 0501 06/27/22 0433 06/28/22 0043 06/29/22 0304  AST 139* 41 '30 26 25  '$ ALT 153* 89* 65* 48* 34  ALKPHOS 165* 144* 143* 141* 124  BILITOT 1.2 1.3* 1.3* 1.2 1.1  PROT 6.4* 6.0* 6.6 6.8 7.0  ALBUMIN 2.2* 2.1* 2.2* 2.4* 2.3*   No results for input(s): "LIPASE", "AMYLASE" in the last 168  hours. No results for input(s): "AMMONIA" in the last 168 hours. Diabetic: No results for input(s): "HGBA1C" in the last 72 hours. Recent Labs  Lab 06/29/22 2042 06/30/22 0030 06/30/22 0350 06/30/22 0732 06/30/22 1140  GLUCAP 160* 167* 137* 160* 159*   Cardiac Enzymes: No results for input(s): "CKTOTAL", "CKMB", "CKMBINDEX", "TROPONINI" in the last 168 hours. No results for input(s): "PROBNP" in the last 8760 hours. Coagulation Profile: No results for input(s): "INR", "PROTIME" in the last 168 hours. Thyroid Function Tests: No results for input(s): "TSH", "T4TOTAL", "FREET4", "T3FREE", "THYROIDAB" in the last 72 hours. Lipid Profile: Recent Labs    06/28/22 0043  TRIG 189*   Anemia Panel: No results for input(s): "VITAMINB12", "FOLATE", "FERRITIN", "TIBC", "IRON", "RETICCTPCT" in the last 72 hours. Urine analysis:    Component Value Date/Time   COLORURINE AMBER (A) 07/01/2022 1542   APPEARANCEUR CLEAR 06/25/2022 1542   LABSPEC >=1.030 06/30/2022 1542   PHURINE 5.5 07/01/2022 1542   GLUCOSEU NEGATIVE 06/15/2022 1542   HGBUR NEGATIVE 06/29/2022 1542   BILIRUBINUR MODERATE (A) 06/18/2022 1542   BILIRUBINUR negative 07/04/2021 0936   BILIRUBINUR negative 12/22/2020 0902   KETONESUR 40 (A) 06/27/2022 1542   PROTEINUR 100 (A) 06/14/2022 1542   UROBILINOGEN 0.2 07/04/2021 0936   NITRITE POSITIVE (A) 06/26/2022 1542   LEUKOCYTESUR NEGATIVE 06/12/2022 1542   Sepsis Labs: Invalid input(s): "PROCALCITONIN", "LACTICIDVEN"  Microbiology: Recent Results (from the past 240 hour(s))  Culture, blood (Routine X 2) w Reflex to ID Panel     Status: None   Collection Time: 06/11/2022  8:20 AM   Specimen: BLOOD  Result Value Ref Range Status   Specimen Description   Final    BLOOD BLOOD RIGHT HAND Performed at [facility], [place] [address], [place], [place] [address]    Special Requests   Final    BOTTLES DRAWN AEROBIC AND ANAEROBIC Blood Culture results may not  be optimal due to an inadequate volume of blood received in culture bottles Performed at [facility] [address] [place], [place] [address]    Culture   Final    NO GROWTH 5 DAYS Performed at [facility], [place] [address] [place], [name] [address]    Report Status 06/27/2022 FINAL  Final  Culture, blood (Routine X 2) w Reflex to ID Panel     Status: None   Collection Time: 07/01/2022  8:31 AM   Specimen: BLOOD  Result Value Ref Range Status   Specimen Description   Final  BLOOD BLOOD RIGHT ARM Performed at [facility] [address], [place], [place] [address]    Special Requests   Final    BOTTLES DRAWN AEROBIC AND ANAEROBIC Blood Culture results may not be optimal due to an inadequate volume of blood received in culture bottles Performed at [facility] [address] [place], [place] [address]    Culture   Final    NO GROWTH 5 DAYS Performed at [facility], [place] [address] [place], [place] [address]    Report Status 06/27/2022 FINAL  Final  Surgical pcr screen     Status: None   Collection Time: 07/04/2022 10:52 AM   Specimen: Nasal Mucosa; Nasal Swab  Result Value Ref Range Status   MRSA, PCR NEGATIVE NEGATIVE Final   Staphylococcus aureus NEGATIVE NEGATIVE Final    Comment: (NOTE) The Xpert SA Assay (FDA approved for NASAL specimens in patients 17 years of age and older), is one component of a comprehensive surveillance program. It is not intended to diagnose infection nor to guide or monitor treatment. Performed at [facility], [place] [address], [place],  [address]     Radiology Studies: DG Abd Portable 1V  Result Date: 06/29/2022 CLINICAL DATA:  [id number] Encounter for imaging study to confirm nasogastric (NG) tube placement [id number] EXAM: PORTABLE ABDOMEN - 1 VIEW COMPARISON:  Chest x-ray 06/29/2022, CT abdomen pelvis 06/28/2022 FINDINGS: Interval advancement of an  enteric tube coursing below the hemidiaphragm with tip and side port overlying the expected region the gastric lumen. Nonobstructive bowel gas pattern of visualized upper abdomen. The heart and mediastinal contours are unchanged. Aortic calcification. Low lung volumes. Persistent retrocardiac opacity that likely represents linear atelectasis for scarring better evaluated on CT abdomen pelvis 06/28/2022. No pulmonary edema. No pleural effusion. No pneumothorax. No acute osseous abnormality. IMPRESSION: 1. Enteric tube in good position. 2. Low lung volumes. 3. Nonobstructive bowel gas pattern. Electronically Signed   By: Iven Finn M.D.   On: 06/29/2022 18:21   DG Abd Portable 1V  Result Date: 06/29/2022 CLINICAL DATA:  Nasogastric tube evaluation. EXAM: PORTABLE ABDOMEN - 1 VIEW COMPARISON:  None Available. FINDINGS: The bowel gas pattern is normal. Nasogastric tube with distal tip at the GE junction it could be 8-10 cm. Low lung volumes with bibasilar atelectasis. IMPRESSION: Nasogastric tube with distal tip at the GE junction, it could be advanced 8-10 cm. Electronically Signed   By: Keane Police D.O.   On: 06/29/2022 17:39   DG Abd Portable 1V  Result Date: 06/29/2022 CLINICAL DATA:  NG tube placement. EXAM: PORTABLE ABDOMEN - 1 VIEW COMPARISON:  06/26/2022 FINDINGS: Enteric/NG tube is noted with tip overlying the UPPER mediastinum. It is difficult to determine if this lies within the esophagus or trachea on this study. Recommend repositioning with clinical correlation. No other changes identified.  There is no evidence of pneumothorax. IMPRESSION: Enteric/NG tube with tip overlying the UPPER mediastinum, which may lie within the esophagus but recommend repositioning with clinical correlation. Electronically Signed   By: Margarette Canada M.D.   On: 06/29/2022 17:26      Norton Bivins T. [place]  If 7PM-7AM, please contact night-coverage [email or web address] 06/30/2022, 1:04 PM

## 2022-07-01 DIAGNOSIS — K56609 Unspecified intestinal obstruction, unspecified as to partial versus complete obstruction: Secondary | ICD-10-CM | POA: Diagnosis not present

## 2022-07-01 DIAGNOSIS — R180 Malignant ascites: Secondary | ICD-10-CM

## 2022-07-01 DIAGNOSIS — N401 Enlarged prostate with lower urinary tract symptoms: Secondary | ICD-10-CM | POA: Diagnosis not present

## 2022-07-01 DIAGNOSIS — E440 Moderate protein-calorie malnutrition: Secondary | ICD-10-CM | POA: Diagnosis not present

## 2022-07-01 DIAGNOSIS — I10 Essential (primary) hypertension: Secondary | ICD-10-CM | POA: Diagnosis not present

## 2022-07-01 LAB — GLUCOSE, CAPILLARY
Glucose-Capillary: 140 mg/dL — ABNORMAL HIGH (ref 70–99)
Glucose-Capillary: 131 mg/dL — ABNORMAL HIGH (ref 70–99)
Glucose-Capillary: 127 mg/dL — ABNORMAL HIGH (ref 70–99)
Glucose-Capillary: 143 mg/dL — ABNORMAL HIGH (ref 70–99)
Glucose-Capillary: 137 mg/dL — ABNORMAL HIGH (ref 70–99)

## 2022-07-01 LAB — CBC
HCT: 38.5 % — ABNORMAL LOW (ref 39.0–52.0)
Hemoglobin: 12.3 g/dL — ABNORMAL LOW (ref 13.0–17.0)
MCH: 30.2 pg (ref 26.0–34.0)
MCHC: 31.9 g/dL (ref 30.0–36.0)
MCV: 94.6 fL (ref 80.0–100.0)
Platelets: 438 10*3/uL — ABNORMAL HIGH (ref 150–400)
RBC: 4.07 MIL/uL — ABNORMAL LOW (ref 4.22–5.81)
RDW: 14.4 % (ref 11.5–15.5)
WBC: 42.3 10*3/uL — ABNORMAL HIGH (ref 4.0–10.5)
nRBC: 2.1 % — ABNORMAL HIGH (ref 0.0–0.2)

## 2022-07-01 LAB — HEPARIN LEVEL (UNFRACTIONATED)
Heparin Unfractionated: 0.26 IU/mL — ABNORMAL LOW (ref 0.30–0.70)
Heparin Unfractionated: 0.35 IU/mL (ref 0.30–0.70)

## 2022-07-01 MED ORDER — HEPARIN (PORCINE) 25000 UT/250ML-% IV SOLN
1900.0000 [IU]/h | INTRAVENOUS | Status: DC
  Administered 2022-07-01 (×2): 1750 [IU]/h via INTRAVENOUS
  Administered 2022-07-02 – 2022-07-03 (×2): 1900 [IU]/h via INTRAVENOUS
  Filled 2022-07-01 (×4): qty 250

## 2022-07-01 MED ORDER — TRAVASOL 10 % IV SOLN
INTRAVENOUS | Status: AC
  Filled 2022-07-01: qty 1081.2

## 2022-07-01 MED ORDER — TAMSULOSIN HCL 0.4 MG PO CAPS
0.4000 mg | ORAL_CAPSULE | Freq: Every day | ORAL | Status: DC
  Administered 2022-07-01 – 2022-07-03 (×3): 0.4 mg via ORAL
  Filled 2022-07-01 (×3): qty 1

## 2022-07-01 MED ORDER — METOPROLOL TARTRATE 5 MG/5ML IV SOLN
5.0000 mg | Freq: Four times a day (QID) | INTRAVENOUS | Status: DC
  Administered 2022-07-01 – 2022-07-02 (×4): 5 mg via INTRAVENOUS
  Filled 2022-07-01 (×5): qty 5

## 2022-07-01 NOTE — Evaluation (Signed)
Physical Therapy Evaluation Patient Details Name: Cameron Rice MRN: [id number] DOB: 12-25-1950 Today's Date: 07/01/2022  History of Present Illness  71 year old M with PMH of HTN, BPH, HLD, possible G6PD deficiency and recent umbilical hernia repair on 06/08/2022  presented to ED 07/03/2022 with abdominal distention since his hernia repair and some nausea and vomiting for 2 days.  Diagnostic laparoscopy with biopsy on 11/16 found to have peritoneal carcinomatosis.  Pathology confirmed metastatic adenocarcinoma of unknown primary.   06/14/2022 t found with PE within proximal superior lingual artery eccentric suggesting degree of chronicity and possible right common iliac vein DVT.  He was started on IV heparin for anticoagulation.     Hospital course complicated by postop ileus requiring NG tube decompression and TPN for feeding.  Clinical Impression  Pt very pleasant and willing to work with PT. Pt has been walking with his wife in the hallways, but past 2 days pt has had high HR ( tachy) so they were told to hold off. Pt does present still tacky at rest 134bpm however with slow walking pt HR only increased 149bpm. Pt was not symptomatic and was told to just go slow and easy. Pt still reports feeling very weak and fatigues easily with walking. Tried the RW today and this gave him a little more support and stability rather than the IV pole at this time. Wife is very support and helpful with the patient and feel once pt is DC they will not need f/u PT or HHPT at this time.      Pt stated he would like to walk 2 x a day and will also place on the mobility specialist list as well.    Recommendations for follow up therapy are one component of a multi-disciplinary discharge planning process, led by the attending physician.  Recommendations may be updated based on patient status, additional functional criteria and insurance authorization.  Follow Up Recommendations No PT follow up      Assistance  Recommended at Discharge Intermittent Supervision/Assistance  Patient can return home with the following  A little help with walking and/or transfers;Assist for transportation;Help with stairs or ramp for entrance    Equipment Recommendations None recommended by PT (pt's wife states she may be able to borrow a RW)  Recommendations for Other Services       Functional Status Assessment Patient has had a recent decline in their functional status and demonstrates the ability to make significant improvements in function in a reasonable and predictable amount of time.     Precautions / Restrictions Precautions Precaution Comments: NG tube and TPN Restrictions Weight Bearing Restrictions: No      Mobility  Bed Mobility Overal bed mobility: Needs Assistance Bed Mobility: Supine to Sit, Sit to Supine     Supine to sit: Min assist, HOB elevated Sit to supine: Min assist, HOB elevated   General bed mobility comments: used rail and  assit trunk to sitting with pt pulling with his arm on my arm to upright himself. HOB elevated as well.    Transfers Overall transfer level: Needs assistance Equipment used: Rolling walker (2 wheels) Transfers: Sit to/from Stand Sit to Stand: Min guard                Ambulation/Gait Ambulation/Gait assistance: Min guard Gait Distance (Feet): 400 Feet Assistive device: Rolling walker (2 wheels) Gait Pattern/deviations: Step-through pattern Gait velocity: slow     General Gait Details: slow and steady, 3 standing rest breaks and cues for RW  to step cloer to the RW. Pt had been using IV to push , but more steady at this time with the RW.  Stairs            Wheelchair Mobility    Modified Rankin (Stroke Patients Only)       Balance Overall balance assessment: Modified Independent                                           Pertinent Vitals/Pain Pain Assessment Pain Assessment: Faces Faces Pain Scale: Hurts little  more Pain Location: belly with movement Pain Descriptors / Indicators: Sore, Pressure Pain Intervention(s): Limited activity within patient's tolerance, Monitored during session    Home Living Family/patient expects to be discharged to:: Private residence Living Arrangements: Spouse/significant other Available Help at Discharge: Family Type of Home: House Home Access: Stairs to enter Entrance Stairs-Rails: Right Entrance Stairs-Number of Steps: 3   Home Layout: One level Home Equipment: None (wife states she can get one if need)      Prior Function Prior Level of Function : Independent/Modified Independent             Mobility Comments: pt was very independent and loved working out and walking .       Hand Dominance        Extremity/Trunk Assessment        Lower Extremity Assessment Lower Extremity Assessment: Generalized weakness (pt able to perform all mobility , just slow and general weakness , fatiguing easily with very little mobility)       Communication   Communication: No difficulties  Cognition Arousal/Alertness: Awake/alert Behavior During Therapy: WFL for tasks assessed/performed Overall Cognitive Status: Within Functional Limits for tasks assessed                                          General Comments      Exercises Other Exercises Other Exercises: Gave pt Green Theraband for UE exercises for bicep curls and tricep extension. Demo shown to wife and pt.   Assessment/Plan    PT Assessment Patient needs continued PT services  PT Problem List Decreased strength;Decreased activity tolerance;Decreased mobility       PT Treatment Interventions Gait training;Functional mobility training;Therapeutic activities;Therapeutic exercise;Patient/family education    PT Goals (Current goals can be found in the Care Plan section)  Acute Rehab PT Goals Patient Stated Goal: I want  to get better and moving PT Goal Formulation: With  patient/family Time For Goal Achievement: 07/13/22 Potential to Achieve Goals: Good    Frequency Min 3X/week     Co-evaluation               AM-PAC PT "6 Clicks" Mobility  Outcome Measure Help needed turning from your back to your side while in a flat bed without using bedrails?: A Little Help needed moving from lying on your back to sitting on the side of a flat bed without using bedrails?: A Little Help needed moving to and from a bed to a chair (including a wheelchair)?: A Little Help needed standing up from a chair using your arms (e.g., wheelchair or bedside chair)?: A Little Help needed to walk in hospital room?: A Little Help needed climbing 3-5 steps with a railing? : A Little 6 Click Score:  18    End of Session   Activity Tolerance: Patient tolerated treatment well Patient left: in bed;with call bell/phone within reach;with family/visitor present Nurse Communication: Mobility status PT Visit Diagnosis: Muscle weakness (generalized) (M62.81)    Time: 1000-1040 PT Time Calculation (min) (ACUTE ONLY): 40 min   Charges:   PT Evaluation $PT Eval Low Complexity: 1 Low          Derran Sear, PT, MPT Acute Rehabilitation Services Office: [phone] If a weekend: [facility] w/e pager [phone] 07/01/2022   Clide Dales 07/01/2022, 1:04 PM

## 2022-07-01 NOTE — Progress Notes (Signed)
ANTICOAGULATION CONSULT NOTE - Follow Up Consult  Pharmacy Consult for Heparin Indication: pulmonary embolus  No Known Allergies  Patient Measurements: Height: '5\' 11"'$  (180.3 cm) Weight: 78.5 kg (173 lb 1 oz) IBW/kg (Calculated) : 75.3 Heparin Dosing Weight:  78.5 kg  Vital Signs: Temp: 97.7 F (36.5 C) (11/25 1141) Temp Source: Oral (11/25 1141) BP: 113/88 (11/25 1141) Pulse Rate: 134 (11/25 1246)  Labs: Recent Labs    06/29/22 0304 06/29/22 1519 06/30/22 0508 07/01/22 0319 07/01/22 0410 07/01/22 1220  HGB 13.3  --  12.1*  --  12.3*  --   HCT 42.1  --  38.6*  --  38.5*  --   PLT 626*  --  588*  --  438*  --   HEPARINUNFRC <0.10*   < > 0.54 0.26*  --  0.35  CREATININE 0.88  --  0.91  --   --   --    < > = values in this interval not displayed.    Estimated Creatinine Clearance: 81.6 mL/min (by C-G formula based on SCr of 0.91 mg/dL).  Assessment: AC/Heme: heparin drip for new PE on CT (and suspicious for right common iliac DVT ). Dopplers negative for DVT. - Hep level 12.3 stable. Plts 438 declining. Hep level -.35 now in goal range.  Goal of Therapy:  Heparin level 0.3-0.7 units/ml Monitor platelets by anticoagulation protocol: Yes   Plan:  Con't IV heparin at 1750 units/hr Daily HL and CBC   Dashawn Bartnick S. Alford Highland, PharmD, BCPS Clinical Staff Pharmacist [email or web address] Alford Highland, Haniyyah Sakuma Stillinger 07/01/2022,2:20 PM

## 2022-07-01 NOTE — Progress Notes (Signed)
PROGRESS NOTE  Cameron Rice UYQ:[id number] DOB: 01-04-1951   PCP: Flossie Buffy, NP  Patient is from: Home  DOA: 06/15/2022 LOS: 80  Chief complaints Chief Complaint  Patient presents with   Abdominal Pain   Emesis     Brief Narrative / Interim history: 71 year old M with PMH of HTN, BPH, HLD, possible G6PD deficiency and recent umbilical hernia repair on 11/2 presenting with abdominal distention since his hernia repair and some nausea and vomiting for 2 days.  CT abdomen and pelvis concerning for metastatic disease with small bowel obstruction.  General surgery consulted.  He underwent diagnostic laparoscopy with biopsy on 11/16, and found to have peritoneal carcinomatosis.  Pathology confirmed metastatic adenocarcinoma of unknown primary.  CEA and CA 19-9 within normal.  Hematology/oncology consulted.  Patient had CT chest/abdomen on 06/14/2022 that showed PE within proximal superior lingual artery eccentric suggesting degree of chronicity and possible right common iliac vein DVT.  He was started on IV heparin for anticoagulation.  Hospital course complicated by postop ileus requiring NG tube decompression and TPN for feeding.  He also have rising leukocytosis with left shift felt to be inflammatory response versus infection.  Underwent paracentesis with removal of 4.2 L on 06/30/2022.  Peritoneal fluid culture pending.  Subjective: Seen and examined earlier this morning.  No major events overnight of this morning.  Feels better today.  Reports 2 bowel movements.  Worked with therapy.  NG tube clamped.  Patient's wife at bedside.  Objective: Vitals:   07/01/22 0639 07/01/22 0823 07/01/22 1141 07/01/22 1246  BP: (!) 115/90 (!) 121/90 113/88   Pulse: (!) 111 (!) 129 (!) 138 (!) 134  Resp: 16  16   Temp: 98.7 F (37.1 C) 97.8 F (36.6 C) 97.7 F (36.5 C)   TempSrc: Oral  Oral   SpO2: 96% 97% 97%   Weight:      Height:        Examination:  GENERAL: Appears frail  and chronically ill. HEENT: MMM.  Vision and hearing grossly intact.  NG tube clamped. NECK: Supple.  No apparent JVD.  RESP:  No IWOB.  Fair aeration bilaterally. CVS:  RRR. Heart sounds normal.  ABD/GI/GU: BS+. Abd slightly distended but softer.  Nontender. MSK/EXT:  Moves extremities. No apparent deformity. No edema.  SKIN: no apparent skin lesion or wound NEURO: Awake and alert. Oriented appropriately.  No apparent focal neuro deficit. PSYCH: Calm. Normal affect.   Procedures:  11/2-umbilical hernia repair with mesh by Dr. Rosendo Gros 11/16-diagnostic laparoscopy with biopsy by Dr. Ninfa Linden 11/24-ultrasound-guided paracentesis with removal of 4.2 L  Microbiology summarized: 11/16-MRSA PCR nonreactive 11/16-blood cultures NGTD 11/24-peritoneal fluid culture pending  Assessment and plan: Principal Problem:   SBO (small bowel obstruction) ([place]) Active Problems:   Essential hypertension   Complicated UTI (urinary tract infection)   BPH (benign prostatic hyperplasia)   Pure hypercholesterolemia   LFT elevation   Malnutrition of moderate degree   Peritoneal carcinomatosis ([place])   Metastatic adenocarcinoma ([place])   Postoperative ileus ([place])  SBO due to peritoneal carcinomatosis of metastatic adenocarcinoma of unknown primary Malignant ascites-s/p US guided paracentesis with removal of 4.2 L.  Cell to degenerated for identification Postop ileus-improving.  Reports 2 bowel movements.  NG tube clamped. -S/p diagnostic laparoscopy by Dr. Ninfa Linden on 11/16 -CEA and CA 19-9 within normal. -Surgery following-clamp NG tube and started ice chips, sips with meds. -Ultimate goal is follow-up with oncology team at [facility] on discharge -Mobilize mobilize mobilize  PE/DVT: PE  with an proximal superior lingular artery.  Appears chronic.  Possible DVT with right common iliac vein with central rounded hyperattenuation extending to the level of the infrarenal IVC  -On IV heparin per heme-onc, Dr.  Lindi Adie.    Leukocytosis with bandemia: Rising leukocytosis with left shift.  Discussed with hematology and ID on 11/24.  Felt to be inflammatory response infection.  He has no fever.  He completed 6 days of IV Zosyn on 11/21.  -Follow-up peritoneal fluid culture  Sinus tachycardia:Multifactoria including persistent ascites with carcinomatosis, abdominal distention, pain and deconditioning.  -Increase IV metoprolol to 5 mg every 6 hours   Transaminitis/bilirubinemia/elevated alkaline phosphatase: Resolved  BPH without LUTS -Monitor urine output -Resume Flomax  Essential hypertension: -IV metoprolol as above -Holding Lotrel while n.p.o.  HLD:  -Hold Lipitor while n.p.o.   Goals of care: Overall prognosis is concerning but difficult to determine for sure until we know primary source of cancer and treatment options.  Moderate malnutrition Body mass index is 24.14 kg/m. Nutrition Problem: Moderate Malnutrition Etiology: acute illness (SBO following urethral surgery) Signs/Symptoms: energy intake < 75% for > 7 days, mild fat depletion, mild muscle depletion Interventions: TPN   DVT prophylaxis:  On heparin drip for PE and DVT  Code Status: Full code Family Communication: Updated patient's wife at bedside Level of care: Med-Surg Status is: Inpatient Remains inpatient appropriate because: Postop ileus    Final disposition: Home once medically cleared Consultants:  General surgery Hematology/oncology Infectious disease  Sch Meds:  Scheduled Meds:  Chlorhexidine Gluconate Cloth  6 each Topical Daily   insulin aspart  0-15 Units Subcutaneous Q4H   metoprolol tartrate  5 mg Intravenous Q6H   sodium chloride flush  10-40 mL Intracatheter Q12H   Continuous Infusions:  sodium chloride 10 mL/hr at 06/30/22 1805   heparin 1,750 Units/hr (07/01/22 0847)   methocarbamol (ROBAXIN) IV 500 mg (06/25/22 0526)   TPN ADULT (ION) 85 mL/hr at 06/30/22 1805   TPN ADULT (ION)     PRN  Meds:.menthol-cetylpyridinium, methocarbamol (ROBAXIN) IV, metoprolol tartrate, morphine injection, ondansetron **OR** ondansetron (ZOFRAN) IV, phenol, prochlorperazine, sodium chloride flush  Antimicrobials: Anti-infectives (From admission, onward)    Start     Dose/Rate Route Frequency Ordered Stop   06/28/2022 0200  piperacillin-tazobactam (ZOSYN) IVPB 3.375 g  Status:  Discontinued       See Hyperspace for full Linked Orders Report.   3.375 g 12.5 mL/hr over 240 Minutes Intravenous Every 8 hours 06/28/2022 1849 06/27/22 1232   07/04/2022 1900  piperacillin-tazobactam (ZOSYN) IVPB 3.375 g       See Hyperspace for full Linked Orders Report.   3.375 g 100 mL/hr over 30 Minutes Intravenous  Once 06/29/2022 1849 06/20/2022 1940        I have personally reviewed the following labs and images: CBC: Recent Labs  Lab 06/25/22 0046 06/26/22 0501 06/27/22 0433 06/28/22 0043 06/29/22 0304 06/30/22 0508 07/01/22 0410  WBC 19.3* 24.7* 25.4* 31.2* 37.7* 43.4* 42.3*  NEUTROABS 17.1* 22.0* 22.6*  --   --   --   --   HGB 12.3* 12.1* 12.7* 13.2 13.3 12.1* 12.3*  HCT 37.1* 38.0* 40.1 42.4 42.1 38.6* 38.5*  MCV 92.5 94.3 94.8 94.4 94.8 96.0 94.6  PLT 507* 558* 648* 705* 626* 588* 438*   BMP &GFR Recent Labs  Lab 06/26/22 0501 06/27/22 0433 06/28/22 0043 06/29/22 0304 06/30/22 0508  NA 147* 145 147* 143 143  K 3.2* 3.3* 3.7 3.9 3.8  CL 111 107  108 107 108  CO2 '29 29 31 28 29  '$ GLUCOSE 136* 122* 156* 149* 156*  BUN '17 20 21 23 '$ 33*  CREATININE 0.84 0.85 0.82 0.88 0.91  CALCIUM 8.2* 8.3* 8.6* 8.6* 8.7*  MG  --  2.5* 2.5* 2.4 2.3  PHOS  --   --  2.7 2.8 3.2   Estimated Creatinine Clearance: 81.6 mL/min (by C-G formula based on SCr of 0.91 mg/dL). Liver & Pancreas: Recent Labs  Lab 06/25/22 0046 06/26/22 0501 06/27/22 0433 06/28/22 0043 06/29/22 0304  AST 139* 41 '30 26 25  '$ ALT 153* 89* 65* 48* 34  ALKPHOS 165* 144* 143* 141* 124  BILITOT 1.2 1.3* 1.3* 1.2 1.1  PROT 6.4* 6.0* 6.6  6.8 7.0  ALBUMIN 2.2* 2.1* 2.2* 2.4* 2.3*   No results for input(s): "LIPASE", "AMYLASE" in the last 168 hours. No results for input(s): "AMMONIA" in the last 168 hours. Diabetic: No results for input(s): "HGBA1C" in the last 72 hours. Recent Labs  Lab 06/30/22 1931 06/30/22 2327 07/01/22 0349 07/01/22 0715 07/01/22 1140  GLUCAP 144* 140* 140* 131* 127*   Cardiac Enzymes: No results for input(s): "CKTOTAL", "CKMB", "CKMBINDEX", "TROPONINI" in the last 168 hours. No results for input(s): "PROBNP" in the last 8760 hours. Coagulation Profile: No results for input(s): "INR", "PROTIME" in the last 168 hours. Thyroid Function Tests: No results for input(s): "TSH", "T4TOTAL", "FREET4", "T3FREE", "THYROIDAB" in the last 72 hours. Lipid Profile: No results for input(s): "CHOL", "HDL", "LDLCALC", "TRIG", "CHOLHDL", "LDLDIRECT" in the last 72 hours.  Anemia Panel: No results for input(s): "VITAMINB12", "FOLATE", "FERRITIN", "TIBC", "IRON", "RETICCTPCT" in the last 72 hours. Urine analysis:    Component Value Date/Time   COLORURINE AMBER (A) 07/03/2022 1542   APPEARANCEUR CLEAR 06/08/2022 1542   LABSPEC >=1.030 06/17/2022 1542   PHURINE 5.5 07/03/2022 1542   GLUCOSEU NEGATIVE 07/01/2022 1542   HGBUR NEGATIVE 06/27/2022 1542   BILIRUBINUR MODERATE (A) 07/06/2022 1542   BILIRUBINUR negative 07/04/2021 0936   BILIRUBINUR negative 12/22/2020 0902   KETONESUR 40 (A) 06/11/2022 1542   PROTEINUR 100 (A) 06/27/2022 1542   UROBILINOGEN 0.2 07/04/2021 0936   NITRITE POSITIVE (A) 06/18/2022 1542   LEUKOCYTESUR NEGATIVE 06/18/2022 1542   Sepsis Labs: Invalid input(s): "PROCALCITONIN", "LACTICIDVEN"  Microbiology: Recent Results (from the past 240 hour(s))  Culture, blood (Routine X 2) w Reflex to ID Panel     Status: None   Collection Time: 06/23/2022  8:20 AM   Specimen: BLOOD  Result Value Ref Range Status   Specimen Description   Final    BLOOD BLOOD RIGHT HAND Performed at [facility], [place] [address], [place], [place] [address]    Special Requests   Final    BOTTLES DRAWN AEROBIC AND ANAEROBIC Blood Culture results may not be optimal due to an inadequate volume of blood received in culture bottles Performed at [place] [address] [place], [place] [address]    Culture   Final    NO GROWTH 5 DAYS Performed at [facility], [place] [address], [place], [place] [address]    Report Status 06/27/2022 FINAL  Final  Culture, blood (Routine X 2) w Reflex to ID Panel     Status: None   Collection Time: 06/11/2022  8:31 AM   Specimen: BLOOD  Result Value Ref Range Status   Specimen Description   Final    BLOOD BLOOD RIGHT ARM Performed at [place] [address]  [place], [place] [address]    Special Requests   Final    BOTTLES DRAWN AEROBIC AND ANAEROBIC Blood Culture results may not be optimal due to an inadequate volume of blood received in culture bottles Performed at [place] [address] [place], [place] [address]    Culture   Final    NO GROWTH 5 DAYS Performed at [facility], [place] [address], [place], [place] [address]    Report Status 06/27/2022 FINAL  Final  Surgical pcr screen     Status: None   Collection Time: 07/04/2022 10:52 AM   Specimen: Nasal Mucosa; Nasal Swab  Result Value Ref Range Status   MRSA, PCR NEGATIVE NEGATIVE Final   Staphylococcus aureus NEGATIVE NEGATIVE Final    Comment: (NOTE) The Xpert SA Assay (FDA approved for NASAL specimens in patients 71 years of age and older), is one component of a comprehensive surveillance program. It is not intended to diagnose infection nor to guide or monitor treatment. Performed at [facility], [place] [address] [place], [place] [address]   Body fluid culture w Gram Stain     Status: None (Preliminary result)   Collection Time: 06/30/22  3:25 PM   Specimen: PATH Cytology  Peritoneal fluid  Result Value Ref Range Status   Specimen Description   Final    PERITONEAL Performed at [place] [address], [place], [place] [address]    Special Requests   Final    NONE Performed at [facility], [place] [address] [place], [place] [address]    Gram Stain   Final    RARE WBC PRESENT, PREDOMINANTLY MONONUCLEAR NO ORGANISMS SEEN    Culture   Final    NO GROWTH < 24 HOURS Performed at [facility], [place] [address], [place], [place] [address]    Report Status PENDING  Incomplete  Anaerobic culture w Gram Stain     Status: None (Preliminary result)   Collection Time: 06/30/22  3:25 PM   Specimen: Peritoneal Washings  Result Value Ref Range Status   Specimen Description PERITONEAL  Final   Special Requests NONE  Final   Gram Stain   Final    RARE WBC PRESENT, PREDOMINANTLY MONONUCLEAR NO ORGANISMS SEEN Performed at [facility], [place] [address] [place], [place] [address]    Culture PENDING  Incomplete   Report Status PENDING  Incomplete    Radiology Studies: US Paracentesis  Result Date: 06/30/2022 INDICATION: Metastatic adenocarcinoma of unknown primary with ascites. Request for diagnostic and therapeutic paracentesis. EXAM: ULTRASOUND GUIDED PARACENTESIS MEDICATIONS: 1% lidocaine 10 mL COMPLICATIONS: None immediate. PROCEDURE: Informed written consent was obtained from the patient after a discussion of the risks, benefits and alternatives to treatment. A timeout was performed prior to the initiation of the procedure. Initial ultrasound scanning demonstrates a large amount of ascites within the right lateral abdomen. The right lateral abdomen was prepped and draped in the usual sterile fashion. 1% lidocaine was used for local anesthesia. Following this, a 19 gauge, 7-cm, Yueh catheter was introduced. An ultrasound image was saved for documentation purposes. The paracentesis was performed. The catheter was  removed and a dressing was applied. The patient tolerated the procedure well without immediate post procedural complication. FINDINGS: A total of approximately 4.2 L of amber fluid was removed. Samples were sent to the laboratory as requested by the clinical team. IMPRESSION: Successful ultrasound-guided paracentesis yielding 4.2 liters of peritoneal fluid. Procedure  performed by: Gareth Eagle, PA-C Electronically Signed   By: Lucrezia Europe M.D.   On: 06/30/2022 16:27      Averianna Brugger T. [place]  If 7PM-7AM, please contact night-coverage [email or web address] 07/01/2022, 1:43 PM

## 2022-07-01 NOTE — Progress Notes (Signed)
PHARMACY - TOTAL PARENTERAL NUTRITION CONSULT NOTE   Indication: SBO with Prolonged ileus  Patient Measurements: Height: '5\' 11"'$  (180.3 cm) Weight: 78.5 kg (173 lb 1 oz) IBW/kg (Calculated) : 75.3 TPN AdjBW (KG): 78 Body mass index is 24.14 kg/m. Usual Weight:    Assessment:  Post operative abdominal pain with ascites after umbilical hernia repair with mesh on 06/08/22.   On 11/16 he had Lap biopsy and ascites aspiration.  Now has ileus with SBO.  Glucose / Insulin: No h/o DM noted. A1C 4.8.  - CBGs <180 (117-160) - SSI used: 14 units last 24h Electrolytes: Na improved (none in TPN), Mag 2.3 (none in TPN), Phos WNL. (goal K>=4 and Mg>=2 with ileus.) CoCa 10.06 ok. No new labs 11/25 Renal: h/o BPH (holding Flomax) Scr <1, BUN wnl Hepatic: AST/ALT, Alkphos decreased to WNL. Tbili WNL. Triglycerides 189. Albumin 2.3 Intake / Output; MIVF:  Output:  NG 1150 mL/ 24 hrs (11/24 paracentesis 4.3L peritoneal fluid), UOP 2436m noted., LBM noted on flowsheet 11/19 (MD notes BM today 11/25 in note) PO: 0/24h (chips/sips) mIVF: change D5KCl @ 20 [place]  [place] CT: Mildly dilated loops of small bowel in the left lower quadrant with scattered air-fluid levels, nonspecific for ileus versus early obstruction 11/17: Xray: ileus vs obstruction 11/18: CT:  peritoneal carcinomatosis,  partial bowel obstruction/ileus  GI Surgeries / Procedures:  11/2 hernia repair 11/16 diagnostic laparoscopy  Central access: PICC 11/21 TPN start date: 11/21  Nutritional Goals: Goal TPN rate is  85 mL/hr to provide 108 g of protein and 2154 kcals per day)  RD Assessment: Estimated Needs Total Energy Estimated Needs: 2100-2300 Total Protein Estimated Needs: 95-110g Total Fluid Estimated Needs: 2.1L/day  Current Nutrition:  NPO and TPN sips,chips NG LIWS   Plan:  TPN at goal rate 862mhr at 1800. No changes 11/25 Electrolytes in TPN: Na 46m55mL, K 35 mEq/L, Ca 5mE47m, Mg 46mEq28m  and Phos 146mmo246m Cl:Ac 1:2 (Unable to keep 1:1 ratio with elyte doses) Add standard MVI and trace elements to TPN Continue Moderate q 4hr SSI and adjust as needed  Change IVF to 0.45% NaCl KVO at 1800 Monitor TPN labs on Mon/Thurs and PRN   Zeeva Courser S. RobertAlford HighlandmD, BCPS Clinical Staff Pharmacist [email or web address]  07/01/2022 9:39 AM

## 2022-07-01 NOTE — Progress Notes (Signed)
OT Cancellation Note  Patient Details Name: Cameron Rice MRN: [id number] DOB: 1950-08-11   Cancelled Treatment:    Reason Eval/Treat Not Completed: Fatigue/lethargy limiting ability to participate Patient reporting having vomited earlier and still fatigued from PT session in AM. Patient agreed to therapist coming back on 11/26.  Rennie Plowman, MS Acute Rehabilitation Department Office# [phone]  07/01/2022, 3:25 PM

## 2022-07-01 NOTE — Progress Notes (Signed)
NP Zebedee Iba was contacted to renew patient's cardiac monitoring that was expired. Patient is still tachy in the 120's-130's.

## 2022-07-01 NOTE — Progress Notes (Signed)
9 Days Post-Op   Subjective/Chief Complaint: Had a bm, some flatus, much of ng is po intake I think, had paracentesis yesterday   Objective: Vital signs in last 24 hours: Temp:  [97.8 F (36.6 C)-98.7 F (37.1 C)] 98.7 F (37.1 C) (11/25 0639) Pulse Rate:  [111-131] 129 (11/25 0823) Resp:  [13-20] 16 (11/25 0639) BP: (110-138)/(78-99) 121/90 (11/25 0823) SpO2:  [93 %-97 %] 97 % (11/25 0823) Weight:  [78.5 kg] 78.5 kg (11/25 0500) Last BM Date : 06/25/22  Intake/Output from previous day: 11/24 0701 - 11/25 0700 In: 3008.4 [I.V.:3008.4] Out: 3551 [Urine:2401; Emesis/NG output:1150] Intake/Output this shift: No intake/output data recorded.  Ab less distended, incisions clean nontender  Lab Results:  Recent Labs    06/30/22 0508 07/01/22 0410  WBC 43.4* 42.3*  HGB 12.1* 12.3*  HCT 38.6* 38.5*  PLT 588* 438*   BMET Recent Labs    06/29/22 0304 06/30/22 0508  NA 143 143  K 3.9 3.8  CL 107 108  CO2 28 29  GLUCOSE 149* 156*  BUN 23 33*  CREATININE 0.88 0.91  CALCIUM 8.6* 8.7*   PT/INR No results for input(s): "LABPROT", "INR" in the last 72 hours. ABG No results for input(s): "PHART", "HCO3" in the last 72 hours.  Invalid input(s): "PCO2", "PO2"  Studies/Results: US Paracentesis  Result Date: 06/30/2022 INDICATION: Metastatic adenocarcinoma of unknown primary with ascites. Request for diagnostic and therapeutic paracentesis. EXAM: ULTRASOUND GUIDED PARACENTESIS MEDICATIONS: 1% lidocaine 10 mL COMPLICATIONS: None immediate. PROCEDURE: Informed written consent was obtained from the patient after a discussion of the risks, benefits and alternatives to treatment. A timeout was performed prior to the initiation of the procedure. Initial ultrasound scanning demonstrates a large amount of ascites within the right lateral abdomen. The right lateral abdomen was prepped and draped in the usual sterile fashion. 1% lidocaine was used for local anesthesia. Following this, a  19 gauge, 7-cm, Yueh catheter was introduced. An ultrasound image was saved for documentation purposes. The paracentesis was performed. The catheter was removed and a dressing was applied. The patient tolerated the procedure well without immediate post procedural complication. FINDINGS: A total of approximately 4.2 L of amber fluid was removed. Samples were sent to the laboratory as requested by the clinical team. IMPRESSION: Successful ultrasound-guided paracentesis yielding 4.2 liters of peritoneal fluid. Procedure performed by: Gareth Eagle, PA-C Electronically Signed   By: Lucrezia Europe M.D.   On: 06/30/2022 16:27   DG Abd Portable 1V  Result Date: 06/29/2022 CLINICAL DATA:  [id number] Encounter for imaging study to confirm nasogastric (NG) tube placement [id number] EXAM: PORTABLE ABDOMEN - 1 VIEW COMPARISON:  Chest x-ray 06/29/2022, CT abdomen pelvis 06/28/2022 FINDINGS: Interval advancement of an enteric tube coursing below the hemidiaphragm with tip and side port overlying the expected region the gastric lumen. Nonobstructive bowel gas pattern of visualized upper abdomen. The heart and mediastinal contours are unchanged. Aortic calcification. Low lung volumes. Persistent retrocardiac opacity that likely represents linear atelectasis for scarring better evaluated on CT abdomen pelvis 06/28/2022. No pulmonary edema. No pleural effusion. No pneumothorax. No acute osseous abnormality. IMPRESSION: 1. Enteric tube in good position. 2. Low lung volumes. 3. Nonobstructive bowel gas pattern. Electronically Signed   By: Iven Finn M.D.   On: 06/29/2022 18:21   DG Abd Portable 1V  Result Date: 06/29/2022 CLINICAL DATA:  Nasogastric tube evaluation. EXAM: PORTABLE ABDOMEN - 1 VIEW COMPARISON:  None Available. FINDINGS: The bowel gas pattern is normal. Nasogastric tube with distal tip at  the GE junction it could be 8-10 cm. Low lung volumes with bibasilar atelectasis. IMPRESSION: Nasogastric tube with distal tip at the  GE junction, it could be advanced 8-10 cm. Electronically Signed   By: Keane Police D.O.   On: 06/29/2022 17:39   DG Abd Portable 1V  Result Date: 06/29/2022 CLINICAL DATA:  NG tube placement. EXAM: PORTABLE ABDOMEN - 1 VIEW COMPARISON:  06/26/2022 FINDINGS: Enteric/NG tube is noted with tip overlying the UPPER mediastinum. It is difficult to determine if this lies within the esophagus or trachea on this study. Recommend repositioning with clinical correlation. No other changes identified.  There is no evidence of pneumothorax. IMPRESSION: Enteric/NG tube with tip overlying the UPPER mediastinum, which may lie within the esophagus but recommend repositioning with clinical correlation. Electronically Signed   By: Margarette Canada M.D.   On: 06/29/2022 17:26    Anti-infectives: Anti-infectives (From admission, onward)    Start     Dose/Rate Route Frequency Ordered Stop   06/30/2022 0200  piperacillin-tazobactam (ZOSYN) IVPB 3.375 g  Status:  Discontinued       See Hyperspace for full Linked Orders Report.   3.375 g 12.5 mL/hr over 240 Minutes Intravenous Every 8 hours 06/11/2022 1849 06/27/22 1232   06/17/2022 1900  piperacillin-tazobactam (ZOSYN) IVPB 3.375 g       See Hyperspace for full Linked Orders Report.   3.375 g 100 mL/hr over 30 Minutes Intravenous  Once 06/30/2022 1849 06/16/2022 1940       Assessment/Plan: Post operative abdominal pain with ascites after umbilical hernia repair with mesh by Dr. Rosendo Gros 06/08/22  -POD# 9 s/p LAPAROSCOPY DIAGNOSTIC, BIOPSY OF OMENTAL AND PERITONEAL NODULES, ASPIRATION OF ASCITES FOR CYTOLOGY 11/16 Dr. Ninfa Linden - pathology and cytology resulted - moderately differentiated adenocarcinoma - CT C/A/P 11/18 showed chronic PE and right common iliac DVT - now on heparin gtt with no sign of bleeding CT showed evidence of peritoneal carcinomatosis but no obvious sign of primary tumor.  No pulmonary tumor or mets noted. Repeat ct scan shows no real change yesterday, has  ascites but no real reason for increased wbc (afebrile) likely inflammatory from another source. Does not look obstructed on imaging,   he had paracentesis yesterday- he wouldn't have sbp as he has had surgery and this will likely reaccumulate but may be helpful to reduce amount of pressure in his abdomen - will try to clamp ng today, continue tpn, sips/chips only - Mobilize   ID - wbc stable FEN -  NG clamped, TPN started 11/21  VTE - hep gtt Foley - d/c 11/17, voiding  HTN HLD BPH Hx UTI    Arav Bannister 07/01/2022

## 2022-07-01 NOTE — Progress Notes (Signed)
Pt has denied nausea since this afternoon. Resting quietly. Pt has been to BR several times to have BMs.

## 2022-07-01 NOTE — Progress Notes (Signed)
Nausea med given after pt vomited small amt and was up to BR almost immediately. Had BM in addition to several this am. Denied nausea after getting back to bed. NGT not hooked pt up to suction at this time. Pt and wife reassured.

## 2022-07-01 NOTE — Progress Notes (Signed)
ANTICOAGULATION CONSULT NOTE - Follow Up Consult  Pharmacy Consult for Heparin Indication:  DVT and PE  No Known Allergies  Patient Measurements: Height: '5\' 11"'$  (180.3 cm) Weight: 78 kg (171 lb 15.3 oz) IBW/kg (Calculated) : 75.3 Heparin Dosing Weight: 78 kg  Vital Signs: Temp: 98.3 F (36.8 C) (11/25 0133) Temp Source: Oral (11/25 0133) BP: 138/88 (11/25 0133) Pulse Rate: 122 (11/25 0133)  Labs: Recent Labs    06/29/22 0304 06/29/22 1519 06/29/22 2300 06/30/22 0508 07/01/22 0319 07/01/22 0410  HGB 13.3  --   --  12.1*  --  12.3*  HCT 42.1  --   --  38.6*  --  38.5*  PLT 626*  --   --  588*  --  438*  HEPARINUNFRC <0.10*   < > 0.55 0.54 0.26*  --   CREATININE 0.88  --   --  0.91  --   --    < > = values in this interval not displayed.     Estimated Creatinine Clearance: 81.6 mL/min (by C-G formula based on SCr of 0.91 mg/dL).   Assessment: AC/Heme: heparin drip for new PE on CT (and suspicious for right common iliac DVT ). Dopplers negative for DVT.  07/01/22 Heparin level = 0.26 (subtherapeutic) with heparin gtt @ 1650 units/hr Hgb = 12.3, Pltc 438K MAR shows heparin was stopped 06/30/22 @ 14:58.  Patient underwent US guided pracentesis around 16:00.  Heparin was restarted by RN @ 19:30.  Since heparin restarted, RN confirmed no interruption in therapy.  Goal of Therapy:  Heparin level 0.3-0.7 units/ml Monitor platelets by anticoagulation protocol: Yes   Plan:  Increase IV heparin gtt to 1750 units/hr Check heparin level 6 hr after rate increase Daily HL and CBC   Karleen Seebeck, Toribio Harbour, PharmD 07/01/2022,5:07 AM

## 2022-07-02 ENCOUNTER — Inpatient Hospital Stay (HOSPITAL_COMMUNITY): Payer: BC Managed Care – PPO

## 2022-07-02 ENCOUNTER — Encounter (HOSPITAL_COMMUNITY): Payer: Self-pay | Admitting: Family Medicine

## 2022-07-02 DIAGNOSIS — N401 Enlarged prostate with lower urinary tract symptoms: Secondary | ICD-10-CM | POA: Diagnosis not present

## 2022-07-02 DIAGNOSIS — K9131 Postprocedural partial intestinal obstruction: Secondary | ICD-10-CM | POA: Diagnosis not present

## 2022-07-02 DIAGNOSIS — E440 Moderate protein-calorie malnutrition: Secondary | ICD-10-CM | POA: Diagnosis not present

## 2022-07-02 DIAGNOSIS — Z515 Encounter for palliative care: Secondary | ICD-10-CM | POA: Diagnosis not present

## 2022-07-02 DIAGNOSIS — I10 Essential (primary) hypertension: Secondary | ICD-10-CM | POA: Diagnosis not present

## 2022-07-02 DIAGNOSIS — A419 Sepsis, unspecified organism: Secondary | ICD-10-CM | POA: Diagnosis not present

## 2022-07-02 DIAGNOSIS — K56609 Unspecified intestinal obstruction, unspecified as to partial versus complete obstruction: Secondary | ICD-10-CM | POA: Diagnosis not present

## 2022-07-02 DIAGNOSIS — Z66 Do not resuscitate: Secondary | ICD-10-CM | POA: Diagnosis not present

## 2022-07-02 LAB — COMPREHENSIVE METABOLIC PANEL
ALT: 48 U/L — ABNORMAL HIGH (ref 0–44)
ALT: 43 U/L (ref 0–44)
AST: 51 U/L — ABNORMAL HIGH (ref 15–41)
AST: 40 U/L (ref 15–41)
Albumin: 1.8 g/dL — ABNORMAL LOW (ref 3.5–5.0)
Albumin: 1.8 g/dL — ABNORMAL LOW (ref 3.5–5.0)
Alkaline Phosphatase: 139 U/L — ABNORMAL HIGH (ref 38–126)
Alkaline Phosphatase: 125 U/L (ref 38–126)
Anion gap: 10 (ref 5–15)
Anion gap: 10 (ref 5–15)
BUN: 34 mg/dL — ABNORMAL HIGH (ref 8–23)
BUN: 52 mg/dL — ABNORMAL HIGH (ref 8–23)
CO2: 25 mmol/L (ref 22–32)
CO2: 25 mmol/L (ref 22–32)
Calcium: 8.2 mg/dL — ABNORMAL LOW (ref 8.9–10.3)
Calcium: 8 mg/dL — ABNORMAL LOW (ref 8.9–10.3)
Chloride: 107 mmol/L (ref 98–111)
Chloride: 105 mmol/L (ref 98–111)
Creatinine, Ser: 0.8 mg/dL (ref 0.61–1.24)
Creatinine, Ser: 1.86 mg/dL — ABNORMAL HIGH (ref 0.61–1.24)
GFR, Estimated: >60 mL/min (ref >60)
GFR, Estimated: 39 mL/min — ABNORMAL LOW (ref >60)
Glucose, Bld: 131 mg/dL — ABNORMAL HIGH (ref 70–99)
Glucose, Bld: 165 mg/dL — ABNORMAL HIGH (ref 70–99)
Potassium: 3.7 mmol/L (ref 3.5–5.1)
Potassium: 3.9 mmol/L (ref 3.5–5.1)
Sodium: 142 mmol/L (ref 135–145)
Sodium: 140 mmol/L (ref 135–145)
Total Bilirubin: 1.7 mg/dL — ABNORMAL HIGH (ref 0.3–1.2)
Total Bilirubin: 1.6 mg/dL — ABNORMAL HIGH (ref 0.3–1.2)
Total Protein: 5.9 g/dL — ABNORMAL LOW (ref 6.5–8.1)
Total Protein: 5.5 g/dL — ABNORMAL LOW (ref 6.5–8.1)

## 2022-07-02 LAB — CBC WITH DIFFERENTIAL/PLATELET
Abs Immature Granulocytes: 0.8 10*3/uL — ABNORMAL HIGH (ref 0.00–0.07)
Abs Immature Granulocytes: 0 10*3/uL (ref 0.00–0.07)
Basophils Absolute: 0 10*3/uL (ref 0.0–0.1)
Basophils Absolute: 0 10*3/uL (ref 0.0–0.1)
Basophils Relative: 0 %
Basophils Relative: 0 %
Eosinophils Absolute: 0 10*3/uL (ref 0.0–0.5)
Eosinophils Absolute: 0 10*3/uL (ref 0.0–0.5)
Eosinophils Relative: 0 %
Eosinophils Relative: 0 %
HCT: 38.2 % — ABNORMAL LOW (ref 39.0–52.0)
HCT: 35.8 % — ABNORMAL LOW (ref 39.0–52.0)
Hemoglobin: 11.9 g/dL — ABNORMAL LOW (ref 13.0–17.0)
Hemoglobin: 11.5 g/dL — ABNORMAL LOW (ref 13.0–17.0)
Lymphocytes Relative: 3 %
Lymphocytes Relative: 8 %
Lymphs Abs: 1.2 10*3/uL (ref 0.7–4.0)
Lymphs Abs: 3.1 10*3/uL (ref 0.7–4.0)
MCH: 29.8 pg (ref 26.0–34.0)
MCH: 30.3 pg (ref 26.0–34.0)
MCHC: 31.2 g/dL (ref 30.0–36.0)
MCHC: 32.1 g/dL (ref 30.0–36.0)
MCV: 95.5 fL (ref 80.0–100.0)
MCV: 94.2 fL (ref 80.0–100.0)
Monocytes Absolute: 2.1 10*3/uL — ABNORMAL HIGH (ref 0.1–1.0)
Monocytes Absolute: 0.8 10*3/uL (ref 0.1–1.0)
Monocytes Relative: 5 %
Monocytes Relative: 2 %
Myelocytes: 2 %
Neutro Abs: 37.2 10*3/uL — ABNORMAL HIGH (ref 1.7–7.7)
Neutro Abs: 34.7 10*3/uL — ABNORMAL HIGH (ref 1.7–7.7)
Neutrophils Relative %: 90 %
Neutrophils Relative %: 90 %
Platelets: 384 10*3/uL (ref 150–400)
Platelets: 352 10*3/uL (ref 150–400)
RBC: 4 MIL/uL — ABNORMAL LOW (ref 4.22–5.81)
RBC: 3.8 MIL/uL — ABNORMAL LOW (ref 4.22–5.81)
RDW: 14.7 % (ref 11.5–15.5)
RDW: 15 % (ref 11.5–15.5)
WBC: 41.3 10*3/uL — ABNORMAL HIGH (ref 4.0–10.5)
WBC: 38.5 10*3/uL — ABNORMAL HIGH (ref 4.0–10.5)
nRBC: 2.6 % — ABNORMAL HIGH (ref 0.0–0.2)
nRBC: 7.4 % — ABNORMAL HIGH (ref 0.0–0.2)

## 2022-07-02 LAB — GLUCOSE, CAPILLARY
Glucose-Capillary: 136 mg/dL — ABNORMAL HIGH (ref 70–99)
Glucose-Capillary: 147 mg/dL — ABNORMAL HIGH (ref 70–99)
Glucose-Capillary: 149 mg/dL — ABNORMAL HIGH (ref 70–99)
Glucose-Capillary: 156 mg/dL — ABNORMAL HIGH (ref 70–99)
Glucose-Capillary: 165 mg/dL — ABNORMAL HIGH (ref 70–99)
Glucose-Capillary: 167 mg/dL — ABNORMAL HIGH (ref 70–99)

## 2022-07-02 LAB — VITAMIN B12: Vitamin B-12: 3293 pg/mL — ABNORMAL HIGH (ref 180–914)

## 2022-07-02 LAB — HEPARIN LEVEL (UNFRACTIONATED)
Heparin Unfractionated: 0.14 IU/mL — ABNORMAL LOW (ref 0.30–0.70)
Heparin Unfractionated: 0.34 IU/mL (ref 0.30–0.70)
Heparin Unfractionated: 0.53 IU/mL (ref 0.30–0.70)

## 2022-07-02 LAB — BLOOD GAS, VENOUS
Acid-Base Excess: 6.7 mmol/L — ABNORMAL HIGH (ref 0.0–2.0)
Bicarbonate: 30.4 mmol/L — ABNORMAL HIGH (ref 20.0–28.0)
O2 Saturation: 40 %
Patient temperature: 37
pCO2, Ven: 39 mmHg — ABNORMAL LOW (ref 44–60)
pH, Ven: 7.5 — ABNORMAL HIGH (ref 7.25–7.43)
pO2, Ven: 31 mmHg — CL (ref 32–45)

## 2022-07-02 LAB — LACTIC ACID, PLASMA
Lactic Acid, Venous: 2.6 mmol/L (ref 0.5–1.9)
Lactic Acid, Venous: 2.5 mmol/L (ref 0.5–1.9)

## 2022-07-02 LAB — PHOSPHORUS: Phosphorus: 3.6 mg/dL (ref 2.5–4.6)

## 2022-07-02 LAB — TSH: TSH: 3.674 u[IU]/mL (ref 0.350–4.500)

## 2022-07-02 LAB — MAGNESIUM: Magnesium: 1.9 mg/dL (ref 1.7–2.4)

## 2022-07-02 LAB — TROPONIN I (HIGH SENSITIVITY)
Troponin I (High Sensitivity): 37 ng/L — ABNORMAL HIGH (ref <18)
Troponin I (High Sensitivity): 43 ng/L — ABNORMAL HIGH (ref <18)

## 2022-07-02 LAB — AMMONIA: Ammonia: 36 umol/L — ABNORMAL HIGH (ref 9–35)

## 2022-07-02 MED ORDER — LACTATED RINGERS IV BOLUS
1000.0000 mL | Freq: Once | INTRAVENOUS | Status: AC
  Administered 2022-07-02: 1000 mL via INTRAVENOUS

## 2022-07-02 MED ORDER — IOHEXOL 350 MG/ML SOLN
75.0000 mL | Freq: Once | INTRAVENOUS | Status: AC | PRN
  Administered 2022-07-02: 75 mL via INTRAVENOUS

## 2022-07-02 MED ORDER — TRAVASOL 10 % IV SOLN
INTRAVENOUS | Status: AC
  Filled 2022-07-02: qty 1081.2

## 2022-07-02 MED ORDER — METOPROLOL TARTRATE 25 MG PO TABS
25.0000 mg | ORAL_TABLET | Freq: Two times a day (BID) | ORAL | Status: DC
  Filled 2022-07-02: qty 1

## 2022-07-02 MED ORDER — METOPROLOL TARTRATE 25 MG PO TABS
25.0000 mg | ORAL_TABLET | Freq: Two times a day (BID) | ORAL | Status: DC
  Administered 2022-07-02: 25 mg via ORAL
  Filled 2022-07-02 (×2): qty 1

## 2022-07-02 MED ORDER — HYDROMORPHONE HCL 1 MG/ML IJ SOLN
0.5000 mg | INTRAMUSCULAR | Status: DC | PRN
  Administered 2022-07-03 – 2022-07-04 (×3): 0.5 mg via INTRAVENOUS
  Filled 2022-07-02 (×3): qty 1

## 2022-07-02 MED ORDER — ACETAMINOPHEN 325 MG PO TABS: 650.0000 mg | ORAL_TABLET | Freq: Four times a day (QID) | ORAL | Status: DC | PRN

## 2022-07-02 MED ORDER — METOPROLOL TARTRATE 50 MG PO TABS: 50.0000 mg | ORAL_TABLET | Freq: Two times a day (BID) | ORAL | Status: DC

## 2022-07-02 MED ORDER — SODIUM CHLORIDE 0.9 % IV SOLN
12.5000 mg | Freq: Four times a day (QID) | INTRAVENOUS | Status: DC | PRN
  Administered 2022-07-02: 12.5 mg via INTRAVENOUS
  Filled 2022-07-02: qty 0.5

## 2022-07-02 MED ORDER — PROCHLORPERAZINE EDISYLATE 10 MG/2ML IJ SOLN
5.0000 mg | Freq: Four times a day (QID) | INTRAMUSCULAR | Status: DC | PRN
  Administered 2022-07-09: 5 mg via INTRAVENOUS
  Filled 2022-07-02: qty 2

## 2022-07-02 MED ORDER — OXYCODONE HCL 5 MG PO TABS
5.0000 mg | ORAL_TABLET | Freq: Three times a day (TID) | ORAL | Status: DC | PRN
  Filled 2022-07-02: qty 1

## 2022-07-02 NOTE — Progress Notes (Signed)
Pt consistently running high HR @ rest. Dr Cyndia Skeeters aware. See new orders.

## 2022-07-02 NOTE — Evaluation (Signed)
Occupational Therapy Evaluation Patient Details Name: Cameron Rice MRN: [id number] DOB: 07-01-1951 Today's Date: 07/02/2022   History of Present Illness Patient is a 71 year old male who presented to the hospital on 11/15 with nausea and vomiting for 2 days with abdominal distention. On 11/16, Patient underwent a laproscopy with biopsy. Patient was found to have metastatic adenocarcinoma. Post op patient was noted to develop ileus requiring NG tube and TPN. Patient underwent paracentesis on 11/24 with 4.2L retrieved.   PMH: BPH, umbilical hernia repair 14/7.   Clinical Impression   Patient is a 71 year old male who was admitted for above. Patient was living at home with wife with independence in ADLs prior level. Currently, patient is max A for LB dressing with inability to complete figure four positioning while seated. Patient was min guard with RW with cues for postural positioning while functional mobility in room and hallway with HR noted to increase to 142 bpm with medications onboard. Patient would benefit from continued skilled OT services for balance training and education on AE to increase independence in next level of care.       Recommendations for follow up therapy are one component of a multi-disciplinary discharge planning process, led by the attending physician.  Recommendations may be updated based on patient status, additional functional criteria and insurance authorization.   Follow Up Recommendations  No OT follow up     Assistance Recommended at Discharge Frequent or constant Supervision/Assistance  Patient can return home with the following A little help with walking and/or transfers;Assistance with cooking/housework;Direct supervision/assist for medications management;Assist for transportation;Help with stairs or ramp for entrance;Direct supervision/assist for financial management;A little help with bathing/dressing/bathroom    Functional Status Assessment  Patient  has had a recent decline in their functional status and demonstrates the ability to make significant improvements in function in a reasonable and predictable amount of time.  Equipment Recommendations  Other (comment) (total hip kit)    Recommendations for Other Services       Precautions / Restrictions Precautions Precaution Comments: TPN, monitor HR Restrictions Weight Bearing Restrictions: No      Mobility Bed Mobility Overal bed mobility: Needs Assistance Bed Mobility: Sit to Supine       Sit to supine: Min assist, HOB elevated   General bed mobility comments: with increased time line management    Transfers                          Balance                                           ADL either performed or assessed with clinical judgement   ADL Overall ADL's : Needs assistance/impaired Eating/Feeding: Set up;Sitting Eating/Feeding Details (indicate cue type and reason): thin liquids only at this time Grooming: Set up;Sitting   Upper Body Bathing: Min guard;Sitting   Lower Body Bathing: Maximal assistance;Sit to/from stand;Sitting/lateral leans   Upper Body Dressing : Min guard;Sitting   Lower Body Dressing: Maximal assistance;Sitting/lateral leans;Sit to/from stand   Toilet Transfer: Minimal assistance;Ambulation;Rolling walker (2 wheels) Toilet Transfer Details (indicate cue type and reason): with education to keep RW close to him and stand up tall. patient noted to increase hunched posture with fatigue and hold walker further out in front with fatigue as well. HR noted to get to 142 bpm  with funtional mobility in hallway with nurse having onboarded all cardiac meds at this time. Toileting- Clothing Manipulation and Hygiene: Minimal assistance;Sit to/from stand       Functional mobility during ADLs: Minimal assistance;Rolling walker (2 wheels)       Vision Patient Visual Report: No change from baseline       Perception      Praxis      Pertinent Vitals/Pain Pain Assessment Pain Assessment: Faces Faces Pain Scale: Hurts little more Pain Location: belly with movement Pain Descriptors / Indicators: Sore, Pressure Pain Intervention(s): Limited activity within patient's tolerance, Monitored during session     Hand Dominance     Extremity/Trunk Assessment Upper Extremity Assessment Upper Extremity Assessment: Overall WFL for tasks assessed   Lower Extremity Assessment Lower Extremity Assessment: Defer to PT evaluation       Communication Communication Communication: No difficulties   Cognition Arousal/Alertness: Awake/alert Behavior During Therapy: WFL for tasks assessed/performed Overall Cognitive Status: Within Functional Limits for tasks assessed                                 General Comments: plesant and cooperative. wife present during session as well.     General Comments       Exercises     Shoulder Instructions      Home Living Family/patient expects to be discharged to:: Private residence Living Arrangements: Spouse/significant other Available Help at Discharge: Family Type of Home: House Home Access: Stairs to enter Technical brewer of Steps: 3 Entrance Stairs-Rails: Right Home Layout: One level               Home Equipment: None   Additional Comments: patient reported having a few reachers at home from sciatica      Prior Functioning/Environment Prior Level of Function : Independent/Modified Independent                        OT Problem List: Decreased activity tolerance;Decreased knowledge of use of DME or AE;Decreased knowledge of precautions;Pain      OT Treatment/Interventions: Self-care/ADL training;Energy conservation;Balance training;Therapeutic exercise;DME and/or AE instruction;Neuromuscular education;Therapeutic activities;Patient/family education    OT Goals(Current goals can be found in the care plan section) Acute  Rehab OT Goals Patient Stated Goal: to get home OT Goal Formulation: With patient/family Time For Goal Achievement: 07/16/22 Potential to Achieve Goals: Fair  OT Frequency: Min 2X/week    Co-evaluation              AM-PAC OT "6 Clicks" Daily Activity     Outcome Measure Help from another person eating meals?: A Little Help from another person taking care of personal grooming?: A Little Help from another person toileting, which includes using toliet, bedpan, or urinal?: A Little Help from another person bathing (including washing, rinsing, drying)?: A Lot Help from another person to put on and taking off regular upper body clothing?: A Little Help from another person to put on and taking off regular lower body clothing?: A Lot 6 Click Score: 16   End of Session Equipment Utilized During Treatment: Gait belt;Rolling walker (2 wheels) Nurse Communication: Mobility status  Activity Tolerance: Patient tolerated treatment well Patient left: in bed;with call bell/phone within reach;with family/visitor present  OT Visit Diagnosis: Unsteadiness on feet (R26.81);Other abnormalities of gait and mobility (R26.89)                Time:  0920-0942 OT Time Calculation (min): 22 min Charges:  OT General Charges $OT Visit: 1 Visit OT Evaluation $OT Eval Moderate Complexity: 1 Mod  Keena Dinse OTR/L, MS Acute Rehabilitation Department Office# [phone]   Willa Rough 07/02/2022, 1:40 PM

## 2022-07-02 NOTE — Progress Notes (Addendum)
Orlie Dakin RN with Rapid Response at bedside. Dr. Cyndia Skeeters has put in orders to transfer pt to progressive. 12- lead EKG being obtained. Reassurance given to pt and wife.

## 2022-07-02 NOTE — Progress Notes (Signed)
PHARMACY - TOTAL PARENTERAL NUTRITION CONSULT NOTE   Indication: SBO with Prolonged ileus  Patient Measurements: Height: '5\' 11"'$  (180.3 cm) Weight: 80.8 kg (178 lb 2.1 oz) IBW/kg (Calculated) : 75.3 TPN AdjBW (KG): 78 Body mass index is 24.84 kg/m. Usual Weight:    Assessment:  Post operative abdominal pain with ascites after umbilical hernia repair with mesh on 06/08/22.   On 11/16 he had Lap biopsy and ascites aspiration.  Now has ileus with SBO.  Glucose / Insulin: No h/o DM noted. A1C 4.8.  - CBGs <180 (127-156) - SSI used: 15 units last 24h Electrolytes: Na improved (none in TPN), K 3.7, Mag 2.3 (none in TPN), Phos WNL. (goal K>=4 and Mg>=2 with ileus.) CoCa 9.96 ok.  Renal: h/o BPH (holding Flomax) Scr <1, BUN wnl Hepatic: AST/ALT up slightly today, Alkphos again elevated. Tbili 1.7 up. Triglycerides 189. Albumin 1.8 Intake / Output; MIVF:  Output:  NG 0 mL/ 24 hrs documented(11/24 paracentesis 4.3L peritoneal fluid), UOP not documented., LBM 11/25 PO: 0/24h (chips/sips) mIVF: change D5KCl @ 20 to 0.45%NS  at [place]: 11/15: CT: Mildly dilated loops of small bowel in the left lower quadrant with scattered air-fluid levels, nonspecific for ileus versus early obstruction 11/17: Xray: ileus vs obstruction 11/18: CT:  peritoneal carcinomatosis,  partial bowel obstruction/ileus  GI Surgeries / Procedures:  11/2 hernia repair 11/16 diagnostic laparoscopy  Central access: PICC 11/21 TPN start date: 11/21  Nutritional Goals: Goal TPN rate is  85 mL/hr to provide 108 g of protein and 2154 kcals per day)  RD Assessment: Estimated Needs Total Energy Estimated Needs: 2100-2300 Total Protein Estimated Needs: 95-110g Total Fluid Estimated Needs: 2.1L/day  Current Nutrition:  NPO and TPN sips,chips NG LIWS   Plan:  TPN at goal rate 98m/hr at 1800.  Electrolytes in TPN: Na 01m/L, incr K 45 mEq/L, Ca 62m76mL, incr Mg 2mE66m, and Phos 10mm362m. Cl:Ac 1:2 (Unable to  keep 1:1 ratio with elyte doses) Add standard MVI and trace elements to TPN Continue Moderate q 4hr SSI and adjust as needed  Change IVF to 0.45% NaCl KVO at 1800 Monitor TPN labs on Mon/Thurs and PRN   Jaklyn Alen S. RoberAlford HighlandrmD, BCPS Clinical Staff Pharmacist [email or web address]  07/02/2022 8:17 AM

## 2022-07-02 NOTE — Progress Notes (Signed)
Tachycardia to 140s and 150s this evening despite metoprolol.  Was given IV LR bolus 1 L x 1 without improvement in tachycardia.  Blood pressure stable.  Not febrile.  Tachycardia could be due to underlying PE and malignancy.  Peritoneal fluid culture was negative.   Vitals:   07/02/22 1037 07/02/22 1335 07/02/22 1814 07/02/22 1836  BP: 102/89 109/88 (!) 90/57 111/63  Pulse: (!) 135 (!) 148 (!) 145 (!) 150  Resp: '16 16 16 '$ (!) 24  Temp: 98.5 F (36.9 C) 98 F (36.7 C) 97.9 F (36.6 C)   TempSrc:   Oral   SpO2: 94% 94% 98% 94%  Weight:      Height:       Assessment and plan Persistent sinus tachycardia-likely due to underlying PE and malignancy.  Low suspicion for infection but cannot rule out completely.  SBP excluded by negative peritoneal fluid culture.  He was not tender either.  He is not febrile. -Most patient to progressive care -Continue p.o. metoprolol 25 mg twice daily with as needed IV metoprolol -Obtain portable CXR, CMP, CBC, VBG, lactic acid, blood culture, ammonia and B12 -CT chest with PE protocol to assess the extent of PE.  He is already on IV heparin. -Hold off further IV fluids. -Further plan based on results from the above

## 2022-07-02 NOTE — Progress Notes (Signed)
ANTICOAGULATION CONSULT NOTE - Follow Up Consult  Pharmacy Consult for Heparin Indication: pulmonary embolus  No Known Allergies  Patient Measurements: Height: '5\' 11"'$  (180.3 cm) Weight: 78.5 kg (173 lb 1 oz) IBW/kg (Calculated) : 75.3 Heparin Dosing Weight:  78.5 kg  Vital Signs: Temp: 98.3 F (36.8 C) (11/26 0415) BP: 110/76 (11/26 0415) Pulse Rate: 123 (11/26 0415)  Labs: Recent Labs    06/30/22 0508 07/01/22 0319 07/01/22 0410 07/01/22 1220 07/02/22 0302  HGB 12.1*  --  12.3*  --  11.9*  HCT 38.6*  --  38.5*  --  38.2*  PLT 588*  --  438*  --  384  HEPARINUNFRC 0.54 0.26*  --  0.35 0.14*  CREATININE 0.91  --   --   --   --      Estimated Creatinine Clearance: 81.6 mL/min (by C-G formula based on SCr of 0.91 mg/dL).  Assessment: AC/Heme: heparin drip for new PE on CT (and suspicious for right common iliac DVT ). Dopplers negative for DVT.  07/02/22 Heparin level = 0.14 (subtherapeutic) with heparin gtt @ 1750 units/hr.  Heparin level previously therapeutic at same rate.  Spoke with RN who confirmed no interruption in therapy and no line issues No complications of therapy noted Hgb 11.9, Pltc wnl  Goal of Therapy:  Heparin level 0.3-0.7 units/ml Monitor platelets by anticoagulation protocol: Yes   Plan:  Increase IV heparin to 1900 units/hr Check heparin level 6 hr after rate increase Daily HL and CBC  Trig Mcbryar, Toribio Harbour, PharmD 07/02/2022,5:36 AM

## 2022-07-02 NOTE — Progress Notes (Signed)
   07/02/22 1836  Vitals  BP 111/63  MAP (mmHg) 77  BP Location Left Leg  BP Method Automatic  Patient Position (if appropriate) Lying  Pulse Rate (!) 150  Pulse Rate Source Monitor  Resp (!) 24  MEWS COLOR  MEWS Score Color Red  Oxygen Therapy  SpO2 94 %  O2 Device Nasal Cannula  O2 Flow Rate (L/min) 2 L/min  MEWS Score  MEWS Temp 0  MEWS Systolic 0  MEWS Pulse 3  MEWS RR 1  MEWS LOC 0  MEWS Score 4  Rapid Response Notification  Name of Rapid Response RN Notified Zoe O,.,RN  Date Rapid Response Notified 07/02/22  Time Rapid Response Notified [id number]

## 2022-07-02 NOTE — Progress Notes (Signed)
   07/02/22 1836  Assess: MEWS Score  BP 111/63  MAP (mmHg) 77  Pulse Rate (!) 150  Resp (!) 24  SpO2 94 %  O2 Device Nasal Cannula  O2 Flow Rate (L/min) 2 L/min  Assess: MEWS Score  MEWS Temp 0  MEWS Systolic 0  MEWS Pulse 3  MEWS RR 1  MEWS LOC 0  MEWS Score 4  MEWS Score Color Red  Assess: if the MEWS score is Yellow or Red  Were vital signs taken at a resting state? Yes  Focused Assessment Change from prior assessment (see assessment flowsheet)  Does the patient meet 2 or more of the SIRS criteria? No  MEWS guidelines implemented *See Row Information* Yes  Notify: Rapid Response  Name of Rapid Response RN Notified Zoe O,.,RN  Date Rapid Response Notified 07/02/22  Time Rapid Response Notified [id number]  Document  Patient Outcome Transferred/level of care increased (when bed available)  Progress note created (see row info) Yes  Assess: SIRS CRITERIA  SIRS Temperature  0  SIRS Pulse 1  SIRS Respirations  1  SIRS WBC 1  SIRS Score Sum  3

## 2022-07-02 NOTE — Progress Notes (Signed)
   07/02/22 1814  Charting Type  Focused Reassessment Changes Noted Respiratory;Neurological;Gastrointestinal  [place] Work Intensity Score (Update with each assessment and as needed)  Work Intensity Score (Level) 3  Level 2 Intensity B.High fall risk patient that is oriented and follows commands  Level 3 Intensity B.Frequent assistance requests (patient or family)  Neurological  Orientation Level Oriented X4 (conversation confused at times)  Cognition Follows commands;Appropriate for developmental age;Memory impairment  Speech Clear  Respiratory  Respiratory (WDL) X  Respiratory Pattern Regular;Unlabored;Symmetrical  Chest Assessment Chest expansion symmetrical  Bilateral Breath Sounds Clear;Diminished;Coarse crackles  R Upper  Breath Sounds Clear;Coarse crackles  R Lower Breath Sounds Clear;Diminished;Coarse crackles  L Upper Breath Sounds Clear;Coarse crackles  L Lower Breath Sounds Clear;Diminished;Coarse crackles  Cough and Deep Breathe  Cough and Deep Breathe Yes  Cardiac  Cardiac (WDL) X  Pulse Regular (tachy)  ECG Monitoring  Ectopy Ventricular tachycardia, non-sustained (20+bt VT)  CV Strip Heart Rate 174  Gastrointestinal  Gastrointestinal (WDL) X  Abdomen Inspection Distended;Taut (Firm)  Bowel Sounds Assessment Hypoactive  Tenderness Tender  Neurological  Level of Consciousness Alert (in and out of lethargy)

## 2022-07-02 NOTE — Progress Notes (Signed)
ANTICOAGULATION CONSULT NOTE - Follow Up Consult  Pharmacy Consult for Heparin Indication: pulmonary embolus  No Known Allergies  Patient Measurements: Height: '5\' 11"'$  (180.3 cm) Weight: 80.8 kg (178 lb 2.1 oz) IBW/kg (Calculated) : 75.3 Heparin Dosing Weight:  80.8 kg  Vital Signs: Temp: 98 F (36.7 C) (11/26 1335) BP: 109/88 (11/26 1335) Pulse Rate: 148 (11/26 1335)  Labs: Recent Labs    06/30/22 0508 07/01/22 0319 07/01/22 0410 07/01/22 1220 07/02/22 0302 07/02/22 1330  HGB 12.1*  --  12.3*  --  11.9*  --   HCT 38.6*  --  38.5*  --  38.2*  --   PLT 588*  --  438*  --  384  --   HEPARINUNFRC 0.54   < >  --  0.35 0.14* 0.34  CREATININE 0.91  --   --   --  0.80  --    < > = values in this interval not displayed.    Estimated Creatinine Clearance: 92.8 mL/min (by C-G formula based on SCr of 0.8 mg/dL).   Assessment: AC/Heme: heparin drip for new PE on CT (and suspicious for right common iliac DVT ). Dopplers negative for DVT. - Hep level 0.14>0.34 now in goal range. Hgb 11.9 down slightly. Plts 384 declining.  Goal of Therapy:  Heparin level 0.3-0.7 units/ml Monitor platelets by anticoagulation protocol: Yes   Plan:  Con't IV heparin 1900 units/hr Confirm hep level in 6 hrs. Daily HL and CBC   Herold Salguero S. Alford Highland, PharmD, BCPS Clinical Staff Pharmacist [email or web address] Alford Highland, [employer] 07/02/2022,2:29 PM

## 2022-07-02 NOTE — Progress Notes (Signed)
Night shift CN rec'd report from assigned night shift CN Richelle for patient to be transferred to PCU room 1427. Handoff completed. No questions at this time.  Awaiting for patient to arrived to receiving floor.

## 2022-07-02 NOTE — Progress Notes (Signed)
PROGRESS NOTE  Cameron Rice MHD:[id number] DOB: [birth year]   PCP: Flossie Buffy, NP  Patient is from: Home  DOA: 06/15/2022 LOS: 69  Chief complaints Chief Complaint  Patient presents with   Abdominal Pain   Emesis     Brief Narrative / Interim history: 71 year old M with PMH of HTN, BPH, HLD, possible G6PD deficiency and recent umbilical hernia repair on 11/2 presenting with abdominal distention since his hernia repair and some nausea and vomiting for 2 days.  CT abdomen and pelvis concerning for metastatic disease with small bowel obstruction.  General surgery consulted.  He underwent diagnostic laparoscopy with biopsy on 11/16, and found to have peritoneal carcinomatosis.  Pathology confirmed metastatic adenocarcinoma of unknown primary.  CEA and CA 19-9 within normal.  Hematology/oncology consulted.  Patient had CT chest/abdomen on 06/14/2022 that showed PE within proximal superior lingual artery eccentric suggesting degree of chronicity and possible right common iliac vein DVT.  He was started on IV heparin for anticoagulation.  Hospital course complicated by postop ileus requiring NG tube decompression and TPN for feeding.  Also rising leukocytosis with left shift felt to be inflammatory response versus infection.  Underwent paracentesis with removal of 4.2 L on 06/30/2022.  Peritoneal fluid culture pending.  Subjective: Seen and examined earlier this morning.  No major events overnight of this morning.  Feels better today other than some hiccups.  NG tube discontinued.  Started on clear liquid diet.  Had multiple bowel movements yesterday  Objective: Vitals:   07/02/22 0415 07/02/22 0700 07/02/22 1037 07/02/22 1335  BP: 110/76  102/89 109/88  Pulse: (!) 123  (!) 135 (!) 148  Resp: '18  16 16  '$ Temp: 98.3 F (36.8 C)  98.5 F (36.9 C) 98 F (36.7 C)  TempSrc:      SpO2: 95%  94% 94%  Weight:  80.8 kg    Height:        Examination:   GENERAL: Appears frail  and chronically ill. HEENT: MMM.  Vision and hearing grossly intact.  NECK: Supple.  No apparent JVD.  RESP:  No IWOB.  Fair aeration bilaterally. CVS:  RRR. Heart sounds normal.  ABD/GI/GU: BS+. Abd slightly distended but soft.  Nontender. MSK/EXT:  Moves extremities. No apparent deformity. No edema.  SKIN: no apparent skin lesion or wound NEURO: Awake and alert. Oriented appropriately.  No apparent focal neuro deficit. PSYCH: Calm. Normal affect.   Procedures:  11/2-umbilical hernia repair with mesh by Dr. Rosendo Gros 11/16-diagnostic laparoscopy with biopsy by Dr. Ninfa Linden 11/24-ultrasound-guided paracentesis with removal of 4.2 L  Microbiology summarized: 11/16-MRSA PCR nonreactive 11/16-blood cultures NGTD 11/24-peritoneal fluid culture pending  Assessment and plan: Principal Problem:   SBO (small bowel obstruction) ([place]) Active Problems:   Essential hypertension   Complicated UTI (urinary tract infection)   BPH (benign prostatic hyperplasia)   Pure hypercholesterolemia   LFT elevation   Malnutrition of moderate degree   Peritoneal carcinomatosis ([place])   Metastatic adenocarcinoma ([place])   Postoperative ileus ([place])  SBO due to peritoneal carcinomatosis of metastatic adenocarcinoma of unknown primary Malignant ascites-s/p US guided paracentesis with removal of 4.2 L.  Cell to degenerated for identification Postop ileus-improving.  Having bowel movements.  NG tube removed.  Started on clear liquid diet. -S/p diagnostic laparoscopy by Dr. Ninfa Linden on 11/16 -CEA and CA 19-9 within normal. -Ultimate goal is follow-up with oncology team at [facility] on discharge -Mobilize mobilize mobilize -Encouraged to minimize opiate use -Changed Compazine to Thorazine given hiccups. -Continue Zofran -  General surgery following -Follow-up peritoneal fluid culture  PE/DVT: PE with an proximal superior lingular artery.  Appears chronic.  Possible DVT with right common iliac vein with central  rounded hyperattenuation extending to the level of the infrarenal IVC  -On IV heparin per heme-onc, Dr. Lindi Adie.    Leukocytosis with bandemia: Rising leukocytosis with left shift.  Discussed with hematology and ID on 11/24.  Felt to be inflammatory response infection.  He has no fever.  He completed 6 days of IV Zosyn on 11/21.  -Follow-up peritoneal fluid culture  Sinus tachycardia:Multifactoria including persistent ascites with carcinomatosis, abdominal distention, pain and deconditioning.  -Start p.o. metoprolol 25 mg twice daily -IV metoprolol 2.5 mg every 6 hours as needed   Transaminitis/bilirubinemia/elevated alkaline phosphatase: Slightly elevated LFT this morning -Continue monitoring  BPH without LUTS -Monitor urine output -Resume Flomax  Essential hypertension: Normotensive for most part. -Metoprolol as above  HLD:  -Hold Lipitor while n.p.o.   Goals of care: Overall prognosis is concerning but difficult to determine for sure until we know primary source of cancer and treatment options.  Moderate malnutrition Body mass index is 24.84 kg/m. Nutrition Problem: Moderate Malnutrition Etiology: acute illness (SBO following urethral surgery) Signs/Symptoms: energy intake < 75% for > 7 days, mild fat depletion, mild muscle depletion Interventions: TPN   DVT prophylaxis:  On heparin drip for PE and DVT  Code Status: Full code Family Communication: Updated patient's wife at bedside Level of care: Med-Surg Status is: Inpatient Remains inpatient appropriate because: Postop ileus    Final disposition: Home once medically cleared Consultants:  General surgery Hematology/oncology Infectious disease  Sch Meds:  Scheduled Meds:  Chlorhexidine Gluconate Cloth  6 each Topical Daily   insulin aspart  0-15 Units Subcutaneous Q4H   metoprolol tartrate  25 mg Oral BID   sodium chloride flush  10-40 mL Intracatheter Q12H   tamsulosin  0.4 mg Oral QPC supper   Continuous  Infusions:  sodium chloride 10 mL/hr at 06/30/22 1805   chlorproMAZINE (THORAZINE) 12.5 mg in sodium chloride 0.9 % 25 mL IVPB 12.5 mg (07/02/22 1124)   heparin 1,900 Units/hr (07/02/22 0556)   methocarbamol (ROBAXIN) IV 500 mg (06/25/22 0526)   TPN ADULT (ION) 85 mL/hr at 07/01/22 1726   TPN ADULT (ION)     PRN Meds:.acetaminophen, chlorproMAZINE (THORAZINE) 12.5 mg in sodium chloride 0.9 % 25 mL IVPB, HYDROmorphone (DILAUDID) injection, menthol-cetylpyridinium, methocarbamol (ROBAXIN) IV, metoprolol tartrate, ondansetron **OR** ondansetron (ZOFRAN) IV, oxyCODONE, phenol, sodium chloride flush  Antimicrobials: Anti-infectives (From admission, onward)    Start     Dose/Rate Route Frequency Ordered Stop   06/18/2022 0200  piperacillin-tazobactam (ZOSYN) IVPB 3.375 g  Status:  Discontinued       See Hyperspace for full Linked Orders Report.   3.375 g 12.5 mL/hr over 240 Minutes Intravenous Every 8 hours 06/15/2022 1849 06/27/22 1232   06/30/2022 1900  piperacillin-tazobactam (ZOSYN) IVPB 3.375 g       See Hyperspace for full Linked Orders Report.   3.375 g 100 mL/hr over 30 Minutes Intravenous  Once 06/12/2022 1849 06/30/2022 1940        I have personally reviewed the following labs and images: CBC: Recent Labs  Lab 06/26/22 0501 06/27/22 0433 06/28/22 0043 06/29/22 0304 06/30/22 0508 07/01/22 0410 07/02/22 0302  WBC 24.7* 25.4* 31.2* 37.7* 43.4* 42.3* 41.3*  NEUTROABS 22.0* 22.6*  --   --   --   --  37.2*  HGB 12.1* 12.7* 13.2 13.3 12.1* 12.3* 11.9*  HCT 38.0* 40.1 42.4 42.1 38.6* 38.5* 38.2*  MCV 94.3 94.8 94.4 94.8 96.0 94.6 95.5  PLT 558* 648* 705* 626* 588* 438* 384   BMP &GFR Recent Labs  Lab 06/27/22 0433 06/28/22 0043 06/29/22 0304 06/30/22 0508 07/02/22 0302  NA 145 147* 143 143 142  K 3.3* 3.7 3.9 3.8 3.7  CL 107 108 107 108 107  CO2 '29 31 28 29 25  '$ GLUCOSE 122* 156* 149* 156* 131*  BUN '20 21 23 '$ 33* 34*  CREATININE 0.85 0.82 0.88 0.91 0.80  CALCIUM 8.3* 8.6*  8.6* 8.7* 8.2*  MG 2.5* 2.5* 2.4 2.3 1.9  PHOS  --  2.7 2.8 3.2 3.6   Estimated Creatinine Clearance: 92.8 mL/min (by C-G formula based on SCr of 0.8 mg/dL). Liver & Pancreas: Recent Labs  Lab 06/26/22 0501 06/27/22 0433 06/28/22 0043 06/29/22 0304 07/02/22 0302  AST 41 '30 26 25 '$ 51*  ALT 89* 65* 48* 34 48*  ALKPHOS 144* 143* 141* 124 139*  BILITOT 1.3* 1.3* 1.2 1.1 1.7*  PROT 6.0* 6.6 6.8 7.0 5.9*  ALBUMIN 2.1* 2.2* 2.4* 2.3* 1.8*   No results for input(s): "LIPASE", "AMYLASE" in the last 168 hours. No results for input(s): "AMMONIA" in the last 168 hours. Diabetic: No results for input(s): "HGBA1C" in the last 72 hours. Recent Labs  Lab 07/01/22 1957 07/02/22 0000 07/02/22 0409 07/02/22 0726 07/02/22 1149  GLUCAP 137* 147* 136* 156* 149*   Cardiac Enzymes: No results for input(s): "CKTOTAL", "CKMB", "CKMBINDEX", "TROPONINI" in the last 168 hours. No results for input(s): "PROBNP" in the last 8760 hours. Coagulation Profile: No results for input(s): "INR", "PROTIME" in the last 168 hours. Thyroid Function Tests: No results for input(s): "TSH", "T4TOTAL", "FREET4", "T3FREE", "THYROIDAB" in the last 72 hours. Lipid Profile: No results for input(s): "CHOL", "HDL", "LDLCALC", "TRIG", "CHOLHDL", "LDLDIRECT" in the last 72 hours.  Anemia Panel: No results for input(s): "VITAMINB12", "FOLATE", "FERRITIN", "TIBC", "IRON", "RETICCTPCT" in the last 72 hours. Urine analysis:    Component Value Date/Time   COLORURINE AMBER (A) 07/04/2022 1542   APPEARANCEUR CLEAR 07/04/2022 1542   LABSPEC >=1.030 06/17/2022 1542   PHURINE 5.5 06/16/2022 1542   GLUCOSEU NEGATIVE 06/12/2022 1542   HGBUR NEGATIVE 07/01/2022 1542   BILIRUBINUR MODERATE (A) 06/15/2022 1542   BILIRUBINUR negative 07/04/2021 0936   BILIRUBINUR negative 12/22/2020 0902   KETONESUR 40 (A) 06/20/2022 1542   PROTEINUR 100 (A) 06/20/2022 1542   UROBILINOGEN 0.2 07/04/2021 0936   NITRITE POSITIVE (A) 06/14/2022  1542   LEUKOCYTESUR NEGATIVE 06/09/2022 1542   Sepsis Labs: Invalid input(s): "PROCALCITONIN", "LACTICIDVEN"  Microbiology: Recent Results (from the past 240 hour(s))  Body fluid culture w Gram Stain     Status: None (Preliminary result)   Collection Time: 06/30/22  3:25 PM   Specimen: PATH Cytology Peritoneal fluid  Result Value Ref Range Status   Specimen Description   Final    PERITONEAL Performed at [facility] [address], [place], [place] [address]    Special Requests   Final    NONE Performed at [facility], [place] [address], [place], [place] [address]    Gram Stain   Final    RARE WBC PRESENT, PREDOMINANTLY MONONUCLEAR NO ORGANISMS SEEN    Culture   Final    NO GROWTH 2 DAYS Performed at [facility], [place] [address], [place], [place] [address]    Report Status PENDING  Incomplete  Anaerobic culture w Gram Stain  Status: None (Preliminary result)   Collection Time: 06/30/22  3:25 PM   Specimen: Peritoneal Washings  Result Value Ref Range Status   Specimen Description PERITONEAL  Final   Special Requests NONE  Final   Gram Stain   Final    RARE WBC PRESENT, PREDOMINANTLY MONONUCLEAR NO ORGANISMS SEEN Performed at [facility], [address], [place], [place] [address]    Culture PENDING  Incomplete   Report Status PENDING  Incomplete    Radiology Studies: No results found.    Angalena Cousineau T. [place]  If 7PM-7AM, please contact night-coverage [email or web address] 07/02/2022, 1:49 PM

## 2022-07-02 NOTE — Progress Notes (Signed)
   07/02/22 1814  Assess: MEWS Score  Temp 97.9 F (36.6 C)  BP (!) 90/57  MAP (mmHg) 68  Pulse Rate (!) 145  Resp 16  SpO2 98 %  O2 Device Nasal Cannula  O2 Flow Rate (L/min) 2 L/min  Assess: MEWS Score  MEWS Temp 0  MEWS Systolic 1  MEWS Pulse 3  MEWS RR 0  MEWS LOC 0  MEWS Score 4  MEWS Score Color Red  Assess: if the MEWS score is Yellow or Red  Were vital signs taken at a resting state? Yes  Focused Assessment Change from prior assessment (see assessment flowsheet)  Does the patient meet 2 or more of the SIRS criteria? No  MEWS guidelines implemented *See Row Information* Yes  Treat  MEWS Interventions Administered prn meds/treatments  Take Vital Signs  Increase Vital Sign Frequency  Red: Q 1hr X 4 then Q 4hr X 4, if remains red, continue Q 4hrs  Escalate  MEWS: Escalate Red: discuss with charge nurse/RN and provider, consider discussing with RRT  Notify: Charge Nurse/RN  Name of Charge Nurse/RN Notified Nevin Bloodgood  Date Charge Nurse/RN Notified 07/02/22  Time Charge Nurse/RN Notified 1814  Provider Notification  Provider Name/Title Gonfa  Date Provider Notified 07/02/22  Time Provider Notified 1818  Method of Notification Page  Notification Reason Other (Comment)  Document  Patient Outcome Not stable and remains on department  Progress note created (see row info) Yes  Assess: SIRS CRITERIA  SIRS Temperature  0  SIRS Pulse 1  SIRS Respirations  0  SIRS WBC 1  SIRS Score Sum  2

## 2022-07-02 NOTE — Progress Notes (Signed)
10 Days Post-Op   Subjective/Chief Complaint: Having bm's, tolerated clamping yesterday   Objective: Vital signs in last 24 hours: Temp:  [97.7 F (36.5 C)-98.5 F (36.9 C)] 98.3 F (36.8 C) (11/26 0415) Pulse Rate:  [123-138] 123 (11/26 0415) Resp:  [16-18] 18 (11/26 0415) BP: (110-130)/(76-90) 110/76 (11/26 0415) SpO2:  [93 %-97 %] 95 % (11/26 0415) Weight:  [80.8 kg] 80.8 kg (11/26 0700) Last BM Date : 07/01/22  Intake/Output from previous day: 11/25 0701 - 11/26 0700 In: 1917.1 [I.V.:1917.1] Out: 0  Intake/Output this shift: No intake/output data recorded.  Ab less distended, incisions clean nontender  Lab Results:  Recent Labs    07/01/22 0410 07/02/22 0302  WBC 42.3* 41.3*  HGB 12.3* 11.9*  HCT 38.5* 38.2*  PLT 438* 384    BMET Recent Labs    06/30/22 0508 07/02/22 0302  NA 143 142  K 3.8 3.7  CL 108 107  CO2 29 25  GLUCOSE 156* 131*  BUN 33* 34*  CREATININE 0.91 0.80  CALCIUM 8.7* 8.2*    PT/INR No results for input(s): "LABPROT", "INR" in the last 72 hours. ABG No results for input(s): "PHART", "HCO3" in the last 72 hours.  Invalid input(s): "PCO2", "PO2"  Studies/Results: US Paracentesis  Result Date: 06/30/2022 INDICATION: Metastatic adenocarcinoma of unknown primary with ascites. Request for diagnostic and therapeutic paracentesis. EXAM: ULTRASOUND GUIDED PARACENTESIS MEDICATIONS: 1% lidocaine 10 mL COMPLICATIONS: None immediate. PROCEDURE: Informed written consent was obtained from the patient after a discussion of the risks, benefits and alternatives to treatment. A timeout was performed prior to the initiation of the procedure. Initial ultrasound scanning demonstrates a large amount of ascites within the right lateral abdomen. The right lateral abdomen was prepped and draped in the usual sterile fashion. 1% lidocaine was used for local anesthesia. Following this, a 19 gauge, 7-cm, Yueh catheter was introduced. An ultrasound image was  saved for documentation purposes. The paracentesis was performed. The catheter was removed and a dressing was applied. The patient tolerated the procedure well without immediate post procedural complication. FINDINGS: A total of approximately 4.2 L of amber fluid was removed. Samples were sent to the laboratory as requested by the clinical team. IMPRESSION: Successful ultrasound-guided paracentesis yielding 4.2 liters of peritoneal fluid. Procedure performed by: Gareth Eagle, PA-C Electronically Signed   By: Lucrezia Europe M.D.   On: 06/30/2022 16:27    Anti-infectives: Anti-infectives (From admission, onward)    Start     Dose/Rate Route Frequency Ordered Stop   06/29/2022 0200  piperacillin-tazobactam (ZOSYN) IVPB 3.375 g  Status:  Discontinued       See Hyperspace for full Linked Orders Report.   3.375 g 12.5 mL/hr over 240 Minutes Intravenous Every 8 hours 06/11/2022 1849 06/27/22 1232   06/19/2022 1900  piperacillin-tazobactam (ZOSYN) IVPB 3.375 g       See Hyperspace for full Linked Orders Report.   3.375 g 100 mL/hr over 30 Minutes Intravenous  Once 07/04/2022 1849 06/24/2022 1940       Assessment/Plan: Post operative abdominal pain with ascites after umbilical hernia repair with mesh by Dr. Rosendo Gros 06/08/22  -POD# 10 s/p LAPAROSCOPY DIAGNOSTIC, BIOPSY OF OMENTAL AND PERITONEAL NODULES, ASPIRATION OF ASCITES FOR CYTOLOGY 11/16 Dr. Ninfa Linden - pathology and cytology resulted - moderately differentiated adenocarcinoma - CT C/A/P 11/18 showed chronic PE and right common iliac DVT - now on heparin gtt with no sign of bleeding CT showed evidence of peritoneal carcinomatosis but no obvious sign of primary tumor.  No pulmonary tumor or mets noted. Repeat ct scan shows no real change  - d/c ng today, continue tpn, try clears - Mobilize   ID - wbc stable FEN - clears, TPN started 11/21  VTE - hep gtt Foley - d/c 11/17, voiding  HTN HLD BPH Hx UTI    Cameron Rice [id number]/8502

## 2022-07-02 NOTE — Progress Notes (Signed)
ANTICOAGULATION CONSULT NOTE - Follow Up Consult  Pharmacy Consult for Heparin Indication: pulmonary embolus  No Known Allergies  Patient Measurements: Height: '5\' 11"'$  (180.3 cm) Weight: 80.8 kg (178 lb 2.1 oz) IBW/kg (Calculated) : 75.3 Heparin Dosing Weight:  80.8 kg  Vital Signs: Temp: 97.9 F (36.6 C) (11/26 1814) Temp Source: Oral (11/26 1814) BP: 111/63 (11/26 1836) Pulse Rate: 150 (11/26 1836)  Labs: Recent Labs    06/30/22 0508 07/01/22 0319 07/01/22 0410 07/01/22 1220 07/02/22 0302 07/02/22 1330  HGB 12.1*  --  12.3*  --  11.9*  --   HCT 38.6*  --  38.5*  --  38.2*  --   PLT 588*  --  438*  --  384  --   HEPARINUNFRC 0.54   < >  --  0.35 0.14* 0.34  CREATININE 0.91  --   --   --  0.80  --    < > = values in this interval not displayed.     Estimated Creatinine Clearance: 92.8 mL/min (by C-G formula based on SCr of 0.8 mg/dL).   Brief Assessment: 72 yoM admitted on 11/15.  Pharmacy was consulted on 11/18 to dose heparin IV for new PE on CT (and suspicious for right common iliac DVT). Dopplers negative for DVT.  Heparin level remains therapeutic at 0.53 on heparin 1900 units/hr No bleeding or complications reported.   Goal of Therapy:  Heparin level 0.3-0.7 units/ml Monitor platelets by anticoagulation protocol: Yes   Plan:  Continue heparin IV infusion at 1900 units/hr Daily heparin level and CBC   Gretta Arab PharmD, BCPS [facility] main pharmacy [phone] 07/02/2022 7:10 PM

## 2022-07-03 ENCOUNTER — Inpatient Hospital Stay (HOSPITAL_COMMUNITY): Payer: BC Managed Care – PPO

## 2022-07-03 DIAGNOSIS — J9601 Acute respiratory failure with hypoxia: Secondary | ICD-10-CM | POA: Diagnosis not present

## 2022-07-03 DIAGNOSIS — K56609 Unspecified intestinal obstruction, unspecified as to partial versus complete obstruction: Secondary | ICD-10-CM | POA: Diagnosis not present

## 2022-07-03 DIAGNOSIS — R0603 Acute respiratory distress: Secondary | ICD-10-CM | POA: Diagnosis not present

## 2022-07-03 LAB — LACTIC ACID, PLASMA: Lactic Acid, Venous: 2.6 mmol/L (ref 0.5–1.9)

## 2022-07-03 LAB — COMPREHENSIVE METABOLIC PANEL
ALT: 46 U/L — ABNORMAL HIGH (ref 0–44)
ALT: 36 U/L (ref 0–44)
AST: 41 U/L (ref 15–41)
AST: 31 U/L (ref 15–41)
Albumin: 1.7 g/dL — ABNORMAL LOW (ref 3.5–5.0)
Albumin: 2.4 g/dL — ABNORMAL LOW (ref 3.5–5.0)
Alkaline Phosphatase: 126 U/L (ref 38–126)
Alkaline Phosphatase: 92 U/L (ref 38–126)
Anion gap: 11 (ref 5–15)
Anion gap: 12 (ref 5–15)
BUN: 71 mg/dL — ABNORMAL HIGH (ref 8–23)
BUN: 65 mg/dL — ABNORMAL HIGH (ref 8–23)
CO2: 25 mmol/L (ref 22–32)
CO2: 25 mmol/L (ref 22–32)
Calcium: 8.1 mg/dL — ABNORMAL LOW (ref 8.9–10.3)
Calcium: 8 mg/dL — ABNORMAL LOW (ref 8.9–10.3)
Chloride: 104 mmol/L (ref 98–111)
Chloride: 103 mmol/L (ref 98–111)
Creatinine, Ser: 2.22 mg/dL — ABNORMAL HIGH (ref 0.61–1.24)
Creatinine, Ser: 1.6 mg/dL — ABNORMAL HIGH (ref 0.61–1.24)
GFR, Estimated: 31 mL/min — ABNORMAL LOW (ref >60)
GFR, Estimated: 46 mL/min — ABNORMAL LOW (ref >60)
Glucose, Bld: 144 mg/dL — ABNORMAL HIGH (ref 70–99)
Glucose, Bld: 151 mg/dL — ABNORMAL HIGH (ref 70–99)
Potassium: 3.9 mmol/L (ref 3.5–5.1)
Potassium: 3.6 mmol/L (ref 3.5–5.1)
Sodium: 140 mmol/L (ref 135–145)
Sodium: 140 mmol/L (ref 135–145)
Total Bilirubin: 2.3 mg/dL — ABNORMAL HIGH (ref 0.3–1.2)
Total Bilirubin: 2.7 mg/dL — ABNORMAL HIGH (ref 0.3–1.2)
Total Protein: 5.5 g/dL — ABNORMAL LOW (ref 6.5–8.1)
Total Protein: 5.2 g/dL — ABNORMAL LOW (ref 6.5–8.1)

## 2022-07-03 LAB — CBC WITH DIFFERENTIAL/PLATELET
Abs Immature Granulocytes: 1.81 10*3/uL — ABNORMAL HIGH (ref 0.00–0.07)
Basophils Absolute: 0.2 10*3/uL — ABNORMAL HIGH (ref 0.0–0.1)
Basophils Relative: 1 %
Eosinophils Absolute: 0 10*3/uL (ref 0.0–0.5)
Eosinophils Relative: 0 %
HCT: 32.5 % — ABNORMAL LOW (ref 39.0–52.0)
Hemoglobin: 10.5 g/dL — ABNORMAL LOW (ref 13.0–17.0)
Immature Granulocytes: 5 %
Lymphocytes Relative: 6 %
Lymphs Abs: 2.1 10*3/uL (ref 0.7–4.0)
MCH: 30 pg (ref 26.0–34.0)
MCHC: 32.3 g/dL (ref 30.0–36.0)
MCV: 92.9 fL (ref 80.0–100.0)
Monocytes Absolute: 1.8 10*3/uL — ABNORMAL HIGH (ref 0.1–1.0)
Monocytes Relative: 6 %
Neutro Abs: 27.7 10*3/uL — ABNORMAL HIGH (ref 1.7–7.7)
Neutrophils Relative %: 82 %
Platelets: 333 10*3/uL (ref 150–400)
RBC: 3.5 MIL/uL — ABNORMAL LOW (ref 4.22–5.81)
RDW: 15.4 % (ref 11.5–15.5)
WBC: 33.7 10*3/uL — ABNORMAL HIGH (ref 4.0–10.5)
nRBC: 9.8 % — ABNORMAL HIGH (ref 0.0–0.2)

## 2022-07-03 LAB — HEPARIN LEVEL (UNFRACTIONATED)
Heparin Unfractionated: 0.81 IU/mL — ABNORMAL HIGH (ref 0.30–0.70)
Heparin Unfractionated: 0.3 IU/mL (ref 0.30–0.70)

## 2022-07-03 LAB — URINALYSIS, ROUTINE W REFLEX MICROSCOPIC
Bilirubin Urine: NEGATIVE
Glucose, UA: NEGATIVE mg/dL
Hgb urine dipstick: NEGATIVE
Ketones, ur: NEGATIVE mg/dL
Leukocytes,Ua: NEGATIVE
Nitrite: NEGATIVE
Protein, ur: NEGATIVE mg/dL
Specific Gravity, Urine: 1.041 — ABNORMAL HIGH (ref 1.005–1.030)
pH: 5 (ref 5.0–8.0)

## 2022-07-03 LAB — BLOOD GAS, ARTERIAL
Acid-Base Excess: 5.7 mmol/L — ABNORMAL HIGH (ref 0.0–2.0)
Bicarbonate: 28.6 mmol/L — ABNORMAL HIGH (ref 20.0–28.0)
Drawn by: 422461
O2 Saturation: 100 %
Patient temperature: 36.8
pCO2 arterial: 35 mmHg (ref 32–48)
pH, Arterial: 7.52 — ABNORMAL HIGH (ref 7.35–7.45)
pO2, Arterial: 101 mmHg (ref 83–108)

## 2022-07-03 LAB — GLUCOSE, CAPILLARY
Glucose-Capillary: 133 mg/dL — ABNORMAL HIGH (ref 70–99)
Glucose-Capillary: 137 mg/dL — ABNORMAL HIGH (ref 70–99)
Glucose-Capillary: 141 mg/dL — ABNORMAL HIGH (ref 70–99)
Glucose-Capillary: 147 mg/dL — ABNORMAL HIGH (ref 70–99)
Glucose-Capillary: 148 mg/dL — ABNORMAL HIGH (ref 70–99)
Glucose-Capillary: 158 mg/dL — ABNORMAL HIGH (ref 70–99)
Glucose-Capillary: 164 mg/dL — ABNORMAL HIGH (ref 70–99)
Glucose-Capillary: 165 mg/dL — ABNORMAL HIGH (ref 70–99)

## 2022-07-03 LAB — CBC
HCT: 25.4 % — ABNORMAL LOW (ref 39.0–52.0)
Hemoglobin: 8.2 g/dL — ABNORMAL LOW (ref 13.0–17.0)
MCH: 30.1 pg (ref 26.0–34.0)
MCHC: 32.3 g/dL (ref 30.0–36.0)
MCV: 93.4 fL (ref 80.0–100.0)
Platelets: 255 10*3/uL (ref 150–400)
RBC: 2.72 MIL/uL — ABNORMAL LOW (ref 4.22–5.81)
RDW: 15.5 % (ref 11.5–15.5)
WBC: 25.6 10*3/uL — ABNORMAL HIGH (ref 4.0–10.5)
nRBC: 4.8 % — ABNORMAL HIGH (ref 0.0–0.2)

## 2022-07-03 LAB — MAGNESIUM
Magnesium: 1.9 mg/dL (ref 1.7–2.4)
Magnesium: 1.8 mg/dL (ref 1.7–2.4)

## 2022-07-03 LAB — BRAIN NATRIURETIC PEPTIDE: B Natriuretic Peptide: 44.6 pg/mL (ref 0.0–100.0)

## 2022-07-03 LAB — BODY FLUID CELL COUNT WITH DIFFERENTIAL
Eos, Fluid: 0 %
Lymphs, Fluid: 29 %
Monocyte-Macrophage-Serous Fluid: 30 % — ABNORMAL LOW (ref 50–90)
Neutrophil Count, Fluid: 41 % — ABNORMAL HIGH (ref 0–25)
Total Nucleated Cell Count, Fluid: 1043 cu mm — ABNORMAL HIGH (ref 0–1000)

## 2022-07-03 LAB — ECHOCARDIOGRAM COMPLETE
Area-P 1/2: 4.31 cm2
Calc EF: 67 %
Height: 71 in
S' Lateral: 2.35 cm
Single Plane A2C EF: 63.2 %
Single Plane A4C EF: 68.4 %
Weight: 2687.85 oz

## 2022-07-03 LAB — PHOSPHORUS: Phosphorus: 4.1 mg/dL (ref 2.5–4.6)

## 2022-07-03 LAB — TRIGLYCERIDES: Triglycerides: 243 mg/dL — ABNORMAL HIGH (ref <150)

## 2022-07-03 MED ORDER — NOREPINEPHRINE 4 MG/250ML-% IV SOLN
0.0000 ug/min | INTRAVENOUS | Status: DC
  Administered 2022-07-03: 2 ug/min via INTRAVENOUS
  Administered 2022-07-03: 7 ug/min via INTRAVENOUS
  Administered 2022-07-04: 47 ug/min via INTRAVENOUS
  Administered 2022-07-04: 35 ug/min via INTRAVENOUS
  Administered 2022-07-04: 45 ug/min via INTRAVENOUS
  Filled 2022-07-03 (×6): qty 250

## 2022-07-03 MED ORDER — PIPERACILLIN-TAZOBACTAM 3.375 G IVPB
3.3750 g | Freq: Three times a day (TID) | INTRAVENOUS | Status: DC
  Administered 2022-07-03 – 2022-07-04 (×5): 3.375 g via INTRAVENOUS
  Filled 2022-07-03 (×5): qty 50

## 2022-07-03 MED ORDER — HEPARIN (PORCINE) 25000 UT/250ML-% IV SOLN
1700.0000 [IU]/h | INTRAVENOUS | Status: DC
  Administered 2022-07-04: 1700 [IU]/h via INTRAVENOUS
  Filled 2022-07-03: qty 250

## 2022-07-03 MED ORDER — ALBUMIN HUMAN 25 % IV SOLN
25.0000 g | Freq: Four times a day (QID) | INTRAVENOUS | Status: AC
  Administered 2022-07-03 – 2022-07-04 (×4): 25 g via INTRAVENOUS
  Filled 2022-07-03 (×4): qty 100

## 2022-07-03 MED ORDER — SODIUM CHLORIDE 0.9 % IV SOLN: INTRAVENOUS | Status: DC

## 2022-07-03 MED ORDER — PHENYLEPHRINE HCL-NACL 20-0.9 MG/250ML-% IV SOLN
0.0000 ug/min | INTRAVENOUS | Status: DC
  Administered 2022-07-03 (×2): 90 ug/min via INTRAVENOUS
  Administered 2022-07-03: 20 ug/min via INTRAVENOUS
  Filled 2022-07-03 (×2): qty 250

## 2022-07-03 MED ORDER — LACTATED RINGERS IV BOLUS
1000.0000 mL | Freq: Once | INTRAVENOUS | Status: AC
  Administered 2022-07-03: 1000 mL via INTRAVENOUS

## 2022-07-03 MED ORDER — TRAVASOL 10 % IV SOLN
INTRAVENOUS | Status: AC
  Filled 2022-07-03: qty 1081.2

## 2022-07-03 MED ORDER — DEXMEDETOMIDINE HCL IN NACL 200 MCG/50ML IV SOLN: 0.0000 ug/kg/h | INTRAVENOUS | Status: DC

## 2022-07-03 MED ORDER — LACTATED RINGERS IV BOLUS
500.0000 mL | Freq: Once | INTRAVENOUS | Status: AC
  Administered 2022-07-03: 500 mL via INTRAVENOUS

## 2022-07-03 NOTE — Procedures (Signed)
Paracentesis Procedure Note  Cameron Rice  [id number]  [birth year]  Date:07/03/22  Time:11:44 AM   Provider Performing:Mclean Moya M Ayesha Rumpf   Procedure: Paracentesis with imaging guidance [phone])  Indication(s): Ascites  Consent: Risks of the procedure as well as the alternatives and risks of each were explained to the patient and/or caregiver.  Consent for the procedure was obtained and is signed in the bedside chart  Anesthesia: Topical only with 1% lidocaine   Time Out: Verified patient identification, verified procedure, site/side was marked, verified correct patient position, special equipment/implants available, medications/allergies/relevant history reviewed, required imaging and test results available.  Sterile Technique: Maximal sterile technique including full sterile barrier drape, hand hygiene, sterile gown, sterile gloves, mask, hair covering, sterile ultrasound probe cover (if used).  Procedure Description: Ultrasound used to identify appropriate peritoneal anatomy for placement and overlying skin marked.  Area of drainage cleaned and draped in sterile fashion. Lidocaine was used to anesthetize the skin and subcutaneous tissue.  2.5L of transparent amber-colored fluid was drained. Catheter then removed and sterile gauze/Tegaderm applied to the site.    Complications/Tolerance: None; patient tolerated the procedure well.  EBL: Minimal  Specimen(s): Peritoneal fluid  Lestine Mount, PA-C [facility] 07/03/22 11:45 AM  Please see [email or web address] for pager details.  From 7A-7P if no response, please call [phone] After hours, please call ELink [phone]

## 2022-07-03 NOTE — Progress Notes (Addendum)
Patient arrived to unit around 2345.  A/O x4. HR 140s. BP 90 systolic. Heparin and TPN Continuous.   Abdomen distended.  Shortly after pt arrives has emesis episode. Olena Heckle NP paged to come to bedside to assess patient.  Attempted to place NG Tube per NP Request-not successful-During attempt patient had 2 more emesis episodes.  Patient transferred to ICU.

## 2022-07-03 NOTE — Progress Notes (Signed)
Date and time results received: 07/02/22 2037  (use smartphrase ".now" to insert current time)  Test: Lactic acid Critical Value: Lactic acid 2.6  Name of Provider Notified: Gershon Cull, NP  Orders Received? Or Actions Taken?: PRN Meds given.

## 2022-07-03 NOTE — Progress Notes (Addendum)
Date and time results received: 07/02/2010  2011pm. (use smartphrase ".now" to insert current time)  Test: Blood gas Critical Value: PO2 31  Name of Provider Notified: Gershon Cull, NP  Orders Received? Or Actions Taken?: Ordered 3L O2 via [place].

## 2022-07-03 NOTE — Progress Notes (Addendum)
Verbal order from Dr. Tamala Julian to give patient 1000 mL LR bolus for BP/fluid support. Administered now.

## 2022-07-03 NOTE — Progress Notes (Signed)
PHARMACY - TOTAL PARENTERAL NUTRITION CONSULT NOTE   Indication: SBO with Prolonged ileus  Patient Measurements: Height: _0  (180.3 cm) Weight: 80.8 kg (178 lb 2.1 oz) IBW/kg (Calculated) : 75.3 TPN AdjBW (KG): 78 Body mass index is 24.84 kg/m. Usual Weight:    Assessment:  Post operative abdominal pain with ascites after umbilical hernia repair with mesh on 06/08/22.   On 11/16 he had Lap biopsy and ascites aspiration.  Now has ileus with SBO.  Glucose / Insulin: No h/o DM noted. A1C 4.8.  - CBGs <180 (148 - 167) - SSI used: 13 units last 24h Electrolytes: Na remains 140 (none in TPN), K 3.9, Mag 1.9, Phos 4.1 (goal K>=4 and Mg>=2 with ileus.) CoCa 9.94 ok.  Renal: h/o BPH (holding Flomax) Scr up to 2.22, BUN 71.prerenal AKI 2nd ATN,  Hepatic: AST/ALT/Alk phos: OK.  Tbili 2.3 up, no jaundice Triglycerides 189> 243. Albumin 1.7 Intake / Output; MIVF:  Output:  NG 700 mls since NG replaced last night, UOP 1000 mls/24hr, LBM 11/25 PO: 210 mls/24h (CLD) mIVF:0.45%NS @ 10 ml/hr & NS @ 50 ml/hr, TPN @ 85 ml/hr  GI Imaging: 11/15: CT: Mildly dilated loops of small bowel in the left lower quadrant with scattered air-fluid levels, nonspecific for ileus versus early obstruction 11/17: Xray: ileus vs obstruction 11/18: CT:  peritoneal carcinomatosis,  partial bowel obstruction/ileus  GI Surgeries / Procedures:  11/2 hernia repair 11/16 diagnostic laparoscopy  Central access: PICC 11/21 TPN start date: 11/21  Nutritional Goals: Goal TPN rate is  85 mL/hr to provide 108 g of protein and 2154 kcals per day)  RD Assessment: Estimated Needs Total Energy Estimated Needs: 2100-2300 Total Protein Estimated Needs: 95-110g Total Fluid Estimated Needs: 2.1L/day  Current Nutrition:  NPO and TPN NG LIWS   Plan:  Continue TPN at goal rate 4m/hr at 1800.  Electrolytes in TPN: Na 032m/L, K 45 mEq/L, Ca 61m27mL, Mg 2mE51m, and Phos 10mm7m. Cl:Ac 1:2 (Unable to keep 1:1 ratio with  elyte doses) Add standard MVI and trace elements to TPN IVF NS 50 ml/hr & 45NS 10 ml/hr per MD Monitor TPN labs on Mon/Thurs and PRN  MicheEudelia [email or web address] Use secure chat for questions 07/03/2022 10:05 AM

## 2022-07-03 NOTE — Progress Notes (Signed)
[facility] Progress Note Patient Name: Cameron Rice DOB: Mar 28, 1951 MRN: [id number]   Date of Service  07/03/2022  HPI/Events of Note  CCM just consulted on this patient. Bedside NP has talked to Dr Ruthann Cancer who will evaluate. Briefly, 71 year old man with umbilical hernia repair on 11/2, now back with SBO. Has had diagnostic lap that showed peritoneal carcinomatosis. Has been on TPN with suspected improving ileus. In addition also had a PE and has had ongoing sinus tachycardia. RRT called for worsening O2 needs, had attempts at placing NG tube that had resulted in vomiting, unclear if aspirated. Now brought down to ICU. HR in 140 (unchanged). MAP is 67. Awake and alert. O2 sat is 97 on 100% HFNC with 40 liter flow. NP and RN at bedside. Patient getting fluid bolus and they are inserting an NG tube again since they feel his belly is distended and since he is nauseous.   eICU Interventions  Fluid resuscitation NPO Continue HFNC Adding Zosyn  On iv heparin  Is on TPN  Guarded prognosis Will need imaging once NG is in and will also get an ABG once he settles Will be evaluated by ground team      Intervention Category Major Interventions: Respiratory failure - evaluation and management;Sepsis - evaluation and management Evaluation Type: New Patient Evaluation  Margaretmary Lombard 07/03/2022, 2:12 AM

## 2022-07-03 NOTE — Progress Notes (Signed)
NAME:  Cameron Rice, MRN:  [id number], DOB:  06/10/1951, LOS: 12 ADMISSION DATE:  06/28/2022, CONSULTATION DATE:  07/03/2022 REFERRING MD:  Gershon Cull NP CHIEF COMPLAINT:  Acute resp failure with shock  History of Present Illness:  71 yo male with PMHx significant for HTN, hyperlipidemia, recurrent UTI and umbilical hernia repair (Nov 2) who presented to [facility] 11/15 with increasing abdominal distention and malaise. Patient reported 2 days of n/v as well as abdominal pain/distention since his repair. He also had not had a bowel movement since the operation. He originally was seen at [facility] where CT A/P revealed possible metastatic disease as well as SBO. He was admitted with general surgery consultation for SBO and suspected intraperitoneal carcinomatosis. He denied any blood in his vomit or stool. No fever/chills.   During this hospitalization he has been since gone back to the OR for diagnostic lap which did reveal peritoneal carcinomatosis (Oncology consulted) . He has had an intraoperative paracentesis as well as a subsequent one (11/24). On 11/27, patient again appeared to have large volume ascites. Over the course of his hospitalization he has been treated for SBO with NG decompression. He was also found to have a PE without associated DVT and was started on heparin gtt 11/18. Per surgical notes, he was having some improvement with passing of gas and small BM. His NG was discontinued morning of 11/26 after having tolerated clamping trial with clears. Unfortunately 11/26PM he required transfer to ICU after being noted to be tachycardic and hypotensive. Repeat CTA Chest 11/26 revealed no additional PE but aspiration as well as large volume ascites and a fluid filled esophagus. Upon exam by [facility], abdomen was quite distended and tympanic. NG was attempted to be placed on floor with subsequent vomiting and obvious aspiration.   Upon arrival to ICU patient remained tachycardic (140s) was given small  volume fluid resuscitation but remained hypotensive with SBP 70s, tachypneic and requiring HHFNC 100% and 40L. He states he is feeling some relief with the NG placement that just occurred and already as ~224m output of dark contents. CCM was consulted for hypotension and concern for need for airway. He is able to talk at this time is oriented and alert. He does have some conversational dyspnea. He states he would want intubation if absolutely necessary. He is agreeable to pressors and would like to remain a full code.   Pertinent Medical History:  HTN Hyperlipidemia Umbilical hernia s/p repair 06/08/22 Peritoneal carcinomatosis unknown primary  Significant Hospital Events: Including procedures, antibiotic start and stop dates in addition to other pertinent events   [facility] Ex lap with frozen sections + for adenocarcinoma, awaiting final path 11/18 Dx with PE started on heparin 11/19 Negative LE dopplers 11/26 Aspiration event  11/27 Transferred to ICU, NG replaced, ongoing tachycardia/hypotension  Interim History / Subjective:  Transferred to ICU overnight after repeat aspiration event NGT replaced with immediate output of gastric contents Ongoing tachycardia to 140s, hypotension with SBP 80s (MAP 60s) Reports less nausea, no vomiting since NG placement Ongoing distention but denies pain No flatus/BM Ongoing TPN Neo transitioning to Levo  Objective:  Blood pressure (!) 87/60, pulse (!) 141, temperature 98 F (36.7 C), temperature source Axillary, resp. rate (!) 36, height '5\' 11"'$  (1.803 m), weight 80.8 kg, SpO2 95 %.    FiO2 (%):  [100 %] 100 %   Intake/Output Summary (Last 24 hours) at 07/03/2022 0733 Last data filed at 07/03/2022 0721 Gross per  24 hour  Intake 4307.91 ml  Output 700 ml  Net 3607.91 ml    Filed Weights   07/04/2022 1132 07/01/22 0500 07/02/22 0700  Weight: 78 kg 78.5 kg 80.8 kg   Physical Examination: General: Acutely  ill-appearing middle aged man in NAD. Appears uncomfortable. HEENT: [place]/AT, anicteric sclera, PERRL, dry mucous membranes. Neuro: Awake, oriented x 4. Responds to verbal stimuli. Following commands consistently. Moves all 4 extremities spontaneously. Generalized weakness. CV: Tachycardic to 140s, no m/g/r. PULM: Breathing tachypneic and mildly labored on HHFNC. Lung fields diminished with poor inspiratory effort 2/2 splinting. GI: Firm, moderately distended, no significant TTP. Hypoactive bowel sounds. Dull to percussion. Extremities: No LE edema noted. Skin: Warm/dry, no rashes.  Resolved Hospital Problem List:    Assessment & Plan:  Septic shock secondary to aspiration Lactic acidosis - Transferred to ICU overnight - Goal MAP > 65 - Fluid resuscitation as tolerated, cautious with volume status - Levophed titrated to goal MAP, d/c Neo - Trend WBC, fever curve, LA - F/u Cx data - Continue broad-spectrum antibiotics (Zosyn)  Acute hypoxic resp failure PE, acute Aspiration pneumonia - Continue supplemental O2 support - HHFNC - NOT a BiPAP candidate due to aspiration risk; remains high risk for intubation - Wean O2 for sat > 90% - Pulmonary hygiene - Antibiotic coverage as above - F/u Echo - Heparin gtt for PE  Peritoneal carcinomatosis Ascites, malignant in nature Ileus Noted, may warrant peritoneal drain in meantime to decrease need for para as treatment plan is determined. Path/cyto with moderately differentiated adenocarcinoma. - NPO status - Continue NGT - Continue TPN - Paracentesis as needed, last 11/24 with 4.2L removed; possible repeat today 11/27 - Oncology consulted - Family potentially wishing to transition to [facility]  ARF secondary to ATN, prerenal AKI Cr uptrending (2.22), baseline 0.8. Multifactorial in the setting of shock/hypotension and poor PO intake/GI losses. - Trend BMP - Replete electrolytes as indicated - Monitor I&Os - Avoid nephrotoxic agents as  able - Ensure adequate renal perfusion  GOC - Ongoing GOC discussion necessary in the setting of poor prognosis with widespread peritoneal carcinomatosis (moderately differentiated adeno with unknown primary) - Patient/family desire full scope of care at present - Low threshold for PMT involvement  Best Practice: (right click and "Reselect all SmartList Selections" daily)   Diet/type: TPN DVT prophylaxis: systemic heparin GI prophylaxis: N/A Lines: Central line Foley:  Yes, and it is still needed Code Status:  full code Last date of multidisciplinary goals of care discussion [11/27AM with pt]  Critical care time: 43 minutes   Lestine Mount, [place] [facility] 07/03/22 7:34 AM  Please see [email or web address] for pager details.  From 7A-7P if no response, please call [phone] After hours, please call ELink [phone]

## 2022-07-03 NOTE — Progress Notes (Signed)
  Echocardiogram 2D Echocardiogram has been performed.  Cameron Rice 07/03/2022, 2:42 PM

## 2022-07-03 NOTE — Progress Notes (Signed)
[facility] Progress Note Patient Name: Cameron Rice DOB: 11/01/1950 MRN: [id number]   Date of Service  07/03/2022  HPI/Events of Note  Received request to discontinue metoprolol as patient is now on norepinephrine  Head CT negative for acute findings  eICU Interventions  Discontinued metroprolol     Intervention Category Intermediate Interventions: Medication change / dose adjustment  Judd Lien 07/03/2022, 11:44 PM

## 2022-07-03 NOTE — Consult Note (Signed)
NAME:  Cameron Rice, MRN:  [id number], DOB:  1950-08-18, LOS: 12 ADMISSION DATE:  06/26/2022, CONSULTATION DATE:  07/03/22 REFERRING MD:  Gershon Cull NP, CHIEF COMPLAINT:  acute resp failure with shock  History of Present Illness:  71 yo male with pmh HTN, hyperlipidemia, recurrent UTI and umbilical hernia repair on Nov 2. Pt presented back to [facility] on 11/15 with increasing abdominal distention and generally feeling unwell. Per chart review and pt history he has 2 days of n/v as well as abdominal pain and distention since his repair. He also had not had a bowel movement since the operation. [facility] abdomen revealed possible metastatic disease as well as sbo. He was admitted with general surgery consultation for sbo and suspected intraperitoneal carcinomatosis. He denied any blood in his vomit or stool. No fever/chills.   During this hospitalization he has been since gone back to the OR for diagnostic lap which did reveal peritoneal carcinomatosis (oncology consulted) . He has had an intraoperative paracentesis as well as a subsequent one 11/24. Today he again appears to have large volume ascites. Over the course of his hospitalization he has been treated for sbo with ng decompression. He was also found to have a PE without associated DVT and was started on heparin gtt on 11/18. Per surgical notes he was having some improvement with passive of gas and small bm. His NG was discontinued morning of 11/26 after having tolerated clamping trial with clears. Unfortunately this evening he required transfer to ICU after being noted to be tachycardic and hypotensive. Repeat CTPA revealed no additional Pe's but aspiration as well as large volume ascites and a fluid filled esophagus. Upon exam by [facility] his abdomen was quite distended and tympanic. NG was attempted to be placed on floor with subsequent vomiting and obvious aspiration.   Upon arrival to ICU pt remains  tachycardic (140's) and has been given some small volume bolus. He remains hypotensive with sbp in 70's. He is tachypneic and req HHFNC 100% and 40L. He states he is feeling some relief with the ng placement that just occurred and already as ~288m output of dark contents. CCM was consulted for hypotension and concern for airway. He is able to talk at this time is oriented and alert. He does have some conversational dyspnea. He states he would want intubation if absolutely necessary. He is agreeable to pressors and would like to remain a full code.     Pertinent  Medical History  HTN Hyperlipidemia Umbilical hernia s/p repair 06/08/22 Peritoneal carcinomatosis unknown primary  Significant Hospital Events: Including procedures, antibiotic start and stop dates in addition to other pertinent events   [facility]16 ex lap with frozen sections + for adenocarcinoma, awaiting final path 11/18 dx with PE started on heparin 11/19 negative LE dopplers 11/26 aspiration event  11/27 transferred to ICU  Interim History / Subjective:  As above  Objective   Blood pressure (!) 76/44, pulse (!) 142, temperature 98.1 F (36.7 C), temperature source Oral, resp. rate (!) 48, height '5\' 11"'$  (1.803 m), weight 80.8 kg, SpO2 93 %.    FiO2 (%):  [100 %] 100 %   Intake/Output Summary (Last 24 hours) at 07/03/2022 0234 Last data filed at 07/03/2022 0202 Gross per 24 hour  Intake 2880.65 ml  Output 1000 ml  Net 1880.65 ml   Filed Weights   06/27/2022 1132 07/01/22 0500 07/02/22 0700  Weight: 78  kg 78.5 kg 80.8 kg    Examination: General: tachypneic reclining in med, aaox3 HENT: ncat, eomi, mmmp Lungs: coarse rhonchi diffusely Cardiovascular: tachy, reg rhythm Abdomen: distended with fluid wave, mildly ttp, bs absent Extremities: no c/c/e Neuro: moves all 4 extremities equally GU: deferred  Resolved Hospital Problem list     Assessment & Plan:  Acute hypoxic resp  failure PE, acute Aspiration event Aspiration pneumonia Sepsis 2/2 above -resume abx with zosyn for now -titrate oxygen, may ultimately req intubation as pt is NOT a NIV candidate with the state of his bowels and gastric contents.  -control nausea -cont heparin gtt for now.   Peritoneal carcinomatosis:  Ascites, malignant in nature ileus -noted, may warrant peritoneal drain in meantime to decrease need for para's as we determine treatment plan -family would like to seek therapies if able to [place] -cont tpn  Arf:  -cr jumped today -suspect this is multifactorial from poor ability to have po intake -perhaps ascites and carcinoma compression but no hydro noted only fluid around kidney  H/o htn now with shock:  -will get echo at this point (should have it for Pe's anyway) but to determine cardiac function as we attempt to resuscitate pt -cont with ivf for now as oxygenation tolerated -start neo via picc with severe tachycardia  Lactic acidosis:  -trend, cont volume      Best Practice (right click and "Reselect all SmartList Selections" daily)   Diet/type: TPN DVT prophylaxis: systemic heparin GI prophylaxis: N/A Lines: Central line Foley:  Yes, and it is still needed Code Status:  full code Last date of multidisciplinary goals of care discussion [11/27 with pt]  Labs   CBC: Recent Labs  Lab 06/26/22 0501 06/27/22 0433 06/28/22 0043 06/29/22 0304 06/30/22 0508 07/01/22 0410 07/02/22 0302 07/02/22 1918  WBC 24.7* 25.4*   < > 37.7* 43.4* 42.3* 41.3* 38.5*  NEUTROABS 22.0* 22.6*  --   --   --   --  37.2* 34.7*  HGB 12.1* 12.7*   < > 13.3 12.1* 12.3* 11.9* 11.5*  HCT 38.0* 40.1   < > 42.1 38.6* 38.5* 38.2* 35.8*  MCV 94.3 94.8   < > 94.8 96.0 94.6 95.5 94.2  PLT 558* 648*   < > 626* 588* 438* 384 352   < > = values in this interval not displayed.    Basic Metabolic Panel: Recent Labs  Lab 06/27/22 0433 06/28/22 0043 06/29/22 0304  06/30/22 0508 07/02/22 0302 07/02/22 1918  NA 145 147* 143 143 142 140  K 3.3* 3.7 3.9 3.8 3.7 3.9  CL 107 108 107 108 107 105  CO2 '29 31 28 29 25 25  '$ GLUCOSE 122* 156* 149* 156* 131* 165*  BUN '20 21 23 '$ 33* 34* 52*  CREATININE 0.85 0.82 0.88 0.91 0.80 1.86*  CALCIUM 8.3* 8.6* 8.6* 8.7* 8.2* 8.0*  MG 2.5* 2.5* 2.4 2.3 1.9  --   PHOS  --  2.7 2.8 3.2 3.6  --    GFR: Estimated Creatinine Clearance: 39.9 mL/min (A) (by C-G formula based on SCr of 1.86 mg/dL (H)). Recent Labs  Lab 06/30/22 0508 07/01/22 0410 07/02/22 0302 07/02/22 1918 07/02/22 2222  WBC 43.4* 42.3* 41.3* 38.5*  --   LATICACIDVEN  --   --   --  2.6* 2.5*    Liver Function Tests: Recent Labs  Lab 06/27/22 0433 06/28/22 0043 06/29/22 0304 07/02/22 0302 07/02/22 1918  AST '30 26 25 '$ 51* 40  ALT 65* 48*  34 48* 43  ALKPHOS 143* 141* 124 139* 125  BILITOT 1.3* 1.2 1.1 1.7* 1.6*  PROT 6.6 6.8 7.0 5.9* 5.5*  ALBUMIN 2.2* 2.4* 2.3* 1.8* 1.8*   No results for input(s): "LIPASE", "AMYLASE" in the last 168 hours. Recent Labs  Lab 07/02/22 1918  AMMONIA 36*    ABG    Component Value Date/Time   HCO3 30.4 (H) 07/02/2022 1918   O2SAT 40 07/02/2022 1918     Coagulation Profile: No results for input(s): "INR", "PROTIME" in the last 168 hours.  Cardiac Enzymes: No results for input(s): "CKTOTAL", "CKMB", "CKMBINDEX", "TROPONINI" in the last 168 hours.  HbA1C: Hgb A1c MFr Bld  Date/Time Value Ref Range Status  06/27/2022 04:33 AM 4.8 4.8 - 5.6 % Final    Comment:    (NOTE) Pre diabetes:          5.7%-6.4%  Diabetes:              >6.4%  Glycemic control for   <7.0% adults with diabetes     CBG: Recent Labs  Lab 07/02/22 0726 07/02/22 1149 07/02/22 1544 07/02/22 2001 07/03/22 0000  GLUCAP 156* 149* 167* 165* 164*    Review of Systems:   As above  Past Medical History:  He,  has a past medical history of G6PD deficiency, Hyperlipidemia, Hypertension, and UTI (urinary tract infection).    Surgical History:   Past Surgical History:  Procedure Laterality Date   LAPAROSCOPY N/A 06/18/2022   Procedure: LAPAROSCOPY DIAGNOSTIC;  Surgeon: Coralie Keens, MD;  Location: [facility]S;  Service: General;  Laterality: N/A;  with Biopsy of peritoneal nodules   URETHRA SURGERY     went in clean make sure urethra is clear     Social History:   reports that he has never smoked. He has never used smokeless tobacco. He reports that he does not drink alcohol and does not use drugs.   Family History:  His family history includes Cancer in his brother, maternal aunt, and mother.   Allergies No Known Allergies   Home Medications  Prior to Admission medications   Medication Sig Start Date End Date Taking? Authorizing Provider  amLODipine-benazepril (LOTREL) 10-20 MG capsule Take 1 capsule by mouth daily. 09/08/21  Yes Nche, Charlene Brooke, NP  atorvastatin (LIPITOR) 40 MG tablet TAKE 1 TABLET DAILY AT 6PM Patient taking differently: Take 40 mg by mouth in the morning. 09/08/21  Yes Nche, Charlene Brooke, NP  ibuprofen (ADVIL) 200 MG tablet Take 400 mg by mouth every 6 (six) hours as needed for mild pain or headache.   Yes [provider]  LUMIFY 0.025 % SOLN Place 1 drop into both eyes every 8 (eight) hours as needed (for redness).   Yes [provider]  oxyCODONE-acetaminophen (PERCOCET/ROXICET) 5-325 MG tablet Take 1 tablet by mouth every 6 (six) hours as needed for severe pain.   Yes [provider]  tamsulosin (FLOMAX) 0.4 MG CAPS capsule Take 0.4 mg by mouth daily after breakfast. 06/05/17  Yes [provider]  sildenafil (REVATIO) 20 MG tablet Take 60-100 mg by mouth daily as needed (as directed). 06/07/17   [provider]     Critical care time: 43 min from 0130-0320

## 2022-07-03 NOTE — TOC Initial Note (Signed)
Transition of Care [facility]) - Initial/Assessment Note    Patient Details  Name: Cameron Rice MRN: [id number] Date of Birth: February 06, 1951  Transition of Care [facility]) CM/SW Contact:    Leeroy Cha, RN Phone Number: 07/03/2022, 7:37 AM  Clinical Narrative:                 This rn fiurst day with patient toc assessment done. [id number]/ chart reviewed.  Following for toc needs.  Plan is to return home with self-care currently.  Expected Discharge Plan: Home/Self Care Barriers to Discharge: Continued Medical Work up   Patient Goals and CMS Choice Patient states their goals for this hospitalization and ongoing recovery are:: unable to state CMS Medicare.gov Compare Post Acute Care list provided to:: Patient Choice offered to / list presented to : Patient  Expected Discharge Plan and Services Expected Discharge Plan: Home/Self Care   Discharge Planning Services: CM Consult   Living arrangements for the past 2 months: Single Family Home                                      Prior Living Arrangements/Services Living arrangements for the past 2 months: [place] Lives with:: Spouse                   Activities of Daily Living Home Assistive Devices/Equipment: None ADL Screening (condition at time of admission) Patient's cognitive ability adequate to safely complete daily activities?: Yes Is the patient deaf or have difficulty hearing?: No Does the patient have difficulty seeing, even when wearing glasses/contacts?: No Does the patient have difficulty concentrating, remembering, or making decisions?: No Patient able to express need for assistance with ADLs?: Yes Does the patient have difficulty dressing or bathing?: No Independently performs ADLs?: Yes (appropriate for developmental age) Does the patient have difficulty walking or climbing stairs?: No Weakness of Legs: None Weakness of Arms/Hands: None  Permission Sought/Granted                   Emotional Assessment   Attitude/Demeanor/Rapport: Apprehensive Affect (typically observed): Constricted Orientation: : Oriented to Self, Oriented to Place, Oriented to  Time, Oriented to Situation Alcohol / Substance Use: Not Applicable Psych Involvement: No (comment)  Admission diagnosis:  Ileus ([place]) [K56.7] SBO (small bowel obstruction) ([place]) [K56.609] Elevated liver enzymes [R74.8] Other ascites [R18.8] Patient Active Problem List   Diagnosis Date Noted   Acute hypoxic respiratory failure ([place]) 07/03/2022   Peritoneal carcinomatosis ([place]) 06/30/2022   Metastatic adenocarcinoma ([place]) 06/30/2022   Postoperative ileus ([place]) 06/30/2022   Malnutrition of moderate degree 06/28/2022   SBO (small bowel obstruction) ([place]) 06/25/2022   LFT elevation 07/04/2022   Urinary frequency 07/04/2021   Generalized abdominal pain 07/04/2021   Chronic right-sided low back pain 12/22/2020   Hx of colonic polyp 12/10/2018   AC joint arthropathy 11/26/2018   Need for prophylactic vaccination against Streptococcus pneumoniae (pneumococcus) and influenza 79/48/0165   Complicated UTI (urinary tract infection) 08/29/2017   Erectile disorder due to medical condition in male 08/29/2017   Shift work sleep disorder 09/04/2013   Essential hypertension 09/06/2011   BPH (benign prostatic hyperplasia) 09/06/2011   Pure hypercholesterolemia 09/06/2011   Hypogonadism male 09/06/2011   PCP:  Flossie Buffy, NP Pharmacy:   [facility] [address] [place], [place]. AT [place] [phone] [address]. [place] [address] Phone: [phone] Fax: [phone]     Social Determinants of Health (SDOH) Interventions    Readmission Risk Interventions   No data to display

## 2022-07-03 NOTE — Progress Notes (Addendum)
ICU Consult    NAME:  Cameron Rice, MRN:  [id number], DOB:  10/01/1950, LOS: 12 ADMISSION DATE:  06/16/2022, CONSULTATION DATE:  07/03/22 REFERRING NP: Clarene Essex    CHIEF COMPLAINT:  Consult for increasing tachypnea,tachycardia, abdominal distention and (occurred vomiting)   Concern for aspiration and respiratory distress/failure.   Brief History    71 year old male patient for medical history hypertension, BPH, HLD, recent umbilical hernia repair on 06/08/2025.  Originally presented with abdominal distention since hernia repair and nausea and vomiting for 2 days. CT abdomen pelvis concerning for metastatic disease and small bowel obstruction. Diagnostic laparoscopy with biopsy 06/09/2022, to have peritoneal carcinomatosis.  CT chest/abdomen 06/14/2022 showed PE within the proximal superior lingual artery suggesting degree of chronicity and possible right common iliac vein DVT. Patient is on heparin. Postop ileus requiring NG tube decompression and TPN. Underwent paracentesis with removal of 4.2 L on 06/30/2022  History of present illness   Notified by RN for concern over tachypnea, tachycardia and increasing abdominal distention since/on reception to Progressive unit.  Patient has been tachycardic apparently into the day pre-shift 07/02/22. Bedside visit patient found to be tachycardic in the 150, tachypneic from 35 to 50 breaths/min. Blood pressure was in the 818 systolic.  Stomach was firm round and taught.  Patient expressed some nausea at that time. Decision to reinsert NG tube that had been removed from morning (of 11/26) During NG tube patient vomited a moderate amount of brown emesis. Patient was able to clear airway, however, he did have retained contents in his mouth that required suction. Patient required increased oxygen during the evening andduring the event.  Transfer to ICU.  X-rays of  chest/abdomen pending  Past Medical History   Essential hypertension, hyperlipidemia, recurrent UTI, BPH, history of ureteral surgery. Parent umbilical hernia repair on June 08, 2022.   Significant Hospital Events   Tachycardia, tachypnea, concern for aspiration.  Consults:  CCM, Dr. Ruthann Cancer  Procedures:  Chest x-ray, abdominal x-ray, NG tube fluid bolus  Significant Diagnostic Tests:   Chest x-ray  FINDINGS: Cardiac shadow is stable. Left PICC is noted in satisfactory position. Gastric catheter is noted within the stomach. Bibasilar atelectatic changes are noted slightly increased when compared with the prior exam. These changes are similar to that seen on prior CT examination and could be related to aspiration.   IMPRESSION: Bibasilar changes which may be related to aspiration. No other focal abnormality is noted.     Electronically Signed   By: Inez Catalina M.D.   On: 07/03/2022 02:51    ============================================================   Abdominal x-ray  "FINDINGS: Gastric catheter is coiled within the stomach. Scattered contrast is noted throughout the colon from prior CT examination. No free air is noted. Contrast is seen within the bladder from recent contrast administration.   IMPRESSION: Gastric catheter coiled within the stomach.     Electronically Signed   By: Inez Catalina M.D.   On: 07/03/2022 02:50 "   Antimicrobials:   Zosyn per pharmacy consult.   Objective   Blood pressure (!) 76/44, pulse (!) 142, temperature 98.1  F (36.7 C), temperature source Oral, resp. rate (!) 48, height '5\' 11"'$  (1.803 m), weight 80.8 kg, SpO2 93 %.    FiO2 (%):  [100 %] 100 %   Intake/Output Summary (Last 24 hours) at 07/03/2022 0250 Last data filed at 07/03/2022 0202 Gross per 24 hour  Intake 2880.65 ml  Output 1000 ml  Net 1880.65 ml   Filed Weights   06/13/2022 1132 07/01/22 0500 07/02/22 0700  Weight: 78 kg 78.5 kg 80.8 kg     Resolved Hospital Problem list   NGT insertion with immediate return of brown fluid - X ray verification above. Others ongoing   Assessment & Plan:   Transfer to ICU IV fluid bolus Reinsert NG tube-completed Chest x-ray Abdominal x-ray Antibiotics Consult CCM-  Pressor   Best practice:  Diet: NPO  Pain/Anxiety/Delirium protocol  VAP protocol  DVT prophylaxis: Heparin drip ongoing Code Status: FULL Code Family Communication: immediately available, aware   Disposition: ICU (CCM)   Labs   CBC: Recent Labs  Lab 06/26/22 0501 06/27/22 0433 06/28/22 0043 06/29/22 0304 06/30/22 0508 07/01/22 0410 07/02/22 0302 07/02/22 1918  WBC 24.7* 25.4*   < > 37.7* 43.4* 42.3* 41.3* 38.5*  NEUTROABS 22.0* 22.6*  --   --   --   --  37.2* 34.7*  HGB 12.1* 12.7*   < > 13.3 12.1* 12.3* 11.9* 11.5*  HCT 38.0* 40.1   < > 42.1 38.6* 38.5* 38.2* 35.8*  MCV 94.3 94.8   < > 94.8 96.0 94.6 95.5 94.2  PLT 558* 648*   < > 626* 588* 438* 384 352   < > = values in this interval not displayed.    Basic Metabolic Panel: Recent Labs  Lab 06/27/22 0433 06/28/22 0043 06/29/22 0304 06/30/22 0508 07/02/22 0302 07/02/22 1918  NA 145 147* 143 143 142 140  K 3.3* 3.7 3.9 3.8 3.7 3.9  CL 107 108 107 108 107 105  CO2 '29 31 28 29 25 25  '$ GLUCOSE 122* 156* 149* 156* 131* 165*  BUN '20 21 23 '$ 33* 34* 52*  CREATININE 0.85 0.82 0.88 0.91 0.80 1.86*  CALCIUM 8.3* 8.6* 8.6* 8.7* 8.2* 8.0*  MG 2.5* 2.5* 2.4 2.3 1.9  --   PHOS  --  2.7 2.8 3.2 3.6  --    GFR: Estimated Creatinine Clearance: 39.9 mL/min (A) (by C-G formula based on SCr of 1.86 mg/dL (H)). Recent Labs  Lab 06/30/22 0508 07/01/22 0410 07/02/22 0302 07/02/22 1918 07/02/22 2222  WBC 43.4* 42.3* 41.3* 38.5*  --   LATICACIDVEN  --   --   --  2.6* 2.5*    Liver Function Tests: Recent Labs  Lab 06/27/22 0433 06/28/22 0043 06/29/22 0304 07/02/22 0302 07/02/22 1918  AST '30 26 25 '$ 51* 40  ALT 65* 48* 34 48* 43  ALKPHOS 143*  141* 124 139* 125  BILITOT 1.3* 1.2 1.1 1.7* 1.6*  PROT 6.6 6.8 7.0 5.9* 5.5*  ALBUMIN 2.2* 2.4* 2.3* 1.8* 1.8*   No results for input(s): "LIPASE", "AMYLASE" in the last 168 hours. Recent Labs  Lab 07/02/22 1918  AMMONIA 36*    ABG    Component Value Date/Time   HCO3 30.4 (H) 07/02/2022 1918   O2SAT 40 07/02/2022 1918     Coagulation Profile: No results for input(s): "INR", "PROTIME" in the last 168 hours.  Cardiac Enzymes: No results for input(s): "CKTOTAL", "CKMB", "CKMBINDEX", "TROPONINI" in the last 168 hours.  HbA1C: Hgb A1c MFr Bld  Date/Time Value Ref  Range Status  06/27/2022 04:33 AM 4.8 4.8 - 5.6 % Final    Comment:    (NOTE) Pre diabetes:          5.7%-6.4%  Diabetes:              >6.4%  Glycemic control for   <7.0% adults with diabetes     CBG: Recent Labs  Lab 07/02/22 0726 07/02/22 1149 07/02/22 1544 07/02/22 2001 07/03/22 0000  GLUCAP 156* 149* 167* 165* 164*     Past Medical History  He  has a past medical history of G6PD deficiency, Hyperlipidemia, Hypertension, and UTI (urinary tract infection).   Surgical History    Past Surgical History:  Procedure Laterality Date   LAPAROSCOPY N/A 06/26/2022   Procedure: LAPAROSCOPY DIAGNOSTIC;  Surgeon: Coralie Keens, MD;  Location: [facility];  Service: General;  Laterality: N/A;  with Biopsy of peritoneal nodules   URETHRA SURGERY     went in clean make sure urethra is clear     Social History   reports that he has never smoked. He has never used smokeless tobacco. He reports that he does not drink alcohol and does not use drugs.   Family History   His family history includes Cancer in his brother, maternal aunt, and mother.   Allergies No Known Allergies   Home Medications  Prior to Admission medications   Medication Sig Start Date End Date Taking? Authorizing Provider  amLODipine-benazepril (LOTREL) 10-20 MG capsule Take 1 capsule by mouth daily. 09/08/21  Yes Nche, Charlene Brooke, NP   atorvastatin (LIPITOR) 40 MG tablet TAKE 1 TABLET DAILY AT 6PM Patient taking differently: Take 40 mg by mouth in the morning. 09/08/21  Yes Nche, Charlene Brooke, NP  ibuprofen (ADVIL) 200 MG tablet Take 400 mg by mouth every 6 (six) hours as needed for mild pain or headache.   Yes [provider]  LUMIFY 0.025 % SOLN Place 1 drop into both eyes every 8 (eight) hours as needed (for redness).   Yes [provider]  oxyCODONE-acetaminophen (PERCOCET/ROXICET) 5-325 MG tablet Take 1 tablet by mouth every 6 (six) hours as needed for severe pain.   Yes [provider]  tamsulosin (FLOMAX) 0.4 MG CAPS capsule Take 0.4 mg by mouth daily after breakfast. 06/05/17  Yes [provider]  sildenafil (REVATIO) 20 MG tablet Take 60-100 mg by mouth daily as needed (as directed). 06/07/17   [provider]       Gershon Cull MSNA MSN [place]

## 2022-07-03 NOTE — Progress Notes (Signed)
Pharmacy Antibiotic Note  Cameron Rice is a 71 y.o. male admitted on 06/20/2022.  Pharmacy was consulted on 11/18 to dose heparin IV for new PE on CT (and suspicious for right common iliac DVT). Patient with history of umbilical hernia repair on 11/2 and currently with SBO.  This evening patient vomited when NG tube placement was attempted.  Concern for aspiration and Pharmacy has been consulted for Zosyn dosing.  Plan: Zosyn 3.375g IV q8h (4 hour infusion). Follow renal function  Height: '5\' 11"'$  (180.3 cm) Weight: 80.8 kg (178 lb 2.1 oz) IBW/kg (Calculated) : 75.3  Temp (24hrs), Avg:98.1 F (36.7 C), Min:97.9 F (36.6 C), Max:98.5 F (36.9 C)  Recent Labs  Lab 06/28/22 0043 06/29/22 0304 06/30/22 0508 07/01/22 0410 07/02/22 0302 07/02/22 1918 07/02/22 2222  WBC 31.2* 37.7* 43.4* 42.3* 41.3* 38.5*  --   CREATININE 0.82 0.88 0.91  --  0.80 1.86*  --   LATICACIDVEN  --   --   --   --   --  2.6* 2.5*    Estimated Creatinine Clearance: 39.9 mL/min (A) (by C-G formula based on SCr of 1.86 mg/dL (H)).    No Known Allergies  Antimicrobials this admission: 11/27 Zosyn >>       Microbiology results: 11/26 BCx:   11/24 Peritoneal:   11/16 MRSA PCR: negative  Thank you for allowing pharmacy to be a part of this patient's care.  Everette Rank, PharmD 07/03/2022 2:23 AM

## 2022-07-03 NOTE — Progress Notes (Signed)
Pharmacy - IV heparin  Assessment:    Please see note from Leodis Sias, PharmD earlier today for full details.  Briefly, 71 y.o. male on IV heparin for new PE  Heparin level therapeutic but borderline low at 0.3 units/ml after reduction in infusion rate from 1900 to 1700 units/hr No bleeding issues per RN Daytime RN noted pump paused around noon today to give another infusion, but was not sure for how long SCr significantly improved from earlier this morning  Plan:  Continue heparin at 1700 units/hr, suspect mid-day pause may have caused level to be falsely low Recheck confirmatory heparin level in 6 hr  Cameron Rice, PharmD, BCPS [phone] 07/03/2022, 7:33 PM

## 2022-07-03 NOTE — Progress Notes (Signed)
PT Cancellation Note  Patient Details Name: Cameron Rice MRN: [id number] DOB: 1950-12-27   Cancelled Treatment:    Reason Eval/Treat Not Completed: Medical issues which prohibited therapy, transferred to ICU. [place] Office [phone] Weekend [phone]    Claretha Cooper 07/03/2022, 7:39 AM

## 2022-07-03 NOTE — Progress Notes (Signed)
Family would like patient transferred to [facility] for cancer treatment once recovers more.  Erskine Emery MD

## 2022-07-03 NOTE — Progress Notes (Addendum)
11 Days Post-Op   Subjective/Chief Complaint: Reports 4-5 episodes of emesis yesterday. Says he is having flatus but is not really having BMs, last got OOB yesterday and went for a walk.   Transferred to ICU yesterday afternoon for aspiration (CTA chest w/ debris in R bronchus and mucous plugging of RLL), tachycardia, hypotension, acute renal failure.  He is on a small amt of phenylephrine this morning.   Objective: Vital signs in last 24 hours: Temp:  [97.9 F (36.6 C)-98.5 F (36.9 C)] 98 F (36.7 C) (11/27 0400) Pulse Rate:  [135-150] 141 (11/27 0715) Resp:  [16-56] 36 (11/27 0715) BP: (70-203)/(42-121) 87/60 (11/27 0715) SpO2:  [66 %-100 %] 95 % (11/27 0715) FiO2 (%):  [100 %] 100 % (11/27 0126) Last BM Date : 07/01/22  Intake/Output from previous day: 11/26 0701 - 11/27 0700 In: 4116.7 [P.O.:210; I.V.:3241.6; IV Piggyback:665.1] Out: 700 [Urine:700] Intake/Output this shift: Total I/O In: 191.2 [I.V.:185.1; IV Piggyback:6.1] Out: -   Gen: alert, ill appearing, no acute distress Pulm: normal effort on high flow [place] Abd: soft, distended - but less distended compared to Wednesday of last week when I saw him, NG in R nare with clear/brown effluent.  GU: external cath in place draining dark yellow urine Ext: no edema  Lab Results:  Recent Labs    07/02/22 1918 07/03/22 0500  WBC 38.5* 33.7*  HGB 11.5* 10.5*  HCT 35.8* 32.5*  PLT 352 333   BMET Recent Labs    07/02/22 1918 07/03/22 0500  NA 140 140  K 3.9 3.9  CL 105 104  CO2 25 25  GLUCOSE 165* 144*  BUN 52* 71*  CREATININE 1.86* 2.22*  CALCIUM 8.0* 8.1*   PT/INR No results for input(s): "LABPROT", "INR" in the last 72 hours. ABG Recent Labs    07/02/22 1918  HCO3 30.4*    Studies/Results: DG CHEST PORT 1 VIEW  Result Date: 07/03/2022 CLINICAL DATA:  Possible aspiration following vomiting, initial encounter EXAM: PORTABLE CHEST 1 VIEW COMPARISON:  07/02/2022 FINDINGS: Cardiac shadow is stable.  Left PICC is noted in satisfactory position. Gastric catheter is noted within the stomach. Bibasilar atelectatic changes are noted slightly increased when compared with the prior exam. These changes are similar to that seen on prior CT examination and could be related to aspiration. IMPRESSION: Bibasilar changes which may be related to aspiration. No other focal abnormality is noted. Electronically Signed   By: Inez Catalina M.D.   On: 07/03/2022 02:51   DG Abd Portable 1V  Result Date: 07/03/2022 CLINICAL DATA:  Check gastric catheter placement EXAM: PORTABLE ABDOMEN - 1 VIEW COMPARISON:  None Available. FINDINGS: Gastric catheter is coiled within the stomach. Scattered contrast is noted throughout the colon from prior CT examination. No free air is noted. Contrast is seen within the bladder from recent contrast administration. IMPRESSION: Gastric catheter coiled within the stomach. Electronically Signed   By: Inez Catalina M.D.   On: 07/03/2022 02:50   CT Angio Chest Pulmonary Embolism (PE) W or WO Contrast  Result Date: 07/02/2022 CLINICAL DATA:  Pulmonary embolism suspected, high probability. Tachycardia, shortness of breath. EXAM: CT ANGIOGRAPHY CHEST WITH CONTRAST TECHNIQUE: Multidetector CT imaging of the chest was performed using the standard protocol during bolus administration of intravenous contrast. Multiplanar CT image reconstructions and MIPs were obtained to evaluate the vascular anatomy. RADIATION DOSE REDUCTION: This exam was performed according to the departmental dose-optimization program which includes automated exposure control, adjustment of the mA and/or kV according to  patient size and/or use of iterative reconstruction technique. CONTRAST:  56m OMNIPAQUE IOHEXOL 350 MG/ML SOLN COMPARISON:  06/24/2022. FINDINGS: Cardiovascular: The heart is normal in size a few scattered coronary artery calcifications are noted. There is a trace pericardial effusion. There is mild atherosclerotic  calcification of the aorta without evidence of aneurysm. Small residual thrombus in the left upper lobe. No new thrombus is identified. Evaluation is limited due to respiratory motion artifact. Mediastinum/Nodes: No mediastinal, hilar, or axillary lymphadenopathy. The thyroid gland is within normal limits. Debris is present in the trachea. The esophagus is distended with fluid. Lungs/Pleura: Debris is present in the right mainstem bronchus. There is bronchial wall thickening bilaterally with mucous plugging in the right lower lobe. Patchy airspace disease is noted in the right upper and middle lobes and lower lobes bilaterally. There is consolidation at the right lung base. No effusion or pneumothorax. An azygous lobe is noted. Upper Abdomen: Large ascites in the upper abdomen. Musculoskeletal: Degenerative changes in the thoracic spine. No acute osseous abnormality. Review of the MIP images confirms the above findings. IMPRESSION: 1. Left upper lobe pulmonary embolism, not significantly changed from the prior exam. No new lesion is identified. 2. Bronchial wall thickening bilaterally with mucous plugging in the right lower lobe. Scattered airspace opacities in the lungs bilaterally with consolidation at the right lung base, concerning for pneumonia. 3. Distended fluid-filled esophagus. Aspiration pneumonia should be included in the differential diagnosis. 4. Aortic atherosclerosis and coronary artery calcifications. 5. Large ascites. Electronically Signed   By: LBrett FairyM.D.   On: 07/02/2022 21:10   DG Chest Port 1 View  Result Date: 07/02/2022 CLINICAL DATA:  Lethargy with tachypnea and tachycardia. EXAM: PORTABLE CHEST 1 VIEW COMPARISON:  June 28, 2022 FINDINGS: There is stable left-sided PICC line positioning. The heart size and mediastinal contours are within normal limits. Low lung volumes are noted. Mild atelectasis is seen within the bilateral lung bases. There is no evidence of a pleural  effusion or pneumothorax. Multilevel degenerative changes seen throughout the thoracic spine. IMPRESSION: Low lung volumes with mild bibasilar atelectasis. Electronically Signed   By: TVirgina NorfolkM.D.   On: 07/02/2022 19:14    Anti-infectives: Anti-infectives (From admission, onward)    Start     Dose/Rate Route Frequency Ordered Stop   07/03/22 0300  piperacillin-tazobactam (ZOSYN) IVPB 3.375 g        3.375 g 12.5 mL/hr over 240 Minutes Intravenous Every 8 hours 07/03/22 0222     06/10/2022 0200  piperacillin-tazobactam (ZOSYN) IVPB 3.375 g  Status:  Discontinued       See Hyperspace for full Linked Orders Report.   3.375 g 12.5 mL/hr over 240 Minutes Intravenous Every 8 hours 06/20/2022 1849 06/27/22 1232   06/30/2022 1900  piperacillin-tazobactam (ZOSYN) IVPB 3.375 g       See Hyperspace for full Linked Orders Report.   3.375 g 100 mL/hr over 30 Minutes Intravenous  Once 06/07/2022 1849 07/04/2022 1940       Assessment/Plan: Post operative abdominal pain with ascites after umbilical hernia repair with mesh by Dr. RRosendo Gros11/2/23  -POD# 11 s/p LAPAROSCOPY DIAGNOSTIC, BIOPSY OF OMENTAL AND PERITONEAL NODULES, ASPIRATION OF ASCITES FOR CYTOLOGY 11/16 Dr. BNinfa Linden- pathology and cytology resulted - moderately differentiated adenocarcinoma - CT C/A/P 11/18 showed chronic PE and right common iliac DVT - now on heparin gtt with no sign of bleeding - CT abdomen showed evidence of peritoneal carcinomatosis but no obvious sign of primary tumor.  No  pulmonary tumor or mets noted. Repeat ct scan shows no real change  - NG removed yesterday followed by emesis and aspiration. NG was replaced. Pt having flatus and ?some small BMs. continue NG to LIWS today and monitor. If he continues to have high NG output with poor PO tolerance then he may ultimately meed a venting gastrostomy tube.   - Appreciate CCM mgmt of acute respiratory failure, aspiration PNA, and acute renal failure.  - Mobilize once  medically stable to do so     ID - wbc 33.7 (from 38), elevated but stable; Zosyn 11/15-11/21, Zosyn 11/27 >>  FEN - NPO, NG LIWS, TPN VTE - hep gtt Foley - d/c 11/17, voiding  HTN HLD BPH Hx UTI    Jill Alexanders 07/03/2022

## 2022-07-03 NOTE — Progress Notes (Signed)
ANTICOAGULATION CONSULT NOTE - Follow Up Consult  Pharmacy Consult for Heparin Indication: pulmonary embolus  No Known Allergies  Patient Measurements: Height: '5\' 11"'$  (180.3 cm) Weight: 80.8 kg (178 lb 2.1 oz) IBW/kg (Calculated) : 75.3 Heparin Dosing Weight:  80.8 kg  Vital Signs: Temp: 98 F (36.7 C) (11/27 0400) Temp Source: Axillary (11/27 0400) BP: 87/60 (11/27 0715) Pulse Rate: 141 (11/27 0715)  Labs: Recent Labs    07/02/22 0302 07/02/22 1330 07/02/22 1918 07/02/22 2222 07/03/22 0500  HGB 11.9*  --  11.5*  --  10.5*  HCT 38.2*  --  35.8*  --  32.5*  PLT 384  --  352  --  333  HEPARINUNFRC 0.14* 0.34 0.53  --  0.81*  CREATININE 0.80  --  1.86*  --  2.22*  TROPONINIHS  --   --  37* 43*  --      Estimated Creatinine Clearance: 33.4 mL/min (A) (by C-G formula based on SCr of 2.22 mg/dL (H)).  Brief Assessment: 46 yoM admitted on 11/15.  Pharmacy was consulted on 11/18 to dose heparin IV for new PE on CT (and suspicious for right common iliac DVT). Dopplers negative for DVT. 07/03/2022 Heparin level 0.81 - above goal on  heparin drip at 1900 units/hr Hg 10.5 - slightly down from 11.5; PLT WNL. Scr up to 2.22 No bleeding reported  Goal of Therapy:  Heparin level 0.3-0.7 units/ml Monitor platelets by anticoagulation protocol: Yes   Plan:  decrease heparin IV infusion to 1700 units/hr and check 8 hour heparin level Daily heparin level and CBC  Eudelia Bunch, Pharm.D Use secure chat for questions 07/03/2022 8:21 AM

## 2022-07-03 NOTE — Progress Notes (Signed)
07/03/2022  Seen for mental status change.  Was napping then woke up a bit confused.  Neurological exam nonfocal (CN2-12 intact symmetric, all 4 ext move to command with good strength, sensation intact throughout), does have some word finding difficulty but nods head appropriately to questions.  Admits anxiety.  Speech is fluent.  Peritoneal fluid c/w infection.  Most c/w ICU vs. septic delirium  - check ABG, CBG - head CT given on heparin gtt although suspect low yield; can also see what's going on in his stomach  Erskine Emery MD PCCM

## 2022-07-04 ENCOUNTER — Inpatient Hospital Stay (HOSPITAL_COMMUNITY): Payer: BC Managed Care – PPO

## 2022-07-04 DIAGNOSIS — Z66 Do not resuscitate: Secondary | ICD-10-CM | POA: Diagnosis not present

## 2022-07-04 DIAGNOSIS — K56609 Unspecified intestinal obstruction, unspecified as to partial versus complete obstruction: Secondary | ICD-10-CM | POA: Diagnosis not present

## 2022-07-04 DIAGNOSIS — A419 Sepsis, unspecified organism: Secondary | ICD-10-CM | POA: Diagnosis not present

## 2022-07-04 DIAGNOSIS — Z515 Encounter for palliative care: Secondary | ICD-10-CM | POA: Diagnosis not present

## 2022-07-04 DIAGNOSIS — K9131 Postprocedural partial intestinal obstruction: Secondary | ICD-10-CM | POA: Diagnosis not present

## 2022-07-04 LAB — CBC
HCT: 22.6 % — ABNORMAL LOW (ref 39.0–52.0)
HCT: 20.9 % — ABNORMAL LOW (ref 39.0–52.0)
Hemoglobin: 7.3 g/dL — ABNORMAL LOW (ref 13.0–17.0)
Hemoglobin: 6.9 g/dL — CL (ref 13.0–17.0)
MCH: 30.4 pg (ref 26.0–34.0)
MCH: 30.3 pg (ref 26.0–34.0)
MCHC: 32.3 g/dL (ref 30.0–36.0)
MCHC: 33 g/dL (ref 30.0–36.0)
MCV: 94.2 fL (ref 80.0–100.0)
MCV: 91.7 fL (ref 80.0–100.0)
Platelets: 246 10*3/uL (ref 150–400)
Platelets: 218 10*3/uL (ref 150–400)
RBC: 2.4 MIL/uL — ABNORMAL LOW (ref 4.22–5.81)
RBC: 2.28 MIL/uL — ABNORMAL LOW (ref 4.22–5.81)
RDW: 15.7 % — ABNORMAL HIGH (ref 11.5–15.5)
RDW: 18.7 % — ABNORMAL HIGH (ref 11.5–15.5)
WBC: 19.4 10*3/uL — ABNORMAL HIGH (ref 4.0–10.5)
WBC: 26.8 10*3/uL — ABNORMAL HIGH (ref 4.0–10.5)
nRBC: 6.3 % — ABNORMAL HIGH (ref 0.0–0.2)
nRBC: 16 % — ABNORMAL HIGH (ref 0.0–0.2)

## 2022-07-04 LAB — COMPREHENSIVE METABOLIC PANEL
ALT: 30 U/L (ref 0–44)
AST: 29 U/L (ref 15–41)
Albumin: 2.6 g/dL — ABNORMAL LOW (ref 3.5–5.0)
Alkaline Phosphatase: 79 U/L (ref 38–126)
Anion gap: 9 (ref 5–15)
BUN: 49 mg/dL — ABNORMAL HIGH (ref 8–23)
CO2: 30 mmol/L (ref 22–32)
Calcium: 8.1 mg/dL — ABNORMAL LOW (ref 8.9–10.3)
Chloride: 107 mmol/L (ref 98–111)
Creatinine, Ser: 1.01 mg/dL (ref 0.61–1.24)
GFR, Estimated: >60 mL/min (ref >60)
Glucose, Bld: 142 mg/dL — ABNORMAL HIGH (ref 70–99)
Potassium: 3.5 mmol/L (ref 3.5–5.1)
Sodium: 146 mmol/L — ABNORMAL HIGH (ref 135–145)
Total Bilirubin: 2.8 mg/dL — ABNORMAL HIGH (ref 0.3–1.2)
Total Protein: 5.6 g/dL — ABNORMAL LOW (ref 6.5–8.1)

## 2022-07-04 LAB — DIC (DISSEMINATED INTRAVASCULAR COAGULATION)PANEL
D-Dimer, Quant: 10.11 ug/mL-FEU — ABNORMAL HIGH (ref 0.00–0.50)
Fibrinogen: 601 mg/dL — ABNORMAL HIGH (ref 210–475)
INR: 1.4 — ABNORMAL HIGH (ref 0.8–1.2)
Platelets: 201 10*3/uL (ref 150–400)
Prothrombin Time: 16.7 seconds — ABNORMAL HIGH (ref 11.4–15.2)
Smear Review: NONE SEEN
aPTT: 35 seconds (ref 24–36)

## 2022-07-04 LAB — GLUCOSE, CAPILLARY
Glucose-Capillary: 150 mg/dL — ABNORMAL HIGH (ref 70–99)
Glucose-Capillary: 123 mg/dL — ABNORMAL HIGH (ref 70–99)
Glucose-Capillary: 100 mg/dL — ABNORMAL HIGH (ref 70–99)
Glucose-Capillary: 220 mg/dL — ABNORMAL HIGH (ref 70–99)
Glucose-Capillary: 304 mg/dL — ABNORMAL HIGH (ref 70–99)
Glucose-Capillary: 250 mg/dL — ABNORMAL HIGH (ref 70–99)

## 2022-07-04 LAB — BODY FLUID CULTURE W GRAM STAIN: Culture: NO GROWTH

## 2022-07-04 LAB — PREPARE RBC (CROSSMATCH)

## 2022-07-04 LAB — ABO/RH: ABO/RH(D): O NEG

## 2022-07-04 LAB — BLOOD GAS, ARTERIAL
Acid-Base Excess: 9.5 mmol/L — ABNORMAL HIGH (ref 0.0–2.0)
Bicarbonate: 33.5 mmol/L — ABNORMAL HIGH (ref 20.0–28.0)
O2 Saturation: 99.1 %
Patient temperature: 37
pCO2 arterial: 42 mmHg (ref 32–48)
pH, Arterial: 7.51 — ABNORMAL HIGH (ref 7.35–7.45)
pO2, Arterial: 86 mmHg (ref 83–108)

## 2022-07-04 LAB — TRIGLYCERIDES: Triglycerides: 321 mg/dL — ABNORMAL HIGH (ref <150)

## 2022-07-04 LAB — PHOSPHORUS: Phosphorus: 3.2 mg/dL (ref 2.5–4.6)

## 2022-07-04 LAB — LACTIC ACID, PLASMA
Lactic Acid, Venous: 2.1 mmol/L (ref 0.5–1.9)
Lactic Acid, Venous: 2.6 mmol/L (ref 0.5–1.9)
Lactic Acid, Venous: 3.7 mmol/L (ref 0.5–1.9)

## 2022-07-04 LAB — MAGNESIUM: Magnesium: 2.1 mg/dL (ref 1.7–2.4)

## 2022-07-04 LAB — HEPARIN LEVEL (UNFRACTIONATED): Heparin Unfractionated: 0.33 IU/mL (ref 0.30–0.70)

## 2022-07-04 MED ORDER — SODIUM CHLORIDE 0.9 % IV SOLN
1.0000 g | Freq: Three times a day (TID) | INTRAVENOUS | Status: DC
  Administered 2022-07-04 – 2022-07-08 (×12): 1 g via INTRAVENOUS
  Filled 2022-07-04 (×13): qty 20

## 2022-07-04 MED ORDER — TRAVASOL 10 % IV SOLN
INTRAVENOUS | Status: AC
  Filled 2022-07-04: qty 1081.2

## 2022-07-04 MED ORDER — FENTANYL 2500MCG IN NS 250ML (10MCG/ML) PREMIX INFUSION
INTRAVENOUS | Status: AC
  Administered 2022-07-04: 50 ug/h via INTRAVENOUS
  Filled 2022-07-04: qty 250

## 2022-07-04 MED ORDER — SODIUM BICARBONATE 8.4 % IV SOLN
100.0000 meq | Freq: Once | INTRAVENOUS | Status: AC
  Administered 2022-07-04: 100 meq via INTRAVENOUS
  Filled 2022-07-04: qty 100

## 2022-07-04 MED ORDER — FENTANYL CITRATE PF 50 MCG/ML IJ SOSY: 25.0000 ug | PREFILLED_SYRINGE | INTRAMUSCULAR | Status: DC | PRN

## 2022-07-04 MED ORDER — ACETAMINOPHEN 160 MG/5ML PO SOLN
650.0000 mg | Freq: Four times a day (QID) | ORAL | Status: DC | PRN
  Administered 2022-07-07: 650 mg
  Filled 2022-07-04: qty 20.3

## 2022-07-04 MED ORDER — ROCURONIUM BROMIDE 10 MG/ML (PF) SYRINGE
PREFILLED_SYRINGE | INTRAVENOUS | Status: AC
  Administered 2022-07-04: 50 mg
  Filled 2022-07-04: qty 10

## 2022-07-04 MED ORDER — SODIUM CHLORIDE 0.9% IV SOLUTION: Freq: Once | INTRAVENOUS | Status: DC

## 2022-07-04 MED ORDER — PANTOPRAZOLE SODIUM 40 MG IV SOLR
40.0000 mg | Freq: Two times a day (BID) | INTRAVENOUS | Status: DC
  Administered 2022-07-04 – 2022-07-13 (×20): 40 mg via INTRAVENOUS
  Filled 2022-07-04 (×21): qty 10

## 2022-07-04 MED ORDER — ORAL CARE MOUTH RINSE: 15.0000 mL | OROMUCOSAL | Status: DC | PRN

## 2022-07-04 MED ORDER — FAMOTIDINE 20 MG PO TABS
20.0000 mg | ORAL_TABLET | Freq: Two times a day (BID) | ORAL | Status: DC
  Filled 2022-07-04: qty 1

## 2022-07-04 MED ORDER — FENTANYL 2500MCG IN NS 250ML (10MCG/ML) PREMIX INFUSION
25.0000 ug/h | INTRAVENOUS | Status: DC
  Administered 2022-07-05 (×2): 125 ug/h via INTRAVENOUS
  Administered 2022-07-06: 150 ug/h via INTRAVENOUS
  Filled 2022-07-04 (×2): qty 250

## 2022-07-04 MED ORDER — PHENYLEPHRINE 80 MCG/ML (10ML) SYRINGE FOR IV PUSH (FOR BLOOD PRESSURE SUPPORT)
PREFILLED_SYRINGE | INTRAVENOUS | Status: AC
  Filled 2022-07-04: qty 10

## 2022-07-04 MED ORDER — PROPOFOL 1000 MG/100ML IV EMUL
INTRAVENOUS | Status: AC
  Administered 2022-07-04: 5 ug/kg/min via INTRAVENOUS
  Filled 2022-07-04: qty 100

## 2022-07-04 MED ORDER — FENTANYL CITRATE (PF) 100 MCG/2ML IJ SOLN
INTRAMUSCULAR | Status: AC
  Filled 2022-07-04: qty 2

## 2022-07-04 MED ORDER — ORAL CARE MOUTH RINSE
15.0000 mL | OROMUCOSAL | Status: DC
  Administered 2022-07-04 – 2022-07-14 (×107): 15 mL via OROMUCOSAL

## 2022-07-04 MED ORDER — KETAMINE HCL 50 MG/5ML IJ SOSY
PREFILLED_SYRINGE | INTRAMUSCULAR | Status: AC
  Filled 2022-07-04: qty 10

## 2022-07-04 MED ORDER — FENTANYL BOLUS VIA INFUSION
25.0000 ug | INTRAVENOUS | Status: DC | PRN
  Administered 2022-07-04: 50 ug via INTRAVENOUS
  Administered 2022-07-05: 100 ug via INTRAVENOUS
  Administered 2022-07-05 (×2): 50 ug via INTRAVENOUS
  Administered 2022-07-06 (×2): 100 ug via INTRAVENOUS

## 2022-07-04 MED ORDER — VANCOMYCIN HCL 750 MG/150ML IV SOLN
750.0000 mg | Freq: Two times a day (BID) | INTRAVENOUS | Status: DC
  Administered 2022-07-04 – 2022-07-05 (×2): 750 mg via INTRAVENOUS
  Filled 2022-07-04 (×2): qty 150

## 2022-07-04 MED ORDER — HYDROCORTISONE SOD SUC (PF) 100 MG IJ SOLR
100.0000 mg | Freq: Two times a day (BID) | INTRAMUSCULAR | Status: DC
  Administered 2022-07-04 – 2022-07-06 (×5): 100 mg via INTRAVENOUS
  Filled 2022-07-04 (×4): qty 2

## 2022-07-04 MED ORDER — SUCCINYLCHOLINE CHLORIDE 200 MG/10ML IV SOSY
PREFILLED_SYRINGE | INTRAVENOUS | Status: AC
  Filled 2022-07-04: qty 10

## 2022-07-04 MED ORDER — SODIUM CHLORIDE 0.9 % IV SOLN
100.0000 mg | INTRAVENOUS | Status: DC
  Administered 2022-07-04 – 2022-07-11 (×8): 100 mg via INTRAVENOUS
  Filled 2022-07-04 (×9): qty 5

## 2022-07-04 MED ORDER — MIDAZOLAM HCL 2 MG/2ML IJ SOLN
INTRAMUSCULAR | Status: AC
  Administered 2022-07-04: 2 mg
  Filled 2022-07-04: qty 2

## 2022-07-04 MED ORDER — SODIUM CHLORIDE 0.9% IV SOLUTION: Freq: Once | INTRAVENOUS | Status: AC

## 2022-07-04 MED ORDER — ETOMIDATE 2 MG/ML IV SOLN
INTRAVENOUS | Status: AC
  Administered 2022-07-04: 14 mg
  Filled 2022-07-04: qty 20

## 2022-07-04 MED ORDER — VANCOMYCIN HCL IN DEXTROSE 1-5 GM/200ML-% IV SOLN
1000.0000 mg | Freq: Once | INTRAVENOUS | Status: AC
  Administered 2022-07-04: 1000 mg via INTRAVENOUS
  Filled 2022-07-04: qty 200

## 2022-07-04 MED ORDER — VASOPRESSIN 20 UNITS/100 ML INFUSION FOR SHOCK
0.0400 [IU]/min | INTRAVENOUS | Status: DC
  Administered 2022-07-04 – 2022-07-05 (×5): 0.04 [IU]/min via INTRAVENOUS
  Filled 2022-07-04 (×5): qty 100

## 2022-07-04 MED ORDER — FENTANYL CITRATE PF 50 MCG/ML IJ SOSY
PREFILLED_SYRINGE | INTRAMUSCULAR | Status: AC
  Filled 2022-07-04: qty 2

## 2022-07-04 MED ORDER — FENTANYL CITRATE PF 50 MCG/ML IJ SOSY
25.0000 ug | PREFILLED_SYRINGE | Freq: Once | INTRAMUSCULAR | Status: AC
  Administered 2022-07-04: 25 ug via INTRAVENOUS

## 2022-07-04 MED ORDER — PROPOFOL 1000 MG/100ML IV EMUL
0.0000 ug/kg/min | INTRAVENOUS | Status: DC
  Administered 2022-07-04 – 2022-07-05 (×2): 25 ug/kg/min via INTRAVENOUS
  Filled 2022-07-04 (×2): qty 100

## 2022-07-04 MED ORDER — POTASSIUM CHLORIDE 10 MEQ/50ML IV SOLN
10.0000 meq | INTRAVENOUS | Status: AC
  Administered 2022-07-04 (×4): 10 meq via INTRAVENOUS
  Filled 2022-07-04 (×4): qty 50

## 2022-07-04 MED ORDER — NOREPINEPHRINE 16 MG/250ML-% IV SOLN
0.0000 ug/min | INTRAVENOUS | Status: DC
  Administered 2022-07-04: 45 ug/min via INTRAVENOUS
  Administered 2022-07-05: 13 ug/min via INTRAVENOUS
  Filled 2022-07-04 (×2): qty 250

## 2022-07-04 NOTE — Progress Notes (Addendum)
12 Days Post-Op   Subjective/Chief Complaint: Denies abdominal pain. Reports some mucous per rectum last 24h.  NG remains high output  Wife and family are at bedside.   Objective: Vital signs in last 24 hours: Temp:  [98 F (36.7 C)-100.2 F (37.9 C)] 100.2 F (37.9 C) (11/28 0800) Pulse Rate:  [122-139] 139 (11/28 0900) Resp:  [26-50] 32 (11/28 0900) BP: (86-124)/(55-97) 103/59 (11/28 0900) SpO2:  [95 %-100 %] 96 % (11/28 0900) FiO2 (%):  [80 %] 80 % (11/28 0800) Weight:  [76.4 kg] 76.4 kg (11/28 0500) Last BM Date : 07/01/22  Intake/Output from previous day: 11/27 0701 - 11/28 0700 In: 4578.9 [I.V.:4174; IV Piggyback:404.9] Out: 4550 [Urine:2050; Emesis/NG output:2500] Intake/Output this shift: Total I/O In: 586.1 [I.V.:494.6; IV Piggyback:91.5] Out: -   Gen: alert, ill appearing Pulm: slightly labored respirations on [place] Abd: firm, distended, overall non-tender, NG in R nare with brown effluent.  GU: external cath in place draining yellow urine Ext: no edema  Lab Results:  Recent Labs    07/03/22 1855 07/04/22 0507  WBC 25.6* 19.4*  HGB 8.2* 7.3*  HCT 25.4* 22.6*  PLT 255 246   BMET Recent Labs    07/03/22 1855 07/04/22 0507  NA 140 146*  K 3.6 3.5  CL 103 107  CO2 25 30  GLUCOSE 151* 142*  BUN 65* 49*  CREATININE 1.60* 1.01  CALCIUM 8.0* 8.1*   PT/INR No results for input(s): "LABPROT", "INR" in the last 72 hours. ABG Recent Labs    07/02/22 1918 07/03/22 1942  PHART  --  7.52*  HCO3 30.4* 28.6*    Studies/Results: CT ABDOMEN PELVIS WO CONTRAST  Result Date: 07/03/2022 CLINICAL DATA:  Peritoneal carcinomatosis with unclear primary source. Follow-up small bowel obstruction. Recent umbilical hernia surgery 06/08/2022. EXAM: CT ABDOMEN AND PELVIS WITHOUT CONTRAST TECHNIQUE: Multidetector CT imaging of the abdomen and pelvis was performed following the standard protocol without IV contrast. RADIATION DOSE REDUCTION: This exam was performed  according to the departmental dose-optimization program which includes automated exposure control, adjustment of the mA and/or kV according to patient size and/or use of iterative reconstruction technique. COMPARISON:  CTA chest 07/02/2022, abdomen and pelvis CT with contrast 06/28/2022, and chest, abdomen and pelvis CT with contrast 06/24/2022. FINDINGS: Lower chest: There are increased small pleural effusions, increasing consolidation change in both lower lobes, and patchy infiltrates also increased in posterior lingula, right upper lobe base and right middle lobe. Findings consistent with multi lobar pneumonia and small parapneumonic effusions. The heart is slightly enlarged. There are coronary artery calcifications. Small pericardial effusion is similar to prior studies. An infusion catheter terminates in the right atrium. Hepatobiliary: The liver, gallbladder and bile ducts show no focal abnormality without contrast. No biliary dilatation. Pancreas: No focal abnormality is seen without contrast. Some of the abdominal ascitic fluid abuts the body of the pancreas. Spleen: Not well seen due to streak artifact from the patient's arms in the field and lack of IV contrast. It is not enlarged. The last study with contrast on November 22 demonstrated several small areas of hypoenhancement in the periphery of the spleen potentially representing peritoneal surface lesions, but the spleen was unremarkable on November 18. I cannot tell if this is present on this study without contrast and with streak artifacts through the spleen. Adrenals/Urinary Tract: There is residual renal cortical enhancement. It has been over 24 hours since the contrast dose from yesterday's CTA chest. Please correlate clinically for contrast nephrotoxicity. A 1.6 cm  cyst anteriorly in the left kidney is again noted. There are a few tiny too small to characterize hypodensities in the kidneys with no follow-up imaging recommended. No adrenal mass is  seen and no urinary stone obstruction. There is mild generalized bladder thickening versus nondistention with contrast in the bladder as well. Stomach/Bowel: NGT loops around within the stomach to the left with the tip just below the cardia. There is moderate debris and fluid within the stomach with gastric distention with air and fluid, distention of the duodenum, jejunum and at least the proximal 2/3 of the ileum with small-bowel dilatation up to 4.4 cm. There are multiple thickened small bowel segments interspersed with nonthickened segments and no single transitional segment could be identified. The small bowel in the lower abdominopelvic quadrants is decompressed and again noted moderately thickened, which could be due to serosal implants. Obstructive etiology could be distal small bowel infiltrating disease, enteritis, occult adhesions or occult internal hernia. Areas of likely peritoneal carcinomatosis of the hepatic and splenic flexures is again noted as well as moderate thickening in the distal sigmoid colon which could reflect changes due to colitis, underdistention or infiltrating disease. Vascular/Lymphatic: There is moderate aortoiliac calcific plaque, calcifications in the visceral branch vessel ostia. There is no AAA. There is no discrete adenopathy but there is evidence of diffuse peritoneal carcinomatosis with extensive omental and peritoneal disease with numerous nodular implants anteriorly. These findings were noted previously but better seen with less ascites following 4.2 L paracentesis November 24. Reproductive: Enlarged prostate 5 cm transverse. Other: Mild-to-moderate abdominopelvic free ascites, less than previously. Body wall anasarca appears similar. There is a left inguinal fat hernia. There is no incarcerated hernia. Musculoskeletal: Bilateral mild-to-moderate hip DJD. Degenerative changes and bridging enthesopathy thoracic spine. Degenerative change lumbar spine. Acquired spinal  stenosis L3-4, L4-5 and L5-S1 with ankylosis across the superior right SI joint. IMPRESSION: 1. Small-bowel obstruction with multiple thickened small bowel segments interspersed with nonthickened segments and no single transitional segment identified. Obstructive etiology could be due to infiltrating disease with irregular lower abdominal small bowel thickening possibly indicating serosal implants, or due to enteritis, occult adhesions or occult internal hernia. 2. Thickened distal sigmoid colon which could be due to colitis, underdistention or infiltrating disease. Additional peritoneal implants along the flexures of the colon. 3. Extensive peritoneal and omental carcinomatosis with mild-to-moderate free ascites, less than previously. 4. Increasing consolidation in both lower lobes and patchy infiltrates in the right upper lobe, right middle lobe and lingula. Increased small pleural effusions. 5. Body wall anasarca. 6. Residual bilateral renal cortical enhancement. Please correlate clinically for contrast nephrotoxicity. 7. Aortic and coronary artery atherosclerosis. 8. Prostatomegaly. 9. Left inguinal fat hernia. No incarcerated hernia. 10. Suboptimal visualization of the spleen due to streak artifact from the patient's arms in the field and lack of IV contrast. The spleen was unremarkable on November 06/24/2022, but on November 22 there were areas of peripheral hypoenhancement which were questioned as possibly due to peritoneal surface lesions. Unable to determine on this study if this is still the case. 11. These results will be called to the ordering physician or representative by the radiologist assistant, and communication documented in the PACS or [facility] dashboard. Aortic Atherosclerosis (ICD10-I70.0). Electronically Signed   By: Telford Nab M.D.   On: 07/03/2022 21:19   CT HEAD WO CONTRAST (5MM)  Result Date: 07/03/2022 CLINICAL DATA:  Stroke suspected EXAM: CT HEAD WITHOUT CONTRAST TECHNIQUE:  Contiguous axial images were obtained from the base of the skull  through the vertex without intravenous contrast. RADIATION DOSE REDUCTION: This exam was performed according to the departmental dose-optimization program which includes automated exposure control, adjustment of the mA and/or kV according to patient size and/or use of iterative reconstruction technique. COMPARISON:  None Available. FINDINGS: Brain: No evidence of acute infarction, hemorrhage, hydrocephalus, extra-axial collection or mass lesion/mass effect. Sequela of mild-to-moderate chronic microvascular ischemic change. Chronic mineralization of the basal ganglia on the right. Vascular: No hyperdense vessel or unexpected calcification. Skull: Normal. Negative for fracture or focal lesion. Sinuses/Orbits: No acute finding. Other: None. IMPRESSION: No acute intracranial abnormality. Electronically Signed   By: Marin Roberts M.D.   On: 07/03/2022 20:41   ECHOCARDIOGRAM COMPLETE  Result Date: 07/03/2022    ECHOCARDIOGRAM REPORT   Patient Name:   Cameron Rice Date of Exam: 07/03/2022 Medical Rec #:  [id number]        Height:       71.0 in Accession #:    [id number]       Weight:       168.0 lb Date of Birth:  08/31/1950        BSA:          1.958 m Patient Age:    71 years         BP:           94/47 mmHg Patient Gender: M                HR:           120 bpm. Exam Location:  Inpatient Procedure: 2D Echo, Cardiac Doppler and Color Doppler Indications:    R06.02 SOB. Acute respiratory distress.  History:        Patient has no prior history of Echocardiogram examinations.                 Risk Factors:Hypertension and Dyslipidemia. Cancer.  Sonographer:    Roseanna Rainbow RDCS Referring Phys: [id number] [place]  Sonographer Comments: Technically difficult study due to poor echo windows. Patient in fowler's position. Very difficult angle and windows. IMPRESSIONS  1. Left ventricular ejection fraction, by estimation, is 60 to 65%. The left ventricle  has normal function. The left ventricle has no regional wall motion abnormalities. Left ventricular diastolic parameters are indeterminate.  2. Mildly D-shaped right ventricle suggesting a degree of RV pressure/volume overload. Right ventricular systolic function is mildly reduced. The right ventricular size is mildly enlarged. Tricuspid regurgitation signal is inadequate for assessing PA pressure.  3. The mitral valve is normal in structure. No evidence of mitral valve regurgitation. No evidence of mitral stenosis.  4. The aortic valve is tricuspid. Aortic valve regurgitation is not visualized. No aortic stenosis is present.  5. The inferior vena cava is normal in size with greater than 50% respiratory variability, suggesting right atrial pressure of 3 mmHg.  6. A small pericardial effusion is present. The pericardial effusion is circumferential. FINDINGS  Left Ventricle: Left ventricular ejection fraction, by estimation, is 60 to 65%. The left ventricle has normal function. The left ventricle has no regional wall motion abnormalities. The left ventricular internal cavity size was normal in size. There is  no left ventricular hypertrophy. Left ventricular diastolic parameters are indeterminate. Right Ventricle: Mildly D-shaped right ventricle suggesting a degree of RV pressure/volume overload. The right ventricular size is mildly enlarged. No increase in right ventricular wall thickness. Right ventricular systolic function is mildly reduced. Tricuspid regurgitation signal is inadequate for assessing PA pressure. Left Atrium:  Left atrial size was normal in size. Right Atrium: Right atrial size was normal in size. Pericardium: A small pericardial effusion is present. The pericardial effusion is circumferential. Mitral Valve: The mitral valve is normal in structure. No evidence of mitral valve regurgitation. No evidence of mitral valve stenosis. Tricuspid Valve: The tricuspid valve is normal in structure. Tricuspid  valve regurgitation is trivial. Aortic Valve: The aortic valve is tricuspid. Aortic valve regurgitation is not visualized. No aortic stenosis is present. Pulmonic Valve: The pulmonic valve was not well visualized. Pulmonic valve regurgitation is not visualized. Aorta: The aortic root is normal in size and structure. Venous: The inferior vena cava is normal in size with greater than 50% respiratory variability, suggesting right atrial pressure of 3 mmHg. IAS/Shunts: No atrial level shunt detected by color flow Doppler.  LEFT VENTRICLE PLAX 2D LVIDd:         3.60 cm     Diastology LVIDs:         2.35 cm     LV e' medial:    9.46 cm/s LV PW:         1.20 cm     LV E/e' medial:  4.9 LV IVS:        1.10 cm     LV e' lateral:   9.03 cm/s LVOT diam:     2.40 cm     LV E/e' lateral: 5.1 LV SV:         60 LV SV Index:   30 LVOT Area:     4.52 cm  LV Volumes (MOD) LV vol d, MOD A2C: 41.9 ml LV vol d, MOD A4C: 38.0 ml LV vol s, MOD A2C: 15.4 ml LV vol s, MOD A4C: 12.0 ml LV SV MOD A2C:     26.5 ml LV SV MOD A4C:     38.0 ml LV SV MOD BP:      30.4 ml RIGHT VENTRICLE             IVC RV S prime:     18.70 cm/s  IVC diam: 2.00 cm TAPSE (M-mode): 1.8 cm LEFT ATRIUM             Index       RIGHT ATRIUM           Index LA diam:        3.10 cm 1.58 cm/m  RA Area:     10.70 cm LA Vol (A2C):   16.4 ml 8.38 ml/m  RA Volume:   17.90 ml  9.14 ml/m LA Vol (A4C):   15.4 ml 7.87 ml/m LA Biplane Vol: 16.1 ml 8.22 ml/m  AORTIC VALVE LVOT Vmax:   102.00 cm/s LVOT Vmean:  62.700 cm/s LVOT VTI:    0.132 m  AORTA Ao Root diam: 3.30 cm Ao Asc diam:  3.00 cm MITRAL VALVE MV Area (PHT): 4.31 cm    SHUNTS MV Decel Time: 176 msec    Systemic VTI:  0.13 m MV E velocity: 46.00 cm/s  Systemic Diam: 2.40 cm MV A velocity: 46.40 cm/s MV E/A ratio:  0.99 MV A Prime:    9.6 cm/s [employer] Electronically signed by Franki Monte Signature Date/Time: 07/03/2022/3:13:07 PM    Final    DG CHEST PORT 1 VIEW  Result Date: 07/03/2022 CLINICAL  DATA:  Aspiration pneumonia EXAM: PORTABLE CHEST 1 VIEW COMPARISON:  07/03/2022 FINDINGS: Left-sided PICC line with the tip projecting over the SVC. Nasogastric tube with the tip projecting over the  stomach. Hazy bibasilar airspace disease concerning for pneumonia. No pleural effusion or pneumothorax. Heart and mediastinal contours are unremarkable. No acute osseous abnormality. IMPRESSION: 1. Hazy bibasilar airspace disease concerning for pneumonia. Electronically Signed   By: Kathreen Devoid M.D.   On: 07/03/2022 08:52   DG CHEST PORT 1 VIEW  Result Date: 07/03/2022 CLINICAL DATA:  Possible aspiration following vomiting, initial encounter EXAM: PORTABLE CHEST 1 VIEW COMPARISON:  07/02/2022 FINDINGS: Cardiac shadow is stable. Left PICC is noted in satisfactory position. Gastric catheter is noted within the stomach. Bibasilar atelectatic changes are noted slightly increased when compared with the prior exam. These changes are similar to that seen on prior CT examination and could be related to aspiration. IMPRESSION: Bibasilar changes which may be related to aspiration. No other focal abnormality is noted. Electronically Signed   By: Inez Catalina M.D.   On: 07/03/2022 02:51   DG Abd Portable 1V  Result Date: 07/03/2022 CLINICAL DATA:  Check gastric catheter placement EXAM: PORTABLE ABDOMEN - 1 VIEW COMPARISON:  None Available. FINDINGS: Gastric catheter is coiled within the stomach. Scattered contrast is noted throughout the colon from prior CT examination. No free air is noted. Contrast is seen within the bladder from recent contrast administration. IMPRESSION: Gastric catheter coiled within the stomach. Electronically Signed   By: Inez Catalina M.D.   On: 07/03/2022 02:50   CT Angio Chest Pulmonary Embolism (PE) W or WO Contrast  Result Date: 07/02/2022 CLINICAL DATA:  Pulmonary embolism suspected, high probability. Tachycardia, shortness of breath. EXAM: CT ANGIOGRAPHY CHEST WITH CONTRAST TECHNIQUE:  Multidetector CT imaging of the chest was performed using the standard protocol during bolus administration of intravenous contrast. Multiplanar CT image reconstructions and MIPs were obtained to evaluate the vascular anatomy. RADIATION DOSE REDUCTION: This exam was performed according to the departmental dose-optimization program which includes automated exposure control, adjustment of the mA and/or kV according to patient size and/or use of iterative reconstruction technique. CONTRAST:  56m OMNIPAQUE IOHEXOL 350 MG/ML SOLN COMPARISON:  06/24/2022. FINDINGS: Cardiovascular: The heart is normal in size a few scattered coronary artery calcifications are noted. There is a trace pericardial effusion. There is mild atherosclerotic calcification of the aorta without evidence of aneurysm. Small residual thrombus in the left upper lobe. No new thrombus is identified. Evaluation is limited due to respiratory motion artifact. Mediastinum/Nodes: No mediastinal, hilar, or axillary lymphadenopathy. The thyroid gland is within normal limits. Debris is present in the trachea. The esophagus is distended with fluid. Lungs/Pleura: Debris is present in the right mainstem bronchus. There is bronchial wall thickening bilaterally with mucous plugging in the right lower lobe. Patchy airspace disease is noted in the right upper and middle lobes and lower lobes bilaterally. There is consolidation at the right lung base. No effusion or pneumothorax. An azygous lobe is noted. Upper Abdomen: Large ascites in the upper abdomen. Musculoskeletal: Degenerative changes in the thoracic spine. No acute osseous abnormality. Review of the MIP images confirms the above findings. IMPRESSION: 1. Left upper lobe pulmonary embolism, not significantly changed from the prior exam. No new lesion is identified. 2. Bronchial wall thickening bilaterally with mucous plugging in the right lower lobe. Scattered airspace opacities in the lungs bilaterally with  consolidation at the right lung base, concerning for pneumonia. 3. Distended fluid-filled esophagus. Aspiration pneumonia should be included in the differential diagnosis. 4. Aortic atherosclerosis and coronary artery calcifications. 5. Large ascites. Electronically Signed   By: LBrett FairyM.D.   On: 07/02/2022 21:10  DG Chest Port 1 View  Result Date: 07/02/2022 CLINICAL DATA:  Lethargy with tachypnea and tachycardia. EXAM: PORTABLE CHEST 1 VIEW COMPARISON:  June 28, 2022 FINDINGS: There is stable left-sided PICC line positioning. The heart size and mediastinal contours are within normal limits. Low lung volumes are noted. Mild atelectasis is seen within the bilateral lung bases. There is no evidence of a pleural effusion or pneumothorax. Multilevel degenerative changes seen throughout the thoracic spine. IMPRESSION: Low lung volumes with mild bibasilar atelectasis. Electronically Signed   By: Virgina Norfolk M.D.   On: 07/02/2022 19:14    Anti-infectives: Anti-infectives (From admission, onward)    Start     Dose/Rate Route Frequency Ordered Stop   07/03/22 0300  piperacillin-tazobactam (ZOSYN) IVPB 3.375 g        3.375 g 12.5 mL/hr over 240 Minutes Intravenous Every 8 hours 07/03/22 0222     06/17/2022 0200  piperacillin-tazobactam (ZOSYN) IVPB 3.375 g  Status:  Discontinued       See Hyperspace for full Linked Orders Report.   3.375 g 12.5 mL/hr over 240 Minutes Intravenous Every 8 hours 06/15/2022 1849 06/27/22 1232   07/01/2022 1900  piperacillin-tazobactam (ZOSYN) IVPB 3.375 g       See Hyperspace for full Linked Orders Report.   3.375 g 100 mL/hr over 30 Minutes Intravenous  Once 06/23/2022 1849 06/27/2022 1940       Assessment/Plan: Post operative abdominal pain with ascites after umbilical hernia repair with mesh by Dr. Rosendo Gros 06/08/22  -POD# 12 s/p LAPAROSCOPY DIAGNOSTIC, BIOPSY OF OMENTAL AND PERITONEAL NODULES, ASPIRATION OF ASCITES FOR CYTOLOGY 11/16 Dr. Ninfa Linden -  pathology and cytology resulted - moderately differentiated adenocarcinoma - CT C/A/P 11/18 showed chronic PE and right common iliac DVT - now on heparin gtt with no sign of bleeding - CT abdomen 11/18 w/ evidence of peritoneal carcinomatosis but no obvious sign of primary tumor.  No pulmonary tumor or mets noted. Repeat ct scan overnight shows new obstructive pattern (SBO), with multiple separate areas of bowel wall thickening, likely related to carcinomatosis.  - continue NG to LIWS.  - unfortunately I do not think there are any surgical treatment options for this patient. Surgery for the SBO would not improve his overall prognosis, it may not even be surgically possible to relieve his obstruction due to significant tumor burden. Given is PE, recent aspiration PNA, and frailty he would also be at at least mild to moderate risk for respiratory complications as a result of general anesthesia.  Would continue decompression with NG tube today.  Will review case with my attending again as well. Spoke to CCM and they plan to review patients plan of care with oncology and with surgical team at [facility]. The patient and his family were explicit that they want to exhaust all possible treatment modalities.   - Appreciate CCM mgmt of acute respiratory failure, aspiration PNA, and acute renal failure (improved today)    ID - wbc 33.7 (from 38), elevated but stable; Zosyn 11/15-11/21, Zosyn 11/27 >>  FEN - NPO, NG LIWS, TPN VTE - hep gtt Foley - d/c 11/17, voiding  HTN HLD BPH Hx UTI    Jill Alexanders 07/04/2022

## 2022-07-04 NOTE — Progress Notes (Addendum)
Copious mucopurulent secretions Given worsening resp status, worsening pressor need - Vaso - Stress steroids - Meropenem/vanc, dc zosyn  Reaching out to [facility] to see and eval for EGD.  Grim prognosis  Erskine Emery MD PCCM

## 2022-07-04 NOTE — Progress Notes (Signed)
[facility] Progress Note Patient Name: Cameron Rice DOB: 1950-08-31 MRN: [id number]   Date of Service  07/04/2022  HPI/Events of Note  Pt intermittently waking up, reaching for ETT.    eICU Interventions  Placed order for bilateral wrist restarints to prevent unplanned extubation.         Freeda Spivey M DELA CRUZ 07/04/2022, 8:46 PM

## 2022-07-04 NOTE — Procedures (Signed)
Intubation Procedure Note  Cameron Rice  [id number]  04/29/1951  Date:07/04/22  Time:2:16 PM   Provider Performing:Jessyca Sloan C Tamala Julian    Procedure: Intubation (31500)  Indication(s) Respiratory Failure  Consent Risks of the procedure as well as the alternatives and risks of each were explained to the patient and/or caregiver.  Consent for the procedure was obtained and is signed in the bedside chart   Anesthesia Etomidate, Versed, and Rocuronium   Time Out Verified patient identification, verified procedure, site/side was marked, verified correct patient position, special equipment/implants available, medications/allergies/relevant history reviewed, required imaging and test results available.   Sterile Technique Usual hand hygeine, masks, and gloves were used   Procedure Description Patient positioned in bed supine.  Sedation given as noted above.  Patient was intubated with endotracheal tube using Glidescope.  View was Grade 1 full glottis .  Number of attempts was 1.  Colorimetric CO2 detector was consistent with tracheal placement. Copious mucopurulent secretions seen from ETT.   Complications/Tolerance None; patient tolerated the procedure well. Chest X-ray is ordered to verify placement.   EBL Minimal   Specimen(s) None

## 2022-07-04 NOTE — Progress Notes (Signed)
Cameron Rice   DOB:March 21, 1951   BC#:[id number]   HWT#:[id number]  Oncology follow up   Subjective: Pt was previously seen by my colleague Dr. Lindi Adie a week ago for his newly diagnosed metastatic peritoneal carcinomatosis with unknown primary.  Due to his worsening clinical course, I was called by ICU team Dr. Tamala Julian this morning to follow-up with this patient.  Patient was intubated today for respiratory failure.  I met his wife and his extended family in the waiting room of ICU. I had prolonged and extensive discussion with his family.   Objective:  Vitals:   07/04/22 1945 07/04/22 2003  BP:    Pulse:  (!) 103  Resp:  (!) 24  Temp: 98.6 F (37 C)   SpO2:  99%    Body mass index is 23.49 kg/m.  Intake/Output Summary (Last 24 hours) at 07/04/2022 2048 Last data filed at 07/04/2022 2000 Gross per 24 hour  Intake 5307.55 ml  Output 3750 ml  Net 1557.55 ml     Sclerae unicteric  Pt is intubated and on pressor   No peripheral adenopathy  Heart regular rate and rhythm  Abdomen distended     CBG (last 3)  Recent Labs    07/04/22 1227 07/04/22 1639 07/04/22 1945  GLUCAP 100* 220* 304*     Labs:  Urine Studies No results for input(s): "UHGB", "CRYS" in the last 72 hours.  Invalid input(s): "UACOL", "UAPR", "USPG", "UPH", "UTP", "UGL", "UKET", "UBIL", "UNIT", "UROB", "ULEU", "UEPI", "UWBC", "URBC", "UBAC", "CAST", "UCOM", "BILUA"  Basic Metabolic Panel: Recent Labs  Lab 06/29/22 0304 06/30/22 0508 07/02/22 0302 07/02/22 1918 07/03/22 0500 07/03/22 1855 07/04/22 0507  NA 143 143 142 140 140 140 146*  K 3.9 3.8 3.7 3.9 3.9 3.6 3.5  CL 107 108 107 105 104 103 107  CO2 _0 GLUCOSE 149* 156* 131* 165* 144* 151* 142*  BUN 23 33* 34* 52* 71* 65* 49*  CREATININE 0.88 0.91 0.80 1.86* 2.22* 1.60* 1.01  CALCIUM 8.6* 8.7* 8.2* 8.0* 8.1* 8.0* 8.1*  MG 2.4 2.3 1.9  --  1.9 1.8 2.1  PHOS 2.8 3.2 3.6  --  4.1  --  3.2   GFR Estimated Creatinine  Clearance: 73.5 mL/min (by C-G formula based on SCr of 1.01 mg/dL). Liver Function Tests: Recent Labs  Lab 07/02/22 0302 07/02/22 1918 07/03/22 0500 07/03/22 1855 07/04/22 0507  AST 51* 40 41 31 29  ALT 48* 43 46* 36 30  ALKPHOS 139* 125 126 92 79  BILITOT 1.7* 1.6* 2.3* 2.7* 2.8*  PROT 5.9* 5.5* 5.5* 5.2* 5.6*  ALBUMIN 1.8* 1.8* 1.7* 2.4* 2.6*   No results for input(s): "LIPASE", "AMYLASE" in the last 168 hours. Recent Labs  Lab 07/02/22 1918  AMMONIA 36*   Coagulation profile Recent Labs  Lab 07/04/22 1830  INR PENDING    CBC: Recent Labs  Lab 07/02/22 0302 07/02/22 1918 07/03/22 0500 07/03/22 1855 07/04/22 0507 07/04/22 1830  WBC 41.3* 38.5* 33.7* 25.6* 19.4*  --   NEUTROABS 37.2* 34.7* 27.7*  --   --   --   HGB 11.9* 11.5* 10.5* 8.2* 7.3*  --   HCT 38.2* 35.8* 32.5* 25.4* 22.6*  --   MCV 95.5 94.2 92.9 93.4 94.2  --   PLT 384 352 333 255 246 201   Cardiac Enzymes: No results for input(s): "CKTOTAL", "CKMB", "CKMBINDEX", "TROPONINI" in the last 168 hours. BNP: Invalid input(s): "POCBNP" CBG: Recent Labs  Lab  07/04/22 0259 07/04/22 0813 07/04/22 1227 07/04/22 1639 07/04/22 1945  GLUCAP 150* 123* 100* 220* 304*   D-Dimer Recent Labs    07/04/22 1830  DDIMER PENDING   Hgb A1c No results for input(s): "HGBA1C" in the last 72 hours. Lipid Profile Recent Labs    07/03/22 0500 07/04/22 0500  TRIG 243* 321*   Thyroid function studies Recent Labs    07/02/22 1918  TSH 3.674   Anemia work up Recent Labs    07/02/22 [place] 3,293*   Microbiology Recent Results (from the past 240 hour(s))  Body fluid culture w Gram Stain     Status: None   Collection Time: 06/30/22  3:25 PM   Specimen: PATH Cytology Peritoneal fluid  Result Value Ref Range Status   Specimen Description   Final    PERITONEAL Performed at [facility] [address], [place], [place] [address]    Special Requests   Final     NONE Performed at [facility], [place] [address], [place], [place] [address]    Gram Stain   Final    RARE WBC PRESENT, PREDOMINANTLY MONONUCLEAR NO ORGANISMS SEEN    Culture   Final    NO GROWTH 3 DAYS Performed at [facility], [place] [address], [place], [place] [address]    Report Status 07/04/2022 FINAL  Final  Anaerobic culture w Gram Stain     Status: None (Preliminary result)   Collection Time: 06/30/22  3:25 PM   Specimen: Peritoneal Washings  Result Value Ref Range Status   Specimen Description PERITONEAL  Final   Special Requests NONE  Final   Gram Stain   Final    RARE WBC PRESENT, PREDOMINANTLY MONONUCLEAR NO ORGANISMS SEEN Performed at [facility], [place] [address], [place], [place] [address]    Culture   Final    NO ANAEROBES ISOLATED; CULTURE IN PROGRESS FOR 5 DAYS   Report Status PENDING  Incomplete  Culture, blood (Routine X 2) w Reflex to ID Panel     Status: None (Preliminary result)   Collection Time: 07/02/22  7:43 PM   Specimen: BLOOD RIGHT HAND  Result Value Ref Range Status   Specimen Description   Final    BLOOD RIGHT HAND Performed at [facility], [place] [address] [place], [place] [address]    Special Requests   Final    BOTTLES DRAWN AEROBIC ONLY Blood Culture results may not be optimal due to an inadequate volume of blood received in culture bottles Performed at [facility] [address], [place], [place] [address]    Culture   Final    NO GROWTH 2 DAYS Performed at [facility] [address] [place], [place] [address]    Report Status PENDING  Incomplete  Culture, blood (Routine X 2) w Reflex to ID Panel     Status: None (Preliminary result)   Collection Time: 07/02/22  7:43 PM   Specimen: BLOOD RIGHT FOREARM  Result Value Ref Range Status   Specimen Description   Final    BLOOD RIGHT FOREARM Performed at [facility], [place] [address], [place], [place] [address]     Special Requests   Final    IN PEDIATRIC BOTTLE Blood Culture adequate volume Performed at [facility] [address], [place], [place] [address]    Culture   Final    NO GROWTH 2 DAYS Performed  at [facility], [place] [address], [place], [place] [address]    Report Status PENDING  Incomplete  Peritoneal fluid culture w Gram Stain     Status: None (Preliminary result)   Collection Time: 07/03/22 12:43 PM   Specimen: Peritoneal Fluid  Result Value Ref Range Status   Specimen Description   Final    FLUID PERITONEAL Performed at [facility], [place] [address] [place], [place] [address]    Special Requests   Final    NONE Performed at [facility], [place] [address], [place], [place] [address]    Gram Stain NO WBC SEEN NO ORGANISMS SEEN   Final   Culture   Final    NO GROWTH < 24 HOURS Performed at [facility], [place] [address], [place], [place] [address]    Report Status PENDING  Incomplete      Studies:  DG CHEST PORT 1 VIEW  Result Date: 07/04/2022 CLINICAL DATA:  Central line placement EXAM: PORTABLE CHEST 1 VIEW COMPARISON:  Chest 07/04/2022 FINDINGS: Endotracheal tube in good position. Left jugular central venous catheter tip difficult to visualize but probably near the cavoatrial junction or right atrium. Left arm PICC tip also in place entering the SVC with tip not well visualized. Mild thorax NG tube in the stomach Bibasilar airspace disease unchanged. Small pleural effusions unchanged. IMPRESSION: 1. Central venous catheter tip difficult to visualize but probably near the cavoatrial junction or right atrium. 2. Bibasilar airspace disease and small effusions unchanged. Electronically Signed   By: Franchot Gallo M.D.   On: 07/04/2022 16:54   DG CHEST PORT 1 VIEW  Result Date: 07/04/2022 CLINICAL DATA:  Intubation. EXAM: PORTABLE CHEST 1 VIEW COMPARISON:  07/03/2022 FINDINGS: The endotracheal tube is 2.6 cm above the  carina. The NG tube is coursing down the esophagus and into the stomach. Small bilateral pleural effusions and slightly progressive bibasilar atelectasis. IMPRESSION: 1. Endotracheal tube and NG tube in good position. 2. Small bilateral pleural effusions and slightly progressive bibasilar infiltrates. Electronically Signed   By: Marijo Sanes M.D.   On: 07/04/2022 14:28   CT ABDOMEN PELVIS WO CONTRAST  Result Date: 07/03/2022 CLINICAL DATA:  Peritoneal carcinomatosis with unclear primary source. Follow-up small bowel obstruction. Recent umbilical hernia surgery 06/08/2022. EXAM: CT ABDOMEN AND PELVIS WITHOUT CONTRAST TECHNIQUE: Multidetector CT imaging of the abdomen and pelvis was performed following the standard protocol without IV contrast. RADIATION DOSE REDUCTION: This exam was performed according to the departmental dose-optimization program which includes automated exposure control, adjustment of the mA and/or kV according to patient size and/or use of iterative reconstruction technique. COMPARISON:  CTA chest 07/02/2022, abdomen and pelvis CT with contrast 06/28/2022, and chest, abdomen and pelvis CT with contrast 06/24/2022. FINDINGS: Lower chest: There are increased small pleural effusions, increasing consolidation change in both lower lobes, and patchy infiltrates also increased in posterior lingula, right upper lobe base and right middle lobe. Findings consistent with multi lobar pneumonia and small parapneumonic effusions. The heart is slightly enlarged. There are coronary artery calcifications. Small pericardial effusion is similar to prior studies. An infusion catheter terminates in the right atrium. Hepatobiliary: The liver, gallbladder and bile ducts show no focal abnormality without contrast. No biliary dilatation. Pancreas: No focal abnormality is seen without contrast. Some of the abdominal ascitic fluid abuts the body of the pancreas. Spleen: Not well seen due to streak artifact from the  patient's arms in the field and lack of IV contrast.  It is not enlarged. The last study with contrast on November 22 demonstrated several small areas of hypoenhancement in the periphery of the spleen potentially representing peritoneal surface lesions, but the spleen was unremarkable on November 18. I cannot tell if this is present on this study without contrast and with streak artifacts through the spleen. Adrenals/Urinary Tract: There is residual renal cortical enhancement. It has been over 24 hours since the contrast dose from yesterday's CTA chest. Please correlate clinically for contrast nephrotoxicity. A 1.6 cm cyst anteriorly in the left kidney is again noted. There are a few tiny too small to characterize hypodensities in the kidneys with no follow-up imaging recommended. No adrenal mass is seen and no urinary stone obstruction. There is mild generalized bladder thickening versus nondistention with contrast in the bladder as well. Stomach/Bowel: NGT loops around within the stomach to the left with the tip just below the cardia. There is moderate debris and fluid within the stomach with gastric distention with air and fluid, distention of the duodenum, jejunum and at least the proximal 2/3 of the ileum with small-bowel dilatation up to 4.4 cm. There are multiple thickened small bowel segments interspersed with nonthickened segments and no single transitional segment could be identified. The small bowel in the lower abdominopelvic quadrants is decompressed and again noted moderately thickened, which could be due to serosal implants. Obstructive etiology could be distal small bowel infiltrating disease, enteritis, occult adhesions or occult internal hernia. Areas of likely peritoneal carcinomatosis of the hepatic and splenic flexures is again noted as well as moderate thickening in the distal sigmoid colon which could reflect changes due to colitis, underdistention or infiltrating disease. Vascular/Lymphatic:  There is moderate aortoiliac calcific plaque, calcifications in the visceral branch vessel ostia. There is no AAA. There is no discrete adenopathy but there is evidence of diffuse peritoneal carcinomatosis with extensive omental and peritoneal disease with numerous nodular implants anteriorly. These findings were noted previously but better seen with less ascites following 4.2 L paracentesis November 24. Reproductive: Enlarged prostate 5 cm transverse. Other: Mild-to-moderate abdominopelvic free ascites, less than previously. Body wall anasarca appears similar. There is a left inguinal fat hernia. There is no incarcerated hernia. Musculoskeletal: Bilateral mild-to-moderate hip DJD. Degenerative changes and bridging enthesopathy thoracic spine. Degenerative change lumbar spine. Acquired spinal stenosis L3-4, L4-5 and L5-S1 with ankylosis across the superior right SI joint. IMPRESSION: 1. Small-bowel obstruction with multiple thickened small bowel segments interspersed with nonthickened segments and no single transitional segment identified. Obstructive etiology could be due to infiltrating disease with irregular lower abdominal small bowel thickening possibly indicating serosal implants, or due to enteritis, occult adhesions or occult internal hernia. 2. Thickened distal sigmoid colon which could be due to colitis, underdistention or infiltrating disease. Additional peritoneal implants along the flexures of the colon. 3. Extensive peritoneal and omental carcinomatosis with mild-to-moderate free ascites, less than previously. 4. Increasing consolidation in both lower lobes and patchy infiltrates in the right upper lobe, right middle lobe and lingula. Increased small pleural effusions. 5. Body wall anasarca. 6. Residual bilateral renal cortical enhancement. Please correlate clinically for contrast nephrotoxicity. 7. Aortic and coronary artery atherosclerosis. 8. Prostatomegaly. 9. Left inguinal fat hernia. No  incarcerated hernia. 10. Suboptimal visualization of the spleen due to streak artifact from the patient's arms in the field and lack of IV contrast. The spleen was unremarkable on November 06/24/2022, but on November 22 there were areas of peripheral hypoenhancement which were questioned as possibly due to peritoneal surface lesions. Unable  to determine on this study if this is still the case. 11. These results will be called to the ordering physician or representative by the radiologist assistant, and communication documented in the PACS or [facility] dashboard. Aortic Atherosclerosis (ICD10-I70.0). Electronically Signed   By: Telford Nab M.D.   On: 07/03/2022 21:19   CT HEAD WO CONTRAST (5MM)  Result Date: 07/03/2022 CLINICAL DATA:  Stroke suspected EXAM: CT HEAD WITHOUT CONTRAST TECHNIQUE: Contiguous axial images were obtained from the base of the skull through the vertex without intravenous contrast. RADIATION DOSE REDUCTION: This exam was performed according to the departmental dose-optimization program which includes automated exposure control, adjustment of the mA and/or kV according to patient size and/or use of iterative reconstruction technique. COMPARISON:  None Available. FINDINGS: Brain: No evidence of acute infarction, hemorrhage, hydrocephalus, extra-axial collection or mass lesion/mass effect. Sequela of mild-to-moderate chronic microvascular ischemic change. Chronic mineralization of the basal ganglia on the right. Vascular: No hyperdense vessel or unexpected calcification. Skull: Normal. Negative for fracture or focal lesion. Sinuses/Orbits: No acute finding. Other: None. IMPRESSION: No acute intracranial abnormality. Electronically Signed   By: Marin Roberts M.D.   On: 07/03/2022 20:41   ECHOCARDIOGRAM COMPLETE  Result Date: 07/03/2022    ECHOCARDIOGRAM REPORT   Patient Name:   Cameron Rice Date of Exam: 07/03/2022 Medical Rec #:  [id number]        Height:       71.0 in Accession #:     [id number]       Weight:       168.0 lb Date of Birth:  May 06, 1951        BSA:          1.958 m Patient Age:    71 years         BP:           94/47 mmHg Patient Gender: M                HR:           120 bpm. Exam Location:  Inpatient Procedure: 2D Echo, Cardiac Doppler and Color Doppler Indications:    R06.02 SOB. Acute respiratory distress.  History:        Patient has no prior history of Echocardiogram examinations.                 Risk Factors:Hypertension and Dyslipidemia. Cancer.  Sonographer:    Roseanna Rainbow RDCS Referring Phys: [id number] [place]  Sonographer Comments: Technically difficult study due to poor echo windows. Patient in fowler's position. Very difficult angle and windows. IMPRESSIONS  1. Left ventricular ejection fraction, by estimation, is 60 to 65%. The left ventricle has normal function. The left ventricle has no regional wall motion abnormalities. Left ventricular diastolic parameters are indeterminate.  2. Mildly D-shaped right ventricle suggesting a degree of RV pressure/volume overload. Right ventricular systolic function is mildly reduced. The right ventricular size is mildly enlarged. Tricuspid regurgitation signal is inadequate for assessing PA pressure.  3. The mitral valve is normal in structure. No evidence of mitral valve regurgitation. No evidence of mitral stenosis.  4. The aortic valve is tricuspid. Aortic valve regurgitation is not visualized. No aortic stenosis is present.  5. The inferior vena cava is normal in size with greater than 50% respiratory variability, suggesting right atrial pressure of 3 mmHg.  6. A small pericardial effusion is present. The pericardial effusion is circumferential. FINDINGS  Left Ventricle: Left ventricular ejection fraction, by estimation, is  60 to 65%. The left ventricle has normal function. The left ventricle has no regional wall motion abnormalities. The left ventricular internal cavity size was normal in size. There is  no left  ventricular hypertrophy. Left ventricular diastolic parameters are indeterminate. Right Ventricle: Mildly D-shaped right ventricle suggesting a degree of RV pressure/volume overload. The right ventricular size is mildly enlarged. No increase in right ventricular wall thickness. Right ventricular systolic function is mildly reduced. Tricuspid regurgitation signal is inadequate for assessing PA pressure. Left Atrium: Left atrial size was normal in size. Right Atrium: Right atrial size was normal in size. Pericardium: A small pericardial effusion is present. The pericardial effusion is circumferential. Mitral Valve: The mitral valve is normal in structure. No evidence of mitral valve regurgitation. No evidence of mitral valve stenosis. Tricuspid Valve: The tricuspid valve is normal in structure. Tricuspid valve regurgitation is trivial. Aortic Valve: The aortic valve is tricuspid. Aortic valve regurgitation is not visualized. No aortic stenosis is present. Pulmonic Valve: The pulmonic valve was not well visualized. Pulmonic valve regurgitation is not visualized. Aorta: The aortic root is normal in size and structure. Venous: The inferior vena cava is normal in size with greater than 50% respiratory variability, suggesting right atrial pressure of 3 mmHg. IAS/Shunts: No atrial level shunt detected by color flow Doppler.  LEFT VENTRICLE PLAX 2D LVIDd:         3.60 cm     Diastology LVIDs:         2.35 cm     LV e' medial:    9.46 cm/s LV PW:         1.20 cm     LV E/e' medial:  4.9 LV IVS:        1.10 cm     LV e' lateral:   9.03 cm/s LVOT diam:     2.40 cm     LV E/e' lateral: 5.1 LV SV:         60 LV SV Index:   30 LVOT Area:     4.52 cm  LV Volumes (MOD) LV vol d, MOD A2C: 41.9 ml LV vol d, MOD A4C: 38.0 ml LV vol s, MOD A2C: 15.4 ml LV vol s, MOD A4C: 12.0 ml LV SV MOD A2C:     26.5 ml LV SV MOD A4C:     38.0 ml LV SV MOD BP:      30.4 ml RIGHT VENTRICLE             IVC RV S prime:     18.70 cm/s  IVC diam: 2.00 cm  TAPSE (M-mode): 1.8 cm LEFT ATRIUM             Index       RIGHT ATRIUM           Index LA diam:        3.10 cm 1.58 cm/m  RA Area:     10.70 cm LA Vol (A2C):   16.4 ml 8.38 ml/m  RA Volume:   17.90 ml  9.14 ml/m LA Vol (A4C):   15.4 ml 7.87 ml/m LA Biplane Vol: 16.1 ml 8.22 ml/m  AORTIC VALVE LVOT Vmax:   102.00 cm/s LVOT Vmean:  62.700 cm/s LVOT VTI:    0.132 m  AORTA Ao Root diam: 3.30 cm Ao Asc diam:  3.00 cm MITRAL VALVE MV Area (PHT): 4.31 cm    SHUNTS MV Decel Time: 176 msec    Systemic VTI:  0.13 m MV  E velocity: 46.00 cm/s  Systemic Diam: 2.40 cm MV A velocity: 46.40 cm/s MV E/A ratio:  0.99 MV A Prime:    9.6 cm/s Dalton McleanMD Electronically signed by Franki Monte Signature Date/Time: 07/03/2022/3:13:07 PM    Final    DG CHEST PORT 1 VIEW  Result Date: 07/03/2022 CLINICAL DATA:  Aspiration pneumonia EXAM: PORTABLE CHEST 1 VIEW COMPARISON:  07/03/2022 FINDINGS: Left-sided PICC line with the tip projecting over the SVC. Nasogastric tube with the tip projecting over the stomach. Hazy bibasilar airspace disease concerning for pneumonia. No pleural effusion or pneumothorax. Heart and mediastinal contours are unremarkable. No acute osseous abnormality. IMPRESSION: 1. Hazy bibasilar airspace disease concerning for pneumonia. Electronically Signed   By: Kathreen Devoid M.D.   On: 07/03/2022 08:52   DG CHEST PORT 1 VIEW  Result Date: 07/03/2022 CLINICAL DATA:  Possible aspiration following vomiting, initial encounter EXAM: PORTABLE CHEST 1 VIEW COMPARISON:  07/02/2022 FINDINGS: Cardiac shadow is stable. Left PICC is noted in satisfactory position. Gastric catheter is noted within the stomach. Bibasilar atelectatic changes are noted slightly increased when compared with the prior exam. These changes are similar to that seen on prior CT examination and could be related to aspiration. IMPRESSION: Bibasilar changes which may be related to aspiration. No other focal abnormality is noted.  Electronically Signed   By: Inez Catalina M.D.   On: 07/03/2022 02:51   DG Abd Portable 1V  Result Date: 07/03/2022 CLINICAL DATA:  Check gastric catheter placement EXAM: PORTABLE ABDOMEN - 1 VIEW COMPARISON:  None Available. FINDINGS: Gastric catheter is coiled within the stomach. Scattered contrast is noted throughout the colon from prior CT examination. No free air is noted. Contrast is seen within the bladder from recent contrast administration. IMPRESSION: Gastric catheter coiled within the stomach. Electronically Signed   By: Inez Catalina M.D.   On: 07/03/2022 02:50    Assessment: 71 y.o. 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 His omentum biopsy confirmed adenocarcinoma, immunostain was positive for CK7, and PAX8, therefore CK20, CDX2, and TTF-1.  The immunostain not a specific for the origin of tumor, but primary lung or colorectal cancer are less likely given the pattern of IHC. Given the clinical presentation I think upper GI especially gastric cancer is likely the primary,although primary peritoneal carcinoma is also a possibility.  Patient had an unremarkable colonoscopy about a year ago at [facility].  He was seen by GI today, and plan for EGD tomorrow if he is stable. -Fortunately this is unlikely curable disease.  Patient's wife and daughter have previously requested to transfer his oncology care to [facility] for possible debulking surgery HIPEC, which is not available in our health system.  Based on my experience, he is not a candidate for upfront HIPEC surgery due to bulky and extensive disease in the peritoneum.  I had a long conversation with patient's wife and the rest of family that he  would require systemic treatment, including chemotherapy, and possible biological agent to shrink his peritoneal carcinomatosis, before any debulking surgery.  Unfortunately his clinical course has been complicated and deteriorated in the past 2 weeks, he now has developed respite failure due to aspiration pneumonia, and he also has small bowel  obstruction/ileus requiring NG decompression. His prognosis is very poor. He is not a candidate for chemo unless he is able to recover well and be discharged from hospital.  -Dr. Lindi Adie has requested FO to see if he is a candidate for targeted therapy or immunotherapy.  The results still pending. -He has significant family history of breast and pancreatic cancer, I will request MMR on his biopsy, to rule out Lynch syndrome.  He also would benefit genetic testing, unfortunately this is not available for inpatient. -Patient's family, especially his wife, are very upset and unhappy about his care here, because they have requested to be transferred to [facility] since he came in. I think the original plan was letting him to see medical and surgical oncologist at [facility] after discharge, but he has been getting worse since admission, I am not sure if inpt transfer was requested.  -I spoke with Dr. Tamala Julian this morning, before his intubation  -I did talked to Dr. Lindi Adie after I visit with pt and his family. I will circle back and call his wife tomorrow  -I spent about 40-45 mins with his family today to answer their questions. I will continue to f/u while he is here.  -I agree with blood transfusion if Hg<7.0    Truitt Merle, MD 07/04/2022

## 2022-07-04 NOTE — Progress Notes (Signed)
Collected ordered sputum sample. RN aware sample is ready and labeled- needs paperwork and to Lab.

## 2022-07-04 NOTE — Progress Notes (Signed)
PHARMACY - TOTAL PARENTERAL NUTRITION CONSULT NOTE   Indication: SBO with Prolonged ileus  Patient Measurements: Height: _0  (180.3 cm) Weight: 76.4 kg (168 lb 6.9 oz) IBW/kg (Calculated) : 75.3 TPN AdjBW (KG): 76.2 Body mass index is 23.49 kg/m. Usual Weight:    Assessment:  Post operative abdominal pain with ascites after umbilical hernia repair with mesh on 06/08/22.   On 11/16 he had Lap biopsy and ascites aspiration.  Now has ileus with SBO.  Glucose / Insulin: No h/o DM noted. A1C 4.8.  - CBGs <180 (141 - 165) - SSI used: 14 units last 24h Electrolytes: Na up to 146 (none in TPN), K 3.5, Mag 2.1, Phos 3.2 (goal K>=4 and Mg>=2 with ileus.) CoCa 9.22 ok.  Renal: h/o BPH (holding Flomax) Scr up to 2.22, BUN 71.prerenal AKI 2nd ATN,  Hepatic: AST/ALT/Alk phos: OK.  Tbili 2.8 up, no jaundice Triglycerides 189> 243. Albumin 2.6 Intake / Output; MIVF:  Output:  NG 2500 mls/24 hrs, UOP 2050 mls/24hr, LBM 11/25 PO: 210 mls/24h (CLD) mIVF: NS @ 50 ml/hr, TPN @ 85 ml/hr  GI Imaging: 11/15: CT: Mildly dilated loops of small bowel in the left lower quadrant with scattered air-fluid levels, nonspecific for ileus versus early obstruction 11/17: Xray: ileus vs obstruction 11/18: CT:  peritoneal carcinomatosis,  partial bowel obstruction/ileus 11/27 CT: SBO, extensive peritoneal & omental carcinomatosis w/ free ascites (less than prev)  GI Surgeries / Procedures:  11/2 hernia repair 11/16 diagnostic laparoscopy  Central access: PICC 11/21 TPN start date: 11/21  Nutritional Goals: Goal TPN rate is  85 mL/hr to provide 108 g of protein and 2154 kcals per day)  RD Assessment: Estimated Needs Total Energy Estimated Needs: 2100-2300 Total Protein Estimated Needs: 95-110g Total Fluid Estimated Needs: 2.1L/day  Current Nutrition:  NPO and TPN NG LIWS   Plan:  Now: 4 runs K Continue TPN at goal rate 57m/hr at 1800.  Electrolytes in TPN: Na 045m/L, increase K 55 mEq/L, Ca  45m24mL, Mg 2mE70m, and Phos 10mm39m. Cl:Ac 1:2 (Unable to keep 1:1 ratio with elyte doses) Add standard MVI and trace elements to TPN IVF NS 50 ml/hr per MD Monitor TPN labs on Mon/Thurs and PRN, BMET 11/29 AM  MicheEudelia [email or web address] Use secure chat for questions 07/04/2022 9:49 AM

## 2022-07-04 NOTE — Progress Notes (Signed)
Pharmacy Antibiotic Note  Cameron Rice is a 71 y.o. 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 Patient unfortunately aspirated on 11/26 and was treated with Zosyn. Due to continued decline Pharmacy has been consulted to broaden antibiotics to vancomycin and meropenem. Also noted to have been started on empiric antifungal coverage per CCM.  Plan: Vancomycin 1000 mg IV now, then 750 mg IV q12 hr (est AUC 420 based on SCr 1.01; Vd 0.72) Measure vancomycin AUC at steady state as indicated SCr q48 while on vanc (just recovered from AKI) MRSA PCR ordered; f/u and narrow vanc as appropriate Meropenem 1g IV q8 hr Mycamine per MD; dosing appropriate  Height: '5\' 11"'$  (180.3 cm) Weight: 76.4 kg (168 lb 6.9 oz) IBW/kg (Calculated) : 75.3  Temp (24hrs), Avg:99 F (37.2 C), Min:98 F (36.7 C), Max:100.2 F (37.9 C)  Recent Labs  Lab 07/02/22 0302 07/02/22 1918 07/02/22 2222 07/03/22 0500 07/03/22 1050 07/03/22 1855 07/04/22 0507 07/04/22 0905 07/04/22 1045  WBC 41.3* 38.5*  --  33.7*  --  25.6* 19.4*  --   --   CREATININE 0.80 1.86*  --  2.22*  --  1.60* 1.01  --   --   LATICACIDVEN  --  2.6* 2.5*  --  2.6*  --   --  2.1* 2.6*    Estimated Creatinine Clearance: 73.5 mL/min (by C-G formula based on SCr of 1.01 mg/dL).    No Known Allergies  Antimicrobials this admission: 11/15 zosyn >> 11/21; restarted 11/27 >>11/28 11/28 vancomycin >> 11/28 meropenem >> 11/28 micafungin >>   Microbiology results: 11/27 peritoneal fluid: ngtd 11/26 BCx: ngtd 11/24 peritoneal: NGF 11/16 BCx: NGF 11/16 MRSA PCR: negative    Thank you for allowing pharmacy to be a part of this patient's care.  Alyscia Carmon A 07/04/2022 3:28 PM

## 2022-07-04 NOTE — Progress Notes (Signed)
ANTICOAGULATION CONSULT NOTE - Follow Up Consult  Pharmacy Consult for Heparin Indication: pulmonary embolus  No Known Allergies  Patient Measurements: Height: '5\' 11"'$  (180.3 cm) Weight: 76.2 kg (167 lb 15.9 oz) IBW/kg (Calculated) : 75.3 Heparin Dosing Weight:  80.8 kg  Vital Signs: Temp: 98 F (36.7 C) (11/28 0400) Temp Source: Oral (11/28 0400) BP: 103/59 (11/28 0445) Pulse Rate: 126 (11/28 0445)  Labs: Recent Labs    07/02/22 1918 07/02/22 2222 07/03/22 0500 07/03/22 1711 07/03/22 1855 07/04/22 0507  HGB 11.5*  --  10.5*  --  8.2* 7.3*  HCT 35.8*  --  32.5*  --  25.4* 22.6*  PLT 352  --  333  --  255 246  HEPARINUNFRC 0.53  --  0.81* 0.30  --  0.33  CREATININE 1.86*  --  2.22*  --  1.60*  --   TROPONINIHS 37* 43*  --   --   --   --      Estimated Creatinine Clearance: 46.4 mL/min (A) (by C-G formula based on SCr of 1.6 mg/dL (H)).  Brief Assessment: 49 yoM admitted on 11/15.  Pharmacy was consulted on 11/18 to dose heparin IV for new PE on CT (and suspicious for right common iliac DVT). Dopplers negative for DVT.  07/04/2022 HL 0.33 therapeutic on 1700 units/hr No bleeding reported  Goal of Therapy:  Heparin level 0.3-0.7 units/ml Monitor platelets by anticoagulation protocol: Yes   Plan:  Continue heparin drip at 1700 units/hr Daily heparin level and CBC  Dolly Rias RPh 07/04/2022, 5:42 AM

## 2022-07-04 NOTE — Progress Notes (Signed)
Spoke with surgeon below.  Assuming we can stabilize from respiratory/ cardiac/ renal standpoint they would consider diverting ostomy if persistent obstruction symptoms.  Dr. Crisoforo [facility] Surgical Oncologist [phone]

## 2022-07-04 NOTE — Consult Note (Signed)
[facility] Gastroenterology Consult  Referring Provider: No ref. provider found Primary Care Physician:  Nche, Charlene Brooke, NP Primary Gastroenterologist: Eusebio Friendly [facility])  Reason for Consultation: peritoneal carcinomatosis of undetermined primary, question coffee ground emesis, normocytic anemia  SUBJECTIVE:   HPI: Cameron Rice is a 71 y.o. male with past medical history of umbilical hernia repair on 06/08/22, G6PD deficiency, hyperlipidemia, hypertension and complicated UTI. He presented to [facility] on 06/12/2022 with generalized abdominal pain, abdominal distention, nausea and vomiting. Labs on presentation showed Na 138, K 4.1, BUN/Cr 24/0.82, ALP 234, AST/ALT 163/121, total bilirubin 1.2, lipase 26, WBC 22.9, Hgb 13.6, PLT 325. CT scan of abdomen and pelvis on 06/25/2022 showed extensive high density ascites, infiltration of omentum, mildly dilated loops of small bowel (ileus versus early obstruction). He underwent diagnostic laparoscopy on 06/20/2022 which revealed diffuse intra-abdominal carcinomatosis with studding of small and large bowel, omentum and peritoneum. Biopsies were obtained and returned on 06/27/22 with metastatic adenocarcinoma, moderately differentiated. A primary source of malignancy was not identified. CT scan of chest on 11/18 showed pulmonary embolism and DVT, he was started on heparin gtt. CT scan of abdomen/pelvis on 11/18 showed 5 mm hypodensity in the pancreatic neck, no main duct dilatation, "small bowel loops with irregular mural thickening, likely serosal implants resulting in partial bowel obstruction/ileus", 10 mm peripancreatic lymph node, "non-specific mural thickening of the distal esophagus and gastric antrum/pylorus, which may be related to underdistention". He was treated with supportive care for SBO and referral to [facility] as outpatient was planned to be considered for oncologic surgery. Additionally, per chart review, patient family was  interested in receiving oncologic care at outside hospital, [facility]. Paracentesis was completed on 06/30/22 which showed a PMN count of 736, culture with no growth, he is currently on Merrem and Vancomycin. NG tube was able to be clamped on 11/25, removed 11/26 and tolerated clear liquid diet. Tachycardia on 11/26, lactic acidosis 11/27, required repeat NG tube placement on 11/27, episodes of emesis. CT scan of abdomen and pelvis on 11/27 showed multi lobar pneumonia. Repeat paracentesis on 11/27, gram stain with no organisms, no WBC seen. He required intubation on 11/28 given worsened respiratory status. Gastroenterology was consulted on 07/04/22, patient seen shortly after intubation. Unable to obtain review of systems as patient intubated and sedated.  No prior EGD.  Last colonoscopy completed 04/29/21 (Dr. Eusebio Friendly) at [facility] with findings of small polyp in sigmoid colon.   Past Medical History:  Diagnosis Date   G6PD deficiency    Hyperlipidemia    Hypertension    UTI (urinary tract infection)    Past Surgical History:  Procedure Laterality Date   LAPAROSCOPY N/A 07/02/2022   Procedure: LAPAROSCOPY DIAGNOSTIC;  Surgeon: Coralie Keens, MD;  Location: [facility] ORS;  Service: General;  Laterality: N/A;  with Biopsy of peritoneal nodules   URETHRA SURGERY     went in clean make sure urethra is clear   Prior to Admission medications   Medication Sig Start Date End Date Taking? Authorizing Provider  amLODipine-benazepril (LOTREL) 10-20 MG capsule Take 1 capsule by mouth daily. 09/08/21  Yes Nche, Charlene Brooke, NP  atorvastatin (LIPITOR) 40 MG tablet TAKE 1 TABLET DAILY AT 6PM Patient taking differently: Take 40 mg by mouth in the morning. 09/08/21  Yes Nche, Charlene Brooke, NP  ibuprofen (ADVIL) 200 MG tablet Take 400 mg by mouth every 6 (six) hours as needed for mild pain or headache.   Yes [provider]  LUMIFY 0.025 % SOLN Place 1 drop into both eyes every 8  (eight) hours as needed (for redness).   Yes [provider]  oxyCODONE-acetaminophen (PERCOCET/ROXICET) 5-325 MG tablet Take 1 tablet by mouth every 6 (six) hours as needed for severe pain.   Yes [provider]  tamsulosin (FLOMAX) 0.4 MG CAPS capsule Take 0.4 mg by mouth daily after breakfast. 06/05/17  Yes [provider]  sildenafil (REVATIO) 20 MG tablet Take 60-100 mg by mouth daily as needed (as directed). 06/07/17   [provider]   Current Facility-Administered Medications  Medication Dose Route Frequency Provider Last Rate Last Admin   0.45 % sodium chloride infusion   Intravenous Continuous Mercy Riding, MD   Stopped at 07/04/22 0639   0.9 %  sodium chloride infusion (Manually program via Guardrails IV Fluids)   Intravenous Once Lestine Mount, PA-C   Held at 07/04/22 1136   0.9 %  sodium chloride infusion   Intravenous Continuous Kathryne Eriksson, NP 50 mL/hr at 07/04/22 0900 Infusion Verify at 07/04/22 0900   acetaminophen (TYLENOL) tablet 650 mg  650 mg Oral Q6H PRN Mercy Riding, MD       Chlorhexidine Gluconate Cloth 2 % PADS 6 each  6 each Topical Daily Mercy Riding, MD   6 each at 07/03/22 0845   dexmedetomidine (PRECEDEX) 200 MCG/50ML (4 mcg/mL) infusion  0-1.2 mcg/kg/hr Intravenous Titrated Candee Furbish, MD   Held at 07/03/22 1945   fentaNYL (SUBLIMAZE) 100 MCG/2ML injection            fentaNYL (SUBLIMAZE) bolus via infusion 25-100 mcg  25-100 mcg Intravenous Q15 min PRN Lestine Mount, PA-C   50 mcg at 07/04/22 1440   fentaNYL 2512mg in NS 2568m(1038mml) infusion-PREMIX  25-200 mcg/hr Intravenous Continuous ReeLestine MountA-C 5 mL/hr at 07/04/22 1437 50 mcg/hr at 07/04/22 1437   heparin ADULT infusion 100 units/mL (25000 units/250m31m1,700 Units/hr Intravenous Continuous BellEudelia BunchH 17 mL/hr at 07/04/22 0900 1,700 Units/hr at 07/04/22 0900   hydrocortisone sodium succinate (SOLU-CORTEF) 100 MG injection 100 mg   100 mg Intravenous Q12H SmitCandee Furbish   100 mg at 07/04/22 1441   HYDROmorphone (DILAUDID) injection 0.5 mg  0.5 mg Intravenous Q3H PRN GonfWendee BeaversMD   0.5 mg at 07/04/22 1207   insulin aspart (novoLOG) injection 0-15 Units  0-15 Units Subcutaneous Q4H GonfWendee BeaversMD   2 Units at 07/04/22 0834   menthol-cetylpyridinium (CEPACOL) lozenge 3 mg  1 lozenge Oral PRN GonfWendee BeaversMD   3 mg at 07/03/22 1606   meropenem (MERREM) 1 g in sodium chloride 0.9 % 100 mL IVPB  1 g Intravenous Q8H Wofford, Drew A, RPH       micafungin (MYCAMINE) 100 mg in sodium chloride 0.9 % 100 mL IVPB  100 mg Intravenous Q24H SmitCandee Furbish       norepinephrine (LEVOPHED) '4mg'$  in 250mL78m016 mg/mL) premix infusion  0-40 mcg/min Intravenous Titrated ReeseNevada CraneA-C 131.3 mL/hr at 07/04/22 1400 35 mcg/min at 07/04/22 1400   oxyCODONE (Oxy IR/ROXICODONE) immediate release tablet 5 mg  5 mg Oral Q8H PRN GonfaWendee BeaversD       pantoprazole (PROTONIX) injection 40 mg  40 mg Intravenous Q12H ReeseNevada CraneA-C   40 mg at 07/04/22 1207   phenol (CHLORASEPTIC) mouth spray 1 spray  1 spray Mouth/Throat PRN Gonfa, Taye T,  MD   1 spray at 07/04/22 0310   prochlorperazine (COMPAZINE) injection 5 mg  5 mg Intravenous Q6H PRN Wendee Beavers T, MD       propofol (DIPRIVAN) 1000 MG/100ML infusion  0-50 mcg/kg/min Intravenous Continuous [place] Crane M, PA-C 2.29 mL/hr at 07/04/22 1436 5 mcg/kg/min at 07/04/22 1436   sodium chloride flush (NS) 0.9 % injection 10-40 mL  10-40 mL Intracatheter Q12H Wendee Beavers T, MD   10 mL at 07/04/22 0859   sodium chloride flush (NS) 0.9 % injection 10-40 mL  10-40 mL Intracatheter PRN Wendee Beavers T, MD   10 mL at 07/03/22 1729   TPN ADULT (ION)   Intravenous Continuous TPN Eudelia Bunch, RPH 85 mL/hr at 07/04/22 0900 Infusion Verify at 07/04/22 0900   TPN ADULT (ION)   Intravenous Continuous TPN Leodis Sias T, RPH       vancomycin (VANCOCIN) IVPB 1000 mg/200 mL premix   1,000 mg Intravenous Once Polly Cobia, RPH       vancomycin (VANCOREADY) IVPB 750 mg/150 mL  750 mg Intravenous Q12H Wofford, Drew A, RPH       vasopressin (PITRESSIN) 20 Units in sodium chloride 0.9 % 100 mL infusion-*FOR SHOCK*  0.04 Units/min Intravenous Continuous Candee Furbish, MD 12 mL/hr at 07/04/22 1439 0.04 Units/min at 07/04/22 1439   Allergies as of 06/14/2022   (No Known Allergies)   Family History  Problem Relation Age of Onset   Cancer Mother    Cancer Brother        liver cancer   Cancer Maternal Aunt        lung   Social History   Socioeconomic History   Marital status: Married    Spouse name: Not on file   Number of children: Not on file   Years of education: Not on file   Highest education level: Not on file  Occupational History   Not on file  Tobacco Use   Smoking status: Never   Smokeless tobacco: Never  Vaping Use   Vaping Use: Never used  Substance and Sexual Activity   Alcohol use: No   Drug use: No   Sexual activity: Yes  Other Topics Concern   Not on file  Social History Narrative   Not on file   Social Determinants of Health   Financial Resource Strain: Not on file  Food Insecurity: No Food Insecurity (06/26/2022)   Hunger Vital Sign    Worried About Running Out of Food in the Last Year: Never true    Ran Out of Food in the Last Year: Never true  Transportation Needs: Unknown (06/26/2022)   PRAPARE - Transportation    Lack of Transportation (Medical): Patient refused    Lack of Transportation (Non-Medical): Patient refused  Physical Activity: Not on file  Stress: Not on file  Social Connections: Not on file  Intimate Partner Violence: Not At Risk (06/26/2022)   Humiliation, Afraid, Rape, and Kick questionnaire    Fear of Current or Ex-Partner: No    Emotionally Abused: No    Physically Abused: No    Sexually Abused: No   Review of Systems:  Review of Systems  Unable to perform ROS: Intubated    OBJECTIVE:   Temp:  [98  F (36.7 C)-100.2 F (37.9 C)] 100.2 F (37.9 C) (11/28 1230) Pulse Rate:  [124-148] 139 (11/28 1315) Resp:  [26-53] 42 (11/28 1315) BP: (79-124)/(49-82) 79/55 (11/28 1315) SpO2:  [89 %-100 %] 96 % (11/28 1417)  FiO2 (%):  [80 %-100 %] 100 % (11/28 1417) Weight:  [76.4 kg] 76.4 kg (11/28 0500) Last BM Date : 07/01/22 Physical Exam Constitutional:      Appearance: He is ill-appearing.     Interventions: He is sedated and intubated.  Cardiovascular:     Rate and Rhythm: Regular rhythm. Tachycardia present.  Pulmonary:     Effort: No respiratory distress. He is intubated.     Comments: Intubated. Abdominal:     General: There is distension.     Comments: NG tube appreciated with dark bilious contents versus coffee ground emesis, some sediment. Decreased bowel sounds.  Skin:    General: Skin is warm and dry.     Labs: Recent Labs    07/03/22 0500 07/03/22 1855 07/04/22 0507  WBC 33.7* 25.6* 19.4*  HGB 10.5* 8.2* 7.3*  HCT 32.5* 25.4* 22.6*  PLT 333 255 246   BMET Recent Labs    07/03/22 0500 07/03/22 1855 07/04/22 0507  NA 140 140 146*  K 3.9 3.6 3.5  CL 104 103 107  CO2 '25 25 30  '$ GLUCOSE 144* 151* 142*  BUN 71* 65* 49*  CREATININE 2.22* 1.60* 1.01  CALCIUM 8.1* 8.0* 8.1*   LFT Recent Labs    07/04/22 0507  PROT 5.6*  ALBUMIN 2.6*  AST 29  ALT 30  ALKPHOS 79  BILITOT 2.8*   PT/INR No results for input(s): "LABPROT", "INR" in the last 72 hours.  Diagnostic imaging: DG CHEST PORT 1 VIEW  Result Date: 07/04/2022 CLINICAL DATA:  Intubation. EXAM: PORTABLE CHEST 1 VIEW COMPARISON:  07/03/2022 FINDINGS: The endotracheal tube is 2.6 cm above the carina. The NG tube is coursing down the esophagus and into the stomach. Small bilateral pleural effusions and slightly progressive bibasilar atelectasis. IMPRESSION: 1. Endotracheal tube and NG tube in good position. 2. Small bilateral pleural effusions and slightly progressive bibasilar infiltrates. Electronically  Signed   By: Marijo Sanes M.D.   On: 07/04/2022 14:28   CT ABDOMEN PELVIS WO CONTRAST  Result Date: 07/03/2022 CLINICAL DATA:  Peritoneal carcinomatosis with unclear primary source. Follow-up small bowel obstruction. Recent umbilical hernia surgery 06/08/2022. EXAM: CT ABDOMEN AND PELVIS WITHOUT CONTRAST TECHNIQUE: Multidetector CT imaging of the abdomen and pelvis was performed following the standard protocol without IV contrast. RADIATION DOSE REDUCTION: This exam was performed according to the departmental dose-optimization program which includes automated exposure control, adjustment of the mA and/or kV according to patient size and/or use of iterative reconstruction technique. COMPARISON:  CTA chest 07/02/2022, abdomen and pelvis CT with contrast 06/28/2022, and chest, abdomen and pelvis CT with contrast 06/24/2022. FINDINGS: Lower chest: There are increased small pleural effusions, increasing consolidation change in both lower lobes, and patchy infiltrates also increased in posterior lingula, right upper lobe base and right middle lobe. Findings consistent with multi lobar pneumonia and small parapneumonic effusions. The heart is slightly enlarged. There are coronary artery calcifications. Small pericardial effusion is similar to prior studies. An infusion catheter terminates in the right atrium. Hepatobiliary: The liver, gallbladder and bile ducts show no focal abnormality without contrast. No biliary dilatation. Pancreas: No focal abnormality is seen without contrast. Some of the abdominal ascitic fluid abuts the body of the pancreas. Spleen: Not well seen due to streak artifact from the patient's arms in the field and lack of IV contrast. It is not enlarged. The last study with contrast on November 22 demonstrated several small areas of hypoenhancement in the periphery of the spleen potentially representing peritoneal  surface lesions, but the spleen was unremarkable on November 18. I cannot tell if  this is present on this study without contrast and with streak artifacts through the spleen. Adrenals/Urinary Tract: There is residual renal cortical enhancement. It has been over 24 hours since the contrast dose from yesterday's CTA chest. Please correlate clinically for contrast nephrotoxicity. A 1.6 cm cyst anteriorly in the left kidney is again noted. There are a few tiny too small to characterize hypodensities in the kidneys with no follow-up imaging recommended. No adrenal mass is seen and no urinary stone obstruction. There is mild generalized bladder thickening versus nondistention with contrast in the bladder as well. Stomach/Bowel: NGT loops around within the stomach to the left with the tip just below the cardia. There is moderate debris and fluid within the stomach with gastric distention with air and fluid, distention of the duodenum, jejunum and at least the proximal 2/3 of the ileum with small-bowel dilatation up to 4.4 cm. There are multiple thickened small bowel segments interspersed with nonthickened segments and no single transitional segment could be identified. The small bowel in the lower abdominopelvic quadrants is decompressed and again noted moderately thickened, which could be due to serosal implants. Obstructive etiology could be distal small bowel infiltrating disease, enteritis, occult adhesions or occult internal hernia. Areas of likely peritoneal carcinomatosis of the hepatic and splenic flexures is again noted as well as moderate thickening in the distal sigmoid colon which could reflect changes due to colitis, underdistention or infiltrating disease. Vascular/Lymphatic: There is moderate aortoiliac calcific plaque, calcifications in the visceral branch vessel ostia. There is no AAA. There is no discrete adenopathy but there is evidence of diffuse peritoneal carcinomatosis with extensive omental and peritoneal disease with numerous nodular implants anteriorly. These findings were  noted previously but better seen with less ascites following 4.2 L paracentesis November 24. Reproductive: Enlarged prostate 5 cm transverse. Other: Mild-to-moderate abdominopelvic free ascites, less than previously. Body wall anasarca appears similar. There is a left inguinal fat hernia. There is no incarcerated hernia. Musculoskeletal: Bilateral mild-to-moderate hip DJD. Degenerative changes and bridging enthesopathy thoracic spine. Degenerative change lumbar spine. Acquired spinal stenosis L3-4, L4-5 and L5-S1 with ankylosis across the superior right SI joint. IMPRESSION: 1. Small-bowel obstruction with multiple thickened small bowel segments interspersed with nonthickened segments and no single transitional segment identified. Obstructive etiology could be due to infiltrating disease with irregular lower abdominal small bowel thickening possibly indicating serosal implants, or due to enteritis, occult adhesions or occult internal hernia. 2. Thickened distal sigmoid colon which could be due to colitis, underdistention or infiltrating disease. Additional peritoneal implants along the flexures of the colon. 3. Extensive peritoneal and omental carcinomatosis with mild-to-moderate free ascites, less than previously. 4. Increasing consolidation in both lower lobes and patchy infiltrates in the right upper lobe, right middle lobe and lingula. Increased small pleural effusions. 5. Body wall anasarca. 6. Residual bilateral renal cortical enhancement. Please correlate clinically for contrast nephrotoxicity. 7. Aortic and coronary artery atherosclerosis. 8. Prostatomegaly. 9. Left inguinal fat hernia. No incarcerated hernia. 10. Suboptimal visualization of the spleen due to streak artifact from the patient's arms in the field and lack of IV contrast. The spleen was unremarkable on November 06/24/2022, but on November 22 there were areas of peripheral hypoenhancement which were questioned as possibly due to peritoneal  surface lesions. Unable to determine on this study if this is still the case. 11. These results will be called to the ordering physician or representative by the radiologist  assistant, and communication documented in the PACS or Hotel manager. Aortic Atherosclerosis (ICD10-I70.0). Electronically Signed   By: Telford Nab M.D.   On: 07/03/2022 21:19   CT HEAD WO CONTRAST (5MM)  Result Date: 07/03/2022 CLINICAL DATA:  Stroke suspected EXAM: CT HEAD WITHOUT CONTRAST TECHNIQUE: Contiguous axial images were obtained from the base of the skull through the vertex without intravenous contrast. RADIATION DOSE REDUCTION: This exam was performed according to the departmental dose-optimization program which includes automated exposure control, adjustment of the mA and/or kV according to patient size and/or use of iterative reconstruction technique. COMPARISON:  None Available. FINDINGS: Brain: No evidence of acute infarction, hemorrhage, hydrocephalus, extra-axial collection or mass lesion/mass effect. Sequela of mild-to-moderate chronic microvascular ischemic change. Chronic mineralization of the basal ganglia on the right. Vascular: No hyperdense vessel or unexpected calcification. Skull: Normal. Negative for fracture or focal lesion. Sinuses/Orbits: No acute finding. Other: None. IMPRESSION: No acute intracranial abnormality. Electronically Signed   By: Marin Roberts M.D.   On: 07/03/2022 20:41   ECHOCARDIOGRAM COMPLETE  Result Date: 07/03/2022    ECHOCARDIOGRAM REPORT   Patient Name:   Bless L Rice Date of Exam: 07/03/2022 Medical Rec #:  [id number]        Height:       71.0 in Accession #:    [id number]       Weight:       168.0 lb Date of Birth:  12-Jun-1951        BSA:          1.958 m Patient Age:    73 years         BP:           94/47 mmHg Patient Gender: M                HR:           120 bpm. Exam Location:  Inpatient Procedure: 2D Echo, Cardiac Doppler and Color Doppler Indications:    R06.02 SOB.  Acute respiratory distress.  History:        Patient has no prior history of Echocardiogram examinations.                 Risk Factors:Hypertension and Dyslipidemia. Cancer.  Sonographer:    Roseanna Rainbow RDCS Referring Phys: [id number] Sylvester  Sonographer Comments: Technically difficult study due to poor echo windows. Patient in fowler's position. Very difficult angle and windows. IMPRESSIONS  1. Left ventricular ejection fraction, by estimation, is 60 to 65%. The left ventricle has normal function. The left ventricle has no regional wall motion abnormalities. Left ventricular diastolic parameters are indeterminate.  2. Mildly D-shaped right ventricle suggesting a degree of RV pressure/volume overload. Right ventricular systolic function is mildly reduced. The right ventricular size is mildly enlarged. Tricuspid regurgitation signal is inadequate for assessing PA pressure.  3. The mitral valve is normal in structure. No evidence of mitral valve regurgitation. No evidence of mitral stenosis.  4. The aortic valve is tricuspid. Aortic valve regurgitation is not visualized. No aortic stenosis is present.  5. The inferior vena cava is normal in size with greater than 50% respiratory variability, suggesting right atrial pressure of 3 mmHg.  6. A small pericardial effusion is present. The pericardial effusion is circumferential. FINDINGS  Left Ventricle: Left ventricular ejection fraction, by estimation, is 60 to 65%. The left ventricle has normal function. The left ventricle has no regional wall motion abnormalities. The left ventricular internal cavity size was normal  in size. There is  no left ventricular hypertrophy. Left ventricular diastolic parameters are indeterminate. Right Ventricle: Mildly D-shaped right ventricle suggesting a degree of RV pressure/volume overload. The right ventricular size is mildly enlarged. No increase in right ventricular wall thickness. Right ventricular systolic function is mildly  reduced. Tricuspid regurgitation signal is inadequate for assessing PA pressure. Left Atrium: Left atrial size was normal in size. Right Atrium: Right atrial size was normal in size. Pericardium: A small pericardial effusion is present. The pericardial effusion is circumferential. Mitral Valve: The mitral valve is normal in structure. No evidence of mitral valve regurgitation. No evidence of mitral valve stenosis. Tricuspid Valve: The tricuspid valve is normal in structure. Tricuspid valve regurgitation is trivial. Aortic Valve: The aortic valve is tricuspid. Aortic valve regurgitation is not visualized. No aortic stenosis is present. Pulmonic Valve: The pulmonic valve was not well visualized. Pulmonic valve regurgitation is not visualized. Aorta: The aortic root is normal in size and structure. Venous: The inferior vena cava is normal in size with greater than 50% respiratory variability, suggesting right atrial pressure of 3 mmHg. IAS/Shunts: No atrial level shunt detected by color flow Doppler.  LEFT VENTRICLE PLAX 2D LVIDd:         3.60 cm     Diastology LVIDs:         2.35 cm     LV e' medial:    9.46 cm/s LV PW:         1.20 cm     LV E/e' medial:  4.9 LV IVS:        1.10 cm     LV e' lateral:   9.03 cm/s LVOT diam:     2.40 cm     LV E/e' lateral: 5.1 LV SV:         60 LV SV Index:   30 LVOT Area:     4.52 cm  LV Volumes (MOD) LV vol d, MOD A2C: 41.9 ml LV vol d, MOD A4C: 38.0 ml LV vol s, MOD A2C: 15.4 ml LV vol s, MOD A4C: 12.0 ml LV SV MOD A2C:     26.5 ml LV SV MOD A4C:     38.0 ml LV SV MOD BP:      30.4 ml RIGHT VENTRICLE             IVC RV S prime:     18.70 cm/s  IVC diam: 2.00 cm TAPSE (M-mode): 1.8 cm LEFT ATRIUM             Index       RIGHT ATRIUM           Index LA diam:        3.10 cm 1.58 cm/m  RA Area:     10.70 cm LA Vol (A2C):   16.4 ml 8.38 ml/m  RA Volume:   17.90 ml  9.14 ml/m LA Vol (A4C):   15.4 ml 7.87 ml/m LA Biplane Vol: 16.1 ml 8.22 ml/m  AORTIC VALVE LVOT Vmax:   102.00 cm/s  LVOT Vmean:  62.700 cm/s LVOT VTI:    0.132 m  AORTA Ao Root diam: 3.30 cm Ao Asc diam:  3.00 cm MITRAL VALVE MV Area (PHT): 4.31 cm    SHUNTS MV Decel Time: 176 msec    Systemic VTI:  0.13 m MV E velocity: 46.00 cm/s  Systemic Diam: 2.40 cm MV A velocity: 46.40 cm/s MV E/A ratio:  0.99 MV A Prime:    9.6  cm/s Dalton McleanMD Electronically signed by Franki Monte Signature Date/Time: 07/03/2022/3:13:07 PM    Final    DG CHEST PORT 1 VIEW  Result Date: 07/03/2022 CLINICAL DATA:  Aspiration pneumonia EXAM: PORTABLE CHEST 1 VIEW COMPARISON:  07/03/2022 FINDINGS: Left-sided PICC line with the tip projecting over the SVC. Nasogastric tube with the tip projecting over the stomach. Hazy bibasilar airspace disease concerning for pneumonia. No pleural effusion or pneumothorax. Heart and mediastinal contours are unremarkable. No acute osseous abnormality. IMPRESSION: 1. Hazy bibasilar airspace disease concerning for pneumonia. Electronically Signed   By: Kathreen Devoid M.D.   On: 07/03/2022 08:52   DG CHEST PORT 1 VIEW  Result Date: 07/03/2022 CLINICAL DATA:  Possible aspiration following vomiting, initial encounter EXAM: PORTABLE CHEST 1 VIEW COMPARISON:  07/02/2022 FINDINGS: Cardiac shadow is stable. Left PICC is noted in satisfactory position. Gastric catheter is noted within the stomach. Bibasilar atelectatic changes are noted slightly increased when compared with the prior exam. These changes are similar to that seen on prior CT examination and could be related to aspiration. IMPRESSION: Bibasilar changes which may be related to aspiration. No other focal abnormality is noted. Electronically Signed   By: Inez Catalina M.D.   On: 07/03/2022 02:51   DG Abd Portable 1V  Result Date: 07/03/2022 CLINICAL DATA:  Check gastric catheter placement EXAM: PORTABLE ABDOMEN - 1 VIEW COMPARISON:  None Available. FINDINGS: Gastric catheter is coiled within the stomach. Scattered contrast is noted throughout the colon  from prior CT examination. No free air is noted. Contrast is seen within the bladder from recent contrast administration. IMPRESSION: Gastric catheter coiled within the stomach. Electronically Signed   By: Inez Catalina M.D.   On: 07/03/2022 02:50   CT Angio Chest Pulmonary Embolism (PE) W or WO Contrast  Result Date: 07/02/2022 CLINICAL DATA:  Pulmonary embolism suspected, high probability. Tachycardia, shortness of breath. EXAM: CT ANGIOGRAPHY CHEST WITH CONTRAST TECHNIQUE: Multidetector CT imaging of the chest was performed using the standard protocol during bolus administration of intravenous contrast. Multiplanar CT image reconstructions and MIPs were obtained to evaluate the vascular anatomy. RADIATION DOSE REDUCTION: This exam was performed according to the departmental dose-optimization program which includes automated exposure control, adjustment of the mA and/or kV according to patient size and/or use of iterative reconstruction technique. CONTRAST:  1m OMNIPAQUE IOHEXOL 350 MG/ML SOLN COMPARISON:  06/24/2022. FINDINGS: Cardiovascular: The heart is normal in size a few scattered coronary artery calcifications are noted. There is a trace pericardial effusion. There is mild atherosclerotic calcification of the aorta without evidence of aneurysm. Small residual thrombus in the left upper lobe. No new thrombus is identified. Evaluation is limited due to respiratory motion artifact. Mediastinum/Nodes: No mediastinal, hilar, or axillary lymphadenopathy. The thyroid gland is within normal limits. Debris is present in the trachea. The esophagus is distended with fluid. Lungs/Pleura: Debris is present in the right mainstem bronchus. There is bronchial wall thickening bilaterally with mucous plugging in the right lower lobe. Patchy airspace disease is noted in the right upper and middle lobes and lower lobes bilaterally. There is consolidation at the right lung base. No effusion or pneumothorax. An azygous  lobe is noted. Upper Abdomen: Large ascites in the upper abdomen. Musculoskeletal: Degenerative changes in the thoracic spine. No acute osseous abnormality. Review of the MIP images confirms the above findings. IMPRESSION: 1. Left upper lobe pulmonary embolism, not significantly changed from the prior exam. No new lesion is identified. 2. Bronchial wall thickening bilaterally with mucous plugging in  the right lower lobe. Scattered airspace opacities in the lungs bilaterally with consolidation at the right lung base, concerning for pneumonia. 3. Distended fluid-filled esophagus. Aspiration pneumonia should be included in the differential diagnosis. 4. Aortic atherosclerosis and coronary artery calcifications. 5. Large ascites. Electronically Signed   By: Brett Fairy M.D.   On: 07/02/2022 21:10   DG Chest Port 1 View  Result Date: 07/02/2022 CLINICAL DATA:  Lethargy with tachypnea and tachycardia. EXAM: PORTABLE CHEST 1 VIEW COMPARISON:  June 28, 2022 FINDINGS: There is stable left-sided PICC line positioning. The heart size and mediastinal contours are within normal limits. Low lung volumes are noted. Mild atelectasis is seen within the bilateral lung bases. There is no evidence of a pleural effusion or pneumothorax. Multilevel degenerative changes seen throughout the thoracic spine. IMPRESSION: Low lung volumes with mild bibasilar atelectasis. Electronically Signed   By: Virgina Norfolk M.D.   On: 07/02/2022 19:14    IMPRESSION: Peritoneal carcinomatosis  -Metastatic adenocarcinoma on biopsies, undetermined primary source -Moderate thickening of sigmoid colon on CT imaging 11/27 -Nonspecific mural thickening of distal esophagus on CT imaging 11/18 -Pancreatic neck hypodensity (5 mm) on CT imaging 11/18  -Personal history sigmoid colon polyp on last colonoscopy 04/2021  -Oncology following, plans were for outpatient referral to [facility] to consider oncologic surgery and [facility] for  medical oncology care, complicated by inpatient clinical decline Small bowel obstruction  -Likely secondary to #1  -NG tube in place, no bowel movement per nursing staff Peritonitis  -S/p paracentesis 11/24 and 11/27  -On meropenem and vancomycin Lactic acidosis Acute hypoxemic respiratory failure, required intubation 11/28 Normocytic anemia Multifocal pneumonia, suspected related to aspiration  Pulmonary embolism, deep venous thrombosis  -Heparin now on hold given anemia and dark emesis in NG tubing  PLAN: -Had in depth discussion with patient's entire available family this afternoon, 07/04/22. These discussions included his next of kin, wife and daughter. Discussed possibilities for EGD endoscopic evaluation to investigate dark emesis output, decreasing hemoglobin and to evaluate for potential primary source of peritoneal carcinomatosis. Ideally could complete colonoscopy to evaluate for primary source of peritoneal carcinomatosis, though given clinical picture with small bowel obstruction and acute illness, a colonoscopy does not seem feasible. -Discussed benefits of EGD, alternatives to procedure and risks of procedure, patient spouse and daughter wished to proceed when safe to do so. Recommend to monitor patient clinical course overnight as he is currently on vasopressor therapy and consider EGD 06/27/2022. Patient is currently NPO, heparin gtt has been discontinued per discussion with ICU staff. -Recommend IV PPI Q12Hr -Recommend trend H/H, transfuse for Hgb < 7 -Will evaluate patient clinical status 11/29 and determine if safe for endoscopy, family aware of plan, all questions answered   LOS: 13 days   Danton Clap, [facility]

## 2022-07-04 NOTE — Progress Notes (Signed)
Nutrition Follow-up  DOCUMENTATION CODES:   Non-severe (moderate) malnutrition in context of acute illness/injury  INTERVENTION:  - Continue goal TPN at this time: 108g protein (1.4g/kg), 326g protein (GIR 2.9), 612 lipids calories (28% of calories) which provides 2154 calories (28kcal/kg)   - Will need to closely monitor triglycerides. Last level today at 321  -TPN management per Pharmacy   -Recommend daily weights while on TPN    NUTRITION DIAGNOSIS:   Moderate Malnutrition related to acute illness (SBO following urethral surgery) as evidenced by energy intake < 75% for > 7 days, mild fat depletion, mild muscle depletion. *ongoing  GOAL:   Patient will meet greater than or equal to 90% of their needs *being met with TPN   MONITOR:   Diet advancement, Labs, Weight trends, I & O's (TPN)  REASON FOR ASSESSMENT:   Consult New TPN/TNA  ASSESSMENT:   71 y.o. with history of hypertension-on Lotrel 10-20, HLD on Lipitor 40,recurrent UTI BPH history of urethral surgery who apparently had umbilical hernia repair on November 2 and since then he has been having abdominal distention and not feeling well, and having nausea vomiting x2 days and abdominal distention and pain  11/15 admitted, NPO 11/16: s/p dx lap 11/21: TPN initiated  11/26: NG tube discontinued, diet adv to clear liquids - later in afternoon aspiration event and rapid respond called 11/27 transferred to ICU, NG replaced 11/28 Intubated in afternoon  Visited with patient and family this AM. Patient tolerating TPN. Family member had questions regarding high blood sugars. Discussed that insulin ordered and blood sugars are being monitored but have been well controlled the past few days. Triglycerides have been increasing over the past few days (189->321 today). Family report patient is usually on a medication to lower cholesterol at home that he has not been on since admission.  Patient and family hopeful he can eat  again soon. They understand TPN is providing nutrient needs at this time.  Later this afternoon patient required intubation. Surgery team at [facility] does not feel patient would be a surgical candidate a this time.    Medications reviewed and include: Insulin, K+ every 1hr x4 today Levophed  Labs reviewed:  Na 146 *TG 321  Blood Glucose    Diet Order:   Diet Order             Diet NPO time specified  Diet effective now                   EDUCATION NEEDS:  Education needs have been addressed  Skin:  Skin Assessment: Reviewed RN Assessment  Last BM:  11/19  Height:  Ht Readings from Last 1 Encounters:  07/03/22 _0  (1.803 m)   Weight:  Wt Readings from Last 1 Encounters:  07/04/22 76.4 kg    BMI:  Body mass index is 23.49 kg/m.  Estimated Nutritional Needs:  Kcal:  2100-2300 Protein:  95-110g Fluid:  2.1L/day    Samson Frederic RD, LDN For contact information, refer to [facility].

## 2022-07-04 NOTE — Progress Notes (Signed)
[place] Progress Note Patient Name: Cameron Rice DOB: [birth year] MRN: [id number]   Date of Service  07/04/2022  HPI/Events of Note  Notified of post BT hemoglobin of 6.9. With dark colored OGT drainage, pinkish urine.   eICU Interventions  Placed order to transfuse 1 more unit pRBC.         Anu Stagner M DELA CRUZ 07/04/2022, 11:06 PM

## 2022-07-04 NOTE — Progress Notes (Signed)
Chaplain engaged in an initial visit with Cameron Rice and his family at the bedside.  Chaplain offered prayer with them as they requested a prayer of salvation over him.  Chaplain provided support and a a spiritual care practice.     07/04/22 1600  Clinical Encounter Type  Visited With Patient  Visit Type Initial;Spiritual support  Referral From Chaplain;Family  Consult/Referral To Chaplain  Spiritual Encounters  Spiritual Needs Prayer

## 2022-07-04 NOTE — Progress Notes (Signed)
Notified Lab that ABG being sent for analysis. 

## 2022-07-04 NOTE — Progress Notes (Addendum)
PT Cancellation Note  Patient Details Name: Cameron Rice MRN: [id number] DOB: 08-28-1950   Cancelled Treatment:    Reason Eval/Treat Not Completed: Medical issues which prohibited therapy--Elevated HR, low BP, increased RR currently. Will hold PT for now. Will check back another day.    [facility]  Office: [phone]

## 2022-07-04 NOTE — Progress Notes (Addendum)
NAME:  Cameron Rice, MRN:  [id number], DOB:  [birth year], LOS: 87 ADMISSION DATE:  06/12/2022, CONSULTATION DATE:  07/03/2022 REFERRING MD:  Gershon Cull NP CHIEF COMPLAINT:  Acute resp failure with shock  History of Present Illness:  71 yo male with PMHx significant for HTN, hyperlipidemia, recurrent UTI and umbilical hernia repair (Nov 2) who presented to [facility] 11/15 with increasing abdominal distention and malaise. Patient reported 2 days of n/v as well as abdominal pain/distention since his repair. He also had not had a bowel movement since the operation. He originally was seen at [facility] where CT A/P revealed possible metastatic disease as well as SBO. He was admitted with general surgery consultation for SBO and suspected intraperitoneal carcinomatosis. He denied any blood in his vomit or stool. No fever/chills.   During this hospitalization he has been since gone back to the OR for diagnostic lap which did reveal peritoneal carcinomatosis (Oncology consulted) . He has had an intraoperative paracentesis as well as a subsequent one (11/24). On 11/27, patient again appeared to have large volume ascites. Over the course of his hospitalization he has been treated for SBO with NG decompression. He was also found to have a PE without associated DVT and was started on heparin gtt 11/18. Per surgical notes, he was having some improvement with passing of gas and small BM. His NG was discontinued morning of 11/26 after having tolerated clamping trial with clears. Unfortunately 11/26PM he required transfer to ICU after being noted to be tachycardic and hypotensive. Repeat CTA Chest 11/26 revealed no additional PE but aspiration as well as large volume ascites and a fluid filled esophagus. Upon exam by TRH, abdomen was quite distended and tympanic. NG was attempted to be placed on floor with subsequent vomiting and obvious aspiration.   Upon arrival to ICU patient remained tachycardic (140s) was given small  volume fluid resuscitation but remained hypotensive with SBP 70s, tachypneic and requiring HHFNC 100% and 40L. He states he is feeling some relief with the NG placement that just occurred and already as ~26m output of dark contents. CCM was consulted for hypotension and concern for need for airway. He is able to talk at this time is oriented and alert. He does have some conversational dyspnea. He states he would want intubation if absolutely necessary. He is agreeable to pressors and would like to remain a full code.   Pertinent Medical History:  HTN Hyperlipidemia Umbilical hernia s/p repair 06/08/22 Peritoneal carcinomatosis unknown primary  Significant Hospital Events: Including procedures, antibiotic start and stop dates in addition to other pertinent events   [facility] Ex lap with frozen sections + for adenocarcinoma, awaiting final path 11/18 Dx with PE started on heparin 11/19 Negative LE dopplers 11/26 Aspiration event  11/27 Transferred to ICU, NG replaced, ongoing tachycardia/hypotension. Delirium in PM prompting CT Head ([facility], CT A/P with SBO, multiple areas of bowel thickening/peritoneal implants, increasing BLL consolidation, anasarca.  11/28 Downtrending WBC/H&H, suspect component of dilution. Hgb 7.3, 1U PRBCs given. CCS/Oncology reengaged. [facility] transfer center contacted.  Interim History / Subjective:  Episode of confusion/delirium late evening prompting repeat labs/CT Head/CT A/P CT Head NAICA CT A/P with SBO, multiple areas of bowel thickening/peritoneal implants, increasing BLL lung consolidation, anasarca Remains tachy, mildly hypotensive with NE 446m WBC downtrending Hgb 7.3 this AM, dilution +/- gastric oozing? CT negative for bleed 1U PRBCs given Renal function improving  Objective:  Blood pressure 113/71, pulse (!) 131, temperature 98 F (36.7  C), temperature source Oral, resp. rate (!) 45, height '5\' 11"'$  (1.803 m), weight 76.4 kg,  SpO2 100 %.    FiO2 (%):  [80 %] 80 %   Intake/Output Summary (Last 24 hours) at 07/04/2022 0732 Last data filed at 07/04/2022 3382 Gross per 24 hour  Intake 4387.67 ml  Output 3550 ml  Net 837.67 ml    Filed Weights   07/02/22 0700 07/03/22 0815 07/04/22 0500  Weight: 80.8 kg 76.2 kg 76.4 kg   Physical Examination: General: Acutely ill-appearing middle-aged man in NAD. Appears HEENT: [place]/AT, anicteric sclera, PERRL, moist mucous membranes. NGT in place with dark brown output. Neuro: Awake, oriented x 3-4. Intermittent confusion/delirium. Responds to verbal stimuli. Following commands consistently. Moves all 4 extremities spontaneously. Generalized weakness. CV: Tachycardic to 140s, regular rhythm, no m/g/r. PULM: Breathing short and tachypneic on HHFNC (FiO2 80%, 40L). Mild conversational dyspnea. Lung fields diminished throughout. Splinting 2/2 abdominal distention. GI: Firm but compressible, moderately distended, nontender to palpation. Hypoactive bowel sounds. Extremities: No significant LE edema noted. PICC to LUE. Skin: Warm/dry, no rashes.  Resolved Hospital Problem List:    Assessment & Plan:  Septic shock secondary to aspiration Lactic acidosis - Remains in ICU for close monitoring - Goal MAP > 65 - Fluid resuscitation as tolerated, has received significant volume (net even today, 11/28) - Levophed titrated to goal MAP - Trend WBC, fever curve, LA - F/u Cx data - Continue broad-spectrum antibiotics (Zosyn)  Acute hypoxic resp failure PE, acute Aspiration pneumonia Echo 11/27 with LVEF 60-65%, mildly D-shaped septum c/f RV pressure/volume overload. - Continue supplemental O2 support - HHFNC - Not a BiPAP candidate due to SBO/aspiration risk; remains high risk for intubation - Wean FiO2 for O2 sat > 90% - Pulmonary hygiene - Zosyn coverage as above - Heparin gtt for PE, holding 11/28 in the setting of Hgb drift  Peritoneal carcinomatosis Ascites, malignant  in nature Bacterial peritonitis SBO Path/cyto with moderately differentiated adenocarcinoma. Surgery/Oncology following. CT A/P 11/27 now demonstrating SBO, multiple areas of bowel thickening/peritoneal implants, increasing BLL consolidation, anasarca.  - NPO status - NGT - TPN for nutritional support - Paracentesis PRN (last 11/27, 2.5L removed; 457 PMNs c/f bacterial peritonitis) - Albumin 25% post-para - Oncology/Surgery re-engaged 11/28 - [facility] transfer center contacted for surgical consultation/image review and possible transfer per family's wishes; concern there is no viable surgical options  Anemia, multifactorial Hgb drift 11/27-11/28, CT Head/CT A/P without e/o active bleeding. NGT output darker. - Trend H&H - Monitor for signs of active bleeding - Transfuse for Hgb < 7.0 or hemodynamically significant bleeding - 1U PRBCs 11/28 - PPI BID  ARF secondary to ATN, prerenal AKI Cr uptrending (2.22), baseline 0.8. Multifactorial in the setting of shock/hypotension and poor PO intake/GI losses. - Trend BMP, Cr improving - Replete electrolytes as indicated - Monitor I&Os - Avoid nephrotoxic agents as able - Ensure adequate renal perfusion  GOC Ongoing GOC discussion necessary in the setting of poor prognosis with widespread peritoneal carcinomatosis (moderately differentiated adeno with unknown primary) - Contact with [facility] made for surgical/oncologic evaluation and possible transfer; however, patient remains too unstable to transport - Uncertain that [facility] will be able to offer any additional surgical interventions that will impact Cameron Rice's quality or quantity of life; will request evaluation of images by their surgical service - Oncology to see today 11/28 to assess if any interventions are available, though this is unlikely given Cameron Rice's fragile, tenuous clinical status; not sure he would survive chemotherapeutic initiation at  this juncture - Continue full scope of care at  present - Low threshold for PMT involvement, pending further surgery/oncology input  Best Practice: (right click and "Reselect all SmartList Selections" daily)   Diet/type: TPN DVT prophylaxis: systemic heparin, held 11/28AM given Hgb drift GI prophylaxis: N/A Lines: Central line Foley:  Yes, and it is still needed Code Status:  full code Last date of multidisciplinary goals of care discussion [11/27AM with patient, 11/28 with family]  Critical care time: 35 minutes   Lestine Mount, [facility] 07/04/22 7:32 AM  Please see [email or web address] for pager details.  From 7A-7P if no response, please call [phone] After hours, please call ELink [phone]

## 2022-07-05 ENCOUNTER — Encounter (HOSPITAL_COMMUNITY): Payer: Self-pay | Admitting: Family Medicine

## 2022-07-05 ENCOUNTER — Encounter (HOSPITAL_COMMUNITY): Admission: EM | Disposition: E | Payer: Self-pay | Source: Home / Self Care | Attending: Internal Medicine

## 2022-07-05 ENCOUNTER — Inpatient Hospital Stay (HOSPITAL_COMMUNITY): Payer: BC Managed Care – PPO

## 2022-07-05 DIAGNOSIS — K56609 Unspecified intestinal obstruction, unspecified as to partial versus complete obstruction: Secondary | ICD-10-CM | POA: Diagnosis not present

## 2022-07-05 HISTORY — PX: BIOPSY: SHX5522

## 2022-07-05 HISTORY — PX: ESOPHAGOGASTRODUODENOSCOPY: SHX5428

## 2022-07-05 LAB — BLOOD GAS, ARTERIAL
Acid-Base Excess: 3.2 mmol/L — ABNORMAL HIGH (ref 0.0–2.0)
Bicarbonate: 26.9 mmol/L (ref 20.0–28.0)
Drawn by: 560031
FIO2: 40 %
MECHVT: 600 mL
O2 Saturation: 99.9 %
PEEP: 8 cmH2O
Patient temperature: 37
RATE: 20 resp/min
pCO2 arterial: 37 mmHg (ref 32–48)
pH, Arterial: 7.47 — ABNORMAL HIGH (ref 7.35–7.45)
pO2, Arterial: 79 mmHg — ABNORMAL LOW (ref 83–108)

## 2022-07-05 LAB — CBC
HCT: 22.3 % — ABNORMAL LOW (ref 39.0–52.0)
HCT: 21.9 % — ABNORMAL LOW (ref 39.0–52.0)
Hemoglobin: 7.7 g/dL — ABNORMAL LOW (ref 13.0–17.0)
Hemoglobin: 7.5 g/dL — ABNORMAL LOW (ref 13.0–17.0)
MCH: 30.7 pg (ref 26.0–34.0)
MCH: 30.5 pg (ref 26.0–34.0)
MCHC: 34.5 g/dL (ref 30.0–36.0)
MCHC: 34.2 g/dL (ref 30.0–36.0)
MCV: 88.8 fL (ref 80.0–100.0)
MCV: 89 fL (ref 80.0–100.0)
Platelets: 128 10*3/uL — ABNORMAL LOW (ref 150–400)
Platelets: 128 10*3/uL — ABNORMAL LOW (ref 150–400)
RBC: 2.51 MIL/uL — ABNORMAL LOW (ref 4.22–5.81)
RBC: 2.46 MIL/uL — ABNORMAL LOW (ref 4.22–5.81)
RDW: 18.3 % — ABNORMAL HIGH (ref 11.5–15.5)
RDW: 19 % — ABNORMAL HIGH (ref 11.5–15.5)
WBC: 29.9 10*3/uL — ABNORMAL HIGH (ref 4.0–10.5)
WBC: 37.5 10*3/uL — ABNORMAL HIGH (ref 4.0–10.5)
nRBC: 12 % — ABNORMAL HIGH (ref 0.0–0.2)
nRBC: 9.6 % — ABNORMAL HIGH (ref 0.0–0.2)

## 2022-07-05 LAB — ANAEROBIC CULTURE W GRAM STAIN

## 2022-07-05 LAB — BASIC METABOLIC PANEL
Anion gap: 7 (ref 5–15)
BUN: 52 mg/dL — ABNORMAL HIGH (ref 8–23)
CO2: 26 mmol/L (ref 22–32)
Calcium: 7.6 mg/dL — ABNORMAL LOW (ref 8.9–10.3)
Chloride: 107 mmol/L (ref 98–111)
Creatinine, Ser: 1.07 mg/dL (ref 0.61–1.24)
GFR, Estimated: >60 mL/min (ref >60)
Glucose, Bld: 222 mg/dL — ABNORMAL HIGH (ref 70–99)
Potassium: 4.3 mmol/L (ref 3.5–5.1)
Sodium: 140 mmol/L (ref 135–145)

## 2022-07-05 LAB — GLUCOSE, CAPILLARY
Glucose-Capillary: 149 mg/dL — ABNORMAL HIGH (ref 70–99)
Glucose-Capillary: 162 mg/dL — ABNORMAL HIGH (ref 70–99)
Glucose-Capillary: 166 mg/dL — ABNORMAL HIGH (ref 70–99)
Glucose-Capillary: 174 mg/dL — ABNORMAL HIGH (ref 70–99)
Glucose-Capillary: 195 mg/dL — ABNORMAL HIGH (ref 70–99)
Glucose-Capillary: 228 mg/dL — ABNORMAL HIGH (ref 70–99)

## 2022-07-05 LAB — TRIGLYCERIDES
Triglycerides: 922 mg/dL — ABNORMAL HIGH (ref <150)
Triglycerides: 961 mg/dL — ABNORMAL HIGH (ref <150)

## 2022-07-05 LAB — PREPARE RBC (CROSSMATCH)

## 2022-07-05 SURGERY — EGD (ESOPHAGOGASTRODUODENOSCOPY)
Anesthesia: Moderate Sedation

## 2022-07-05 MED ORDER — DEXMEDETOMIDINE HCL IN NACL 200 MCG/50ML IV SOLN
0.0000 ug/kg/h | INTRAVENOUS | Status: DC
  Administered 2022-07-05: 0.4 ug/kg/h via INTRAVENOUS
  Administered 2022-07-05: 0.6 ug/kg/h via INTRAVENOUS
  Administered 2022-07-05: 0.5 ug/kg/h via INTRAVENOUS
  Administered 2022-07-06: 1 ug/kg/h via INTRAVENOUS
  Administered 2022-07-06: 0.4 ug/kg/h via INTRAVENOUS
  Administered 2022-07-06: 0.9 ug/kg/h via INTRAVENOUS
  Administered 2022-07-06: 1.1 ug/kg/h via INTRAVENOUS
  Administered 2022-07-06: 0.7 ug/kg/h via INTRAVENOUS
  Filled 2022-07-05 (×8): qty 50

## 2022-07-05 MED ORDER — TRAVASOL 10 % IV SOLN
INTRAVENOUS | Status: AC
  Filled 2022-07-05: qty 1081.2

## 2022-07-05 MED ORDER — INSULIN ASPART 100 UNIT/ML IJ SOLN
0.0000 [IU] | INTRAMUSCULAR | Status: DC
  Administered 2022-07-05 (×3): 4 [IU] via SUBCUTANEOUS
  Administered 2022-07-06 (×2): 3 [IU] via SUBCUTANEOUS
  Administered 2022-07-06: 4 [IU] via SUBCUTANEOUS
  Administered 2022-07-06 (×2): 3 [IU] via SUBCUTANEOUS
  Administered 2022-07-07: 4 [IU] via SUBCUTANEOUS
  Administered 2022-07-07 (×2): 3 [IU] via SUBCUTANEOUS
  Administered 2022-07-07 (×2): 4 [IU] via SUBCUTANEOUS
  Administered 2022-07-08: 3 [IU] via SUBCUTANEOUS
  Administered 2022-07-08: 4 [IU] via SUBCUTANEOUS
  Administered 2022-07-08 – 2022-07-09 (×7): 3 [IU] via SUBCUTANEOUS
  Administered 2022-07-09: 4 [IU] via SUBCUTANEOUS
  Administered 2022-07-09 – 2022-07-14 (×17): 3 [IU] via SUBCUTANEOUS

## 2022-07-05 MED ORDER — FUROSEMIDE 10 MG/ML IJ SOLN
20.0000 mg | Freq: Once | INTRAMUSCULAR | Status: AC
  Administered 2022-07-05: 20 mg via INTRAVENOUS
  Filled 2022-07-05: qty 2

## 2022-07-05 MED ORDER — MIDAZOLAM HCL 2 MG/2ML IJ SOLN
1.0000 mg | INTRAMUSCULAR | Status: DC | PRN
  Administered 2022-07-05: 2 mg via INTRAVENOUS
  Administered 2022-07-06: 1 mg via INTRAVENOUS
  Administered 2022-07-07 – 2022-07-12 (×12): 2 mg via INTRAVENOUS
  Filled 2022-07-05 (×14): qty 2

## 2022-07-05 MED ORDER — MIDAZOLAM HCL 2 MG/2ML IJ SOLN
INTRAMUSCULAR | Status: AC
  Administered 2022-07-05: 2 mg
  Filled 2022-07-05: qty 2

## 2022-07-05 NOTE — Progress Notes (Addendum)
PHARMACY - TOTAL PARENTERAL NUTRITION CONSULT NOTE   Indication: SBO with Prolonged ileus  Patient Measurements: Height: _0  (180.3 cm) Weight: 79.9 kg (176 lb 2.4 oz) IBW/kg (Calculated) : 75.3 TPN AdjBW (KG): 76.2 Body mass index is 24.57 kg/m. Usual Weight:    Assessment:  Post operative abdominal pain with ascites after umbilical hernia repair with mesh on 06/08/22.   On 11/16 he had Lap biopsy and ascites aspiration.  Now has ileus with SBO.  Glucose / Insulin: No h/o DM noted. A1C 4.8.  11/28 hydrocortisone 100 mg IV q12 added - CBGs elevated after stress steroids added, were < 180 prior CBGs 100- 304 - mod SSI used: 28 units last 24h> will incr to resistant SSI Electrolytes: Na 140 (none in TPN), K 4.3 after 4 runs yesterday, Mag 2.1 (11/28) , Phos 3.2 on 11/28  (goal K>=4 and Mg>=2 with ileus.) CoCa 8.72 sl low.  Renal: h/o BPH (holding Flomax) SCr 1.07, BUN 52,  Hepatic: AST/ALT/Alk phos: OK.  Tbili 2.8 up, no jaundice on 11/27; Triglycerides 189> 243> 321>> 922 today w/ addition of propofol yesterday- repeat Trig 961>change propofol to precedex. Albumin 2.6 Intake / Output; MIVF:  Output:  NG 960 mls/24 hrs, UOP 1175 mls/24hr, LBM 11/28 mIVF: NS @ 50 ml/hr- DC'd, TPN @ 85 ml/hr, 45NS 10 ml/hr 11/29 lasix 20 IV x 1 per CCM GI Imaging: 11/15: CT: Mildly dilated loops of small bowel in the left lower quadrant with scattered air-fluid levels, nonspecific for ileus versus early obstruction 11/17: Xray: ileus vs obstruction 11/18: CT:  peritoneal carcinomatosis,  partial bowel obstruction/ileus 11/27 CT: SBO, extensive peritoneal & omental carcinomatosis w/ free ascites (less than prev)  GI Surgeries / Procedures:  11/2 hernia repair 11/16 diagnostic laparoscopy  Central access: PICC 11/21 TPN start date: 11/21  Nutritional Goals: Goal TPN rate is 85 mL/hr to provide 108 g of protein and 2154 kcals per day)  RD Assessment: Estimated Needs Total Energy Estimated  Needs: 2100-2300 Total Protein Estimated Needs: 95-110g Total Fluid Estimated Needs: 2.1L/day  Current Nutrition:  NPO and TPN NG LIWS  Propofol @ 11.46 ml/hr provides ~ 303 kcal/24 hrs- to change to precedex due to elevated trig  Plan:  Continue TPN at goal rate 11m/hr at 1800.  Electrolytes in TPN: Na 0454m/L, K 55 mEq/L, Ca 54m29mL, Mg 2mE61m, and Phos 10mm20m. Cl:Ac 1:2 (Unable to keep 1:1 ratio with elyte doses) Add standard MVI and trace elements to TPN Change q4h moderate SSI to q4h resistant SSI, may need to add semglee to cover elevated CBGS due to stress steroids Propofol to transition to precedex; recheck Trig in AM IVF NS 50 ml/hr stopped by CCM. Lasix 20 IV x1 Monitor TPN labs on Mon/Thurs and PRN,   MicheEudelia Bunchrm.D Use secure chat for questions 07/06/2022 8:35 AM

## 2022-07-05 NOTE — Progress Notes (Signed)
13 Days Post-Op   Subjective/Chief Complaint: Intubated, sedated.  NG to LIWS. Remains on TPN. On Vaso and levo. Given 1 u pRBC overnight Wife at bedside.   Objective: Vital signs in last 24 hours: Temp:  [97.5 F (36.4 C)-100.4 F (38 C)] 98.6 F (37 C) (11/29 0801) Pulse Rate:  [91-162] 97 (11/29 0800) Resp:  [22-54] 24 (11/29 0800) BP: (79-172)/(15-145) 140/84 (11/29 0800) SpO2:  [87 %-100 %] 98 % (11/29 0801) Arterial Line BP: (76-197)/(49-78) 156/65 (11/29 0800) FiO2 (%):  [40 %-100 %] 40 % (11/29 0801) Weight:  [79.9 kg] 79.9 kg (11/29 0351) Last BM Date : 07/01/22  Intake/Output from previous day: 11/28 0701 - 11/29 0700 In: 6957.6 [I.V.:5155.2; Blood:676; NG/GT:90; IV Piggyback:1036.4] Out: 2135 [Urine:1175; Emesis/NG output:960] Intake/Output this shift: No intake/output data recorded.  Gen:  ill appearing male, laying in bed on ventilatory HEENT: NGT R nare w/ brown effluent  Pulm: ventilated respirations, rhonchi present Abd: firm, distended, overall non-tender GU: foley in place Ext: there is some edema of all extremities  Neuro: F/C on vent   Lab Results:  Recent Labs    07/04/22 1600 07/04/22 1830 06/12/2022 0523  WBC 26.8*  --  29.9*  HGB 6.9*  --  7.7*  HCT 20.9*  --  22.3*  PLT 218 201 128*   BMET Recent Labs    07/04/22 0507 07/02/2022 0523  NA 146* 140  K 3.5 4.3  CL 107 107  CO2 30 26  GLUCOSE 142* 222*  BUN 49* 52*  CREATININE 1.01 1.07  CALCIUM 8.1* 7.6*   PT/INR Recent Labs    07/04/22 1830  LABPROT 16.7*  INR 1.4*   ABG Recent Labs    07/04/22 1510 06/12/2022 0902  PHART 7.51* 7.47*  HCO3 33.5* 26.9    Studies/Results: DG CHEST PORT 1 VIEW  Result Date: 07/04/2022 CLINICAL DATA:  Central line placement EXAM: PORTABLE CHEST 1 VIEW COMPARISON:  Chest 07/04/2022 FINDINGS: Endotracheal tube in good position. Left jugular central venous catheter tip difficult to visualize but probably near the cavoatrial junction or  right atrium. Left arm PICC tip also in place entering the SVC with tip not well visualized. Mild thorax NG tube in the stomach Bibasilar airspace disease unchanged. Small pleural effusions unchanged. IMPRESSION: 1. Central venous catheter tip difficult to visualize but probably near the cavoatrial junction or right atrium. 2. Bibasilar airspace disease and small effusions unchanged. Electronically Signed   By: Franchot Gallo M.D.   On: 07/04/2022 16:54   DG CHEST PORT 1 VIEW  Result Date: 07/04/2022 CLINICAL DATA:  Intubation. EXAM: PORTABLE CHEST 1 VIEW COMPARISON:  07/03/2022 FINDINGS: The endotracheal tube is 2.6 cm above the carina. The NG tube is coursing down the esophagus and into the stomach. Small bilateral pleural effusions and slightly progressive bibasilar atelectasis. IMPRESSION: 1. Endotracheal tube and NG tube in good position. 2. Small bilateral pleural effusions and slightly progressive bibasilar infiltrates. Electronically Signed   By: Marijo Sanes M.D.   On: 07/04/2022 14:28   CT ABDOMEN PELVIS WO CONTRAST  Result Date: 07/03/2022 CLINICAL DATA:  Peritoneal carcinomatosis with unclear primary source. Follow-up small bowel obstruction. Recent umbilical hernia surgery 06/08/2022. EXAM: CT ABDOMEN AND PELVIS WITHOUT CONTRAST TECHNIQUE: Multidetector CT imaging of the abdomen and pelvis was performed following the standard protocol without IV contrast. RADIATION DOSE REDUCTION: This exam was performed according to the departmental dose-optimization program which includes automated exposure control, adjustment of the mA and/or kV according to patient size and/or  use of iterative reconstruction technique. COMPARISON:  CTA chest 07/02/2022, abdomen and pelvis CT with contrast 06/28/2022, and chest, abdomen and pelvis CT with contrast 06/24/2022. FINDINGS: Lower chest: There are increased small pleural effusions, increasing consolidation change in both lower lobes, and patchy infiltrates also  increased in posterior lingula, right upper lobe base and right middle lobe. Findings consistent with multi lobar pneumonia and small parapneumonic effusions. The heart is slightly enlarged. There are coronary artery calcifications. Small pericardial effusion is similar to prior studies. An infusion catheter terminates in the right atrium. Hepatobiliary: The liver, gallbladder and bile ducts show no focal abnormality without contrast. No biliary dilatation. Pancreas: No focal abnormality is seen without contrast. Some of the abdominal ascitic fluid abuts the body of the pancreas. Spleen: Not well seen due to streak artifact from the patient's arms in the field and lack of IV contrast. It is not enlarged. The last study with contrast on November 22 demonstrated several small areas of hypoenhancement in the periphery of the spleen potentially representing peritoneal surface lesions, but the spleen was unremarkable on November 18. I cannot tell if this is present on this study without contrast and with streak artifacts through the spleen. Adrenals/Urinary Tract: There is residual renal cortical enhancement. It has been over 24 hours since the contrast dose from yesterday's CTA chest. Please correlate clinically for contrast nephrotoxicity. A 1.6 cm cyst anteriorly in the left kidney is again noted. There are a few tiny too small to characterize hypodensities in the kidneys with no follow-up imaging recommended. No adrenal mass is seen and no urinary stone obstruction. There is mild generalized bladder thickening versus nondistention with contrast in the bladder as well. Stomach/Bowel: NGT loops around within the stomach to the left with the tip just below the cardia. There is moderate debris and fluid within the stomach with gastric distention with air and fluid, distention of the duodenum, jejunum and at least the proximal 2/3 of the ileum with small-bowel dilatation up to 4.4 cm. There are multiple thickened small  bowel segments interspersed with nonthickened segments and no single transitional segment could be identified. The small bowel in the lower abdominopelvic quadrants is decompressed and again noted moderately thickened, which could be due to serosal implants. Obstructive etiology could be distal small bowel infiltrating disease, enteritis, occult adhesions or occult internal hernia. Areas of likely peritoneal carcinomatosis of the hepatic and splenic flexures is again noted as well as moderate thickening in the distal sigmoid colon which could reflect changes due to colitis, underdistention or infiltrating disease. Vascular/Lymphatic: There is moderate aortoiliac calcific plaque, calcifications in the visceral branch vessel ostia. There is no AAA. There is no discrete adenopathy but there is evidence of diffuse peritoneal carcinomatosis with extensive omental and peritoneal disease with numerous nodular implants anteriorly. These findings were noted previously but better seen with less ascites following 4.2 L paracentesis November 24. Reproductive: Enlarged prostate 5 cm transverse. Other: Mild-to-moderate abdominopelvic free ascites, less than previously. Body wall anasarca appears similar. There is a left inguinal fat hernia. There is no incarcerated hernia. Musculoskeletal: Bilateral mild-to-moderate hip DJD. Degenerative changes and bridging enthesopathy thoracic spine. Degenerative change lumbar spine. Acquired spinal stenosis L3-4, L4-5 and L5-S1 with ankylosis across the superior right SI joint. IMPRESSION: 1. Small-bowel obstruction with multiple thickened small bowel segments interspersed with nonthickened segments and no single transitional segment identified. Obstructive etiology could be due to infiltrating disease with irregular lower abdominal small bowel thickening possibly indicating serosal implants, or due to  enteritis, occult adhesions or occult internal hernia. 2. Thickened distal sigmoid colon  which could be due to colitis, underdistention or infiltrating disease. Additional peritoneal implants along the flexures of the colon. 3. Extensive peritoneal and omental carcinomatosis with mild-to-moderate free ascites, less than previously. 4. Increasing consolidation in both lower lobes and patchy infiltrates in the right upper lobe, right middle lobe and lingula. Increased small pleural effusions. 5. Body wall anasarca. 6. Residual bilateral renal cortical enhancement. Please correlate clinically for contrast nephrotoxicity. 7. Aortic and coronary artery atherosclerosis. 8. Prostatomegaly. 9. Left inguinal fat hernia. No incarcerated hernia. 10. Suboptimal visualization of the spleen due to streak artifact from the patient's arms in the field and lack of IV contrast. The spleen was unremarkable on November 06/24/2022, but on November 22 there were areas of peripheral hypoenhancement which were questioned as possibly due to peritoneal surface lesions. Unable to determine on this study if this is still the case. 11. These results will be called to the ordering physician or representative by the radiologist assistant, and communication documented in the PACS or [facility] dashboard. Aortic Atherosclerosis (ICD10-I70.0). Electronically Signed   By: Telford Nab M.D.   On: 07/03/2022 21:19   CT HEAD WO CONTRAST (5MM)  Result Date: 07/03/2022 CLINICAL DATA:  Stroke suspected EXAM: CT HEAD WITHOUT CONTRAST TECHNIQUE: Contiguous axial images were obtained from the base of the skull through the vertex without intravenous contrast. RADIATION DOSE REDUCTION: This exam was performed according to the departmental dose-optimization program which includes automated exposure control, adjustment of the mA and/or kV according to patient size and/or use of iterative reconstruction technique. COMPARISON:  None Available. FINDINGS: Brain: No evidence of acute infarction, hemorrhage, hydrocephalus, extra-axial collection or mass  lesion/mass effect. Sequela of mild-to-moderate chronic microvascular ischemic change. Chronic mineralization of the basal ganglia on the right. Vascular: No hyperdense vessel or unexpected calcification. Skull: Normal. Negative for fracture or focal lesion. Sinuses/Orbits: No acute finding. Other: None. IMPRESSION: No acute intracranial abnormality. Electronically Signed   By: Marin Roberts M.D.   On: 07/03/2022 20:41   ECHOCARDIOGRAM COMPLETE  Result Date: 07/03/2022    ECHOCARDIOGRAM REPORT   Patient Name:   Olivier L [place]ique Date of Exam: 07/03/2022 Medical Rec #:  [id number]        Height:       71.0 in Accession #:    [id number]       Weight:       168.0 lb Date of Birth:  [birth year]        BSA:          1.958 m Patient Age:    33 years         BP:           94/47 mmHg Patient Gender: M                HR:           120 bpm. Exam Location:  Inpatient Procedure: 2D Echo, Cardiac Doppler and Color Doppler Indications:    R06.02 SOB. Acute respiratory distress.  History:        Patient has no prior history of Echocardiogram examinations.                 Risk Factors:Hypertension and Dyslipidemia. Cancer.  Sonographer:    Roseanna Rainbow RDCS Referring Phys: [id number] [place]  Sonographer Comments: Technically difficult study due to poor echo windows. Patient in fowler's position. Very difficult angle and windows. IMPRESSIONS  1. Left  ventricular ejection fraction, by estimation, is 60 to 65%. The left ventricle has normal function. The left ventricle has no regional wall motion abnormalities. Left ventricular diastolic parameters are indeterminate.  2. Mildly D-shaped right ventricle suggesting a degree of RV pressure/volume overload. Right ventricular systolic function is mildly reduced. The right ventricular size is mildly enlarged. Tricuspid regurgitation signal is inadequate for assessing PA pressure.  3. The mitral valve is normal in structure. No evidence of mitral valve regurgitation. No evidence of  mitral stenosis.  4. The aortic valve is tricuspid. Aortic valve regurgitation is not visualized. No aortic stenosis is present.  5. The inferior vena cava is normal in size with greater than 50% respiratory variability, suggesting right atrial pressure of 3 mmHg.  6. A small pericardial effusion is present. The pericardial effusion is circumferential. FINDINGS  Left Ventricle: Left ventricular ejection fraction, by estimation, is 60 to 65%. The left ventricle has normal function. The left ventricle has no regional wall motion abnormalities. The left ventricular internal cavity size was normal in size. There is  no left ventricular hypertrophy. Left ventricular diastolic parameters are indeterminate. Right Ventricle: Mildly D-shaped right ventricle suggesting a degree of RV pressure/volume overload. The right ventricular size is mildly enlarged. No increase in right ventricular wall thickness. Right ventricular systolic function is mildly reduced. Tricuspid regurgitation signal is inadequate for assessing PA pressure. Left Atrium: Left atrial size was normal in size. Right Atrium: Right atrial size was normal in size. Pericardium: A small pericardial effusion is present. The pericardial effusion is circumferential. Mitral Valve: The mitral valve is normal in structure. No evidence of mitral valve regurgitation. No evidence of mitral valve stenosis. Tricuspid Valve: The tricuspid valve is normal in structure. Tricuspid valve regurgitation is trivial. Aortic Valve: The aortic valve is tricuspid. Aortic valve regurgitation is not visualized. No aortic stenosis is present. Pulmonic Valve: The pulmonic valve was not well visualized. Pulmonic valve regurgitation is not visualized. Aorta: The aortic root is normal in size and structure. Venous: The inferior vena cava is normal in size with greater than 50% respiratory variability, suggesting right atrial pressure of 3 mmHg. IAS/Shunts: No atrial level shunt detected by  color flow Doppler.  LEFT VENTRICLE PLAX 2D LVIDd:         3.60 cm     Diastology LVIDs:         2.35 cm     LV e' medial:    9.46 cm/s LV PW:         1.20 cm     LV E/e' medial:  4.9 LV IVS:        1.10 cm     LV e' lateral:   9.03 cm/s LVOT diam:     2.40 cm     LV E/e' lateral: 5.1 LV SV:         60 LV SV Index:   30 LVOT Area:     4.52 cm  LV Volumes (MOD) LV vol d, MOD A2C: 41.9 ml LV vol d, MOD A4C: 38.0 ml LV vol s, MOD A2C: 15.4 ml LV vol s, MOD A4C: 12.0 ml LV SV MOD A2C:     26.5 ml LV SV MOD A4C:     38.0 ml LV SV MOD BP:      30.4 ml RIGHT VENTRICLE             IVC RV S prime:     18.70 cm/s  IVC diam: 2.00 cm TAPSE (M-mode): 1.8  cm LEFT ATRIUM             Index       RIGHT ATRIUM           Index LA diam:        3.10 cm 1.58 cm/m  RA Area:     10.70 cm LA Vol (A2C):   16.4 ml 8.38 ml/m  RA Volume:   17.90 ml  9.14 ml/m LA Vol (A4C):   15.4 ml 7.87 ml/m LA Biplane Vol: 16.1 ml 8.22 ml/m  AORTIC VALVE LVOT Vmax:   102.00 cm/s LVOT Vmean:  62.700 cm/s LVOT VTI:    0.132 m  AORTA Ao Root diam: 3.30 cm Ao Asc diam:  3.00 cm MITRAL VALVE MV Area (PHT): 4.31 cm    SHUNTS MV Decel Time: 176 msec    Systemic VTI:  0.13 m MV E velocity: 46.00 cm/s  Systemic Diam: 2.40 cm MV A velocity: 46.40 cm/s MV E/A ratio:  0.99 MV A Prime:    9.6 cm/s Dalton McleanMD Electronically signed by Franki Monte Signature Date/Time: 07/03/2022/3:13:07 PM    Final     Anti-infectives: Anti-infectives (From admission, onward)    Start     Dose/Rate Route Frequency Ordered Stop   07/04/22 2200  vancomycin (VANCOREADY) IVPB 750 mg/150 mL        750 mg 150 mL/hr over 60 Minutes Intravenous Every 12 hours 07/04/22 1458     07/04/22 1600  micafungin (MYCAMINE) 100 mg in sodium chloride 0.9 % 100 mL IVPB        100 mg 105 mL/hr over 1 Hours Intravenous Every 24 hours 07/04/22 1439     07/04/22 1600  vancomycin (VANCOCIN) IVPB 1000 mg/200 mL premix        1,000 mg 200 mL/hr over 60 Minutes Intravenous  Once 07/04/22  1458 07/04/22 1736   07/04/22 1545  meropenem (MERREM) 1 g in sodium chloride 0.9 % 100 mL IVPB        1 g 200 mL/hr over 30 Minutes Intravenous Every 8 hours 07/04/22 1458     07/03/22 0300  piperacillin-tazobactam (ZOSYN) IVPB 3.375 g  Status:  Discontinued        3.375 g 12.5 mL/hr over 240 Minutes Intravenous Every 8 hours 07/03/22 0222 07/04/22 1417   06/20/2022 0200  piperacillin-tazobactam (ZOSYN) IVPB 3.375 g  Status:  Discontinued       See Hyperspace for full Linked Orders Report.   3.375 g 12.5 mL/hr over 240 Minutes Intravenous Every 8 hours 07/03/2022 1849 06/27/22 1232   06/16/2022 1900  piperacillin-tazobactam (ZOSYN) IVPB 3.375 g       See Hyperspace for full Linked Orders Report.   3.375 g 100 mL/hr over 30 Minutes Intravenous  Once 06/28/2022 1849 06/17/2022 1940       Assessment/Plan: Post operative abdominal pain with ascites after umbilical hernia repair with mesh by Dr. Rosendo Gros 06/08/22  -POD# 13 s/p LAPAROSCOPY DIAGNOSTIC, BIOPSY OF OMENTAL AND PERITONEAL NODULES, ASPIRATION OF ASCITES FOR CYTOLOGY 11/16 Dr. Ninfa Linden - pathology and cytology resulted - moderately differentiated adenocarcinoma - CT C/A/P 11/18 showed chronic PE and right common iliac DVT - now on heparin gtt with no sign of bleeding - CT abdomen 11/18 w/ evidence of peritoneal carcinomatosis but no obvious sign of primary tumor.  No pulmonary tumor or mets noted. Repeat ct scan overnight shows new obstructive pattern (SBO), with multiple separate areas of bowel wall thickening, likely related to carcinomatosis.  - continue  NG to LIWS.  - unfortunately I do not think there are any surgical treatment options for this patient. It may not even be surgically possible to relieve his obstruction due to significant tumor burden with multiple areas of small bowel thickening concerning for tumor implantation. No definitive transition point on his CT scan but there appears to be some of his ileum that is decompressed. Given  his septic shock due to aspiration PNA and current hemodynamics he would need to improve medically prior to being a surgical candidate. Would continue decompression with NG tube.  Per chart review, CCM spoke to surgical oncology at [facility] (Dr. Crisoforo Oxford) who feels the patient may be a candidate for diverting ostomy.  In his current condition, the patient is not medically stable for transfer to another facility. The patient and his family were explicit that they want to exhaust all possible treatment modalities. GI considering upper endoscopy in attempt to locate a primary tumor.  - Appreciate CCM mgmt of acute respiratory failure, aspiration PNA, and acute renal failure (improved today)    ID - WBC 29; Zosyn 11/15-11/21, Zosyn 11/27-11/28, vanc and merrem 11/28 >> FEN - NPO, NG LIWS, TPN VTE - hep gtt Foley - d/c 11/17, replaced 11/28  HTN HLD BPH Hx UTI    Jill Alexanders 06/19/2022

## 2022-07-05 NOTE — Inpatient Diabetes Management (Signed)
Inpatient Diabetes Program Recommendations  AACE/ADA: New Consensus Statement on Inpatient Glycemic Control (2015)  Target Ranges:  Prepandial:   less than 140 mg/dL      Peak postprandial:   less than 180 mg/dL (1-2 hours)      Critically ill patients:  140 - 180 mg/dL   Lab Results  Component Value Date   GLUCAP 162 (H) 06/10/2022   HGBA1C 4.8 06/27/2022    Review of Glycemic Control  Latest Reference Range & Units 07/04/22 16:39 07/04/22 19:45 07/04/22 23:16 06/18/2022 03:09 06/14/2022 11:47  Glucose-Capillary 70 - 99 mg/dL 220 (H) 304 (H) 250 (H) 228 (H) 162 (H)  (H): Data is abnormally high  Diabetes history: No DM history Outpatient Diabetes medications: None Current orders for Inpatient glycemic control:  Novolog 0-20 units Q4H, Solucortef 100 mg Q12H, TPN at 85 ml/hr  Inpatient Diabetes Program Recommendations:    Has received a total of 31 units of Novolog in the last 24 hrs.  Hyperglycemia likely due to steroids.  Might consider:  Levemir 8 units BID (0.15 units x 79.9 kg) while receiving steroids.  Stop once steroids discontinued.    Will continue to follow while inpatient.  Thank you, Reche Dixon, MSN, [facility] Coordinator Inpatient Diabetes Program [phone] (team pager from 8a-5p)

## 2022-07-05 NOTE — Progress Notes (Signed)
PT Cancellation Note  Patient Details Name: Cameron Rice MRN: [id number] DOB: 01/13/1951   Cancelled Treatment:    Reason Eval/Treat Not Completed: Medical issues which prohibited therapy, intubated.  [facility] Office [phone] Weekend [phone]    Claretha Cooper 06/07/2022, 6:49 AM

## 2022-07-05 NOTE — Progress Notes (Signed)
NAME:  Cameron Rice, MRN:  [id number], DOB:  07-31-1951, LOS: 18 ADMISSION DATE:  06/09/2022, CONSULTATION DATE:  07/03/2022 REFERRING MD:  Gershon Cull NP CHIEF COMPLAINT:  Acute resp failure with shock  History of Present Illness:  71 y/o male with PMHx significant for HTN, hyperlipidemia, recurrent UTI and umbilical hernia repair (Nov 2) who presented to [facility] 11/15 with increasing abdominal distention and malaise. Patient reported 2 days of n/v as well as abdominal pain/distention since his repair. He also had not had a bowel movement since the operation. He originally was seen at [facility] where CT A/P revealed possible metastatic disease as well as SBO. He was admitted with general surgery consultation for SBO and suspected intraperitoneal carcinomatosis. He denied any blood in his vomit or stool. No fever/chills.   During this hospitalization he has been since gone back to the OR for diagnostic lap which did reveal peritoneal carcinomatosis (Oncology consulted). He has had an intraoperative paracentesis as well as a subsequent one (11/24). On 11/27, patient again appeared to have large volume ascites. Over the course of his hospitalization he has been treated for SBO with NG decompression. He was also found to have a PE without associated DVT and was started on heparin gtt 11/18. Per surgical notes, he was having some improvement with passing of gas and small BM. His NG was discontinued morning of 11/26 after having tolerated clamping trial with clears. Unfortunately 11/26 PM he required transfer to ICU after being noted to be tachycardic and hypotensive. Repeat CTA Chest 11/26 revealed no additional PE but aspiration as well as large volume ascites and a fluid filled esophagus. Upon exam by TRH, abdomen was quite distended and tympanic. NG was attempted to be placed on floor with subsequent vomiting and obvious aspiration.   Upon arrival to ICU patient remained tachycardic (140s) was given small  volume fluid resuscitation but remained hypotensive with SBP 70s, tachypneic and requiring HHFNC 100% and 40L. He states he is feeling some relief with the NG placement that just occurred and already as ~213m output of dark contents. CCM was consulted for hypotension and concern for need for airway. He is able to talk at this time is oriented and alert. He does have some conversational dyspnea. He states he would want intubation if absolutely necessary. He is agreeable to pressors and would like to remain a full code.   Pertinent Medical History:  HTN Hyperlipidemia Umbilical hernia s/p repair 06/08/22 Peritoneal carcinomatosis unknown primary  Significant Hospital Events: Including procedures, antibiotic start and stop dates in addition to other pertinent events   [facility] Ex lap with frozen sections + for adenocarcinoma, awaiting final path 11/18 Dx with PE started on heparin 11/19 Negative LE dopplers 11/26 Aspiration event  11/27 Transferred to ICU, NG replaced, ongoing tachycardia/hypotension. Delirium in PM prompting CT Head ([facility], CT A/P with SBO, multiple areas of bowel thickening/peritoneal implants, increasing BLL consolidation, anasarca.  11/28 Downtrending WBC/H&H, suspect component of dilution. Hgb 7.3, 1U PRBCs given. CCS/Oncology reengaged. [facility] transfer center contacted. CT Head negative. CT abd/pelvis with SBO, multiple areas of bowel thickening/peritoneal implants, increasing BLL lung consolidation, anasarca. Confusion / delirium late evening.    Interim History / Subjective:  Afebrile    Objective:  Blood pressure 121/76, pulse 95, temperature (!) 97.5 F (36.4 C), temperature source Axillary, resp. rate (!) 24, height '5\' 11"'$  (1.803 m), weight 79.9 kg, SpO2 98 %.    Vent Mode: PRVC FiO2 (%):  [  40 %-100 %] 40 % Set Rate:  [20 bmp-24 bmp] 20 bmp Vt Set:  [600 mL] 600 mL PEEP:  [8 cmH20-10 cmH20] 8 cmH20 Plateau Pressure:  [22 cmH20-28  cmH20] 24 cmH20   Intake/Output Summary (Last 24 hours) at 06/17/2022 0831 Last data filed at 06/17/2022 0600 Gross per 24 hour  Intake 6957.59 ml  Output 2135 ml  Net 4822.59 ml   Filed Weights   07/03/22 0815 07/04/22 0500 06/27/2022 0351  Weight: 76.2 kg 76.4 kg 79.9 kg   Physical Exam: General: critically ill appearing adult male lying in bed in NAD on vent, family at bedside HEENT: MM pink/moist, ETT, anicteric, pupils =/reactive  Neuro: Awake, alert on sedation, follows commands, nods yes/no CV: s1s2 RRR, no m/r/g PULM: non-labored at rest, lungs bilaterally coarse with scattered rhonchi GI: soft, bsx4 active  Extremities: warm/dry, trace to 1+ generalized edema  Skin: no rashes or lesions Gtts: TPN, levo at 11 mcg, vaso, fentanyl 100 mcg, propofol 20 mcg  Respiratory culture > abundant GNR >>  BC x2 >> pending  Resolved Hospital Problem List:    Assessment & Plan:   Septic Shock secondary to Aspiration Lactic Acidosis -discontinue NS at 50 ml/hr -lasix 20 mg IV x1  -continue levophed for MAP >65 -follow fever curve / wbc trend  -continue zosyn, vancomycin, & micafungin  -consider stopping vanco pending tracheal aspirate  -continue stress dose steroids  -follow up pending cultures  Acute Hypoxic Respiratory Failure Acute PE  Aspiration Pneumonia Echo 11/27 with LVEF 60-65%, mildly D-shaped septum c/f RV pressure/volume overload. -PRVC with LTVV -wean PEEP / fiO2 for sats >90T -reduce rate to 20, ABG in 1 hour  -follow intermittent CXR  -abx as above  -SCD's, hold heparin infusion for now given Hgb drift/transfusion  Peritoneal Carcinomatosis Ascites, malignant in nature Bacterial peritonitis SBO Path/cyto with moderately differentiated adenocarcinoma. Surgery/Oncology following. CT A/P 11/27 now demonstrating SBO, multiple areas of bowel thickening/peritoneal implants, increasing BLL consolidation, anasarca.  -NPO  -continue NGT -appreciate CCS, ONC   -TPN for nutrition  -PRN paracentesis, last 11/28 with 2.5L removed, 457 PMN concerning for SBP) -[facility] contacted 11/28 > Dr. Crisoforo Oxford from surgical oncology [phone].  If can stabilize from resp/cardiac/renal they will consider diverting ostomy if persistent obs symptoms -pending EGD 11/29 per [facility]   Hypertriglyceridemia  On TPN, propofol  -repeat lab draw, ? false result -if remains elevated, will change propofol to precedex with PRN versed in an attempt to avoid prolonged benzos  Anemia, multifactorial Hgb drift 11/27-11/28, CT Head/CT A/P without e/o active bleeding. NGT output darker. -follow H/H  -transfuse for Hgb <7% or concern for active bleed -BID PPI  -follow NGT output > concern for slow bleeding   Acute Renal Failure secondary to ATN, prerenal AKI Cr uptrending (2.22), baseline 0.8. Multifactorial in the setting of shock/hypotension and poor PO intake/GI losses. Improved with support.  -Trend BMP / urinary output -Replace electrolytes as indicated -Avoid nephrotoxic agents, ensure adequate renal perfusion  GOC Ongoing GOC discussion necessary in the setting of poor prognosis with widespread peritoneal carcinomatosis (moderately differentiated adeno with unknown primary). -would need further stabilization before [facility] would consider intervention  -appreciate ONC evaluation  -continue full scope care per patient wishes  -family would want transfer to [facility] if accepted   Best Practice: (right click and "Reselect all SmartList Selections" daily)  Diet/type: TPN DVT prophylaxis: SCD, held systemic heparin 11/28 AM given Hgb drift GI prophylaxis: PPI Lines: Central line Foley:  Yes,  and it is still needed Code Status:  full code Last date of multidisciplinary goals of care discussion: 11/27AM with patient, 11/28 with family   Critical care time: 56 minutes    Noe Gens, MSN, APRN, NP-C, AGACNP-BC [facility] 06/23/2022, 8:52  AM   Please see [email or web address] for pager details.   From 7A-7P if no response, please call [phone] After hours, please call ELink [phone]

## 2022-07-05 NOTE — Procedures (Addendum)
Arterial Catheter Insertion Procedure Note  Orange L [place]  [id number]  [birth year]  Date:06/13/2022  Time:4:46 PM    Provider Performing: Candee Furbish    Procedure: Insertion of Arterial Line [phone]) with US guidance ([id number])   Indication(s) Blood pressure monitoring and/or need for frequent ABGs  Consent Unable to obtain consent due to emergent nature of procedure.  Anesthesia None   Time Out Verified patient identification, verified procedure, site/side was marked, verified correct patient position, special equipment/implants available, medications/allergies/relevant history reviewed, required imaging and test results available.   Sterile Technique Maximal sterile technique including full sterile barrier drape, hand hygiene, sterile gown, sterile gloves, mask, hair covering, sterile ultrasound probe cover (if used).   Procedure Description Area of catheter insertion was cleaned with chlorhexidine and draped in sterile fashion. With real-time ultrasound guidance an arterial catheter was placed into the right radial artery.  Appropriate arterial tracings confirmed on monitor.     Complications/Tolerance None; patient tolerated the procedure well.   EBL Minimal   Specimen(s) None

## 2022-07-05 NOTE — Progress Notes (Signed)
Assessed patient at bedside with ultrasound. Right lower abdomen examined with small volume fluid, bowel loops near skin. Not significant enough volume to perform thoracentesis.      Called [facility] transfer center. Confirmed patient is on transfer list. No ICU beds available. Family updated on status.       Cameron Gens, MSN, APRN, NP-C, AGACNP-BC [facility] 07/02/2022, 12:23 PM   Please see [email or web address] for pager details.   From 7A-7P if no response, please call [phone] After hours, please call ELink [phone]

## 2022-07-05 NOTE — Progress Notes (Signed)
RT NOTE:  ABG obtained and sent to lab, Freda Munro with lab notified.

## 2022-07-05 NOTE — Procedures (Addendum)
Central Venous Catheter Insertion Procedure Note  Cameron Rice  [id number]  17-Sep-1950  Date:07/04/22 Time:4:45 PM   Provider Performing:Kenzie Flakes Cipriano Mile   Procedure: Insertion of Non-tunneled Central Venous [phone]) with US guidance ([id number])   Indication(s) Medication administration  Consent Risks of the procedure as well as the alternatives and risks of each were explained to the patient and/or caregiver.  Consent for the procedure was obtained and is signed in the bedside chart  Anesthesia Topical only with 1% lidocaine   Timeout Verified patient identification, verified procedure, site/side was marked, verified correct patient position, special equipment/implants available, medications/allergies/relevant history reviewed, required imaging and test results available.  Sterile Technique Maximal sterile technique including full sterile barrier drape, hand hygiene, sterile gown, sterile gloves, mask, hair covering, sterile ultrasound probe cover (if used).  Procedure Description Area of catheter insertion was cleaned with chlorhexidine and draped in sterile fashion.  With real-time ultrasound guidance a central venous catheter was placed into the left internal jugular vein. Nonpulsatile blood flow and easy flushing noted in all ports.  The catheter was sutured in place and sterile dressing applied.  Complications/Tolerance None; patient tolerated the procedure well. Chest X-ray is ordered to verify placement for internal jugular or subclavian cannulation.   Chest x-ray is not ordered for femoral cannulation.  EBL Minimal  Specimen(s) None

## 2022-07-05 NOTE — Plan of Care (Signed)
  Problem: Skin Integrity: Goal: Risk for impaired skin integrity will decrease Outcome: Progressing   Problem: Respiratory: Goal: Ability to maintain a clear airway and adequate ventilation will improve Outcome: Progressing   Problem: Safety: Goal: Non-violent Restraint(s) Outcome: Not Progressing Note: Pt intubated and sedated. Restraints initiated to prevent pt from removing ETT and other essential lines.

## 2022-07-05 NOTE — Progress Notes (Signed)
BRIEF PROGRESS NOTE:   Had in-depth discussion with patient's wife, Levada Dy and daughter, Donney Dice, at bedside this a.m.  Patient remains intubated and on vasopressor therapy.  Hemoglobin 6.9 yesterday, received 1 unit PRBCs, hemoglobin 7.7 this a.m.  NG tube contents appear dark and bilious in nature, no further sediment noted.  Per discussion with nursing no bowel movements apart from clear mucus.  Hypertriglyceridemia, propofol being held by ICU team.  Lactic acidosis, 3.7 yesterday afternoon.  Vasopressor requirements improving, remains on vasopressin at same dose, Levophed requirements decreasing.  Discussed potential for EGD at bedside today in depth with patient's wife and daughter.  Discussed benefits, alternatives and risks of procedure.  Patient's wife and daughter wish to proceed with EGD.  Request that ICU team be bedside for procedure to modify vasopressor requirements during procedure if needed, agreeable.  All questions answered.  Danton Clap, DO [facility]

## 2022-07-05 NOTE — Progress Notes (Signed)
Called by RN to assess right hand - hand cool, & dusky.  Arterial line in right radial.   Plan: -discontinue aline now -assess doppler for arterial clot    Noe Gens, MSN, APRN, NP-C, AGACNP-BC [facility] 06/19/2022, 4:46 PM   Please see [email or web address] for pager details.   From 7A-7P if no response, please call [phone] After hours, please call ELink [phone]

## 2022-07-05 NOTE — Progress Notes (Signed)
OT Cancellation Note  Patient Details Name: Cameron Rice MRN: [id number] DOB: 05/08/1951   Cancelled Treatment:    Reason Eval/Treat Not Completed: Medical issues which prohibited therapy Patient was intubated yesterday PM hours for respiratory distress. OT to continue to follow and check back as schedule will allow.  Rennie Plowman, [facility] Office# [phone]  06/07/2022, 7:38 AM

## 2022-07-05 NOTE — Op Note (Signed)
[facility] Patient Name: Cameron Rice Procedure Date: 06/23/2022 MRN: [id number] Attending MD: Danton Clap DO, DO, [id number] Date of Birth: 09/18/1950 CSN: [id number] Age: 71 Admit Type: Inpatient Procedure:                Upper GI endoscopy Indications:              Iron deficiency anemia, Exclusion of tumor of the                            GI tract Providers:                Danton Clap DO, DO, Jaci Carrel, RN, Brien Mates, Technician Referring MD:              Medicines:                Sedation administration by ICU staff. Complications:            No immediate complications. Estimated blood loss:                            Minimal. Estimated Blood Loss:     Estimated blood loss was minimal. Procedure:                Pre-Anesthesia Assessment:                           - The anesthesia plan was to use moderate                            sedation/analgesia (conscious sedation).                           - Sedation monitored by ICU team and attending.                           After obtaining informed consent, the endoscope was                            passed under direct vision. Throughout the                            procedure, the patient's blood pressure, pulse, and                            oxygen saturations were monitored continuously. The                            [id number]-H190 ([id number]) Olympus endoscope was introduced                            through the mouth, and advanced to the second part                            of duodenum. The upper GI endoscopy was  technically                            difficult and complex due to a J-shaped stomach                            which made pyloric intubation difficult. Successful                            completion of the procedure was aided by using                            manual pressure. The upper GI endoscopy was                            technically  difficult and complex due to                            challenging esophageal intubation because of                            abnormal patient anatomy. Successful completion of                            the procedure was aided by deflation of ET tube                            balloon and reintubation of esophagus. The patient                            tolerated the procedure well. Scope In: Scope Out: Findings:      LA Grade C (one or more mucosal breaks continuous between tops of 2 or       more mucosal folds, less than 75% circumference) esophagitis with no       bleeding was found. No biopsies or other specimens were collected for       this exam.      The Z-line was irregular and was found 38 cm from the incisors.      Bilious fluid was found in the stomach. Fluid aspiration was performed.       No biopsies or other specimens were collected for this exam.      Diffuse mild mucosal changes characterized by friability (with contact       bleeding), granularity and nodularity were found on the greater       curvature of the stomach. Biopsies were taken with a cold forceps for       histology.      Three non-bleeding cratered gastric ulcers with a flat pigmented spot       (Forrest Class IIc) were found on the greater curvature of the stomach.       The largest lesion was 6 mm in largest dimension. Biopsies were taken       with a cold forceps for histology.      The examined duodenum was normal. No biopsies or other specimens were       collected for this exam. Impression:               -  LA Grade C esophagitis with no bleeding. No                            specimens collected.                           - Z-line irregular, 38 cm from the incisors.                           - Bilious gastric fluid. Fluid aspiration                            performed. No specimens collected.                           [name] (with contact bleeding), granular and                             nodular mucosa in the greater curvature. Biopsied.                           - Non-bleeding gastric ulcers with a flat pigmented                            spot (Forrest Class IIc). Biopsied.                           - Normal examined duodenum. No specimens collected. Moderate Sedation:      ICU staff administered sedation medications. Recommendation:           - Observe patient in ICU for ongoing care.                           - Replace NG tube.                           - Use Protonix (pantoprazole) 40 mg IV BID for 2                            months.                           - Observe patient's clinical course.                           - Repeat upper endoscopy in 1 month to check                            healing (pending clinical course).                           - Await pathology results. Procedure Code(s):        --- Professional ---                           [phone], Esophagogastroduodenoscopy, flexible,  transoral; with biopsy, single or multiple Diagnosis Code(s):        --- Professional ---                           K20.90, Esophagitis, unspecified without bleeding                           K92.2, Gastrointestinal hemorrhage, unspecified                           K25.9, Gastric ulcer, unspecified as acute or                            chronic, without hemorrhage or perforation CPT copyright 2022 [employer] Association. All rights reserved. The codes documented in this report are preliminary and upon coder review may  be revised to meet current compliance requirements. Dr Danton Clap, [place], DO 06/15/2022 3:35:54 PM Number of Addenda: 0

## 2022-07-06 ENCOUNTER — Inpatient Hospital Stay (HOSPITAL_COMMUNITY): Payer: BC Managed Care – PPO

## 2022-07-06 DIAGNOSIS — R209 Unspecified disturbances of skin sensation: Secondary | ICD-10-CM | POA: Diagnosis not present

## 2022-07-06 DIAGNOSIS — K56609 Unspecified intestinal obstruction, unspecified as to partial versus complete obstruction: Secondary | ICD-10-CM | POA: Diagnosis not present

## 2022-07-06 LAB — CBC
HCT: 22 % — ABNORMAL LOW (ref 39.0–52.0)
Hemoglobin: 7.4 g/dL — ABNORMAL LOW (ref 13.0–17.0)
MCH: 30.6 pg (ref 26.0–34.0)
MCHC: 33.6 g/dL (ref 30.0–36.0)
MCV: 90.9 fL (ref 80.0–100.0)
Platelets: 132 10*3/uL — ABNORMAL LOW (ref 150–400)
RBC: 2.42 MIL/uL — ABNORMAL LOW (ref 4.22–5.81)
RDW: 18.7 % — ABNORMAL HIGH (ref 11.5–15.5)
WBC: 40.1 10*3/uL — ABNORMAL HIGH (ref 4.0–10.5)
nRBC: 7.6 % — ABNORMAL HIGH (ref 0.0–0.2)

## 2022-07-06 LAB — BLOOD GAS, ARTERIAL
Acid-Base Excess: 2.2 mmol/L — ABNORMAL HIGH (ref 0.0–2.0)
Bicarbonate: 25.1 mmol/L (ref 20.0–28.0)
O2 Saturation: 99 %
Patient temperature: 37
pCO2 arterial: 33 mmHg (ref 32–48)
pH, Arterial: 7.49 — ABNORMAL HIGH (ref 7.35–7.45)
pO2, Arterial: 73 mmHg — ABNORMAL LOW (ref 83–108)

## 2022-07-06 LAB — BPAM RBC
Blood Product Expiration Date: 202312212359
Blood Product Expiration Date: 202312232359
ISSUE DATE / TIME: 202311281555
ISSUE DATE / TIME: 202311290022
Unit Type and Rh: 9500
Unit Type and Rh: 9500

## 2022-07-06 LAB — TYPE AND SCREEN
ABO/RH(D): O NEG
Antibody Screen: NEGATIVE
Unit division: 0
Unit division: 0

## 2022-07-06 LAB — COMPREHENSIVE METABOLIC PANEL
ALT: 33 U/L (ref 0–44)
AST: 45 U/L — ABNORMAL HIGH (ref 15–41)
Albumin: 1.9 g/dL — ABNORMAL LOW (ref 3.5–5.0)
Alkaline Phosphatase: 118 U/L (ref 38–126)
Anion gap: 6 (ref 5–15)
BUN: 45 mg/dL — ABNORMAL HIGH (ref 8–23)
CO2: 27 mmol/L (ref 22–32)
Calcium: 8 mg/dL — ABNORMAL LOW (ref 8.9–10.3)
Chloride: 107 mmol/L (ref 98–111)
Creatinine, Ser: 0.78 mg/dL (ref 0.61–1.24)
GFR, Estimated: >60 mL/min (ref >60)
Glucose, Bld: 160 mg/dL — ABNORMAL HIGH (ref 70–99)
Potassium: 4.6 mmol/L (ref 3.5–5.1)
Sodium: 140 mmol/L (ref 135–145)
Total Bilirubin: 3.2 mg/dL — ABNORMAL HIGH (ref 0.3–1.2)
Total Protein: 5.2 g/dL — ABNORMAL LOW (ref 6.5–8.1)

## 2022-07-06 LAB — GLUCOSE, CAPILLARY
Glucose-Capillary: 153 mg/dL — ABNORMAL HIGH (ref 70–99)
Glucose-Capillary: 120 mg/dL — ABNORMAL HIGH (ref 70–99)
Glucose-Capillary: 140 mg/dL — ABNORMAL HIGH (ref 70–99)
Glucose-Capillary: <10 mg/dL — CL (ref 70–99)
Glucose-Capillary: 139 mg/dL — ABNORMAL HIGH (ref 70–99)
Glucose-Capillary: 141 mg/dL — ABNORMAL HIGH (ref 70–99)
Glucose-Capillary: 137 mg/dL — ABNORMAL HIGH (ref 70–99)

## 2022-07-06 LAB — TRIGLYCERIDES: Triglycerides: 977 mg/dL — ABNORMAL HIGH (ref <150)

## 2022-07-06 LAB — SURGICAL PATHOLOGY

## 2022-07-06 LAB — PATHOLOGIST SMEAR REVIEW

## 2022-07-06 LAB — MAGNESIUM: Magnesium: 2.2 mg/dL (ref 1.7–2.4)

## 2022-07-06 LAB — PHOSPHORUS: Phosphorus: 2.3 mg/dL — ABNORMAL LOW (ref 2.5–4.6)

## 2022-07-06 LAB — HEPARIN LEVEL (UNFRACTIONATED): Heparin Unfractionated: 0.36 IU/mL (ref 0.30–0.70)

## 2022-07-06 MED ORDER — POLYETHYLENE GLYCOL 3350 17 G PO PACK: 17.0000 g | PACK | Freq: Every day | ORAL | Status: DC

## 2022-07-06 MED ORDER — DOCUSATE SODIUM 50 MG/5ML PO LIQD: 100.0000 mg | Freq: Two times a day (BID) | ORAL | Status: DC

## 2022-07-06 MED ORDER — HYDROCORTISONE SOD SUC (PF) 100 MG IJ SOLR
100.0000 mg | Freq: Every day | INTRAMUSCULAR | Status: DC
  Administered 2022-07-07: 100 mg via INTRAVENOUS
  Filled 2022-07-06 (×2): qty 2

## 2022-07-06 MED ORDER — FUROSEMIDE 10 MG/ML IJ SOLN
20.0000 mg | Freq: Once | INTRAMUSCULAR | Status: AC
  Administered 2022-07-06: 20 mg via INTRAVENOUS
  Filled 2022-07-06: qty 2

## 2022-07-06 MED ORDER — FENTANYL CITRATE PF 50 MCG/ML IJ SOSY
50.0000 ug | PREFILLED_SYRINGE | INTRAMUSCULAR | Status: DC | PRN
  Administered 2022-07-06 (×5): 100 ug via INTRAVENOUS
  Filled 2022-07-06 (×5): qty 2

## 2022-07-06 MED ORDER — FENTANYL BOLUS VIA INFUSION
25.0000 ug | INTRAVENOUS | Status: DC | PRN
  Administered 2022-07-06 – 2022-07-07 (×4): 100 ug via INTRAVENOUS
  Administered 2022-07-07: 50 ug via INTRAVENOUS
  Administered 2022-07-07 – 2022-07-08 (×8): 100 ug via INTRAVENOUS
  Administered 2022-07-08: 50 ug via INTRAVENOUS
  Administered 2022-07-08: 100 ug via INTRAVENOUS
  Administered 2022-07-08 (×2): 50 ug via INTRAVENOUS
  Administered 2022-07-09 (×2): 100 ug via INTRAVENOUS
  Administered 2022-07-09: 50 ug via INTRAVENOUS
  Administered 2022-07-09 (×3): 100 ug via INTRAVENOUS
  Administered 2022-07-10: 50 ug via INTRAVENOUS
  Administered 2022-07-10 – 2022-07-11 (×3): 100 ug via INTRAVENOUS
  Administered 2022-07-11: 50 ug via INTRAVENOUS
  Administered 2022-07-11 – 2022-07-12 (×8): 100 ug via INTRAVENOUS
  Administered 2022-07-13: 50 ug via INTRAVENOUS
  Administered 2022-07-13: 100 ug via INTRAVENOUS
  Administered 2022-07-13 (×2): 75 ug via INTRAVENOUS
  Administered 2022-07-13 – 2022-07-14 (×4): 100 ug via INTRAVENOUS

## 2022-07-06 MED ORDER — NOREPINEPHRINE 4 MG/250ML-% IV SOLN
0.0000 ug/min | INTRAVENOUS | Status: DC
  Administered 2022-07-06 – 2022-07-07 (×2): 2 ug/min via INTRAVENOUS
  Filled 2022-07-06 (×2): qty 250

## 2022-07-06 MED ORDER — TRAVASOL 10 % IV SOLN
INTRAVENOUS | Status: AC
  Filled 2022-07-06: qty 1081.2

## 2022-07-06 MED ORDER — FENTANYL 2500MCG IN NS 250ML (10MCG/ML) PREMIX INFUSION
25.0000 ug/h | INTRAVENOUS | Status: DC
  Administered 2022-07-06: 25 ug/h via INTRAVENOUS
  Administered 2022-07-07: 75 ug/h via INTRAVENOUS
  Administered 2022-07-08: 125 ug/h via INTRAVENOUS
  Administered 2022-07-09 – 2022-07-10 (×4): 200 ug/h via INTRAVENOUS
  Administered 2022-07-11: 125 ug/h via INTRAVENOUS
  Administered 2022-07-11: 200 ug/h via INTRAVENOUS
  Administered 2022-07-12 – 2022-07-13 (×3): 125 ug/h via INTRAVENOUS
  Administered 2022-07-14: 350 ug/h via INTRAVENOUS
  Filled 2022-07-06 (×14): qty 250

## 2022-07-06 MED ORDER — POLYETHYLENE GLYCOL 3350 17 G PO PACK
17.0000 g | PACK | Freq: Every day | ORAL | Status: DC
  Administered 2022-07-06 – 2022-07-10 (×2): 17 g
  Filled 2022-07-06 (×2): qty 1

## 2022-07-06 MED ORDER — DOCUSATE SODIUM 50 MG/5ML PO LIQD
100.0000 mg | Freq: Two times a day (BID) | ORAL | Status: DC
  Administered 2022-07-06: 100 mg
  Filled 2022-07-06: qty 10

## 2022-07-06 MED ORDER — SODIUM PHOSPHATES 45 MMOLE/15ML IV SOLN
25.0000 mmol | Freq: Once | INTRAVENOUS | Status: AC
  Administered 2022-07-06: 25 mmol via INTRAVENOUS
  Filled 2022-07-06: qty 8.33

## 2022-07-06 MED ORDER — HEPARIN (PORCINE) 25000 UT/250ML-% IV SOLN
1800.0000 [IU]/h | INTRAVENOUS | Status: DC
  Administered 2022-07-06 – 2022-07-13 (×12): 1700 [IU]/h via INTRAVENOUS
  Filled 2022-07-06 (×12): qty 250

## 2022-07-06 MED ORDER — DEXMEDETOMIDINE HCL IN NACL 400 MCG/100ML IV SOLN
0.0000 ug/kg/h | INTRAVENOUS | Status: DC
  Administered 2022-07-07: 1.1 ug/kg/h via INTRAVENOUS
  Administered 2022-07-07: 0.9 ug/kg/h via INTRAVENOUS
  Administered 2022-07-07: 1 ug/kg/h via INTRAVENOUS
  Filled 2022-07-06 (×3): qty 100

## 2022-07-06 NOTE — Progress Notes (Signed)
Cameron Rice   DOB:Mar 03, 1951   JM#:[id number]   QIW#:[id number]  Oncology follow up   Subjective: Pt underwent EGD yesterday. He is off pressor now, awake and follow commands. No BM, on NG suction.   Objective:  Vitals:   07/06/22 1800 07/06/22 1908  BP: (!) 80/63   Pulse: (!) 104   Resp: (!) 28   Temp:  98.9 F (37.2 C)  SpO2: 98%     Body mass index is 24.97 kg/m.  Intake/Output Summary (Last 24 hours) at 07/06/2022 2030 Last data filed at 07/06/2022 1800 Gross per 24 hour  Intake 3070.22 ml  Output 1685 ml  Net 1385.22 ml     Sclerae icteric  Pt is intubated but awake   No peripheral adenopathy  Heart regular rate and rhythm  Abdomen distended     CBG (last 3)  Recent Labs    07/06/22 1152 07/06/22 1548 07/06/22 1938  GLUCAP 140* 139* 141*     Labs:  Urine Studies No results for input(s): "UHGB", "CRYS" in the last 72 hours.  Invalid input(s): "UACOL", "UAPR", "USPG", "UPH", "UTP", "UGL", "UKET", "UBIL", "UNIT", "UROB", "ULEU", "UEPI", "UWBC", "URBC", "UBAC", "CAST", "UCOM", "BILUA"  Basic Metabolic Panel: Recent Labs  Lab 06/30/22 0508 07/02/22 0302 07/02/22 1918 07/03/22 0500 07/03/22 1855 07/04/22 0507 06/10/2022 0523 07/06/22 0002  NA 143 142   < > 140 140 146* 140 140  K 3.8 3.7   < > 3.9 3.6 3.5 4.3 4.6  CL 108 107   < > 104 103 107 107 107  CO2 29 25   < > _0 GLUCOSE 156* 131*   < > 144* 151* 142* 222* 160*  BUN 33* 34*   < > 71* 65* 49* 52* 45*  CREATININE 0.91 0.80   < > 2.22* 1.60* 1.01 1.07 0.78  CALCIUM 8.7* 8.2*   < > 8.1* 8.0* 8.1* 7.6* 8.0*  MG 2.3 1.9  --  1.9 1.8 2.1  --  2.2  PHOS 3.2 3.6  --  4.1  --  3.2  --  2.3*   < > = values in this interval not displayed.   GFR Estimated Creatinine Clearance: 92.8 mL/min (by C-G formula based on SCr of 0.78 mg/dL). Liver Function Tests: Recent Labs  Lab 07/02/22 1918 07/03/22 0500 07/03/22 1855 07/04/22 0507 07/06/22 0002  AST 40 41 31 29 45*  ALT 43 46* 36  30 33  ALKPHOS 125 126 92 79 118  BILITOT 1.6* 2.3* 2.7* 2.8* 3.2*  PROT 5.5* 5.5* 5.2* 5.6* 5.2*  ALBUMIN 1.8* 1.7* 2.4* 2.6* 1.9*   No results for input(s): "LIPASE", "AMYLASE" in the last 168 hours. Recent Labs  Lab 07/02/22 1918  AMMONIA 36*   Coagulation profile Recent Labs  Lab 07/04/22 1830  INR 1.4*    CBC: Recent Labs  Lab 07/02/22 0302 07/02/22 1918 07/03/22 0500 07/03/22 1855 07/04/22 0507 07/04/22 1600 07/04/22 1830 06/27/2022 0523 07/01/2022 1606 07/06/22 0002  WBC 41.3* 38.5* 33.7*   < > 19.4* 26.8*  --  29.9* 37.5* 40.1*  NEUTROABS 37.2* 34.7* 27.7*  --   --   --   --   --   --   --   HGB 11.9* 11.5* 10.5*   < > 7.3* 6.9*  --  7.7* 7.5* 7.4*  HCT 38.2* 35.8* 32.5*   < > 22.6* 20.9*  --  22.3* 21.9* 22.0*  MCV 95.5 94.2 92.9   < > 94.2  91.7  --  88.8 89.0 90.9  PLT 384 352 333   < > 246 218 201 128* 128* 132*   < > = values in this interval not displayed.   Cardiac Enzymes: No results for input(s): "CKTOTAL", "CKMB", "CKMBINDEX", "TROPONINI" in the last 168 hours. BNP: Invalid input(s): "POCBNP" CBG: Recent Labs  Lab 07/06/22 0815 07/06/22 1147 07/06/22 1152 07/06/22 1548 07/06/22 1938  GLUCAP 120* <10* 140* 139* 141*   D-Dimer Recent Labs    07/04/22 1830  DDIMER 10.11*   Hgb A1c No results for input(s): "HGBA1C" in the last 72 hours. Lipid Profile Recent Labs    06/26/2022 0835 07/06/22 0002  TRIG 961* 977*   Thyroid function studies No results for input(s): "TSH", "T4TOTAL", "T3FREE", "THYROIDAB" in the last 72 hours.  Invalid input(s): "FREET3"  Anemia work up No results for input(s): "VITAMINB12", "FOLATE", "FERRITIN", "TIBC", "IRON", "RETICCTPCT" in the last 72 hours.  Microbiology Recent Results (from the past 240 hour(s))  Body fluid culture w Gram Stain     Status: None   Collection Time: 06/30/22  3:25 PM   Specimen: PATH Cytology Peritoneal fluid  Result Value Ref Range Status   Specimen Description   Final     PERITONEAL Performed at [place] [address] [place] [address] [place], [place] [address]    Special Requests   Final    NONE Performed at [facility], [place] [address] [place], [place] [address]    Gram Stain   Final    RARE WBC PRESENT, PREDOMINANTLY MONONUCLEAR NO ORGANISMS SEEN    Culture   Final    NO GROWTH 3 DAYS Performed at [facility], [place] [address], [place], [place] [address]    Report Status 07/04/2022 FINAL  Final  Anaerobic culture w Gram Stain     Status: None   Collection Time: 06/30/22  3:25 PM   Specimen: Peritoneal Washings  Result Value Ref Range Status   Specimen Description PERITONEAL  Final   Special Requests NONE  Final   Gram Stain   Final    RARE WBC PRESENT, PREDOMINANTLY MONONUCLEAR NO ORGANISMS SEEN    Culture   Final    NO ANAEROBES ISOLATED Performed at [facility], [place] [address], [place], [place] [address]    Report Status 06/30/2022 FINAL  Final  Culture, blood (Routine X 2) w Reflex to ID Panel     Status: None (Preliminary result)   Collection Time: 07/02/22  7:43 PM   Specimen: BLOOD RIGHT HAND  Result Value Ref Range Status   Specimen Description   Final    BLOOD RIGHT HAND Performed at [facility], [place] [address], [place], [place] [address]    Special Requests   Final    BOTTLES DRAWN AEROBIC ONLY Blood Culture results may not be optimal due to an inadequate volume of blood received in culture bottles Performed at [facility] [address] [place], [place] [address]    Culture   Final    NO GROWTH 4 DAYS Performed at [facility], [place] [address] [place], [place] [address]    Report Status PENDING  Incomplete  Culture, blood (Routine X 2) w Reflex to ID Panel     Status: None (Preliminary result)   Collection Time: 07/02/22  7:43 PM   Specimen: BLOOD RIGHT FOREARM  Result Value Ref Range Status   Specimen Description   Final  BLOOD RIGHT  FOREARM Performed at [facility], [place] [address] [place], [place] [address]    Special Requests   Final    IN PEDIATRIC BOTTLE Blood Culture adequate volume Performed at [facility] [address], [place], [place] [address]    Culture   Final    NO GROWTH 4 DAYS Performed at [facility], [place] [address], [place], [place] [address]    Report Status PENDING  Incomplete  Peritoneal fluid culture w Gram Stain     Status: None (Preliminary result)   Collection Time: 07/03/22 12:43 PM   Specimen: Peritoneal Fluid  Result Value Ref Range Status   Specimen Description   Final    FLUID PERITONEAL Performed at [facility], [place] [address] [place], [place] [address]    Special Requests   Final    NONE Performed at [facility], [place] [address] [place], [place] [address]    Gram Stain NO WBC SEEN NO ORGANISMS SEEN   Final   Culture   Final    NO GROWTH 3 DAYS Performed at [facility], [place] [address] [place], [place] [address]    Report Status PENDING  Incomplete  Culture, Respiratory w Gram Stain     Status: None (Preliminary result)   Collection Time: 07/04/22  2:18 PM   Specimen: Tracheal Aspirate; Respiratory  Result Value Ref Range Status   Specimen Description   Final    TRACHEAL ASPIRATE Performed at [facility] [address] [place], [place] [address]    Special Requests   Final    NONE Performed at [facility], [place] [address], [place], [place] [address]    Gram Stain   Final    FEW SQUAMOUS EPITHELIAL CELLS PRESENT FEW WBC PRESENT,BOTH PMN AND MONONUCLEAR FEW GRAM NEGATIVE RODS FEW GRAM POSITIVE COCCI IN PAIRS    Culture   Final    ABUNDANT ESCHERICHIA COLI REPEATING SUSCEPTIBILITY Performed at [facility], [place] [address], [place],  [address]    Report Status PENDING  Incomplete      Studies:  VAS [place] UPPER EXTREMITY ARTERIAL  DUPLEX  Result Date: 07/06/2022  UPPER EXTREMITY DUPLEX STUDY Patient Name:  Drexler L Rice  Date of Exam:   07/06/2022 Medical Rec #: [id number]         Accession #:    [id number] Date of Birth: August 28, 1950         Patient Gender: M Patient Age:   71 years Exam Location:  [facility] Procedure:      VAS [place] UPPER EXTREMITY ARTERIAL DUPLEX Referring Phys: Noe Gens --------------------------------------------------------------------------------  Indications: Cold/dusky hand. History:     Patient has a history of Right radial Aline.  Risk Factors: Hypertension. Comparison Study: No prior studies. Performing Technologist: Oliver Hum RVT  Examination Guidelines: A complete evaluation includes B-mode imaging, spectral Doppler, color Doppler, and power Doppler as needed of all accessible portions of each vessel. Bilateral testing is considered an integral part of a complete examination. Limited examinations for reoccurring indications may be performed as noted.  Right Doppler Findings: +---------------+----------+---------+--------+--------+ Site           PSV (cm/s)Waveform StenosisComments +---------------+----------+---------+--------+--------+ Subclavian Mid 74        triphasic                 +---------------+----------+---------+--------+--------+ Subclavian Dist  triphasic                 +---------------+----------+---------+--------+--------+ Axillary       84        triphasic                 +---------------+----------+---------+--------+--------+ Brachial Prox  98        triphasic                 +---------------+----------+---------+--------+--------+ Brachial Dist  73        triphasic                 +---------------+----------+---------+--------+--------+ Radial Prox    53        triphasic                 +---------------+----------+---------+--------+--------+ Radial Mid     56        triphasic                  +---------------+----------+---------+--------+--------+ Radial Dist    49        triphasic                 +---------------+----------+---------+--------+--------+ Ulnar Prox     92        triphasic                 +---------------+----------+---------+--------+--------+ Ulnar Mid      63        triphasic                 +---------------+----------+---------+--------+--------+ Ulnar Dist     41        triphasic                 +---------------+----------+---------+--------+--------+ Incidental finding of DVT within one of the paired brachial veins, and the radial veins. SVT noted within the cephalic vein of the mid forearm.   Summary:  Right: No obstruction visualized in the right upper extremity        Incidental finding of DVT within one of the paired brachial        veins, and the radial veins.        SVT noted within the cephalic vein of the mid forearm. *See table(s) above for measurements and observations. Electronically signed by Jamelle Haring on 07/06/2022 at 5:30:01 PM.    Final    DG Abd 1 View  Result Date: 07/06/2022 CLINICAL DATA:  NGT placement EXAM: ABDOMEN - 1 VIEW COMPARISON:  06/15/2022. FINDINGS: Limited examination of the upper abdomen. NG tube tip superimposed with the stomach below diaphragm. IMPRESSION: NGT in place. Electronically Signed   By: Sammie Bench M.D.   On: 07/06/2022 11:39   DG CHEST PORT 1 VIEW  Result Date: 07/06/2022 CLINICAL DATA:  Acute respiratory failure with hypoxia. Endotracheally intubated. EXAM: PORTABLE CHEST 1 VIEW COMPARISON:  07/04/2022 FINDINGS: Patient is rotated to the left on the current exam. Support lines and tubes in appropriate position. Low lung volumes are again demonstrated. Infiltrate or atelectasis at both lung bases shows no significant change. IMPRESSION: No significant change in bibasilar atelectasis versus infiltrates. Electronically Signed   By: Marlaine Hind M.D.   On: 07/06/2022 08:50   DG CHEST PORT 1  VIEW  Result Date: 07/06/2022 CLINICAL DATA:  Endotracheal tube adjustment EXAM: PORTABLE CHEST 1 VIEW COMPARISON:  07/06/2022 at 0456 hours FINDINGS: Endotracheal tube terminates 3.6 cm above the carina. Enteric tube is positioned within the stomach. Left PICC line and  left IJ approach central line both terminate near the superior cavoatrial junction. Stable heart size. Low lung volumes with bibasilar opacities. Probable trace right pleural effusion. No pneumothorax. IMPRESSION: 1. Endotracheal tube terminates 3.6 cm above the carina. 2. Low lung volumes with bibasilar opacities. Probable trace right pleural effusion. Electronically Signed   By: Davina Poke D.O.   On: 07/06/2022 08:27   DG Abd 1 View  Result Date: 07/06/2022 CLINICAL DATA:  Enteric catheter placement EXAM: ABDOMEN - 1 VIEW COMPARISON:  07/03/2022 FINDINGS: Frontal view of the lower chest and upper abdomen demonstrates enteric catheter tip and side port projecting over the gastric fundus. Paucity of small bowel gas. Residual contrast within the colon. Patchy bibasilar lung consolidation is noted. IMPRESSION: 1. Enteric catheter tip projecting over the gastric fundus. Electronically Signed   By: Randa Ngo M.D.   On: 06/16/2022 16:24    Assessment: 71 y.o. 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  -At this point, the primary site for peritoneal carcinomatosis is still unknown, possible primary peritoneal carcinoma. -I spoke with general surgeon Dr. Crisoforo Oxford at [facility], he is  aware of this pt (ICU Dr. Tamala Julian spoke with him), and is willing to take his transfer when he is off vent and clinically stable.  -I have requested FO and MMR on his omental biopsy, I will also request tissue origin test to help figuring out the primary site of his malignancy  -I will f/u peripherally, and see him back if needed before his transfer to [facility].  -I spoke with his wife, daughter and niece at bedside today.    Truitt Merle, MD 07/06/2022

## 2022-07-06 NOTE — Progress Notes (Signed)
ABG completed , called and sent to lab 

## 2022-07-06 NOTE — Progress Notes (Signed)
NAME:  Cameron Rice, MRN:  [id number], DOB:  03-18-1951, LOS: 32 ADMISSION DATE:  06/29/2022, CONSULTATION DATE:  07/03/2022 REFERRING MD:  Gershon Cull NP CHIEF COMPLAINT:  Acute resp failure with shock  History of Present Illness:  71 y/o male with PMHx significant for HTN, hyperlipidemia, recurrent UTI and umbilical hernia repair (Nov 2) who presented to [facility] 11/15 with increasing abdominal distention and malaise. Patient reported 2 days of n/v as well as abdominal pain/distention since his repair. He also had not had a bowel movement since the operation. He originally was seen at [facility] where CT A/P revealed possible metastatic disease as well as SBO. He was admitted with general surgery consultation for SBO and suspected intraperitoneal carcinomatosis. He denied any blood in his vomit or stool. No fever/chills.   During this hospitalization he has been since gone back to the OR for diagnostic lap which did reveal peritoneal carcinomatosis (Oncology consulted). He has had an intraoperative paracentesis as well as a subsequent one (11/24). On 11/27, patient again appeared to have large volume ascites. Over the course of his hospitalization he has been treated for SBO with NG decompression. He was also found to have a PE without associated DVT and was started on heparin gtt 11/18. Per surgical notes, he was having some improvement with passing of gas and small BM. His NG was discontinued morning of 11/26 after having tolerated clamping trial with clears. Unfortunately 11/26 PM he required transfer to ICU after being noted to be tachycardic and hypotensive. Repeat CTA Chest 11/26 revealed no additional PE but aspiration as well as large volume ascites and a fluid filled esophagus. Upon exam by TRH, abdomen was quite distended and tympanic. NG was attempted to be placed on floor with subsequent vomiting and obvious aspiration.   Upon arrival to ICU patient remained tachycardic (140s) was given small  volume fluid resuscitation but remained hypotensive with SBP 70s, tachypneic and requiring HHFNC 100% and 40L. He states he is feeling some relief with the NG placement that just occurred and already as ~281m output of dark contents. CCM was consulted for hypotension and concern for need for airway. He is able to talk at this time is oriented and alert. He does have some conversational dyspnea. He states he would want intubation if absolutely necessary. He is agreeable to pressors and would like to remain a full code.   Pertinent Medical History:  HTN Hyperlipidemia Umbilical hernia s/p repair 06/08/22 Peritoneal carcinomatosis unknown primary  Significant Hospital Events: Including procedures, antibiotic start and stop dates in addition to other pertinent events   [facility] Ex lap with frozen sections + for adenocarcinoma, awaiting final path 11/18 Dx with PE started on heparin 11/19 Negative LE dopplers 11/26 Aspiration event  11/27 Transferred to ICU, NG replaced, ongoing tachycardia/hypotension. Delirium in PM prompting CT Head ([facility], CT A/P with SBO, multiple areas of bowel thickening/peritoneal implants, increasing BLL consolidation, anasarca.  11/28 Downtrending WBC/H&H, suspect component of dilution. Hgb 7.3, 1U PRBCs given. CCS/Oncology reengaged. [facility] transfer center contacted. CT Head negative. CT abd/pelvis with SBO, multiple areas of bowel thickening/peritoneal implants, increasing BLL lung consolidation, anasarca. Confusion / delirium late evening.    Interim History / Subjective:  Tmax 99.7 Glucose range 149-160 I/O 1.9L UOP, +1.2L  Off pressors overnight Remains on fentanyl infusion   Objective:  Blood pressure 132/83, pulse (!) 115, temperature 99.7 F (37.6 C), temperature source Axillary, resp. rate (!) 21, height '5\' 11"'$  (1.803  m), weight 81.2 kg, SpO2 97 %.    Vent Mode: PRVC FiO2 (%):  [30 %-40 %] 30 % Set Rate:  [18 bmp-20 bmp] 18  bmp Vt Set:  [600 mL] 600 mL PEEP:  [8 cmH20] 8 cmH20 Plateau Pressure:  [23 cmH20-26 cmH20] 26 cmH20   Intake/Output Summary (Last 24 hours) at 07/06/2022 0738 Last data filed at 07/06/2022 3086 Gross per 24 hour  Intake 3214.68 ml  Output 1985 ml  Net 1229.68 ml   Filed Weights   07/04/22 0500 07/03/2022 0351 07/06/22 0515  Weight: 76.4 kg 79.9 kg 81.2 kg   Physical Exam: General: critically ill appearing adult male lying in bed in NAD on vent    HEENT: MM pink/moist, ETT, anicteric, pupils =/reactive  Neuro: Awake, alert, generalized weakness, follows commands, nods yes / no to questions CV: s1s2 RRR, no m/r/g, SR on monitor  PULM: non-labored at rest, low lung volumes on PSV GI: protuberant,tight, bsx4 hypoactive  Extremities: warm/dry, trace to 1+ edema. Right hand with improved warmth, color.   Skin: no rashes or lesions.  Gtts: fentanyl 150 mcg  Resolved Hospital Problem List:     Assessment & Plan:   Septic Shock secondary to Aspiration Lactic Acidosis Relative Adrenal Insufficiency  Off pressors 48h after stress dose steroids initiated.  -monitor off pressors  -repeat lasix x1  -continue zosyn, micafungin  -follow up tracheal aspirate > abundant GNR, final results pending  -continue stress dose steroids   Acute Hypoxic Respiratory Failure Acute PE  Aspiration Pneumonia Echo 11/27 with LVEF 60-65%, mildly D-shaped septum c/f RV pressure/volume overload. -PRVC with LTVV  -wean PEEP / FiO2 for sats >90% -rate 18 -repeat CXR for ETT placement now  -abx as above  -resume heparin without bolus -change sedation to Precedex with PRN fentanyl in effort for vent weaning  Peritoneal Carcinomatosis Ascites, malignant in nature Bacterial peritonitis SBO Path/cyto with moderately differentiated adenocarcinoma. Surgery/Oncology following. CT A/P 11/27 now demonstrating SBO, multiple areas of bowel thickening/peritoneal implants, increasing BLL consolidation,  anasarca. EGD on 11/29 with areas of ulceration.  -NPO  -continue TPN  -NGT -PRN paracentesis, last assessed with [place] on 11/29, not enough fluid for para. Possible SBP on prior para - on appropriate abx for empiric coverage  -appreciate CCS, ONC -[facility] contacted again 11/30, requesting face sheet from [facility]   -Prior contact > [facility] contacted 11/28 > Dr. Crisoforo Oxford from surgical oncology [phone].  If can stabilize from resp/cardiac/renal they will consider diverting ostomy if persistent obs symptoms  Hypertriglyceridemia  On TPN, propofol  -sedation changed 11/29 -follow intermittent triglycerides -prior hx or oral agents at home  -pharmacy to adjust TPN   Anemia, multifactorial Hgb drift 11/27-11/28, CT Head/CT A/P without e/o active bleeding. NGT output darker. -trend H/H  -transfuse for Hgb <7% or active bleeding, none currently  -BID PPI  -follow NGT output   Acute Renal Failure secondary to ATN, prerenal AKI Cr uptrending (2.22), baseline 0.8. Multifactorial in the setting of shock/hypotension and poor PO intake/GI losses. Improved with support.  -Trend BMP / urinary output -Replace electrolytes as indicated -Avoid nephrotoxic agents, ensure adequate renal perfusion  GOC Ongoing GOC discussion necessary in the setting of poor prognosis with widespread peritoneal carcinomatosis (moderately differentiated adeno with unknown primary). -would need further stabilization before [facility] would consider intervention  -appreciate ONC evaluation  -continue full scope care per patient wishes  -family would want transfer to [facility] if accepted   Best Practice: (right click and "Reselect all SmartList  Selections" daily)  Diet/type: TPN DVT prophylaxis: SCD, held systemic heparin 11/28 AM given Hgb drift GI prophylaxis: PPI Lines: Central line Foley:  Yes, and it is still needed Code Status:  full code Last date of multidisciplinary goals of care discussion: 11/27AM with patient.  Family  updated at bedside 11/30  Critical care time: 21 minutes    Noe Gens, MSN, APRN, NP-C, AGACNP-BC [facility] 07/06/2022, 7:38 AM   Please see [email or web address] for pager details.   From 7A-7P if no response, please call [phone] After hours, please call ELink [phone]

## 2022-07-06 NOTE — Progress Notes (Signed)
   07/06/22 0300 07/06/22 0314 07/06/22 0315  Vitals  BP 131/74 134/83 134/83  MAP (mmHg) 93 98 98  Pulse Rate (!) 102 (!) 104 (!) 105    07/06/22 0330  Vitals  BP 131/75  MAP (mmHg) 93  Pulse Rate (!) 102   Call placed to [facility] - pt titrated off Levo, remains on Vaso;  Call returned by Dr. Emmit Alexanders - ok to titrate down by 0.01 units/minute every 60 minutes as long as pt tolerates/vitals support.

## 2022-07-06 NOTE — Progress Notes (Signed)
ANTICOAGULATION CONSULT NOTE - Follow Up Consult  Pharmacy Consult for Heparin Indication: pulmonary embolus  No Known Allergies  Patient Measurements: Height: '5\' 11"'$  (180.3 cm) Weight: 81.2 kg (179 lb 0.2 oz) IBW/kg (Calculated) : 75.3 Heparin Dosing Weight: 81.2 kg  Vital Signs: Temp: 98.1 F (36.7 C) (11/30 1711) Temp Source: Axillary (11/30 1711) BP: 122/81 (11/30 1700) Pulse Rate: 110 (11/30 1700)  Labs: Recent Labs    07/04/22 0507 07/04/22 1600 07/04/22 1830 06/18/2022 0523 07/02/2022 1606 07/06/22 0002 07/06/22 1705  HGB 7.3*   < >  --  7.7* 7.5* 7.4*  --   HCT 22.6*   < >  --  22.3* 21.9* 22.0*  --   PLT 246   < > 201 128* 128* 132*  --   APTT  --   --  35  --   --   --   --   LABPROT  --   --  16.7*  --   --   --   --   INR  --   --  1.4*  --   --   --   --   HEPARINUNFRC 0.33  --   --   --   --   --  0.36  CREATININE 1.01  --   --  1.07  --  0.78  --    < > = values in this interval not displayed.    Estimated Creatinine Clearance: 92.8 mL/min (by C-G formula based on SCr of 0.78 mg/dL).  Assessment:  AC/Heme: heparin drip for new PE on CT (and suspicious for right common iliac DVT ). Dopplers negative for DVT. 11/28 new maroon NGO & Hg 7.3> DC UFH  11/30 Hgb 7.4. Plts 132, resume UFH, Hep level 0.36 in goal.  Goal of Therapy:  Heparin level 0.3-0.7 units/ml Monitor platelets by anticoagulation protocol: Yes   Plan:  Con't IV heparin at 1700 units/hr Daily HL and CBC   Bessie Boyte S. Alford Highland, PharmD, BCPS Clinical Staff Pharmacist [email or web address] Alford Highland, [place] 07/06/2022,6:32 PM

## 2022-07-06 NOTE — Progress Notes (Signed)
Right upper extremity arterial duplex has been completed. Preliminary results can be found in CV Proc through chart review.  Results were given to Noe Gens NP.  07/06/22 9:50 AM Cameron Rice RVT

## 2022-07-06 NOTE — Progress Notes (Addendum)
[facility] Progress Note Patient Name: Cameron Rice DOB: May 04, 1951 MRN: [id number]   Date of Service  07/06/2022  HPI/Events of Note  Hypotension - BP = 90/66 with MAP = 70 d/t increased sedation requirement for ventilator asynchrony.  eICU Interventions  Plan: Monitor CVP now and Q 4 hours.  Norepinephrine IV infusion. Titrate to MAP >= 65.      Intervention Category Major Interventions: Hypotension - evaluation and management  Lashun Mccants [place] 07/06/2022, 8:00 PM

## 2022-07-06 NOTE — Progress Notes (Signed)
PHARMACY - TOTAL PARENTERAL NUTRITION CONSULT NOTE   Indication: SBO with Prolonged ileus  Patient Measurements: Height: _0  (180.3 cm) Weight: 81.2 kg (179 lb 0.2 oz) IBW/kg (Calculated) : 75.3 TPN AdjBW (KG): 76.2 Body mass index is 24.97 kg/m. Usual Weight:    Assessment:  Post operative abdominal pain with ascites after umbilical hernia repair with mesh on 06/08/22.   On 11/16 he had Lap biopsy and ascites aspiration.  Now has ileus with SBO.  Glucose / Insulin: No h/o DM noted. A1C 4.8.  11/28 hydrocortisone 100 mg IV q12 added CBGs prior to adding stress steroids < 180  CBGs 153- 195, past 24 hrs, all < 180 x 1 - resistant SSI used: 22 units last 24h Solucorted 100 q12 decreased to 100 qday x 2 doses to end 12/2 am dose Electrolytes: Na 140 (none in TPN), K 4.6 , Mag 2.2, Phos 2.3  (goal K>=4 and Mg>=2 with ileus.) CoCa 9.68 Renal: h/o BPH (holding Flomax while on pressors) SCr WNL, BUN 45  Hepatic: AST/ALT/Alk phos: OK.  Tbili 3.2 up, no jaundice reported; Triglycerides 189> 243> 321>> 922 today w/ addition of propofol yesterday- repeat Trig 961>changed propofol to precedex,11/29.  Trig 977 11/30 Albumin 1.9 Intake / Output; MIVF:  Output:  NG 0 mls/24 hrs, UOP 1985 mls/24hr, LBM 11/28 11/29 lasix 20 IV x 1 per CCM 11/30 lasix 20 IV x 1 per CCM GI Imaging: 11/15: CT: Mildly dilated loops of small bowel in the left lower quadrant with scattered air-fluid levels, nonspecific for ileus versus early obstruction 11/17: Xray: ileus vs obstruction 11/18: CT:  peritoneal carcinomatosis,  partial bowel obstruction/ileus 11/27 CT: SBO, extensive peritoneal & omental carcinomatosis w/ free ascites (less than prev)  GI Surgeries / Procedures:  11/2 hernia repair 11/16 diagnostic laparoscopy 11/29 EGD: esophagitis, no bleeding; non-bleeding gastric ulcers  Central access: PICC 11/21 TPN start date: 11/21  Nutritional Goals: Goal TPN rate is 85 mL/hr to provide 108 g of  protein and 2154 kcals per day)  RD Assessment: Estimated Needs Total Energy Estimated Needs: 2100-2300 Total Protein Estimated Needs: 95-110g Total Fluid Estimated Needs: 2.1L/day  Current Nutrition:  NPO and TPN  Plan:  Now: N[place] 25 mMol Continue TPN at goal rate 26m/hr at 1800.( Provides 108 gm protein & ~ 1820 kcals)  Remove lipids from TPN due to elevated Trig and increase dextrose to 20%  Electrolytes in TPN: Na 020m/L, K 55 mEq/L, Ca 15m7mL, Mg 2mE56m, and Phos 10mm83m. Cl:Ac 1:1  Add standard MVI and trace elements to TPN Continue q4h resistant SSI No IVFs, per MD Monitor TPN labs on Mon/Thurs and PRN, BMET, Mg & Phos in AM   MicheEudelia Bunchrm.D Use secure chat for questions 07/06/2022 7:37 AM

## 2022-07-06 NOTE — Progress Notes (Signed)
[facility] Progress Note  SUBJECTIVE:   Interval history: Cameron Rice was seen and evaluated today at bedside.  He is unable to provide review of systems.  Per discussion with ICU team, vasopressor requirements are decreasing.  Hopeful for extubation.  Pending transfer to outside facility.  NG tube with bilious contents, no blood.  No recent bowel movement.  Pathology from EGD 06/13/2022 has returned.  Gastric biopsy showed "Gastral antral and oxyntic mucosa with mild nonspecific reactive gastropathy.  Helicobacter pylori-like organisms are not identified on routine H&E stain.  Negative for intestinal metaplasia, dysplasia or malignancy."  These findings were discussed with patient's spouse at bedside as well as ICU team.  Past Medical History:  Diagnosis Date   G6PD deficiency    Hyperlipidemia    Hypertension    UTI (urinary tract infection)    Past Surgical History:  Procedure Laterality Date   LAPAROSCOPY N/A 06/23/2022   Procedure: LAPAROSCOPY DIAGNOSTIC;  Surgeon: Coralie Keens, MD;  Location: [facility];  Service: General;  Laterality: N/A;  with Biopsy of peritoneal nodules   URETHRA SURGERY     went in clean make sure urethra is clear   Current Facility-Administered Medications  Medication Dose Route Frequency Provider Last Rate Last Admin   0.45 % sodium chloride infusion   Intravenous Continuous Danton Clap H, DO 10 mL/hr at 06/18/2022 1100 Infusion Verify at 06/15/2022 1100   acetaminophen (TYLENOL) 160 MG/5ML solution 650 mg  650 mg Per Tube Q6H PRN Loney Laurence, DO       acetaminophen (TYLENOL) tablet 650 mg  650 mg Oral Q6H PRN Loney Laurence, DO       Chlorhexidine Gluconate Cloth 2 % PADS 6 each  6 each Topical Daily Danton Clap H, DO   6 each at 07/06/22 1405   dexmedetomidine (PRECEDEX) 200 MCG/50ML (4 mcg/mL) infusion  0-1.2 mcg/kg/hr Intravenous Continuous Danton Clap H, DO 13.98 mL/hr at 07/06/22 1405 0.7 mcg/kg/hr at 07/06/22  1405   fentaNYL (SUBLIMAZE) injection 50-200 mcg  50-200 mcg Intravenous Q30 min PRN Noe Gens L, NP   100 mcg at 07/06/22 1436   heparin ADULT infusion 100 units/mL (25000 units/275m)  1,700 Units/hr Intravenous Continuous BEudelia Bunch RPH 17 mL/hr at 07/06/22 0857 1,700 Units/hr at 07/06/22 0857   [START ON 07/07/2022] hydrocortisone sodium succinate (SOLU-CORTEF) 100 MG injection 100 mg  100 mg Intravenous Daily SCandee Furbish MD       HYDROmorphone (DILAUDID) injection 0.5 mg  0.5 mg Intravenous Q3H PRN VDanton ClapH, DO   0.5 mg at 07/04/22 1207   insulin aspart (novoLOG) injection 0-20 Units  0-20 Units Subcutaneous Q4H VDanton ClapH, DO   3 Units at 07/06/22 1215   menthol-cetylpyridinium (CEPACOL) lozenge 3 mg  1 lozenge Oral PRN VDanton ClapH, DO   3 mg at 07/03/22 1606   meropenem (MERREM) 1 g in sodium chloride 0.9 % 100 mL IVPB  1 g Intravenous Q8H VDanton ClapH, DO   Stopped at 07/06/22 0548   micafungin (MYCAMINE) 100 mg in sodium chloride 0.9 % 100 mL IVPB  100 mg Intravenous Q24H VDanton ClapH, DO   Stopped at 06/23/2022 1631   midazolam (VERSED) injection 1-2 mg  1-2 mg Intravenous Q1H PRN VDanton ClapH, DO   2 mg at 06/27/2022 1443   Oral care mouth rinse  15 mL Mouth Rinse Q2H Rembert Browe H, DO   15 mL at 07/06/22 1405   Oral care  mouth rinse  15 mL Mouth Rinse PRN Danton Clap H, DO       pantoprazole (PROTONIX) injection 40 mg  40 mg Intravenous Q12H Danton Clap H, DO   40 mg at 07/06/22 1050   phenol (CHLORASEPTIC) mouth spray 1 spray  1 spray Mouth/Throat PRN Danton Clap H, DO   1 spray at 07/04/22 0310   prochlorperazine (COMPAZINE) injection 5 mg  5 mg Intravenous Q6H PRN Danton Clap H, DO       sodium chloride flush (NS) 0.9 % injection 10-40 mL  10-40 mL Intracatheter Q12H Danton Clap H, DO   10 mL at 07/06/22 1054   sodium chloride flush (NS) 0.9 % injection 10-40 mL  10-40 mL Intracatheter PRN Danton Clap H, DO   10 mL at 07/03/22 1729   TPN ADULT (ION)   Intravenous Continuous TPN Loney Laurence, DO 85 mL/hr at 07/06/22 0800 Infusion Verify at 07/06/22 0800   TPN ADULT (ION)   Intravenous Continuous TPN Eudelia Bunch, RPH       Allergies as of 06/23/2022   (No Known Allergies)   Review of Systems:  Review of Systems  Unable to perform ROS: Intubated    OBJECTIVE:   Temp:  [98.1 F (36.7 C)-100 F (37.8 C)] 98.1 F (36.7 C) (11/30 1226) Pulse Rate:  [76-118] 109 (11/30 1200) Resp:  [18-27] 21 (11/30 1200) BP: (91-159)/(60-89) 99/76 (11/30 1200) SpO2:  [94 %-99 %] 95 % (11/30 1447) Arterial Line BP: (140-172)/(54-69) 172/69 (11/29 1645) FiO2 (%):  [30 %-40 %] 30 % (11/30 1447) Weight:  [81.2 kg] 81.2 kg (11/30 0515) Last BM Date : 07/04/22 (Mucus) Physical Exam Constitutional:      General: He is not in acute distress.    Appearance: He is ill-appearing.  HENT:     Head:     Comments: Temporal wasting Cardiovascular:     Rate and Rhythm: Normal rate and regular rhythm.  Pulmonary:     Effort: No respiratory distress.     Comments: Intubated Abdominal:     General: There is distension.     Comments: Firm. Soft bowel sounds (may be related to NG tube suction). NG tube with bilious contents.  Neurological:     Mental Status: He is alert.     Labs: Recent Labs    06/27/2022 0523 07/02/2022 1606 07/06/22 0002  WBC 29.9* 37.5* 40.1*  HGB 7.7* 7.5* 7.4*  HCT 22.3* 21.9* 22.0*  PLT 128* 128* 132*   BMET Recent Labs    07/04/22 0507 06/17/2022 0523 07/06/22 0002  NA 146* 140 140  K 3.5 4.3 4.6  CL 107 107 107  CO2 '30 26 27  '$ GLUCOSE 142* 222* 160*  BUN 49* 52* 45*  CREATININE 1.01 1.07 0.78  CALCIUM 8.1* 7.6* 8.0*   LFT Recent Labs    07/06/22 0002  PROT 5.2*  ALBUMIN 1.9*  AST 45*  ALT 33  ALKPHOS 118  BILITOT 3.2*   PT/INR Recent Labs    07/04/22 1830  LABPROT 16.7*  INR 1.4*   Diagnostic imaging: DG Abd 1 View  Result Date:  07/06/2022 CLINICAL DATA:  NGT placement EXAM: ABDOMEN - 1 VIEW COMPARISON:  06/10/2022. FINDINGS: Limited examination of the upper abdomen. NG tube tip superimposed with the stomach below diaphragm. IMPRESSION: NGT in place. Electronically Signed   By: Sammie Bench M.D.   On: 07/06/2022 11:39   VAS [place] UPPER EXTREMITY ARTERIAL DUPLEX  Result Date: 07/06/2022  UPPER  EXTREMITY DUPLEX STUDY Patient Name:  Sarthak L Rice  Date of Exam:   07/06/2022 Medical Rec #: [id number]         Accession #:    [id number] Date of Birth: 09/07/1950         Patient Gender: M Patient Age:   71 years Exam Location:  [facility] Procedure:      VAS [place] UPPER EXTREMITY ARTERIAL DUPLEX Referring Phys: Noe Gens --------------------------------------------------------------------------------  Indications: Cold/dusky hand. History:     Patient has a history of Right radial Aline.  Risk Factors: Hypertension. Comparison Study: No prior studies. Performing Technologist: Oliver Hum RVT  Examination Guidelines: A complete evaluation includes B-mode imaging, spectral Doppler, color Doppler, and power Doppler as needed of all accessible portions of each vessel. Bilateral testing is considered an integral part of a complete examination. Limited examinations for reoccurring indications may be performed as noted.  Right Doppler Findings: +---------------+----------+---------+--------+--------+ Site           PSV (cm/s)Waveform StenosisComments +---------------+----------+---------+--------+--------+ Subclavian Mid 74        triphasic                 +---------------+----------+---------+--------+--------+ Subclavian Dist          triphasic                 +---------------+----------+---------+--------+--------+ Axillary       84        triphasic                 +---------------+----------+---------+--------+--------+ Brachial Prox  98        triphasic                  +---------------+----------+---------+--------+--------+ Brachial Dist  73        triphasic                 +---------------+----------+---------+--------+--------+ Radial Prox    53        triphasic                 +---------------+----------+---------+--------+--------+ Radial Mid     56        triphasic                 +---------------+----------+---------+--------+--------+ Radial Dist    49        triphasic                 +---------------+----------+---------+--------+--------+ Ulnar Prox     92        triphasic                 +---------------+----------+---------+--------+--------+ Ulnar Mid      63        triphasic                 +---------------+----------+---------+--------+--------+ Ulnar Dist     41        triphasic                 +---------------+----------+---------+--------+--------+ Incidental finding of DVT within one of the paired brachial veins, and the radial veins. SVT noted within the cephalic vein of the mid forearm.   Summary:  Right: No obstruction visualized in the right upper extremity        Incidental finding of DVT within one of the paired brachial        veins, and the radial veins.        SVT noted within the cephalic vein of the mid forearm. *See  table(s) above for measurements and observations.    Preliminary    DG CHEST PORT 1 VIEW  Result Date: 07/06/2022 CLINICAL DATA:  Acute respiratory failure with hypoxia. Endotracheally intubated. EXAM: PORTABLE CHEST 1 VIEW COMPARISON:  07/04/2022 FINDINGS: Patient is rotated to the left on the current exam. Support lines and tubes in appropriate position. Low lung volumes are again demonstrated. Infiltrate or atelectasis at both lung bases shows no significant change. IMPRESSION: No significant change in bibasilar atelectasis versus infiltrates. Electronically Signed   By: Marlaine Hind M.D.   On: 07/06/2022 08:50   DG CHEST PORT 1 VIEW  Result Date: 07/06/2022 CLINICAL DATA:   Endotracheal tube adjustment EXAM: PORTABLE CHEST 1 VIEW COMPARISON:  07/06/2022 at 0456 hours FINDINGS: Endotracheal tube terminates 3.6 cm above the carina. Enteric tube is positioned within the stomach. Left PICC line and left IJ approach central line both terminate near the superior cavoatrial junction. Stable heart size. Low lung volumes with bibasilar opacities. Probable trace right pleural effusion. No pneumothorax. IMPRESSION: 1. Endotracheal tube terminates 3.6 cm above the carina. 2. Low lung volumes with bibasilar opacities. Probable trace right pleural effusion. Electronically Signed   By: Davina Poke D.O.   On: 07/06/2022 08:27   DG Abd 1 View  Result Date: 06/09/2022 CLINICAL DATA:  Enteric catheter placement EXAM: ABDOMEN - 1 VIEW COMPARISON:  07/03/2022 FINDINGS: Frontal view of the lower chest and upper abdomen demonstrates enteric catheter tip and side port projecting over the gastric fundus. Paucity of small bowel gas. Residual contrast within the colon. Patchy bibasilar lung consolidation is noted. IMPRESSION: 1. Enteric catheter tip projecting over the gastric fundus. Electronically Signed   By: Randa Ngo M.D.   On: 06/14/2022 16:24   DG CHEST PORT 1 VIEW  Result Date: 07/04/2022 CLINICAL DATA:  Central line placement EXAM: PORTABLE CHEST 1 VIEW COMPARISON:  Chest 07/04/2022 FINDINGS: Endotracheal tube in good position. Left jugular central venous catheter tip difficult to visualize but probably near the cavoatrial junction or right atrium. Left arm PICC tip also in place entering the SVC with tip not well visualized. Mild thorax NG tube in the stomach Bibasilar airspace disease unchanged. Small pleural effusions unchanged. IMPRESSION: 1. Central venous catheter tip difficult to visualize but probably near the cavoatrial junction or right atrium. 2. Bibasilar airspace disease and small effusions unchanged. Electronically Signed   By: Franchot Gallo M.D.   On: 07/04/2022 16:54     IMPRESSION: Peritoneal carcinomatosis             -Metastatic adenocarcinoma on biopsies, undetermined primary source -Moderate thickening of sigmoid colon on CT imaging 11/27 -Nonspecific mural thickening of distal esophagus on CT imaging 11/18 -Pancreatic neck hypodensity (5 mm) on CT imaging 11/18             -Personal history sigmoid colon polyp on last colonoscopy 04/2021             -Oncology following, plans were for outpatient referral to [facility] to consider oncologic surgery and [facility] for medical oncology care, complicated by inpatient clinical decline Gastric ulcerations, seen on EGD 06/25/2022, pigmented material Normal gastric mucosa, seen on EGD 07/03/2022, status post biopsy Small bowel obstruction             -Likely secondary to #1             -NG tube in place with bilious contents, no bowel movement per nursing staff Peritonitis             -  S/p paracentesis 11/24 and 11/27             -On meropenem and vancomycin Lactic acidosis Acute hypoxemic respiratory failure, required intubation 11/28, remains intubated Normocytic anemia Multifocal pneumonia, suspected related to aspiration  Pulmonary embolism, deep venous thrombosis             -Heparin resumed, no signs of active GI bleeding  PLAN: -Suspect previously noted dark contents in NG tube from multiple gastric ulcers, appears resolved -Gastric mucosal biopsies obtained on EGD 06/25/2022 are unrevealing for primary source of peritoneal carcinomatosis -Recommend continued IV PPI therapy with pantoprazole 40 mg IV every 12 hours, if patient able to tolerate oral intake on discharge would recommend oral PPI therapy twice daily for 2 months -Pending patient's clinical course, would recommend repeat upper endoscopy in 6-8 weeks to evaluate healing of gastric ulcers -No further endoscopic evaluation planned -Trend H/H, transfuse for Hgb < 7 -Gastroenterology team will respectfully sign off and be available  for any questions that arise   LOS: 15 days   Danton Clap, [facility]

## 2022-07-06 NOTE — Progress Notes (Addendum)
ANTICOAGULATION CONSULT NOTE  Pharmacy Consult for Heparin Indication: pulmonary embolus  No Known Allergies  Patient Measurements: Height: '5\' 11"'$  (180.3 cm) Weight: 81.2 kg (179 lb 0.2 oz) IBW/kg (Calculated) : 75.3 Heparin Dosing Weight:  80.8 kg  Vital Signs: Temp: 99.7 F (37.6 C) (11/30 0315) Temp Source: Axillary (11/30 0315) BP: 132/83 (11/30 0717) Pulse Rate: 115 (11/30 0725)  Labs: Recent Labs    07/03/22 1711 07/03/22 1855 07/04/22 0507 07/04/22 1600 07/04/22 1830 07/06/2022 0523 06/17/2022 1606 07/06/22 0002  HGB  --    < > 7.3*   < >  --  7.7* 7.5* 7.4*  HCT  --    < > 22.6*   < >  --  22.3* 21.9* 22.0*  PLT  --    < > 246   < > 201 128* 128* 132*  APTT  --   --   --   --  35  --   --   --   LABPROT  --   --   --   --  16.7*  --   --   --   INR  --   --   --   --  1.4*  --   --   --   HEPARINUNFRC 0.30  --  0.33  --   --   --   --   --   CREATININE  --    < > 1.01  --   --  1.07  --  0.78   < > = values in this interval not displayed.     Estimated Creatinine Clearance: 92.8 mL/min (by C-G formula based on SCr of 0.78 mg/dL).  Brief Assessment: 52 yoM admitted on 11/15.  Pharmacy was consulted on 11/18 to dose heparin IV for new PE on CT (and suspicious for right common iliac DVT). Dopplers negative for DVT.  11/28 heparin stopped fro [employer] & Hg down to 7.3> 1 Unit PRBC given 11/29 EGD: non-bleeding gastritis & non-bleeding gastric ulcers; 1 Unit PRBC given 11/30 pharmacy consulted to resume heparin with no bolus  Hg 7.4, PLT 132  Goal of Therapy:  Heparin level 0.3-0.7 units/ml Monitor platelets by anticoagulation protocol: Yes   Plan:  No bolus per MD orders start heparin drip at 1700 units/hr and check 8 hr heparin level  Also checking aPTT because TG levels > 360 can interfere with heparin levels Daily heparin level/aPTT and CBC  Eudelia Bunch, Pharm.D Use secure chat for questions 07/06/2022 8:08 AM

## 2022-07-06 NOTE — Progress Notes (Signed)
1 Day Post-Op   Chief Complaint/Subjective: Weaned off pressors yesterday, awake and restless through the night  Objective: Vital signs in last 24 hours: Temp:  [98.7 F (37.1 C)-100 F (37.8 C)] 98.7 F (37.1 C) (11/30 0855) Pulse Rate:  [76-115] 115 (11/30 0725) Resp:  [18-24] 21 (11/30 0725) BP: (108-159)/(60-89) 132/83 (11/30 0717) SpO2:  [95 %-99 %] 97 % (11/30 0725) Arterial Line BP: (126-172)/(54-69) 172/69 (11/29 1645) FiO2 (%):  [30 %-40 %] 30 % (11/30 0725) Weight:  [81.2 kg] 81.2 kg (11/30 0515) Last BM Date : 07/04/22 Intake/Output from previous day: 11/29 0701 - 11/30 0700 In: 3214.7 [I.V.:2779.2; IV Piggyback:435.5] Out: 1985 [Urine:1985]  PE: Gen: intubated, arousable Resp: assisted Card: tachycardic Abd: distended, firm, NG with thick output  Lab Results:  Recent Labs    06/27/2022 1606 07/06/22 0002  WBC 37.5* 40.1*  HGB 7.5* 7.4*  HCT 21.9* 22.0*  PLT 128* 132*   Recent Labs    06/26/2022 0523 07/06/22 0002  NA 140 140  K 4.3 4.6  CL 107 107  CO2 26 27  GLUCOSE 222* 160*  BUN 52* 45*  CREATININE 1.07 0.78  CALCIUM 7.6* 8.0*   Recent Labs    07/04/22 1830  LABPROT 16.7*  INR 1.4*      Component Value Date/Time   NA 140 07/06/2022 0002   K 4.6 07/06/2022 0002   CL 107 07/06/2022 0002   CO2 27 07/06/2022 0002   GLUCOSE 160 (H) 07/06/2022 0002   BUN 45 (H) 07/06/2022 0002   CREATININE 0.78 07/06/2022 0002   CALCIUM 8.0 (L) 07/06/2022 0002   PROT 5.2 (L) 07/06/2022 0002   ALBUMIN 1.9 (L) 07/06/2022 0002   AST 45 (H) 07/06/2022 0002   ALT 33 07/06/2022 0002   ALKPHOS 118 07/06/2022 0002   BILITOT 3.2 (H) 07/06/2022 0002   GFRNONAA >60 07/06/2022 0002    Anti-infectives: Anti-infectives (From admission, onward)    Start     Dose/Rate Route Frequency Ordered Stop   07/04/22 2200  vancomycin (VANCOREADY) IVPB 750 mg/150 mL  Status:  Discontinued        750 mg 150 mL/hr over 60 Minutes Intravenous Every 12 hours 07/04/22 1458  06/17/2022 1136   07/04/22 1600  micafungin (MYCAMINE) 100 mg in sodium chloride 0.9 % 100 mL IVPB        100 mg 105 mL/hr over 1 Hours Intravenous Every 24 hours 07/04/22 1439     07/04/22 1600  vancomycin (VANCOCIN) IVPB 1000 mg/200 mL premix        1,000 mg 200 mL/hr over 60 Minutes Intravenous  Once 07/04/22 1458 07/04/22 1736   07/04/22 1545  meropenem (MERREM) 1 g in sodium chloride 0.9 % 100 mL IVPB        1 g 200 mL/hr over 30 Minutes Intravenous Every 8 hours 07/04/22 1458     07/03/22 0300  piperacillin-tazobactam (ZOSYN) IVPB 3.375 g  Status:  Discontinued        3.375 g 12.5 mL/hr over 240 Minutes Intravenous Every 8 hours 07/03/22 0222 07/04/22 1417   07/01/2022 0200  piperacillin-tazobactam (ZOSYN) IVPB 3.375 g  Status:  Discontinued       See Hyperspace for full Linked Orders Report.   3.375 g 12.5 mL/hr over 240 Minutes Intravenous Every 8 hours 06/23/2022 1849 06/27/22 1232   06/23/2022 1900  piperacillin-tazobactam (ZOSYN) IVPB 3.375 g       See Hyperspace for full Linked Orders Report.   3.375 g 100  mL/hr over 30 Minutes Intravenous  Once 07/06/2022 1849 06/23/2022 1940       Assessment/Plan Post operative abdominal pain with ascites after umbilical hernia repair with mesh by Dr. Rosendo Gros 06/08/22  -POD# 14 s/p LAPAROSCOPY DIAGNOSTIC, BIOPSY OF OMENTAL AND PERITONEAL NODULES, ASPIRATION OF ASCITES FOR CYTOLOGY 11/16 Dr. Ninfa Linden -continue TPN, continue supportive care -possible transfer [facility] for surg onc evaluation  FEN - NG tube, TPN VTE - heparin drip for PE ID - vanc and merrem 11/28>> Disposition - ICU for vent dependence   LOS: 15 days   I reviewed last 24 h vitals and pain scores, last 48 h intake and output, last 24 h labs and trends, and last 24 h imaging results.  This care required high  level of medical decision making.   [facility] 07/06/2022, 9:13 AM Please see Amion for pager number during day  hours 7:00am-4:30pm or 7:00am -11:30am on weekends

## 2022-07-07 ENCOUNTER — Inpatient Hospital Stay (HOSPITAL_COMMUNITY): Payer: BC Managed Care – PPO

## 2022-07-07 ENCOUNTER — Encounter (HOSPITAL_COMMUNITY): Payer: Self-pay | Admitting: Internal Medicine

## 2022-07-07 DIAGNOSIS — K56609 Unspecified intestinal obstruction, unspecified as to partial versus complete obstruction: Secondary | ICD-10-CM | POA: Diagnosis not present

## 2022-07-07 LAB — LACTATE DEHYDROGENASE, PLEURAL OR PERITONEAL FLUID: LD, Fluid: 638 U/L — ABNORMAL HIGH (ref 3–23)

## 2022-07-07 LAB — BODY FLUID CELL COUNT WITH DIFFERENTIAL
Eos, Fluid: 0 %
Lymphs, Fluid: 36 %
Monocyte-Macrophage-Serous Fluid: 9 % — ABNORMAL LOW (ref 50–90)
Neutrophil Count, Fluid: 55 % — ABNORMAL HIGH (ref 0–25)
Total Nucleated Cell Count, Fluid: 941 cu mm (ref 0–1000)

## 2022-07-07 LAB — CULTURE, BLOOD (ROUTINE X 2)
Culture: NO GROWTH
Culture: NO GROWTH
Special Requests: ADEQUATE

## 2022-07-07 LAB — APTT: aPTT: 75 seconds — ABNORMAL HIGH (ref 24–36)

## 2022-07-07 LAB — CBC WITH DIFFERENTIAL/PLATELET
Abs Immature Granulocytes: 10.2 10*3/uL — ABNORMAL HIGH (ref 0.00–0.07)
Basophils Absolute: 0 10*3/uL (ref 0.0–0.1)
Basophils Relative: 0 %
Eosinophils Absolute: 0 10*3/uL (ref 0.0–0.5)
Eosinophils Relative: 0 %
HCT: 24.6 % — ABNORMAL LOW (ref 39.0–52.0)
Hemoglobin: 7.9 g/dL — ABNORMAL LOW (ref 13.0–17.0)
Lymphocytes Relative: 9 %
Lymphs Abs: 6.5 10*3/uL — ABNORMAL HIGH (ref 0.7–4.0)
MCH: 29.2 pg (ref 26.0–34.0)
MCHC: 32.1 g/dL (ref 30.0–36.0)
MCV: 90.8 fL (ref 80.0–100.0)
Metamyelocytes Relative: 3 %
Monocytes Absolute: 1.5 10*3/uL — ABNORMAL HIGH (ref 0.1–1.0)
Monocytes Relative: 2 %
Myelocytes: 8 %
Neutro Abs: 54.4 10*3/uL — ABNORMAL HIGH (ref 1.7–7.7)
Neutrophils Relative %: 75 %
Platelets: 147 10*3/uL — ABNORMAL LOW (ref 150–400)
Promyelocytes Relative: 3 %
RBC: 2.71 MIL/uL — ABNORMAL LOW (ref 4.22–5.81)
RDW: 18.9 % — ABNORMAL HIGH (ref 11.5–15.5)
WBC: 72.5 10*3/uL (ref 4.0–10.5)
nRBC: 9.4 % — ABNORMAL HIGH (ref 0.0–0.2)

## 2022-07-07 LAB — GLUCOSE, CAPILLARY
Glucose-Capillary: 141 mg/dL — ABNORMAL HIGH (ref 70–99)
Glucose-Capillary: 144 mg/dL — ABNORMAL HIGH (ref 70–99)
Glucose-Capillary: 151 mg/dL — ABNORMAL HIGH (ref 70–99)
Glucose-Capillary: 170 mg/dL — ABNORMAL HIGH (ref 70–99)
Glucose-Capillary: 194 mg/dL — ABNORMAL HIGH (ref 70–99)
Glucose-Capillary: 195 mg/dL — ABNORMAL HIGH (ref 70–99)
Glucose-Capillary: <10 mg/dL — CL (ref 70–99)

## 2022-07-07 LAB — BASIC METABOLIC PANEL
Anion gap: 7 (ref 5–15)
BUN: 49 mg/dL — ABNORMAL HIGH (ref 8–23)
CO2: 24 mmol/L (ref 22–32)
Calcium: 8 mg/dL — ABNORMAL LOW (ref 8.9–10.3)
Chloride: 109 mmol/L (ref 98–111)
Creatinine, Ser: 0.98 mg/dL (ref 0.61–1.24)
GFR, Estimated: >60 mL/min (ref >60)
Glucose, Bld: 148 mg/dL — ABNORMAL HIGH (ref 70–99)
Potassium: 4.6 mmol/L (ref 3.5–5.1)
Sodium: 140 mmol/L (ref 135–145)

## 2022-07-07 LAB — GLUCOSE, PLEURAL OR PERITONEAL FLUID: Glucose, Fluid: 38 mg/dL

## 2022-07-07 LAB — MRSA NEXT GEN BY PCR, NASAL: MRSA by PCR Next Gen: NOT DETECTED

## 2022-07-07 LAB — LACTIC ACID, PLASMA: Lactic Acid, Venous: 1.9 mmol/L (ref 0.5–1.9)

## 2022-07-07 LAB — CBC
HCT: 22.5 % — ABNORMAL LOW (ref 39.0–52.0)
Hemoglobin: 7.3 g/dL — ABNORMAL LOW (ref 13.0–17.0)
MCH: 29 pg (ref 26.0–34.0)
MCHC: 32.4 g/dL (ref 30.0–36.0)
MCV: 89.3 fL (ref 80.0–100.0)
Platelets: 135 10*3/uL — ABNORMAL LOW (ref 150–400)
RBC: 2.52 MIL/uL — ABNORMAL LOW (ref 4.22–5.81)
RDW: 18.7 % — ABNORMAL HIGH (ref 11.5–15.5)
WBC: 69.7 10*3/uL (ref 4.0–10.5)
nRBC: 6.9 % — ABNORMAL HIGH (ref 0.0–0.2)

## 2022-07-07 LAB — BODY FLUID CULTURE W GRAM STAIN
Culture: NO GROWTH
Gram Stain: NONE SEEN

## 2022-07-07 LAB — CULTURE, RESPIRATORY W GRAM STAIN

## 2022-07-07 LAB — PROCALCITONIN: Procalcitonin: 3.89 ng/mL

## 2022-07-07 LAB — HEPARIN LEVEL (UNFRACTIONATED): Heparin Unfractionated: 0.47 IU/mL (ref 0.30–0.70)

## 2022-07-07 LAB — PATHOLOGIST SMEAR REVIEW

## 2022-07-07 LAB — MAGNESIUM: Magnesium: 2.1 mg/dL (ref 1.7–2.4)

## 2022-07-07 LAB — PHOSPHORUS: Phosphorus: 2.4 mg/dL — ABNORMAL LOW (ref 2.5–4.6)

## 2022-07-07 MED ORDER — FUROSEMIDE 10 MG/ML IJ SOLN
60.0000 mg | Freq: Once | INTRAMUSCULAR | Status: AC
  Administered 2022-07-07: 60 mg via INTRAVENOUS
  Filled 2022-07-07: qty 6

## 2022-07-07 MED ORDER — TRAVASOL 10 % IV SOLN
INTRAVENOUS | Status: AC
  Filled 2022-07-07: qty 1081.2

## 2022-07-07 MED ORDER — FUROSEMIDE 10 MG/ML IJ SOLN
40.0000 mg | Freq: Once | INTRAMUSCULAR | Status: AC
  Administered 2022-07-07: 40 mg via INTRAVENOUS
  Filled 2022-07-07: qty 4

## 2022-07-07 MED ORDER — SODIUM PHOSPHATES 45 MMOLE/15ML IV SOLN
15.0000 mmol | Freq: Once | INTRAVENOUS | Status: AC
  Administered 2022-07-07: 15 mmol via INTRAVENOUS
  Filled 2022-07-07: qty 5

## 2022-07-07 MED ORDER — ALBUMIN HUMAN 25 % IV SOLN
50.0000 g | Freq: Four times a day (QID) | INTRAVENOUS | Status: AC
  Administered 2022-07-07 – 2022-07-08 (×2): 50 g via INTRAVENOUS
  Filled 2022-07-07 (×2): qty 200

## 2022-07-07 MED ORDER — ACETAMINOPHEN 10 MG/ML IV SOLN
1000.0000 mg | Freq: Four times a day (QID) | INTRAVENOUS | Status: AC
  Administered 2022-07-07 – 2022-07-08 (×4): 1000 mg via INTRAVENOUS
  Filled 2022-07-07 (×4): qty 100

## 2022-07-07 MED ORDER — ACETAMINOPHEN 650 MG RE SUPP: 650.0000 mg | Freq: Four times a day (QID) | RECTAL | Status: DC | PRN

## 2022-07-07 NOTE — Progress Notes (Addendum)
NAME:  Cameron Rice, MRN:  [id number], DOB:  21-Apr-1951, LOS: 28 ADMISSION DATE:  06/14/2022, CONSULTATION DATE:  07/03/2022 REFERRING MD:  Gershon Cull NP CHIEF COMPLAINT:  Acute resp failure with shock  History of Present Illness:  71 y/o male with PMHx significant for HTN, hyperlipidemia, recurrent UTI and umbilical hernia repair (Nov 2) who presented to [facility] 11/15 with increasing abdominal distention and malaise. Patient reported 2 days of n/v as well as abdominal pain/distention since his repair. He also had not had a bowel movement since the operation. He originally was seen at [facility] where CT A/P revealed possible metastatic disease as well as SBO. He was admitted with general surgery consultation for SBO and suspected intraperitoneal carcinomatosis. He denied any blood in his vomit or stool. No fever/chills.   During this hospitalization he has been since gone back to the OR for diagnostic lap which did reveal peritoneal carcinomatosis (Oncology consulted). He has had an intraoperative paracentesis as well as a subsequent one (11/24). On 11/27, patient again appeared to have large volume ascites. Over the course of his hospitalization he has been treated for SBO with NG decompression. He was also found to have a PE without associated DVT and was started on heparin gtt 11/18. Per surgical notes, he was having some improvement with passing of gas and small BM. His NG was discontinued morning of 11/26 after having tolerated clamping trial with clears. Unfortunately 11/26 PM he required transfer to ICU after being noted to be tachycardic and hypotensive. Repeat CTA Chest 11/26 revealed no additional PE but aspiration as well as large volume ascites and a fluid filled esophagus. Upon exam by TRH, abdomen was quite distended and tympanic. NG was attempted to be placed on floor with subsequent vomiting and obvious aspiration.   Upon arrival to ICU patient remained tachycardic (140s) was given small  volume fluid resuscitation but remained hypotensive with SBP 70s, tachypneic and requiring HHFNC 100% and 40L. He states he is feeling some relief with the NG placement that just occurred and already as ~247m output of dark contents. CCM was consulted for hypotension and concern for need for airway. He is able to talk at this time is oriented and alert. He does have some conversational dyspnea. He states he would want intubation if absolutely necessary. He is agreeable to pressors and would like to remain a full code.   Pertinent Medical History:  HTN Hyperlipidemia Umbilical hernia s/p repair 06/08/22 Peritoneal carcinomatosis unknown primary  Significant Hospital Events: Including procedures, antibiotic start and stop dates in addition to other pertinent events   [facility] Ex lap with frozen sections + for adenocarcinoma, awaiting final path 11/18 Dx with PE started on heparin 11/19 Negative LE dopplers 11/26 Aspiration event  11/27 Transferred to ICU, NG replaced, ongoing tachycardia/hypotension. Delirium in PM prompting CT Head ([facility], CT A/P with SBO, multiple areas of bowel thickening/peritoneal implants, increasing BLL consolidation, anasarca.  11/28 Downtrending WBC/H&H, suspect component of dilution. Hgb 7.3, 1U PRBCs given. CCS/Oncology reengaged. [facility] transfer center contacted. CT Head negative. CT abd/pelvis with SBO, multiple areas of bowel thickening/peritoneal implants, increasing BLL lung consolidation, anasarca. Confusion / delirium late evening.    Interim History / Subjective:  Febrile with T.max 101.6. WBC up-trending now 69.7.   Objective:  Blood pressure 119/81, pulse (!) 103, temperature (!) 101.8 F (38.8 C), resp. rate (!) 34, height '5\' 11"'$  (1.803 m), weight 81.2 kg, SpO2 97 %. CVP:  [3 mmHg-9  mmHg] 9 mmHg  Vent Mode: PRVC FiO2 (%):  [30 %] 30 % Set Rate:  [18 bmp] 18 bmp Vt Set:  [600 mL] 600 mL PEEP:  [5 cmH20-8 cmH20] 5  cmH20 Plateau Pressure:  [20 cmH20-23 cmH20] 20 cmH20   Intake/Output Summary (Last 24 hours) at 07/07/2022 3086 Last data filed at 07/07/2022 0700 Gross per 24 hour  Intake 3139.13 ml  Output 1775 ml  Net 1364.13 ml   Filed Weights   07/04/22 0500 06/07/2022 0351 07/06/22 0515  Weight: 76.4 kg 79.9 kg 81.2 kg   Physical Exam: General: critically ill appearing adult male on vent  HEENT: pupils pin-point reactive, ETT/NG in place  Neuro: Awakens with physical stimulation CV: Tachy, HR 101, no mRG  PULM: coarse breath sounds, mild accessory muscles  GI: protuberant,tight, absent bowel sounds Extremities: warm/dry, trace to 1+ edema.  Skin: no rashes or lesions.   Resolved Hospital Problem List:     Assessment & Plan:   Septic Shock secondary to Aspiration, with E.Coli PNA  Continue Febrile state and uptrending WBC, question reactive with possible ABD obstruction with ongoing infectious state vs new   Lactic Acidosis Relative Adrenal Insufficiency  -Off pressors 48h after stress dose steroids initiated.  -Given lasix 11/30  Plan -Cardiac Monitoring. MAP goal >65. Remains off vasopressors  -continue zosyn, micafungin. Send MRSA PCR  -PAN Culture: Blood, Resp Quant, ?para if fluid pocket seen  -continue stress dose steroids   Acute Hypoxic Respiratory Failure Acute PE  Aspiration Pneumonia Echo 11/27 with LVEF 60-65%, mildly D-shaped septum c/f RV pressure/volume overload. Plan -PRVC with LTVV  -wean PEEP / FiO2 for sats >90% -abx as above  -Continue heparin without bolus -Titrate Precedex with fentanyl for RASS goal 0/-1  -Volume overload: +15L, tachypnea with PS. Will trial 40 mg lasix today   Peritoneal Carcinomatosis Ascites, malignant in nature Bacterial peritonitis SBO -Path/cyto with moderately differentiated adenocarcinoma. Surgery/Oncology following.  -CT A/P 11/27 now demonstrating SBO, multiple areas of bowel thickening/peritoneal implants, increasing BLL  consolidation, anasarca. EGD on 11/29 with areas of ulceration.  -Gastric mucosal biopsies obtained on EGD 07/06/2022 are unrevealing for primary source of peritoneal carcinomatosis  Plan -NPO  -continue TPN  -NGT to [facility] >> EGS with plans to exchange today  -PRN paracentesis, last assessed with [place] on 11/29, not enough fluid for para. Possible SBP on prior para - on appropriate abx for empiric coverage. Will re-look with ultrasound  -appreciate CCS, ONC  Hypertriglyceridemia  -On TPN, propofol  -sedation changed 11/29 Plan -follow intermittent triglycerides -prior hx or oral agents at home  -pharmacy to adjust TPN   Anemia, multifactorial Hgb drift 11/27-11/28, CT Head/CT A/P without e/o active bleeding. NGT output darker. Plan -trend H/H  -transfuse for Hgb <7% or active bleeding, none currently  -BID PPI  -follow NGT output   Acute Renal Failure secondary to ATN, prerenal AKI Cr uptrending (2.22), baseline 0.8. Multifactorial in the setting of shock/hypotension and poor PO intake/GI losses. Improved with support.  Plan  -Trend BMP / urinary output -Replace electrolytes as indicated -Avoid nephrotoxic agents, ensure adequate renal perfusion  GOC Ongoing GOC discussion necessary in the setting of poor prognosis with widespread peritoneal carcinomatosis (moderately differentiated adeno with unknown primary). -would need further stabilization before [facility] would consider intervention  -appreciate ONC evaluation  -continue full scope care per patient wishes  -family would want transfer to [facility] if accepted   Best Practice: (right click and "Reselect all SmartList Selections" daily)  Diet/type:  TPN DVT prophylaxis: SCD, held systemic heparin 11/28 AM given Hgb drift GI prophylaxis: PPI Lines: Central line Foley:  Yes, and it is still needed Code Status:  full code Last date of multidisciplinary goals of care discussion: 11/27AM with patient.  Family updated at bedside 12/1   Dispo:  -[facility] contacted again 11/30, requesting face sheet from [facility]  -Prior contact > [facility] contacted 11/28 > Dr. Crisoforo Oxford from surgical oncology [phone].  If can stabilize from resp/cardiac/renal they will consider diverting ostomy if persistent obs symptoms  Critical care time: 35 minutes    Hayden Pedro, AGACNP-BC [facility]   Please see [email or web address] for pager details.   From 7A-7P if no response, please call [phone] After hours, please call ELink [phone]

## 2022-07-07 NOTE — Consult Note (Signed)
[name]                                                                                        Patient Identification: Patient Name: Cameron Rice MRN: [id number] [name] Date: 06/11/2022  3:44 PM Today's Date: 07/07/2022 Reason for consult: septic shock  Requesting provider: Freda Jackson  Principal Problem:   SBO (small bowel obstruction) ([place]) Active Problems:   Essential hypertension   Complicated UTI (urinary tract infection)   BPH (benign prostatic hyperplasia)   Pure hypercholesterolemia   LFT elevation   Malnutrition of moderate degree   Peritoneal carcinomatosis ([place])   Metastatic adenocarcinoma ([place])   Postoperative ileus ([place])   Acute hypoxic respiratory failure ([place])   Antibiotics:  Zosyn 11/26-11/28, Meropenem 11/28-c Micafungin 11/28-c Vancmycin 11/28-11/29  Lines/Hardware: PICC left arm, Left IJ CVC   Assessment 71 Y O Male with PMH as below with Newly diagnosed metastatic adenocarcinoma to peritoneum, with unknown primary and associated malignant ascites in the setting of recent umbilical hernia repair 81/1  with   # Septic shock - on meropenem and micafungin, low dose levophed. Intraabdominal source with aspiration pna, possible concurrent infective peritonitis as well.   # Acute respiratory failure in the setting of above, complicated abdominal history and aspiration pna - Vent management per primary   # Fevers/Leukocytosis 11/16 and 11/26 blood cx NGTD 11/24 and 11/27 peritoneal cx NG  11/28 trach aspirate cx ESBL E coli  He is broadly covered with both pulmonary and intraabdominal source of infection with meropenem and micafungin. No rashes, wounds. No Bms.  He does have CVC since 11/28  and PICC + fsince 11/21 and would remove it as potential source of infection as feasible   Recommendations  Continue meropenem and micafungin  Fu repeat blood cx and  respiratory cx  Discussed with PCCM about possible PICC  being the source for persistent fevers/leukocytosis and hypotension on appropriate covergae and removal  PCCM planning for paracentesis, will fu ascitic fluid analysis and cx Needs contact precautions given ESBL E coli infection  Patient is also in the process of transfer to tertiary medical center for higher level of care  Following peripherally over the weekend, please call with questions   Rest of the management as per the primary team. Please call with questions or concerns.  Thank you for the consult  Rosiland Oz, MD Infectious Disease Physician [facility] [address] [place], [place] [address] Phone: [phone]  Fax: [phone]  __________________________________________________________________________________________________________ HPI and Hospital Course: 71 year old male with PMH as below including who presented to the ED on 11/15 with increasing abdominal distention and malaise.  Patient had umbilical hernia repair on November 2 and had nausea vomiting as well as abdominal pain and distention since his repair including no bowel movement since the surgery.  CT abdomen/pelvis 11/15 . Extensive high density ascites with infiltration of the omentum and mesentery, with some potential higher density and nodular elements raising the possibility of peritoneal spread of malignancy. Hemoperitoneum with widespread inflammation is a differential diagnostic consideration. Underwent diagnostic lap 11/16 with findings/path  as below.  He had intraoperative paracentesis path - malignant cells with adenocarcinoma) as well as subsequent on 11/24 and 11/27, all no growth.   Hospital course complicated by PE with associated Rt common iliac DVT and was started on heparin gtt. on 11/18.  He was having some improvement in passing of gas and small bowel movement and hence NG was  discontinued 11/26 after having tolerated clamping trial with clear.  11/26 PM he required transfer to ICU after being noted to be tachycardic and hypotensive, repeat CTA chest 11/26 revealed no additional PE but aspiration as well as large volume ascites and a fluid-filled esophagus.  NG was attempted to be placed on floor with subsequent vomiting and obvious aspiration.   11/27 NG replaced, ongoing tachycardia/hypotension. Noted to have delirium in pm with negative CT head for acute abnormality. CT a/p with SBO, multiple areas of bowel thickening /peritoneal implants, increasing BLL consolidation and anasarca/   Patient initially on zosyn 11/15-11/21 for possible intraabdominal source of infection. Was off abtx then restarted back on 11/28 then switched to meropenem and micafungin on 11/28 due to fevers and up trending leukocytosis. He is on low dose levophed this morning. EGD done yesterday with following findings.   11/16 diagnotic lap, biopsy of omental and peritoneal nodules  Findings: The patient was found to have what appeared to be diffuse intra-abdominal carcinomatosis with studding of the small and large bowel, omentum, and peritoneum as well as at the level of the diaphragm. We obtained biopsies of the omentum and peritoneal nodules as well as aspirated fluid for cytology.  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 The source of malignancy cannot be easily identified at the time of surgery  ROS: unable to get at this time. D/w RN at bedside. No increased secretions from ETT, No Bms, No known wound in the back or rashes.   Past Medical History:  Diagnosis Date   G6PD deficiency    Hyperlipidemia    Hypertension    UTI (urinary tract infection)    Past Surgical History:  Procedure Laterality Date   BIOPSY  06/08/2022   Procedure: BIOPSY;  Surgeon: Loney Laurence, DO;  Location: [facility];  Service: Gastroenterology;;   ESOPHAGOGASTRODUODENOSCOPY N/A 06/20/2022    Procedure: ESOPHAGOGASTRODUODENOSCOPY (EGD);  Surgeon: Loney Laurence, DO;  Location: [facility];  Service: Gastroenterology;  Laterality: N/A;  ICU bedside procedure   LAPAROSCOPY N/A 07/04/2022   Procedure: LAPAROSCOPY DIAGNOSTIC;  Surgeon: Coralie Keens, MD;  Location: [facility] ORS;  Service: General;  Laterality: N/A;  with Biopsy of peritoneal nodules   URETHRA SURGERY     went in clean make sure urethra is clear    Scheduled Meds:  Chlorhexidine Gluconate Cloth  6 each Topical Daily   furosemide  40 mg Intravenous Once   hydrocortisone sod succinate (SOLU-CORTEF) inj  100 mg Intravenous Daily   insulin aspart  0-20 Units Subcutaneous Q4H   mouth rinse  15 mL Mouth Rinse Q2H   pantoprazole (PROTONIX) IV  40 mg Intravenous Q12H   polyethylene glycol  17 g Per Tube Daily   sodium chloride flush  10-40 mL Intracatheter Q12H   Continuous Infusions:  sodium chloride Stopped (07/06/22 2006)   dexmedetomidine (PRECEDEX) IV infusion 1 mcg/kg/hr (07/07/22 0534)   fentaNYL infusion INTRAVENOUS 100 mcg/hr (07/07/22 0300)   heparin 1,700 Units/hr (07/07/22 0300)   meropenem (MERREM) IV Stopped (07/07/22 7494)   micafungin (MYCAMINE) 100 mg in sodium chloride 0.9 % 100 mL IVPB Stopped (07/06/22  1658)   norepinephrine (LEVOPHED) Adult infusion 1 mcg/min (07/07/22 0300)   sodium phosphate 15 mmol in dextrose 5 % 250 mL infusion 15 mmol (07/07/22 0817)   TPN ADULT (ION) 85 mL/hr at 07/07/22 0300   TPN ADULT (ION)     PRN Meds:.acetaminophen, fentaNYL, HYDROmorphone (DILAUDID) injection, midazolam, mouth rinse, phenol, prochlorperazine, sodium chloride flush  Social History   Socioeconomic History   Marital status: Married    Spouse name: Not on file   Number of children: Not on file   Years of education: Not on file   Highest education level: Not on file  Occupational History   Not on file  Tobacco Use   Smoking status: Never   Smokeless tobacco: Never  Vaping Use   Vaping Use:  Never used  Substance and Sexual Activity   Alcohol use: No   Drug use: No   Sexual activity: Yes  Other Topics Concern   Not on file  Social History Narrative   Not on file   Social Determinants of Health   Financial Resource Strain: Not on file  Food Insecurity: No Food Insecurity (06/26/2022)   Hunger Vital Sign    Worried About Running Out of Food in the Last Year: Never true    Ran Out of Food in the Last Year: Never true  Transportation Needs: Unknown (06/26/2022)   PRAPARE - Hydrologist (Medical): Patient refused    Lack of Transportation (Non-Medical): Patient refused  Physical Activity: Not on file  Stress: Not on file  Social Connections: Not on file  Intimate Partner Violence: Not At Risk (06/26/2022)   Humiliation, Afraid, Rape, and Kick questionnaire    Fear of Current or Ex-Partner: No    Emotionally Abused: No    Physically Abused: No    Sexually Abused: No   Family History  Problem Relation Age of Onset   Cancer Mother    Cancer Brother        liver cancer   Cancer Maternal Aunt        lung    Vitals BP 119/81   Pulse (!) 106   Temp (!) 101.8 F (38.8 C)   Resp (!) 36   Ht '5\' 11"'$  (1.803 m)   Wt 81.2 kg   SpO2 97%   BMI 24.97 kg/m    Physical Exam Constitutional:  critically sick male lying in the bed, tries to blink eyes and makes gestures     Comments:   Cardiovascular:     Rate and Rhythm: Normal rate and regular rhythm.     Heart sounds:  Pulmonary:     Effort: Pulmonary effort is normal seems to be normal on vent     Comments:   Abdominal:     Palpations: Abdomen is soft.     Tenderness: distended but soft, BS+  Musculoskeletal:        General: No swelling or tenderness in peripheral joints, No signs of septic peripheral joints, Cyanotic rt fingers ( place of previous art line)  Skin:    Comments: No rashes or wound in the anterior aspect, unable to examine back ( per RN, no known wounds). PICC  left arm and Left IJ CVC with no signs of infection  Neurological:     General: makes some gestures through eyes. Limited exam    Pertinent Microbiology Results for orders placed or performed during the hospital encounter of 06/28/2022  Culture, blood (Routine X 2) w Reflex to  ID Panel     Status: None   Collection Time: 06/19/2022  8:20 AM   Specimen: BLOOD  Result Value Ref Range Status   Specimen Description   Final    BLOOD BLOOD RIGHT HAND Performed at [facility] [address] [place], [place] [address]    Special Requests   Final    BOTTLES DRAWN AEROBIC AND ANAEROBIC Blood Culture results may not be optimal due to an inadequate volume of blood received in culture bottles Performed at [facility] [address] [place], [place] [address]    Culture   Final    NO GROWTH 5 DAYS Performed at [facility], [place] [address], [place], [place] [address]    Report Status 06/27/2022 FINAL  Final  Culture, blood (Routine X 2) w Reflex to ID Panel     Status: None   Collection Time: 06/12/2022  8:31 AM   Specimen: BLOOD  Result Value Ref Range Status   Specimen Description   Final    BLOOD BLOOD RIGHT ARM Performed at [facility] [address], [place], [place] [address]    Special Requests   Final    BOTTLES DRAWN AEROBIC AND ANAEROBIC Blood Culture results may not be optimal due to an inadequate volume of blood received in culture bottles Performed at [facility] [address], [place], [place] [address]    Culture   Final    NO GROWTH 5 DAYS Performed at [facility], [place] [address], [place], [place] [address]    Report Status 06/27/2022 FINAL  Final  Surgical pcr screen     Status: None   Collection Time: 07/03/2022 10:52 AM   Specimen: Nasal Mucosa; Nasal Swab  Result Value Ref Range Status   MRSA, PCR NEGATIVE NEGATIVE Final   Staphylococcus aureus NEGATIVE NEGATIVE Final     Comment: (NOTE) The Xpert SA Assay (FDA approved for NASAL specimens in patients 54 years of age and older), is one component of a comprehensive surveillance program. It is not intended to diagnose infection nor to guide or monitor treatment. Performed at [facility], [place] [address] [place], [place] [address]   Anaerobic culture w Gram Stain     Status: None   Collection Time: 06/30/22  3:25 PM   Specimen: Peritoneal Washings  Result Value Ref Range Status   Specimen Description PERITONEAL  Final   Special Requests NONE  Final   Gram Stain   Final    RARE WBC PRESENT, PREDOMINANTLY MONONUCLEAR NO ORGANISMS SEEN    Culture   Final    NO ANAEROBES ISOLATED Performed at [facility], [place] [address], [place], [place] [address]    Report Status 06/24/2022 FINAL  Final  Culture, blood (Routine X 2) w Reflex to ID Panel     Status: None   Collection Time: 07/02/22  7:43 PM   Specimen: BLOOD RIGHT HAND  Result Value Ref Range Status   Specimen Description   Final    BLOOD RIGHT HAND Performed at [facility], [place] [address], [place], [place] [address]    Special Requests   Final    BOTTLES DRAWN AEROBIC ONLY Blood Culture results may not be optimal due to an inadequate volume of blood received in culture bottles Performed at [facility] [address], [place], [place] [address]    Culture   Final  NO GROWTH 5 DAYS Performed at [facility], [place] [address], [place], [place] [address]    Report Status 07/07/2022 FINAL  Final  Culture, blood (Routine X 2) w Reflex to ID Panel     Status: None   Collection Time: 07/02/22  7:43 PM   Specimen: BLOOD RIGHT FOREARM  Result Value Ref Range Status   Specimen Description   Final    BLOOD RIGHT FOREARM Performed at [facility], [place] [address] [place], [place] [address]    Special Requests   Final    IN PEDIATRIC BOTTLE Blood Culture adequate volume Performed at  [facility] [address], [place], [place] [address]    Culture   Final    NO GROWTH 5 DAYS Performed at [facility], [place] [address] [place], [place] [address]    Report Status 07/07/2022 FINAL  Final  Peritoneal fluid culture w Gram Stain     Status: None (Preliminary result)   Collection Time: 07/03/22 12:43 PM   Specimen: Peritoneal Fluid  Result Value Ref Range Status   Specimen Description   Final    FLUID PERITONEAL Performed at [facility], [place] [address], [place], [place] [address]    Special Requests   Final    NONE Performed at [facility], [place] [address], [place], [place] [address]    Gram Stain NO WBC SEEN NO ORGANISMS SEEN   Final   Culture   Final    NO GROWTH 3 DAYS Performed at [facility], [place] [address] [place], [place] [address]    Report Status PENDING  Incomplete  Culture, Respiratory w Gram Stain     Status: None   Collection Time: 07/04/22  2:18 PM   Specimen: Tracheal Aspirate; Respiratory  Result Value Ref Range Status   Specimen Description   Final    TRACHEAL ASPIRATE Performed at [facility] [address], [place], [place] [address]    Special Requests   Final    NONE Performed at [facility], [place] [address], [place], [place] [address]    Gram Stain   Final    FEW SQUAMOUS EPITHELIAL CELLS PRESENT FEW WBC PRESENT,BOTH PMN AND MONONUCLEAR FEW GRAM NEGATIVE RODS FEW GRAM POSITIVE COCCI IN PAIRS Performed at [facility], [place] [address], [place], [place] [address]    Culture   Final    ABUNDANT ESCHERICHIA COLI Confirmed Extended Spectrum Beta-Lactamase Producer (ESBL).  In bloodstream infections from ESBL organisms, carbapenems are preferred over piperacillin/tazobactam. They are shown to have a lower risk of mortality.    Report Status 07/07/2022 FINAL  Final   Organism ID, Bacteria ESCHERICHIA COLI  Final      Susceptibility    Escherichia coli - MIC*    AMPICILLIN >=32 RESISTANT Resistant     CEFAZOLIN >=64 RESISTANT Resistant     CEFEPIME >=32 RESISTANT Resistant     CEFTAZIDIME >=64 RESISTANT Resistant     CEFTRIAXONE >=64 RESISTANT Resistant     CIPROFLOXACIN >=4 RESISTANT Resistant     GENTAMICIN >=16 RESISTANT Resistant     IMIPENEM <=0.25 SENSITIVE Sensitive     TRIMETH/SULFA >=320 RESISTANT Resistant     AMPICILLIN/SULBACTAM >=32 RESISTANT Resistant     PIP/TAZO >=128 RESISTANT Resistant     * ABUNDANT ESCHERICHIA COLI    Pertinent Path  Pathology 06/17/2022 FINAL MICROSCOPIC DIAGNOSIS:  A.   OMENTUM, EXCISION: -  Metastatic adenocarcinoma, moderately differentiated.  B.   PERITONEAL NODULES, EXCISION: -    Metastatic adenocarcinoma, moderately differentiated.  C.   OMENTUM, EXCISION: -    Metastatic adenocarcinoma, moderately differentiated.  COMMENT:  The adenocarcinoma was interrogated with immunohistochemical (IHC) stains (CK7, CK20, CDX2, TTF-1, PAX8, Napsin A).   The adenocarcinoma is CK7 positive and has weak to moderate PAX8 reactivity.   The tumor is non-reactive with all other immunomarkers.  This immunoprofile is not specific as to a primary site.  Notable, the lung markers (TTF-1 and Napsin A are negative).  Although, generally a renal and Mullerian marker, PAX8 reactivity has been described in gastric carcinoma.  The IHC stains have satisfactory controls.  Dr. Saralyn Pilar has viewed the HE slides (intradepartmental QA review for malignancy).   INTRAOPERATIVE DIAGNOSIS: A.  Omentum: "Adenocarcinoma." Intraoperative diagnosis rendered by Dr. Saralyn Pilar at 3:24 PM on 06/29/2022.  06/25/2022 EGD  - LA Grade C esophagitis with no bleeding. No specimens collected. - Z-line irregular, 38 cm from the incisors. - Bilious gastric fluid. Fluid aspiration performed. No specimens collected. [name] (with contact bleeding), granular and nodular mucosa in the greater  curvature. Biopsied. - Non-bleeding gastric ulcers with a flat pigmented spot ([place] Class IIc). Biopsied. - Normal examined duodenum. No specimens collected.   Pertinent Lab seen by me:    Latest Ref Rng & Units 07/07/2022    3:48 AM 07/06/2022   12:02 AM 06/09/2022    4:06 PM  CBC  WBC 4.0 - 10.5 K/uL 69.7  40.1  37.5   Hemoglobin 13.0 - 17.0 g/dL 7.3  7.4  7.5   Hematocrit 39.0 - 52.0 % 22.5  22.0  21.9   Platelets 150 - 400 K/uL 135  132  128       Latest Ref Rng & Units 07/07/2022    3:48 AM 07/06/2022   12:02 AM 06/25/2022    5:23 AM  CMP  Glucose 70 - 99 mg/dL 148  160  222   BUN 8 - 23 mg/dL 49  45  52   Creatinine 0.61 - 1.24 mg/dL 0.98  0.78  1.07   Sodium 135 - 145 mmol/L 140  140  140   Potassium 3.5 - 5.1 mmol/L 4.6  4.6  4.3   Chloride 98 - 111 mmol/L 109  107  107   CO2 22 - 32 mmol/L '24  27  26   '$ Calcium 8.9 - 10.3 mg/dL 8.0  8.0  7.6   Total Protein 6.5 - 8.1 g/dL  5.2    Total Bilirubin 0.3 - 1.2 mg/dL  3.2    Alkaline Phos 38 - 126 U/L  118    AST 15 - 41 U/L  45    ALT 0 - 44 U/L  33       Pertinent Imagings/Other Imagings Plain films and CT images have been personally visualized and interpreted; radiology reports have been reviewed. Decision making incorporated into the Impression / Recommendations.  DG CHEST PORT 1 VIEW  Result Date: 07/07/2022 CLINICAL DATA:  Acute respiratory failure EXAM: PORTABLE CHEST 1 VIEW COMPARISON:  07/06/2022 FINDINGS: Enteric tube with tip just below the clavicular heads. Left PICC and left IJ line with tips at the upper right atrium. The enteric tube reaches the stomach. Low volume chest with streaky density at the bases. No edema, effusion, or pneumothorax. IMPRESSION: 1. Unremarkable hardware as described. 2. Low volume chest with unchanged atelectasis at the bases. Electronically Signed   By: Roderic Palau  Watts M.D.   On: 07/07/2022 05:41   DG Abd Portable 1V  Result Date: 07/07/2022 CLINICAL DATA:  Abdominal  distension EXAM: PORTABLE ABDOMEN - 1 VIEW COMPARISON:  Yesterday.  Abdominal CT 07/03/2022 FINDINGS: Enteric contrast has partially cleared from the colon. Enteric tube with flexion at the side-port. Hazy appearance of the abdomen correlating with ascites on recent CT. IMPRESSION: 1. Hazy abdomen with ascites based on recent CT. There were also fluid distended small bowel loops by CT which would be in apparent on radiography. 2. Located enteric tube with kinking at the proximal side port. Electronically Signed   By: Jorje Guild M.D.   On: 07/07/2022 05:40   VAS [place] UPPER EXTREMITY ARTERIAL DUPLEX  Result Date: 07/06/2022  UPPER EXTREMITY DUPLEX STUDY Patient Name:  Cameron Rice  Date of Exam:   07/06/2022 Medical Rec #: [id number]         Accession #:    [id number] Date of Birth: 1951/01/07         Patient Gender: M Patient Age:   71 years Exam Location:  [facility] Procedure:      VAS [place] UPPER EXTREMITY ARTERIAL DUPLEX Referring Phys: Noe Gens --------------------------------------------------------------------------------  Indications: Cold/dusky hand. History:     Patient has a history of Right radial Al[place].  Risk Factors: Hypertension. Comparison Study: No prior studies. Performing Technologist: Oliver Hum RVT  Examination Guidelines: A complete evaluation includes B-mode imaging, spectral Doppler, color Doppler, and power Doppler as needed of all accessible portions of each vessel. Bilateral testing is considered an integral part of a complete examination. Limited examinations for reoccurring indications may be performed as noted.  Right Doppler Findings: +---------------+----------+---------+--------+--------+ Site           PSV (cm/s)Waveform StenosisComments +---------------+----------+---------+--------+--------+ Subclavian Mid 74        triphasic                 +---------------+----------+---------+--------+--------+ Subclavian Dist          triphasic                  +---------------+----------+---------+--------+--------+ Axillary       84        triphasic                 +---------------+----------+---------+--------+--------+ Brachial Prox  98        triphasic                 +---------------+----------+---------+--------+--------+ Brachial Dist  73        triphasic                 +---------------+----------+---------+--------+--------+ Radial Prox    53        triphasic                 +---------------+----------+---------+--------+--------+ Radial Mid     56        triphasic                 +---------------+----------+---------+--------+--------+ Radial Dist    49        triphasic                 +---------------+----------+---------+--------+--------+ Ulnar Prox     92        triphasic                 +---------------+----------+---------+--------+--------+ Ulnar Mid      63        triphasic                 +---------------+----------+---------+--------+--------+  Ulnar Dist     41        triphasic                 +---------------+----------+---------+--------+--------+ Incidental finding of DVT within one of the paired brachial veins, and the radial veins. SVT noted within the cephalic vein of the mid forearm.   Summary:  Right: No obstruction visualized in the right upper extremity        Incidental finding of DVT within one of the paired brachial        veins, and the radial veins.        SVT noted within the cephalic vein of the mid forearm. *See table(s) above for measurements and observations. Electronically signed by Jamelle Haring on 07/06/2022 at 5:30:01 PM.    Final    DG Abd 1 View  Result Date: 07/06/2022 CLINICAL DATA:  NGT placement EXAM: ABDOMEN - 1 VIEW COMPARISON:  07/04/2022. FINDINGS: Limited examination of the upper abdomen. NG tube tip superimposed with the stomach below diaphragm. IMPRESSION: NGT in place. Electronically Signed   By: Sammie Bench M.D.   On: 07/06/2022 11:39   DG  CHEST PORT 1 VIEW  Result Date: 07/06/2022 CLINICAL DATA:  Acute respiratory failure with hypoxia. Endotracheally intubated. EXAM: PORTABLE CHEST 1 VIEW COMPARISON:  07/04/2022 FINDINGS: Patient is rotated to the left on the current exam. Support lines and tubes in appropriate position. Low lung volumes are again demonstrated. Infiltrate or atelectasis at both lung bases shows no significant change. IMPRESSION: No significant change in bibasilar atelectasis versus infiltrates. Electronically Signed   By: Marlaine Hind M.D.   On: 07/06/2022 08:50   DG CHEST PORT 1 VIEW  Result Date: 07/06/2022 CLINICAL DATA:  Endotracheal tube adjustment EXAM: PORTABLE CHEST 1 VIEW COMPARISON:  07/06/2022 at 0456 hours FINDINGS: Endotracheal tube terminates 3.6 cm above the carina. Enteric tube is positioned within the stomach. Left PICC line and left IJ approach central line both terminate near the superior cavoatrial junction. Stable heart size. Low lung volumes with bibasilar opacities. Probable trace right pleural effusion. No pneumothorax. IMPRESSION: 1. Endotracheal tube terminates 3.6 cm above the carina. 2. Low lung volumes with bibasilar opacities. Probable trace right pleural effusion. Electronically Signed   By: Davina Poke D.O.   On: 07/06/2022 08:27   DG Abd 1 View  Result Date: 06/27/2022 CLINICAL DATA:  Enteric catheter placement EXAM: ABDOMEN - 1 VIEW COMPARISON:  07/03/2022 FINDINGS: Frontal view of the lower chest and upper abdomen demonstrates enteric catheter tip and side port projecting over the gastric fundus. Paucity of small bowel gas. Residual contrast within the colon. Patchy bibasilar lung consolidation is noted. IMPRESSION: 1. Enteric catheter tip projecting over the gastric fundus. Electronically Signed   By: Randa Ngo M.D.   On: 06/13/2022 16:24   DG CHEST PORT 1 VIEW  Result Date: 07/04/2022 CLINICAL DATA:  Central line placement EXAM: PORTABLE CHEST 1 VIEW COMPARISON:  Chest  07/04/2022 FINDINGS: Endotracheal tube in good position. Left jugular central venous catheter tip difficult to visualize but probably near the cavoatrial junction or right atrium. Left arm PICC tip also in place entering the SVC with tip not well visualized. Mild thorax NG tube in the stomach Bibasilar airspace disease unchanged. Small pleural effusions unchanged. IMPRESSION: 1. Central venous catheter tip difficult to visualize but probably near the cavoatrial junction or right atrium. 2. Bibasilar airspace disease and small effusions unchanged. Electronically Signed   By: Franchot Gallo M.D.   On:  07/04/2022 16:54   DG CHEST PORT 1 VIEW  Result Date: 07/04/2022 CLINICAL DATA:  Intubation. EXAM: PORTABLE CHEST 1 VIEW COMPARISON:  07/03/2022 FINDINGS: The endotracheal tube is 2.6 cm above the carina. The NG tube is coursing down the esophagus and into the stomach. Small bilateral pleural effusions and slightly progressive bibasilar atelectasis. IMPRESSION: 1. Endotracheal tube and NG tube in good position. 2. Small bilateral pleural effusions and slightly progressive bibasilar infiltrates. Electronically Signed   By: Marijo Sanes M.D.   On: 07/04/2022 14:28   CT ABDOMEN PELVIS WO CONTRAST  Result Date: 07/03/2022 CLINICAL DATA:  Peritoneal carcinomatosis with unclear primary source. Follow-up small bowel obstruction. Recent umbilical hernia surgery 06/08/2022. EXAM: CT ABDOMEN AND PELVIS WITHOUT CONTRAST TECHNIQUE: Multidetector CT imaging of the abdomen and pelvis was performed following the standard protocol without IV contrast. RADIATION DOSE REDUCTION: This exam was performed according to the departmental dose-optimization program which includes automated exposure control, adjustment of the mA and/or kV according to patient size and/or use of iterative reconstruction technique. COMPARISON:  CTA chest 07/02/2022, abdomen and pelvis CT with contrast 06/28/2022, and chest, abdomen and pelvis CT with  contrast 06/24/2022. FINDINGS: Lower chest: There are increased small pleural effusions, increasing consolidation change in both lower lobes, and patchy infiltrates also increased in posterior lingula, right upper lobe base and right middle lobe. Findings consistent with multi lobar pneumonia and small parapneumonic effusions. The heart is slightly enlarged. There are coronary artery calcifications. Small pericardial effusion is similar to prior studies. An infusion catheter terminates in the right atrium. Hepatobiliary: The liver, gallbladder and bile ducts show no focal abnormality without contrast. No biliary dilatation. Pancreas: No focal abnormality is seen without contrast. Some of the abdominal ascitic fluid abuts the body of the pancreas. Spleen: Not well seen due to streak artifact from the patient's arms in the field and lack of IV contrast. It is not enlarged. The last study with contrast on November 22 demonstrated several small areas of hypoenhancement in the periphery of the spleen potentially representing peritoneal surface lesions, but the spleen was unremarkable on November 18. I cannot tell if this is present on this study without contrast and with streak artifacts through the spleen. Adrenals/Urinary Tract: There is residual renal cortical enhancement. It has been over 24 hours since the contrast dose from yesterday's CTA chest. Please correlate clinically for contrast nephrotoxicity. A 1.6 cm cyst anteriorly in the left kidney is again noted. There are a few tiny too small to characterize hypodensities in the kidneys with no follow-up imaging recommended. No adrenal mass is seen and no urinary stone obstruction. There is mild generalized bladder thickening versus nondistention with contrast in the bladder as well. Stomach/Bowel: NGT loops around within the stomach to the left with the tip just below the cardia. There is moderate debris and fluid within the stomach with gastric distention with air  and fluid, distention of the duodenum, jejunum and at least the proximal 2/3 of the ileum with small-bowel dilatation up to 4.4 cm. There are multiple thickened small bowel segments interspersed with nonthickened segments and no single transitional segment could be identified. The small bowel in the lower abdominopelvic quadrants is decompressed and again noted moderately thickened, which could be due to serosal implants. Obstructive etiology could be distal small bowel infiltrating disease, enteritis, occult adhesions or occult internal hernia. Areas of likely peritoneal carcinomatosis of the hepatic and splenic flexures is again noted as well as moderate thickening in the distal sigmoid colon which  could reflect changes due to colitis, underdistention or infiltrating disease. Vascular/Lymphatic: There is moderate aortoiliac calcific plaque, calcifications in the visceral branch vessel ostia. There is no AAA. There is no discrete adenopathy but there is evidence of diffuse peritoneal carcinomatosis with extensive omental and peritoneal disease with numerous nodular implants anteriorly. These findings were noted previously but better seen with less ascites following 4.2 L paracentesis November 24. Reproductive: Enlarged prostate 5 cm transverse. Other: Mild-to-moderate abdominopelvic free ascites, less than previously. Body wall anasarca appears similar. There is a left inguinal fat hernia. There is no incarcerated hernia. Musculoskeletal: Bilateral mild-to-moderate hip DJD. Degenerative changes and bridging enthesopathy thoracic spine. Degenerative change lumbar spine. Acquired spinal stenosis L3-4, L4-5 and L5-S1 with ankylosis across the superior right SI joint. IMPRESSION: 1. Small-bowel obstruction with multiple thickened small bowel segments interspersed with nonthickened segments and no single transitional segment identified. Obstructive etiology could be due to infiltrating disease with irregular lower  abdominal small bowel thickening possibly indicating serosal implants, or due to enteritis, occult adhesions or occult internal hernia. 2. Thickened distal sigmoid colon which could be due to colitis, underdistention or infiltrating disease. Additional peritoneal implants along the flexures of the colon. 3. Extensive peritoneal and omental carcinomatosis with mild-to-moderate free ascites, less than previously. 4. Increasing consolidation in both lower lobes and patchy infiltrates in the right upper lobe, right middle lobe and lingula. Increased small pleural effusions. 5. Body wall anasarca. 6. Residual bilateral renal cortical enhancement. Please correlate clinically for contrast nephrotoxicity. 7. Aortic and coronary artery atherosclerosis. 8. Prostatomegaly. 9. Left inguinal fat hernia. No incarcerated hernia. 10. Suboptimal visualization of the spleen due to streak artifact from the patient's arms in the field and lack of IV contrast. The spleen was unremarkable on November 06/24/2022, but on November 22 there were areas of peripheral hypoenhancement which were questioned as possibly due to peritoneal surface lesions. Unable to determine on this study if this is still the case. 11. These results will be called to the ordering physician or representative by the radiologist assistant, and communication documented in the PACS or [facility] dashboard. Aortic Atherosclerosis (ICD10-I70.0). Electronically Signed   By: Telford Nab M.D.   On: 07/03/2022 21:19   CT HEAD WO CONTRAST (5MM)  Result Date: 07/03/2022 CLINICAL DATA:  Stroke suspected EXAM: CT HEAD WITHOUT CONTRAST TECHNIQUE: Contiguous axial images were obtained from the base of the skull through the vertex without intravenous contrast. RADIATION DOSE REDUCTION: This exam was performed according to the departmental dose-optimization program which includes automated exposure control, adjustment of the mA and/or kV according to patient size and/or use of  iterative reconstruction technique. COMPARISON:  None Available. FINDINGS: Brain: No evidence of acute infarction, hemorrhage, hydrocephalus, extra-axial collection or mass lesion/mass effect. Sequela of mild-to-moderate chronic microvascular ischemic change. Chronic mineralization of the basal ganglia on the right. Vascular: No hyperdense vessel or unexpected calcification. Skull: Normal. Negative for fracture or focal lesion. Sinuses/Orbits: No acute finding. Other: None. IMPRESSION: No acute intracranial abnormality. Electronically Signed   By: Marin Roberts M.D.   On: 07/03/2022 20:41   ECHOCARDIOGRAM COMPLETE  Result Date: 07/03/2022    ECHOCARDIOGRAM REPORT   Patient Name:   Cameron Rice Date of Exam: 07/03/2022 Medical Rec #:  [id number]        Height:       71.0 in Accession #:    [id number]       Weight:       168.0 lb Date of Birth:  1950-11-22  BSA:          1.958 m Patient Age:    7 years         BP:           94/47 mmHg Patient Gender: M                HR:           120 bpm. Exam Location:  Inpatient Procedure: 2D Echo, Cardiac Doppler and Color Doppler Indications:    R06.02 SOB. Acute respiratory distress.  History:        Patient has no prior history of Echocardiogram examinations.                 Risk Factors:Hypertension and Dyslipidemia. Cancer.  Sonographer:    Roseanna Rainbow RDCS Referring Phys: [id number] [place]  Sonographer Comments: Technically difficult study due to poor echo windows. Patient in fowler's position. Very difficult angle and windows. IMPRESSIONS  1. Left ventricular ejection fraction, by estimation, is 60 to 65%. The left ventricle has normal function. The left ventricle has no regional wall motion abnormalities. Left ventricular diastolic parameters are indeterminate.  2. Mildly D-shaped right ventricle suggesting a degree of RV pressure/volume overload. Right ventricular systolic function is mildly reduced. The right ventricular size is mildly enlarged.  Tricuspid regurgitation signal is inadequate for assessing PA pressure.  3. The mitral valve is normal in structure. No evidence of mitral valve regurgitation. No evidence of mitral stenosis.  4. The aortic valve is tricuspid. Aortic valve regurgitation is not visualized. No aortic stenosis is present.  5. The inferior vena cava is normal in size with greater than 50% respiratory variability, suggesting right atrial pressure of 3 mmHg.  6. A small pericardial effusion is present. The pericardial effusion is circumferential. FINDINGS  Left Ventricle: Left ventricular ejection fraction, by estimation, is 60 to 65%. The left ventricle has normal function. The left ventricle has no regional wall motion abnormalities. The left ventricular internal cavity size was normal in size. There is  no left ventricular hypertrophy. Left ventricular diastolic parameters are indeterminate. Right Ventricle: Mildly D-shaped right ventricle suggesting a degree of RV pressure/volume overload. The right ventricular size is mildly enlarged. No increase in right ventricular wall thickness. Right ventricular systolic function is mildly reduced. Tricuspid regurgitation signal is inadequate for assessing PA pressure. Left Atrium: Left atrial size was normal in size. Right Atrium: Right atrial size was normal in size. Pericardium: A small pericardial effusion is present. The pericardial effusion is circumferential. Mitral Valve: The mitral valve is normal in structure. No evidence of mitral valve regurgitation. No evidence of mitral valve stenosis. Tricuspid Valve: The tricuspid valve is normal in structure. Tricuspid valve regurgitation is trivial. Aortic Valve: The aortic valve is tricuspid. Aortic valve regurgitation is not visualized. No aortic stenosis is present. Pulmonic Valve: The pulmonic valve was not well visualized. Pulmonic valve regurgitation is not visualized. Aorta: The aortic root is normal in size and structure. Venous: The  inferior vena cava is normal in size with greater than 50% respiratory variability, suggesting right atrial pressure of 3 mmHg. IAS/Shunts: No atrial level shunt detected by color flow Doppler.  LEFT VENTRICLE PLAX 2D LVIDd:         3.60 cm     Diastology LVIDs:         2.35 cm     LV e' medial:    9.46 cm/s LV PW:         1.20 cm  LV E/e' medial:  4.9 LV IVS:        1.10 cm     LV e' lateral:   9.03 cm/s LVOT diam:     2.40 cm     LV E/e' lateral: 5.1 LV SV:         60 LV SV Index:   30 LVOT Area:     4.52 cm  LV Volumes (MOD) LV vol d, MOD A2C: 41.9 ml LV vol d, MOD A4C: 38.0 ml LV vol s, MOD A2C: 15.4 ml LV vol s, MOD A4C: 12.0 ml LV SV MOD A2C:     26.5 ml LV SV MOD A4C:     38.0 ml LV SV MOD BP:      30.4 ml RIGHT VENTRICLE             IVC RV S prime:     18.70 cm/s  IVC diam: 2.00 cm TAPSE (M-mode): 1.8 cm LEFT ATRIUM             Index       RIGHT ATRIUM           Index LA diam:        3.10 cm 1.58 cm/m  RA Area:     10.70 cm LA Vol (A2C):   16.4 ml 8.38 ml/m  RA Volume:   17.90 ml  9.14 ml/m LA Vol (A4C):   15.4 ml 7.87 ml/m LA Biplane Vol: 16.1 ml 8.22 ml/m  AORTIC VALVE LVOT Vmax:   102.00 cm/s LVOT Vmean:  62.700 cm/s LVOT VTI:    0.132 m  AORTA Ao Root diam: 3.30 cm Ao Asc diam:  3.00 cm MITRAL VALVE MV Area (PHT): 4.31 cm    SHUNTS MV Decel Time: 176 msec    Systemic VTI:  0.13 m MV E velocity: 46.00 cm/s  Systemic Diam: 2.40 cm MV A velocity: 46.40 cm/s MV E/A ratio:  0.99 MV A Prime:    9.6 cm/s [employer] Electronically signed by Franki Monte Signature Date/Time: 07/03/2022/3:13:07 PM    Final    DG CHEST PORT 1 VIEW  Result Date: 07/03/2022 CLINICAL DATA:  Aspiration pneumonia EXAM: PORTABLE CHEST 1 VIEW COMPARISON:  07/03/2022 FINDINGS: Left-sided PICC line with the tip projecting over the SVC. Nasogastric tube with the tip projecting over the stomach. Hazy bibasilar airspace disease concerning for pneumonia. No pleural effusion or pneumothorax. Heart and mediastinal contours  are unremarkable. No acute osseous abnormality. IMPRESSION: 1. Hazy bibasilar airspace disease concerning for pneumonia. Electronically Signed   By: Kathreen Devoid M.D.   On: 07/03/2022 08:52   DG CHEST PORT 1 VIEW  Result Date: 07/03/2022 CLINICAL DATA:  Possible aspiration following vomiting, initial encounter EXAM: PORTABLE CHEST 1 VIEW COMPARISON:  07/02/2022 FINDINGS: Cardiac shadow is stable. Left PICC is noted in satisfactory position. Gastric catheter is noted within the stomach. Bibasilar atelectatic changes are noted slightly increased when compared with the prior exam. These changes are similar to that seen on prior CT examination and could be related to aspiration. IMPRESSION: Bibasilar changes which may be related to aspiration. No other focal abnormality is noted. Electronically Signed   By: Inez Catalina M.D.   On: 07/03/2022 02:51   DG Abd Portable 1V  Result Date: 07/03/2022 CLINICAL DATA:  Check gastric catheter placement EXAM: PORTABLE ABDOMEN - 1 VIEW COMPARISON:  None Available. FINDINGS: Gastric catheter is coiled within the stomach. Scattered contrast is noted throughout the colon from prior CT examination. No  free air is noted. Contrast is seen within the bladder from recent contrast administration. IMPRESSION: Gastric catheter coiled within the stomach. Electronically Signed   By: Inez Catalina M.D.   On: 07/03/2022 02:50   CT Angio Chest Pulmonary Embolism (PE) W or WO Contrast  Result Date: 07/02/2022 CLINICAL DATA:  Pulmonary embolism suspected, high probability. Tachycardia, shortness of breath. EXAM: CT ANGIOGRAPHY CHEST WITH CONTRAST TECHNIQUE: Multidetector CT imaging of the chest was performed using the standard protocol during bolus administration of intravenous contrast. Multiplanar CT image reconstructions and MIPs were obtained to evaluate the vascular anatomy. RADIATION DOSE REDUCTION: This exam was performed according to the departmental dose-optimization program  which includes automated exposure control, adjustment of the mA and/or kV according to patient size and/or use of iterative reconstruction technique. CONTRAST:  55m OMNIPAQUE IOHEXOL 350 MG/ML SOLN COMPARISON:  06/24/2022. FINDINGS: Cardiovascular: The heart is normal in size a few scattered coronary artery calcifications are noted. There is a trace pericardial effusion. There is mild atherosclerotic calcification of the aorta without evidence of aneurysm. Small residual thrombus in the left upper lobe. No new thrombus is identified. Evaluation is limited due to respiratory motion artifact. Mediastinum/Nodes: No mediastinal, hilar, or axillary lymphadenopathy. The thyroid gland is within normal limits. Debris is present in the trachea. The esophagus is distended with fluid. Lungs/Pleura: Debris is present in the right mainstem bronchus. There is bronchial wall thickening bilaterally with mucous plugging in the right lower lobe. Patchy airspace disease is noted in the right upper and middle lobes and lower lobes bilaterally. There is consolidation at the right lung base. No effusion or pneumothorax. An azygous lobe is noted. Upper Abdomen: Large ascites in the upper abdomen. Musculoskeletal: Degenerative changes in the thoracic spine. No acute osseous abnormality. Review of the MIP images confirms the above findings. IMPRESSION: 1. Left upper lobe pulmonary embolism, not significantly changed from the prior exam. No new lesion is identified. 2. Bronchial wall thickening bilaterally with mucous plugging in the right lower lobe. Scattered airspace opacities in the lungs bilaterally with consolidation at the right lung base, concerning for pneumonia. 3. Distended fluid-filled esophagus. Aspiration pneumonia should be included in the differential diagnosis. 4. Aortic atherosclerosis and coronary artery calcifications. 5. Large ascites. Electronically Signed   By: LBrett FairyM.D.   On: 07/02/2022 21:10   DG Chest  Port 1 View  Result Date: 07/02/2022 CLINICAL DATA:  Lethargy with tachypnea and tachycardia. EXAM: PORTABLE CHEST 1 VIEW COMPARISON:  June 28, 2022 FINDINGS: There is stable left-sided PICC line positioning. The heart size and mediastinal contours are within normal limits. Low lung volumes are noted. Mild atelectasis is seen within the bilateral lung bases. There is no evidence of a pleural effusion or pneumothorax. Multilevel degenerative changes seen throughout the thoracic spine. IMPRESSION: Low lung volumes with mild bibasilar atelectasis. Electronically Signed   By: TVirgina NorfolkM.D.   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 Request for diagnostic and therapeutic paracentesis. EXAM: ULTRASOUND GUIDED PARACENTESIS MEDICATIONS: 1% lidocaine 10 mL COMPLICATIONS: None immediate. PROCEDURE: Informed written consent was obtained from the patient after a discussion of the risks, benefits and alternatives to treatment. A timeout was performed prior to the initiation of the procedure. Initial ultrasound scanning demonstrates a large amount of ascites within the right lateral abdomen. The right lateral abdomen was prepped and draped in the usual sterile fashion. 1% lidocaine was used for local anesthesia. Following  this, a 19 gauge, 7-cm, Yueh catheter was introduced. An ultrasound image was saved for documentation purposes. The paracentesis was performed. The catheter was removed and a dressing was applied. The patient tolerated the procedure well without immediate post procedural complication. FINDINGS: A total of approximately 4.2 L of amber fluid was removed. Samples were sent to the laboratory as requested by the clinical team. IMPRESSION: Successful ultrasound-guided paracentesis yielding 4.2 liters of peritoneal fluid. Procedure performed by: Gareth Eagle, PA-C Electronically Signed   By: Lucrezia Europe M.D.    On: 06/30/2022 16:27   DG Abd Portable 1V  Result Date: 06/29/2022 CLINICAL DATA:  [id number] Encounter for imaging study to confirm nasogastric (NG) tube placement [id number] EXAM: PORTABLE ABDOMEN - 1 VIEW COMPARISON:  Chest x-ray 06/29/2022, CT abdomen pelvis 06/28/2022 FINDINGS: Interval advancement of an enteric tube coursing below the hemidiaphragm with tip and side port overlying the expected region the gastric lumen. Nonobstructive bowel gas pattern of visualized upper abdomen. The heart and mediastinal contours are unchanged. Aortic calcification. Low lung volumes. Persistent retrocardiac opacity that likely represents linear atelectasis for scarring better evaluated on CT abdomen pelvis 06/28/2022. No pulmonary edema. No pleural effusion. No pneumothorax. No acute osseous abnormality. IMPRESSION: 1. Enteric tube in good position. 2. Low lung volumes. 3. Nonobstructive bowel gas pattern. Electronically Signed   By: Iven Finn M.D.   On: 06/29/2022 18:21   DG Abd Portable 1V  Result Date: 06/29/2022 CLINICAL DATA:  Nasogastric tube evaluation. EXAM: PORTABLE ABDOMEN - 1 VIEW COMPARISON:  None Available. FINDINGS: The bowel gas pattern is normal. Nasogastric tube with distal tip at the GE junction it could be 8-10 cm. Low lung volumes with bibasilar atelectasis. IMPRESSION: Nasogastric tube with distal tip at the GE junction, it could be advanced 8-10 cm. Electronically Signed   By: Keane Police D.O.   On: 06/29/2022 17:39   DG Abd Portable 1V  Result Date: 06/29/2022 CLINICAL DATA:  NG tube placement. EXAM: PORTABLE ABDOMEN - 1 VIEW COMPARISON:  06/26/2022 FINDINGS: Enteric/NG tube is noted with tip overlying the UPPER mediastinum. It is difficult to determine if this lies within the esophagus or trachea on this study. Recommend repositioning with clinical correlation. No other changes identified.  There is no evidence of pneumothorax. IMPRESSION: Enteric/NG tube with tip overlying the UPPER  mediastinum, which may lie within the esophagus but recommend repositioning with clinical correlation. Electronically Signed   By: Margarette Canada M.D.   On: 06/29/2022 17:26   CT ABDOMEN PELVIS W CONTRAST  Result Date: 06/28/2022 CLINICAL DATA:  Peritoneal carcinomatosis with abdominal pain. * Tracking Code: BO * EXAM: CT ABDOMEN AND PELVIS WITH CONTRAST TECHNIQUE: Multidetector CT imaging of the abdomen and pelvis was performed using the standard protocol following bolus administration of intravenous contrast. RADIATION DOSE REDUCTION: This exam was performed according to the departmental dose-optimization program which includes automated exposure control, adjustment of the mA and/or kV according to patient size and/or use of iterative reconstruction technique. CONTRAST:  131m OMNIPAQUE IOHEXOL 300 MG/ML  SOLN COMPARISON:  CT scan 06/24/2022 FINDINGS: Lower chest: Streaky bibasilar atelectasis but no infiltrates or effusion. No pulmonary lesions. Hepatobiliary: No a [place] lesions or intrahepatic biliary dilatation. No obvious peritoneal surface lesions. The gallbladder is unremarkable. No common bile duct dilatation. Pancreas: No mass, inflammation or ductal dilatation. Spleen: Possible peripheral peritoneal surface lesions. Adrenals/Urinary Tract: Adrenal glands and kidneys are unremarkable. No worrisome renal lesions or hydronephrosis. Stable simple 15 mm left renal cyst not requiring further evaluation  or follow-up. Stomach/Bowel: The stomach contains an NG tube. The duodenum, and small bowel are unremarkable. No obstructive findings. Mild bowel wall thickening likely due to the surrounding ascitic fluid. Several areas of peritoneal carcinomatosis involving the colonic wall notably the anti mesenteric border of the hepatic flexure and splenic flexure. No obstructive findings. Vascular/Lymphatic: Stable age advanced atherosclerotic calcifications involving the aorta and branch vessels. The major venous  structures are patent. Reproductive: The prostate gland and seminal vesicles are unremarkable. Other: Stable diffuse peritoneal carcinomatosis with extensive omental and peritoneal disease. Interval progressive abdominal/pelvic ascites considered large volume now. Stable left inguinal hernia containing fat. Progressive body wall edema. Musculoskeletal: No significant bony findings. IMPRESSION: 1. Stable diffuse peritoneal carcinomatosis with extensive omental and peritoneal disease. 2. Interval progressive abdominal/pelvic ascites. 3. Several areas of peritoneal carcinomatosis involving the colonic wall but no obstructive findings. 4. Stable age advanced atherosclerotic calcifications involving the aorta and branch vessels. 5. Progressive body wall edema. 6. Aortic atherosclerosis. Aortic Atherosclerosis (ICD10-I70.0). Electronically Signed   By: Marijo Sanes M.D.   On: 06/28/2022 11:20   DG CHEST PORT 1 VIEW  Result Date: 06/28/2022 CLINICAL DATA:  Tachycardia, leukocytosis EXAM: PORTABLE CHEST 1 VIEW COMPARISON:  Radiograph 06/23/2022 FINDINGS: New RIGHT PICC line with tip in the cavoatrial junction. NG tube in stomach. Normal cardiac silhouette. Low lung volumes. No effusion, infiltrate pneumothorax. IMPRESSION: Low lung volumes.  No acute cardiopulmonary process. Electronically Signed   By: Suzy Bouchard M.D.   On: 06/28/2022 08:10   [place] EKG SITE RITE  Result Date: 06/27/2022 If Site Rite image not attached, placement could not be confirmed due to current cardiac rhythm.  DG Abd Portable 1V  Result Date: 06/26/2022 CLINICAL DATA:  Nasogastric tube placement. EXAM: PORTABLE ABDOMEN - 1 VIEW COMPARISON:  Radiograph earlier today. FINDINGS: The enteric tube has been advanced, the tip is now below the diaphragm in the stomach. The side-port is in the distal esophagus. Advancement of at least 6 cm would place the side-port below the diaphragm. Enteric contrast in the colon from prior CT. IMPRESSION:  Tip of the enteric tube now below the diaphragm in the stomach, side-port in the distal esophagus. Advancement of at least 6 cm would place the side-port below the diaphragm. Electronically Signed   By: Keith Rake M.D.   On: 06/26/2022 21:13   DG Abd 1 View  Result Date: 06/26/2022 CLINICAL DATA:  Confirm NG tube placement. EXAM: ABDOMEN - 1 VIEW COMPARISON:  06/23/2022. FINDINGS: The bowel gas pattern is normal. Retained contrast is present in the colon. A nasogastric tube terminates in the mid esophagus and should be advanced proximally 18 cm. Mild atelectasis is noted at the lung bases. No radio-opaque calculi or other acute radiographic abnormality are seen. IMPRESSION: Nasogastric tube terminates in the mid esophagus and should be advanced approximately 18 cm. Electronically Signed   By: Brett Fairy M.D.   On: 06/26/2022 20:04   VAS [place] LOWER EXTREMITY VENOUS (DVT)  Result Date: 06/25/2022  Lower Venous DVT Study Patient Name:  Cameron Rice  Date of Exam:   06/25/2022 Medical Rec #: [id number]         Accession #:    [id number] Date of Birth: Apr 02, 1951         Patient Gender: M Patient Age:   9 years Exam Location:  [facility] Procedure:      VAS [place] LOWER EXTREMITY VENOUS (DVT) Referring Phys: [place] --------------------------------------------------------------------------------  Indications: Pain.  Comparison Study: No  previous exams Performing Technologist: Jody Hill RVT, RDMS  Examination Guidelines: A complete evaluation includes B-mode imaging, spectral Doppler, color Doppler, and power Doppler as needed of all accessible portions of each vessel. Bilateral testing is considered an integral part of a complete examination. Limited examinations for reoccurring indications may be performed as noted. The reflux portion of the exam is performed with the patient in reverse Trendelenburg.  +---------+---------------+---------+-----------+----------+--------------+ RIGHT     CompressibilityPhasicitySpontaneityPropertiesThrombus Aging +---------+---------------+---------+-----------+----------+--------------+ CFV      Full           Yes      Yes                                 +---------+---------------+---------+-----------+----------+--------------+ SFJ      Full                                                        +---------+---------------+---------+-----------+----------+--------------+ FV Prox  Full           Yes      Yes                                 +---------+---------------+---------+-----------+----------+--------------+ FV Mid   Full           Yes      Yes                                 +---------+---------------+---------+-----------+----------+--------------+ FV DistalFull           Yes      Yes                                 +---------+---------------+---------+-----------+----------+--------------+ PFV      Full                                                        +---------+---------------+---------+-----------+----------+--------------+ POP      Full           Yes      Yes                                 +---------+---------------+---------+-----------+----------+--------------+ PTV      Full                                                        +---------+---------------+---------+-----------+----------+--------------+ PERO     Full                                                        +---------+---------------+---------+-----------+----------+--------------+   +---------+---------------+---------+-----------+----------+--------------+  LEFT     CompressibilityPhasicitySpontaneityPropertiesThrombus Aging +---------+---------------+---------+-----------+----------+--------------+ CFV      Full           Yes      Yes                                 +---------+---------------+---------+-----------+----------+--------------+ SFJ      Full                                                         +---------+---------------+---------+-----------+----------+--------------+ FV Prox  Full           Yes      Yes                                 +---------+---------------+---------+-----------+----------+--------------+ FV Mid   Full           Yes      Yes                                 +---------+---------------+---------+-----------+----------+--------------+ FV DistalFull           Yes      Yes                                 +---------+---------------+---------+-----------+----------+--------------+ PFV      Full                                                        +---------+---------------+---------+-----------+----------+--------------+ POP      Full           Yes      Yes                                 +---------+---------------+---------+-----------+----------+--------------+ PTV      Full                                                        +---------+---------------+---------+-----------+----------+--------------+ PERO     Full                                                        +---------+---------------+---------+-----------+----------+--------------+     Summary: BILATERAL: - No evidence of deep vein thrombosis seen in the lower extremities, bilaterally. -No evidence of popliteal cyst, bilaterally.   *See table(s) above for measurements and observations. Electronically signed by Deitra Mayo MD on 06/25/2022 at 7:09:32 PM.    Final    CT CHEST W CONTRAST  Result Date: 06/24/2022 CLINICAL DATA:  Oncologic staging.  Patient with peritoneal carcinomatosis with unclear primary source. EXAM: CT CHEST, ABDOMEN, AND PELVIS WITH CONTRAST TECHNIQUE: Multidetector CT imaging of the chest, abdomen and pelvis was performed following the standard protocol during bolus administration of intravenous contrast. RADIATION DOSE REDUCTION: This exam was performed according to the departmental dose-optimization program which includes  automated exposure control, adjustment of the mA and/or kV according to patient size and/or use of iterative reconstruction technique. CONTRAST:  110m OMNIPAQUE IOHEXOL 300 MG/ML  SOLN COMPARISON:  CT abdomen and pelvis dated 06/25/2022, CT abdomen dated 02/03/2009 FINDINGS: CT CHEST FINDINGS Cardiovascular: Normal heart size. No significant pericardial fluid/thickening. Coronary artery calcifications. Great vessels are normal in course and caliber. Filling defect within the proximal superior lingular artery (504:171) appears eccentric along the superior aspect of the vessel. Mediastinum/Nodes: thyroid gland without nodules meeting criteria for imaging follow-up by size. Nonspecific mural thickening of the distal esophagus. No pathologically enlarged axillary, supraclavicular, mediastinal, or hilar lymph nodes. Lungs/Pleura: The central airways are patent. There are secretions within the trachea and bilateral proximal main bronchi. Bibasilar subsegmental atelectasis. Accessory azygous fissure. 2 mm left lower lobe nodule (505:86) is unchanged from 20/10, likely benign. No focal consolidation. No pneumothorax. Similar small left and trace right pleural effusions. Musculoskeletal: No acute or abnormal lytic or blastic osseous lesions. CT ABDOMEN PELVIS FINDINGS Hepatobiliary: No focal hepatic lesions. No intra or extrahepatic biliary ductal dilation. Normal gallbladder. Pancreas: 5 mm hypodensity in the pancreatic neck (5:65) no main ductal dilation. Spleen: Normal in size without focal abnormality. Adrenals/Urinary Tract: No adrenal nodules. No suspicious renal mass, calculi, or hydronephrosis. Left interpolar simple/minimally complicated cyst measuring 1.4 cm. Additional subcentimeter hypodensities, too small to characterize. No focal bladder wall thickening. Stomach/Bowel: Enteric tube terminates in the stomach. Nonspecific mural thickening of the gastric antrum/pylorus, which may be related to underdistention.  Mildly dilated proximal small bowel loops with gradual caliber transition to the level of the lower abdomen, where there are small bowel loops with irregular mural thickening, for example series 2, image 100, likely serosal implants. normal appendix. Vascular/Lymphatic: Aortic atherosclerosis. Slightly asymmetrically expanded appearance of the right common iliac vein (2:99) with central rounded hypoattenuation extending to the level of the infrarenal IVC, which persists on the delayed images (7:37). 10 mm peripancreatic lymph node (2:60). Reproductive: Mildly enlarged prostate gland. Other: Decreased small volume ascites. Similar irregular thickening and nodularity of the peritoneum and omentum. Trace pneumoperitoneum, likely postsurgical. Musculoskeletal: No acute or abnormal lytic or blastic osseous findings. Diffuse subcutaneous edema. Trace subcutaneous emphysema, likely postsurgical. Small fat-containing left inguinal hernia. IMPRESSION: 1. Pulmonary embolus within the proximal superior lingular artery appears eccentric, suggesting a degree of chronicity. 2. Slightly asymmetrically expanded appearance of the right common iliac vein with central rounded hypoattenuation extending to the level of the infrarenal IVC, which persists on the delayed images, suspicious for deep venous thrombosis. 3. Similar irregular thickening and nodularity of the peritoneum and omentum in keeping with peritoneal carcinomatosis. 4. Mildly dilated proximal small bowel loops with gradual caliber transition to the level of the lower abdomen, where there are small bowel loops with irregular mural thickening, likely serosal implants resulting in partial bowel obstruction/ileus. 5. Nonspecific mural thickening of the distal esophagus and gastric antrum/pylorus, which may be related to underdistention. Consider endoscopic evaluation. 6. A 10 mm peripancreatic lymph node is suspicious for metastasis. 7. Decreased small volume ascites. 8.  Similar small left and trace right pleural effusions. 9. A 5 mm hypodensity in the pancreatic neck, possibly a small side  branch IPMN. Recommend attention on follow-up. 10. Coronary artery calcifications. Aortic Atherosclerosis (ICD10-I70.0). Critical Value/emergent results were called by telephone at the time of interpretation on 06/24/2022 at 4:05 pm to provider Antonieta Pert, who verbally acknowledged these results. Electronically Signed   By: Darrin Nipper M.D.   On: 06/24/2022 16:07   CT ABDOMEN PELVIS W CONTRAST  Result Date: 06/24/2022 CLINICAL DATA:  Oncologic staging. Patient with peritoneal carcinomatosis with unclear primary source. EXAM: CT CHEST, ABDOMEN, AND PELVIS WITH CONTRAST TECHNIQUE: Multidetector CT imaging of the chest, abdomen and pelvis was performed following the standard protocol during bolus administration of intravenous contrast. RADIATION DOSE REDUCTION: This exam was performed according to the departmental dose-optimization program which includes automated exposure control, adjustment of the mA and/or kV according to patient size and/or use of iterative reconstruction technique. CONTRAST:  172m OMNIPAQUE IOHEXOL 300 MG/ML  SOLN COMPARISON:  CT abdomen and pelvis dated 06/24/2022, CT abdomen dated 02/03/2009 FINDINGS: CT CHEST FINDINGS Cardiovascular: Normal heart size. No significant pericardial fluid/thickening. Coronary artery calcifications. Great vessels are normal in course and caliber. Filling defect within the proximal superior lingular artery (504:171) appears eccentric along the superior aspect of the vessel. Mediastinum/Nodes: thyroid gland without nodules meeting criteria for imaging follow-up by size. Nonspecific mural thickening of the distal esophagus. No pathologically enlarged axillary, supraclavicular, mediastinal, or hilar lymph nodes. Lungs/Pleura: The central airways are patent. There are secretions within the trachea and bilateral proximal main bronchi. Bibasilar  subsegmental atelectasis. Accessory azygous fissure. 2 mm left lower lobe nodule (505:86) is unchanged from 20/10, likely benign. No focal consolidation. No pneumothorax. Similar small left and trace right pleural effusions. Musculoskeletal: No acute or abnormal lytic or blastic osseous lesions. CT ABDOMEN PELVIS FINDINGS Hepatobiliary: No focal hepatic lesions. No intra or extrahepatic biliary ductal dilation. Normal gallbladder. Pancreas: 5 mm hypodensity in the pancreatic neck (5:65) no main ductal dilation. Spleen: Normal in size without focal abnormality. Adrenals/Urinary Tract: No adrenal nodules. No suspicious renal mass, calculi, or hydronephrosis. Left interpolar simple/minimally complicated cyst measuring 1.4 cm. Additional subcentimeter hypodensities, too small to characterize. No focal bladder wall thickening. Stomach/Bowel: Enteric tube terminates in the stomach. Nonspecific mural thickening of the gastric antrum/pylorus, which may be related to underdistention. Mildly dilated proximal small bowel loops with gradual caliber transition to the level of the lower abdomen, where there are small bowel loops with irregular mural thickening, for example series 2, image 100, likely serosal implants. normal appendix. Vascular/Lymphatic: Aortic atherosclerosis. Slightly asymmetrically expanded appearance of the right common iliac vein (2:99) with central rounded hypoattenuation extending to the level of the infrarenal IVC, which persists on the delayed images (7:37). 10 mm peripancreatic lymph node (2:60). Reproductive: Mildly enlarged prostate gland. Other: Decreased small volume ascites. Similar irregular thickening and nodularity of the peritoneum and omentum. Trace pneumoperitoneum, likely postsurgical. Musculoskeletal: No acute or abnormal lytic or blastic osseous findings. Diffuse subcutaneous edema. Trace subcutaneous emphysema, likely postsurgical. Small fat-containing left inguinal hernia. IMPRESSION: 1.  Pulmonary embolus within the proximal superior lingular artery appears eccentric, suggesting a degree of chronicity. 2. Slightly asymmetrically expanded appearance of the right common iliac vein with central rounded hypoattenuation extending to the level of the infrarenal IVC, which persists on the delayed images, suspicious for deep venous thrombosis. 3. Similar irregular thickening and nodularity of the peritoneum and omentum in keeping with peritoneal carcinomatosis. 4. Mildly dilated proximal small bowel loops with gradual caliber transition to the level of the lower abdomen, where there are small bowel loops with irregular  mural thickening, likely serosal implants resulting in partial bowel obstruction/ileus. 5. Nonspecific mural thickening of the distal esophagus and gastric antrum/pylorus, which may be related to underdistention. Consider endoscopic evaluation. 6. A 10 mm peripancreatic lymph node is suspicious for metastasis. 7. Decreased small volume ascites. 8. Similar small left and trace right pleural effusions. 9. A 5 mm hypodensity in the pancreatic neck, possibly a small side branch IPMN. Recommend attention on follow-up. 10. Coronary artery calcifications. Aortic Atherosclerosis (ICD10-I70.0). Critical Value/emergent results were called by telephone at the time of interpretation on 06/24/2022 at 4:05 pm to provider Antonieta Pert, who verbally acknowledged these results. Electronically Signed   By: Darrin Nipper M.D.   On: 06/24/2022 16:07   DG Abd Portable 1V  Result Date: 06/23/2022 CLINICAL DATA:  No NG output EXAM: PORTABLE ABDOMEN - 1 VIEW COMPARISON:  06/23/2022 FINDINGS: NG tube is kinked within the stomach. There are several mildly dilated loops of small bowel throughout the abdomen. No gross free intraperitoneal air. No acute bony findings. IMPRESSION: 1. NG tube is kinked within the stomach.  Recommend repositioning. 2. Several mildly dilated loops of small bowel throughout the abdomen, which  may reflect ileus versus obstruction. Electronically Signed   By: Davina Poke D.O.   On: 06/23/2022 09:40   DG CHEST PORT 1 VIEW  Result Date: 06/23/2022 CLINICAL DATA:  No NG tube output EXAM: PORTABLE CHEST 1 VIEW COMPARISON:  None Available. FINDINGS: Nasogastric tube extends into the stomach. The tube is kinked 90 degrees at the level of the side port within the stomach. Heart size within normal limits. Streaky bibasilar opacities. No appreciable pleural fluid collection, although the left costophrenic angle was excluded from the field of view. No pneumothorax. IMPRESSION: 1. Nasogastric tube extends into the stomach. The tube is kinked 90 degrees at the level of the side port within the stomach. Recommend repositioning. 2. Streaky bibasilar opacities, likely atelectasis. Electronically Signed   By: Davina Poke D.O.   On: 06/23/2022 09:39   DG Abd Portable 1V  Result Date: 06/23/2022 CLINICAL DATA:  Nausea and vomiting in a 71 year old male. EXAM: PORTABLE ABDOMEN - 1 VIEW COMPARISON:  CT of the abdomen and pelvis dated June 21, 2022. FINDINGS: External portion of gastric tube projects over the upper abdomen. Distal tip of this tube is not visible on the current abdominal radiograph. There is scattered loops of gas-filled small bowel and scattered areas of gas-filled colon seen throughout the abdomen in a nonspecific pattern. No sign of abnormal calcification. Spinal degenerative changes and degenerative changes about the bilateral hips. No acute regional skeletal findings. IMPRESSION: 1. External portion of gastric tube projects over the upper abdomen. Distal tip of this tube is not visible on the current abdominal radiograph. Consider correlation with chest radiography to determine whether this tube may be in the distal esophagus. Position could not be assessed on the current radiograph. 2. Nonspecific bowel gas pattern favors ileus. Electronically Signed   By: Zetta Bills M.D.   On:  06/23/2022 08:10   CT ABDOMEN PELVIS WO CONTRAST  Result Date: 07/02/2022 CLINICAL DATA:  Umbilical hernia surgery 06/08/2022. Abdominal pain and distention with nausea vomiting over the last 2 days. EXAM: CT ABDOMEN AND PELVIS WITHOUT CONTRAST TECHNIQUE: Multidetector CT imaging of the abdomen and pelvis was performed following the standard protocol without IV contrast. RADIATION DOSE REDUCTION: This exam was performed according to the departmental dose-optimization program which includes automated exposure control, adjustment of the mA and/or kV according to patient  size and/or use of iterative reconstruction technique. COMPARISON:  02/03/2009 FINDINGS: Lower chest: Small left and trace right pleural effusion. Atelectasis in both lower lobes and along the right hemidiaphragm. Left anterior descending and right coronary artery atherosclerosis. Small type 1 hiatal hernia. Hepatobiliary: Unremarkable Pancreas: Unremarkable Spleen: Unremarkable Adrenals/Urinary Tract: No urinary tract calculi. 1.8 cm hypodense lesion in the left mid upper kidney with internal density of 6 Hounsfield units favoring benign cyst. No further imaging workup of this lesion is indicated. Adrenal glands unremarkable. Stomach/Bowel: Scattered diverticula of the proximal colon. Tubular structure near the cecum on image 49 of series 5 likely represents normal appendix. Mostly nondistended large bowel with a small amount of higher density material probably reflecting oral contrast. Numerous scattered diverticula, difficult to exclude the possibility of diverticulitis given the surrounding mesenteric and omental infiltration as well as ascites. Mildly dilated loops of small bowel up to about 3.1 cm in the left lower quadrant on image 66 of series 2. Scattered air-fluid levels within these loops and in nondilated loops of small bowel, nonspecific for ileus versus early obstruction. Vascular/Lymphatic: Atherosclerosis is present, including  aortoiliac atherosclerotic disease. There is some atheromatous calcification at the origins of the celiac trunk and SMA. Today's exam is performed without IV contrast and does not characterize patency of the mesenteric vessels. Reproductive: Unremarkable Other: Extensive somewhat high density ascites with infiltration of the omentum and mesentery, with some potential higher density and nodular elements raising the possibility of peritoneal spread of malignancy. For example, the higher density nodularity along the sigmoid colon mesentery on image 78 of series 2. Hemoperitoneum with widespread inflammation is a differential diagnostic consideration. Musculoskeletal: No specific complicating feature related to the umbilical hernia repair is directly identified. Lower lumbar spondylosis and degenerative disc disease with impingement bilaterally at L4-5 and L5-S1, and a transitional S1 vertebra. IMPRESSION: 1. Extensive high density ascites with infiltration of the omentum and mesentery, with some potential higher density and nodular elements raising the possibility of peritoneal spread of malignancy. Hemoperitoneum with widespread inflammation is a differential diagnostic consideration. 2. Mildly dilated loops of small bowel in the left lower quadrant with scattered air-fluid levels, nonspecific for ileus versus early obstruction. 3. Small left and trace right pleural effusions with bibasilar atelectasis. 4. Small type 1 hiatal hernia. 5. Coronary atherosclerosis. 6. Lower lumbar spondylosis and degenerative disc disease causing impingement bilaterally at L4-5 and L5-S1, and a transitional S1 vertebra. Aortic Atherosclerosis (ICD10-I70.0). Electronically Signed   By: Van Clines M.D.   On: 06/24/2022 17:10    I spent 80 minutes for this patient encounter including review of prior medical records/discussing diagnostics and treatment plan with the patient/family/coordinate care with primary/other specialits with  greater than 50% of time in face to face encounter.   Electronically signed by:   Rosiland Oz, MD Infectious Disease Physician [facility] Pager: [phone]

## 2022-07-07 NOTE — Progress Notes (Signed)
OT Cancellation Note  Patient Details Name: Cameron Rice MRN: [id number] DOB: 1950/11/09   Cancelled Treatment:    Reason Eval/Treat Not Completed: Medical issues which prohibited therapy. Patient continues to be intubated and being worked up for perforated bowel. Will check on patient Monday to assess medical readiness for therapy.   Shavonta Gossen L Mishika Flippen 07/07/2022, 7:48 AM

## 2022-07-07 NOTE — Procedures (Signed)
Paracentesis Procedure Note  Tyriek L Martinique  [id number]  1951/01/19  Date:07/07/22  Time:4:36 PM   Provider Performing:Mazella Deen B Arlander Gillen    Procedure: Paracentesis with imaging guidance (35329)  Indication(s) Ascites  Consent Risks of the procedure as well as the alternatives and risks of each were explained to the patient and/or caregiver.  Consent for the procedure was obtained and is signed in the bedside chart  Anesthesia Topical only with 1% lidocaine    Time Out Verified patient identification, verified procedure, site/side was marked, verified correct patient position, special equipment/implants available, medications/allergies/relevant history reviewed, required imaging and test results available.   Sterile Technique Maximal sterile technique including full sterile barrier drape, hand hygiene, sterile gown, sterile gloves, mask, hair covering, sterile ultrasound probe cover (if used).   Procedure Description Ultrasound used to identify appropriate peritoneal anatomy for placement and overlying skin marked.  Area of drainage cleaned and draped in sterile fashion. Lidocaine was used to anesthetize the skin and subcutaneous tissue.  3,250 cc's of cloudy amber appearing fluid was drained. Catheter then removed and bandaid applied to site.   Complications/Tolerance None; patient tolerated the procedure well.   EBL Minimal   Specimen(s) Peritoneal fluid

## 2022-07-07 NOTE — Progress Notes (Signed)
Pharmacy Antibiotic Note  Cameron Rice is a 71 y.o. male admitted on 87/56/4332 with complications from widespread intraperitoneal malignancy. Patient unfortunately aspirated on 11/26 and was treated with Zosyn. Due to continued decline Pharmacy has been consulted to broaden antibiotics to vancomycin and meropenem. Vancomycin stopped 11/29.  Also noted to have been started on empiric antifungal coverage per CCM.  Plan: Continue Meropenem 1g IV q8 hr Mycamine per MD; dosing appropriate  Height: '5\' 11"'$  (180.3 cm) Weight: 81.2 kg (179 lb 0.2 oz) IBW/kg (Calculated) : 75.3  Temp (24hrs), Avg:100.5 F (38.1 C), Min:98.1 F (36.7 C), Max:101.8 F (38.8 C)  Recent Labs  Lab 07/03/22 1050 07/03/22 1855 07/04/22 0507 07/04/22 0905 07/04/22 1045 07/04/22 1600 07/04/2022 0523 06/28/2022 1606 07/06/22 0002 07/07/22 0348 07/07/22 0546  WBC  --  25.6* 19.4*  --   --  26.8* 29.9* 37.5* 40.1* 69.7*  --   CREATININE  --  1.60* 1.01  --   --   --  1.07  --  0.78 0.98  --   LATICACIDVEN 2.6*  --   --  2.1* 2.6* 3.7*  --   --   --   --  1.9     Estimated Creatinine Clearance: 75.8 mL/min (by C-G formula based on SCr of 0.98 mg/dL).    No Known Allergies  Antimicrobials this admission: 11/15 zosyn >> 11/21; restarted 11/27 >>11/28 11/28 vancomycin >> 11/29 11/28 meropenem >> 11/28 micafungin >>   Microbiology results: 11/28 tracheal aspirate: abundant E. Coli; repeating susceptibility 11/27 peritoneal fluid: ngtd 11/26 BCx: ngtd 11/24 peritoneal: NGF 11/16 BCx: NGF 11/16 MRSA PCR: negative    Thank you for allowing pharmacy to be a part of this patient's care.  Royetta Asal, PharmD, BCPS Clinical Pharmacist [place] Please utilize Amion for appropriate phone number to reach the unit pharmacist ([place]) 07/07/2022 8:11 AM

## 2022-07-07 NOTE — Progress Notes (Addendum)
[facility] Progress Note Patient Name: Sherrill L [place]ique DOB: 05/19/1951 MRN: [id number]   Date of Service  07/07/2022  HPI/Events of Note  Leukocytosis - WBC = 40.1 --> 69.7. Abdomen is distended and firm. Nursing concerned for perforated bowel.  Abdomen/Pelvis CT Scan from 07/03/2022 revealed marked abnormalities including:  1. Small-bowel obstruction with multiple thickened small bowel segments interspersed with nonthickened segments and no single transitional segment identified. Obstructive etiology could be due to infiltrating disease with irregular lower abdominal small bowel thickening possibly indicating serosal implants, or due to enteritis, occult adhesions or occult internal hernia. 2. Thickened distal sigmoid colon which could be due to colitis, underdistention or infiltrating disease. Additional peritoneal implants along the flexures of the colon. 3. Extensive peritoneal and omental carcinomatosis with mild-to-moderate free ascites, less than previously. Patient is already on Abx Rx with Meropenum and Micafungin. Now on Norepinephrine IV infusion at 1 mcg/min. Already has NGT to LIS. Surgery and GI are already consulted and following this patient.  eICU Interventions  Plan: Lactic Acid level STAT. Portable Abdominal film STAT.      Intervention Category Major Interventions: Other:  Rajvi Armentor Cornelia Copa 07/07/2022, 5:00 AM

## 2022-07-07 NOTE — Progress Notes (Signed)
Nutrition Follow-up  DOCUMENTATION CODES:   Non-severe (moderate) malnutrition in context of acute illness/injury  INTERVENTION:  - Continue current TPN at this time, holding lipids for hypertriglyceridemia  108g protein (1.4g/kg), 408g dextrose (GIR 3.49), *no lipids: provides 1820 calories (meeting ~87% of lower end of estimated calorie needs)               - Will need to closely monitor triglycerides for ability to add lipids back.    - TPN management per Pharmacy  - Monitor electrolytes. - Recommend daily weights while on TPN  - Once triglycerides back WNL, recommend adding back lipids to TPN to meet goal calorie needs: 108g protein (1.4g/kg), 326g protein (GIR 2.9), 612 lipids calories (28% of calories) which provides 2154 calories   NUTRITION DIAGNOSIS:   Moderate Malnutrition related to acute illness (SBO following urethral surgery) as evidenced by energy intake < 75% for > 7 days, mild fat depletion, mild muscle depletion. *ongoing  GOAL:   Patient will meet greater than or equal to 90% of their needs *not being met due to lipids being held in TPN  MONITOR:   Diet advancement, Labs, Weight trends, I & O's (TPN)  REASON FOR ASSESSMENT:   Consult New TPN/TNA  ASSESSMENT:   71 y.o. with history of hypertension-on Lotrel 10-20, HLD on Lipitor 40,recurrent UTI BPH history of urethral surgery who apparently had umbilical hernia repair on November 2 and since then he has been having abdominal distention and not feeling well, and having nausea vomiting x2 days and abdominal distention and pain  11/15 admitted, NPO 11/16: s/p dx lap 11/21: TPN initiated  11/26: NG tube discontinued, diet adv to clear liquids - later in afternoon aspiration event and rapid respond called 11/27 transferred to ICU, NG replaced 11/28 Intubated in afternoon 11/30 triglycerides noted to be >900 so lipids held in TPN  Patient remains intubated.  Had been tolerating goal TPN however  triglycerides trended up significantly on 11/30 (>900). Pt noted to have been on propofol 11/29. Lipids held from TPN starting 11/30 for hypertriglyceridemia..  Plan to continue holding lipids today, monitoring triglycerides closely for ability to add lipids back as current TPN not meeting goal calories.  Medications reviewed and include: Colace, Insulin, Miralax,    Labs reviewed:  Phosphorus 2.4  Latest Reference Range & Units 06/28/22 00:43 07/03/22 05:00 07/04/22 05:00 06/20/2022 05:23 07/01/2022 08:35 07/06/22 00:02  Triglycerides <150 mg/dL 189 (H) 243 (H) 321 (H) 922 (H) 961 (H) 977 (H)  (H): Data is abnormally high   Diet Order:   Diet Order             Diet NPO time specified  Diet effective now                   EDUCATION NEEDS:  Education needs have been addressed  Skin:  Skin Assessment: Reviewed RN Assessment  Last BM:  11/28  Height:  Ht Readings from Last 1 Encounters:  07/03/22 5' 11" (1.803 m)   Weight:  Wt Readings from Last 1 Encounters:  07/06/22 81.2 kg   BMI:  Body mass index is 24.97 kg/m.  Estimated Nutritional Needs:  Kcal:  2100-2300 Protein:  95-110g Fluid:  2.1L/day      RD, LDN For contact information, refer to AMiON.   

## 2022-07-07 NOTE — Progress Notes (Signed)
ANTICOAGULATION CONSULT NOTE - Follow Up Consult  Pharmacy Consult for Heparin Indication: pulmonary embolus  No Known Allergies  Patient Measurements: Height: '5\' 11"'$  (180.3 cm) Weight: 81.2 kg (179 lb 0.2 oz) IBW/kg (Calculated) : 75.3 Heparin Dosing Weight: 81.2 kg  Vital Signs: Temp: 101.8 F (38.8 C) (12/01 0645) Temp Source: Esophageal (12/01 0515) BP: 98/65 (12/01 0645) Pulse Rate: 96 (12/01 0645)  Labs: Recent Labs    07/04/22 1830 06/20/2022 0523 06/10/2022 1606 07/06/22 0002 07/06/22 1705 07/07/22 0348  HGB  --  7.7* 7.5* 7.4*  --  7.3*  HCT  --  22.3* 21.9* 22.0*  --  22.5*  PLT 201 128* 128* 132*  --  135*  APTT 35  --   --   --   --  75*  LABPROT 16.7*  --   --   --   --   --   INR 1.4*  --   --   --   --   --   HEPARINUNFRC  --   --   --   --  0.36 0.47  CREATININE  --  1.07  --  0.78  --  0.98     Estimated Creatinine Clearance: 75.8 mL/min (by C-G formula based on SCr of 0.98 mg/dL).  Assessment:  AC/Heme: heparin drip for new PE on CT (and suspicious for right common iliac DVT ). Dopplers negative for DVT. 11/28 new maroon NGO & Hg 7.3> DC UFH  11/30 Hgb 7.4. Plts 132, resume UFH with no bolus, Hep level 0.36 in goal. 12/1 Heparin level therapeutic at 0.47 with heparin infusing at 1700 units/hr; no bleeding or heparin related issues per nurse  Goal of Therapy:  Heparin level 0.3-0.7 units/ml Monitor platelets by anticoagulation protocol: Yes APTT 66-102   Plan:  Continue IV heparin at 1700 units/hr No heparin boluses per MD orders Monitor daily heparin level/aPTT, CBC, signs/symptoms of bleeding Also checking aPTT because TG levels > 360 can interfere with heparin levels    Thank you for allowing pharmacy to be a part of this patient's care.  Royetta Asal, PharmD, BCPS Clinical Pharmacist [place] Please utilize Amion for appropriate phone number to reach the unit pharmacist ([place]) 07/07/2022 7:23 AM

## 2022-07-07 NOTE — Progress Notes (Addendum)
2 Days Post-Op   Subjective/Chief Complaint: Intubated, lightly sedated Increased abd distention overnight - KUB/CXR negative for free air NG 400 cc documented last 24h (compared to 2L the day before) with no increase in bowel function (no BMs).   TMAX 101.8 overnight On low dose levo, on low vent settings (30%5) WBC spiked to 69 Vital signs in last 24 hours: Temp:  [98.1 F (36.7 C)-101.8 F (38.8 C)] 101.8 F (38.8 C) (12/01 0700) Pulse Rate:  [94-115] 106 (12/01 0800) Resp:  [20-38] 36 (12/01 0800) BP: (80-133)/(59-89) 119/81 (12/01 0700) SpO2:  [94 %-99 %] 97 % (12/01 0800) FiO2 (%):  [30 %] 30 % (12/01 0800) Last BM Date : 07/04/22  Intake/Output from previous day: 11/30 0701 - 12/01 0700 In: 3252.3 [I.V.:2492; NG/GT:220; IV Piggyback:540.4] Out: 1775 [Urine:1375; Emesis/NG output:400] Intake/Output this shift: No intake/output data recorded.  Gen:  ill appearing male, laying in bed on ventilatory HEENT: NGT R nare w/ brown effluent  Pulm: ventilated respirations, rhonchi present Abd: firm, increased distention, more tense compared to my exam on 11/29, nontender GU: foley in place Ext: there is some edema of all extremities  Neuro: F/C on vent   Lab Results:  Recent Labs    07/06/22 0002 07/07/22 0348  WBC 40.1* 69.7*  HGB 7.4* 7.3*  HCT 22.0* 22.5*  PLT 132* 135*   BMET Recent Labs    07/06/22 0002 07/07/22 0348  NA 140 140  K 4.6 4.6  CL 107 109  CO2 27 24  GLUCOSE 160* 148*  BUN 45* 49*  CREATININE 0.78 0.98  CALCIUM 8.0* 8.0*   PT/INR Recent Labs    07/04/22 1830  LABPROT 16.7*  INR 1.4*   ABG Recent Labs    06/25/2022 0902 07/06/22 1832  PHART 7.47* 7.49*  HCO3 26.9 25.1    Studies/Results: DG CHEST PORT 1 VIEW  Result Date: 07/07/2022 CLINICAL DATA:  Acute respiratory failure EXAM: PORTABLE CHEST 1 VIEW COMPARISON:  07/06/2022 FINDINGS: Enteric tube with tip just below the clavicular heads. Left PICC and left IJ line with tips  at the upper right atrium. The enteric tube reaches the stomach. Low volume chest with streaky density at the bases. No edema, effusion, or pneumothorax. IMPRESSION: 1. Unremarkable hardware as described. 2. Low volume chest with unchanged atelectasis at the bases. Electronically Signed   By: Jorje Guild M.D.   On: 07/07/2022 05:41   DG Abd Portable 1V  Result Date: 07/07/2022 CLINICAL DATA:  Abdominal distension EXAM: PORTABLE ABDOMEN - 1 VIEW COMPARISON:  Yesterday.  Abdominal CT 07/03/2022 FINDINGS: Enteric contrast has partially cleared from the colon. Enteric tube with flexion at the side-port. Hazy appearance of the abdomen correlating with ascites on recent CT. IMPRESSION: 1. Hazy abdomen with ascites based on recent CT. There were also fluid distended small bowel loops by CT which would be in apparent on radiography. 2. Located enteric tube with kinking at the proximal side port. Electronically Signed   By: Jorje Guild M.D.   On: 07/07/2022 05:40   VAS [place] UPPER EXTREMITY ARTERIAL DUPLEX  Result Date: 07/06/2022  UPPER EXTREMITY DUPLEX STUDY Patient Name:  Cameron Rice  Date of Exam:   07/06/2022 Medical Rec #: [id number]         Accession #:    [id number] Date of Birth: [birth year]         Patient Gender: M Patient Age:   71 years Exam Location:  [facility] Procedure:  VAS [place] UPPER EXTREMITY ARTERIAL DUPLEX Referring Phys: Velna Hatchet OLLIS --------------------------------------------------------------------------------  Indications: Cold/dusky hand. History:     Patient has a history of Right radial Aline.  Risk Factors: Hypertension. Comparison Study: No prior studies. Performing Technologist: Oliver Hum RVT  Examination Guidelines: A complete evaluation includes B-mode imaging, spectral Doppler, color Doppler, and power Doppler as needed of all accessible portions of each vessel. Bilateral testing is considered an integral part of a complete examination. Limited  examinations for reoccurring indications may be performed as noted.  Right Doppler Findings: +---------------+----------+---------+--------+--------+ Site           PSV (cm/s)Waveform StenosisComments +---------------+----------+---------+--------+--------+ Subclavian Mid 74        triphasic                 +---------------+----------+---------+--------+--------+ Subclavian Dist          triphasic                 +---------------+----------+---------+--------+--------+ Axillary       84        triphasic                 +---------------+----------+---------+--------+--------+ Brachial Prox  98        triphasic                 +---------------+----------+---------+--------+--------+ Brachial Dist  73        triphasic                 +---------------+----------+---------+--------+--------+ Radial Prox    53        triphasic                 +---------------+----------+---------+--------+--------+ Radial Mid     56        triphasic                 +---------------+----------+---------+--------+--------+ Radial Dist    49        triphasic                 +---------------+----------+---------+--------+--------+ Ulnar Prox     92        triphasic                 +---------------+----------+---------+--------+--------+ Ulnar Mid      63        triphasic                 +---------------+----------+---------+--------+--------+ Ulnar Dist     41        triphasic                 +---------------+----------+---------+--------+--------+ Incidental finding of DVT within one of the paired brachial veins, and the radial veins. SVT noted within the cephalic vein of the mid forearm.   Summary:  Right: No obstruction visualized in the right upper extremity        Incidental finding of DVT within one of the paired brachial        veins, and the radial veins.        SVT noted within the cephalic vein of the mid forearm. *See table(s) above for measurements and  observations. Electronically signed by Jamelle Haring on 07/06/2022 at 5:30:01 PM.    Final    DG Abd 1 View  Result Date: 07/06/2022 CLINICAL DATA:  NGT placement EXAM: ABDOMEN - 1 VIEW COMPARISON:  06/25/2022. FINDINGS: Limited examination of the upper abdomen. NG tube tip superimposed with the stomach below diaphragm. IMPRESSION: NGT in place. Electronically Signed  By: Sammie Bench M.D.   On: 07/06/2022 11:39   DG CHEST PORT 1 VIEW  Result Date: 07/06/2022 CLINICAL DATA:  Acute respiratory failure with hypoxia. Endotracheally intubated. EXAM: PORTABLE CHEST 1 VIEW COMPARISON:  07/04/2022 FINDINGS: Patient is rotated to the left on the current exam. Support lines and tubes in appropriate position. Low lung volumes are again demonstrated. Infiltrate or atelectasis at both lung bases shows no significant change. IMPRESSION: No significant change in bibasilar atelectasis versus infiltrates. Electronically Signed   By: Marlaine Hind M.D.   On: 07/06/2022 08:50   DG CHEST PORT 1 VIEW  Result Date: 07/06/2022 CLINICAL DATA:  Endotracheal tube adjustment EXAM: PORTABLE CHEST 1 VIEW COMPARISON:  07/06/2022 at 0456 hours FINDINGS: Endotracheal tube terminates 3.6 cm above the carina. Enteric tube is positioned within the stomach. Left PICC line and left IJ approach central line both terminate near the superior cavoatrial junction. Stable heart size. Low lung volumes with bibasilar opacities. Probable trace right pleural effusion. No pneumothorax. IMPRESSION: 1. Endotracheal tube terminates 3.6 cm above the carina. 2. Low lung volumes with bibasilar opacities. Probable trace right pleural effusion. Electronically Signed   By: Davina Poke D.O.   On: 07/06/2022 08:27   DG Abd 1 View  Result Date: 06/14/2022 CLINICAL DATA:  Enteric catheter placement EXAM: ABDOMEN - 1 VIEW COMPARISON:  07/03/2022 FINDINGS: Frontal view of the lower chest and upper abdomen demonstrates enteric catheter tip and side  port projecting over the gastric fundus. Paucity of small bowel gas. Residual contrast within the colon. Patchy bibasilar lung consolidation is noted. IMPRESSION: 1. Enteric catheter tip projecting over the gastric fundus. Electronically Signed   By: Randa Ngo M.D.   On: 07/01/2022 16:24    Anti-infectives: Anti-infectives (From admission, onward)    Start     Dose/Rate Route Frequency Ordered Stop   07/04/22 2200  vancomycin (VANCOREADY) IVPB 750 mg/150 mL  Status:  Discontinued        750 mg 150 mL/hr over 60 Minutes Intravenous Every 12 hours 07/04/22 1458 06/23/2022 1136   07/04/22 1600  micafungin (MYCAMINE) 100 mg in sodium chloride 0.9 % 100 mL IVPB        100 mg 105 mL/hr over 1 Hours Intravenous Every 24 hours 07/04/22 1439     07/04/22 1600  vancomycin (VANCOCIN) IVPB 1000 mg/200 mL premix        1,000 mg 200 mL/hr over 60 Minutes Intravenous  Once 07/04/22 1458 07/04/22 1736   07/04/22 1545  meropenem (MERREM) 1 g in sodium chloride 0.9 % 100 mL IVPB        1 g 200 mL/hr over 30 Minutes Intravenous Every 8 hours 07/04/22 1458     07/03/22 0300  piperacillin-tazobactam (ZOSYN) IVPB 3.375 g  Status:  Discontinued        3.375 g 12.5 mL/hr over 240 Minutes Intravenous Every 8 hours 07/03/22 0222 07/04/22 1417   07/02/2022 0200  piperacillin-tazobactam (ZOSYN) IVPB 3.375 g  Status:  Discontinued       See Hyperspace for full Linked Orders Report.   3.375 g 12.5 mL/hr over 240 Minutes Intravenous Every 8 hours 06/18/2022 1849 06/27/22 1232   06/30/2022 1900  piperacillin-tazobactam (ZOSYN) IVPB 3.375 g       See Hyperspace for full Linked Orders Report.   3.375 g 100 mL/hr over 30 Minutes Intravenous  Once 06/16/2022 1849 07/03/2022 1940       Assessment/Plan: Post operative abdominal pain with ascites after umbilical hernia  repair with mesh by Dr. Rosendo Gros 06/08/22  -POD# 37 s/p LAPAROSCOPY DIAGNOSTIC, BIOPSY OF OMENTAL AND PERITONEAL NODULES, ASPIRATION OF ASCITES FOR CYTOLOGY 11/16  Dr. Ninfa Linden - pathology and cytology resulted - moderately differentiated adenocarcinoma - CT C/A/P 11/18 showed chronic PE and right common iliac DVT - now on heparin gtt with no sign of bleeding - CT abdomen 11/18 w/ evidence of peritoneal carcinomatosis but no obvious sign of primary tumor.  No pulmonary tumor or mets noted. Repeat ct scan 11/27 shows new obstructive pattern (SBO), with multiple separate areas of bowel wall thickening, likely related to carcinomatosis.  - Upper endoscopy 11/29 with non-bleeding esophagitis as well as gastritis, stomach biopsied, negative for malignancy. - continue NG to LIWS.  - unfortunately I do not think there are any surgical treatment options for this patient in his current condition. He has known carcinomatosis now with multiple areas of small bowel thickening concerning for tumor implantation with an SBO pattern on CT.  No definitive transition point on his CT scan but there appears to be some of his ileum that is decompressed. His septic shock, respiratory failure, and overall hemodynamics would need to improve prior to considering surgery which would be palliative laparotomy with ileostomy. If dense adhesions or significant tumor burden were encountered intra-operatively then diverting ileostomy may not be possible at all.  Would continue decompression with NG tube - concerned NG tube not functioning well this AM. Reviewed last 3 x-rays - appears to be kinked on all 3 but, until yesterday, was adequately decompressing the bowel. Today his abdomen is tense and NG output dropped off. Recommend exchanging NG tube. CCM plans to perform bedside U/S to see if he needs another para. No free air on x-rays.  Patient and family would like to be transferred to [facility] or [facility]. In his current condition, the patient is not medically stable for transfer to another facility,. [facility] will follow closely.   - Appreciate CCM mgmt of acute respiratory failure, aspiration PNA, and  acute renal failure (improved today)    ID - WBC  69, up from 40; Zosyn 11/15-11/21, Zosyn 11/27-11/28, vanc and merrem 11/28 >> FEN - NPO, NG LIWS, TPN VTE - hep gtt Foley - d/c 11/17, replaced 11/28  HTN HLD BPH Hx UTI    Jill Alexanders 07/07/2022

## 2022-07-07 NOTE — Progress Notes (Signed)
PT Cancellation Note  Patient Details Name: Mauricio L [place] MRN: [id number] DOB: 09/07/1950   Cancelled Treatment:     PT deferred, pt continues on vent.  Will follow.   Joycelin Radloff 07/07/2022, 11:42 AM

## 2022-07-07 NOTE — Progress Notes (Signed)
PHARMACY - TOTAL PARENTERAL NUTRITION CONSULT NOTE   Indication: SBO with Prolonged ileus  Patient Measurements: Height: _0  (180.3 cm) Weight: 81.2 kg (179 lb 0.2 oz) IBW/kg (Calculated) : 75.3 TPN AdjBW (KG): 76.2 Body mass index is 24.97 kg/m. Usual Weight:    Assessment:  Post operative abdominal pain with ascites after umbilical hernia repair with mesh on 06/08/22.   On 11/16 he had Lap biopsy and ascites aspiration.  Now has ileus with SBO.  Glucose / Insulin: No h/o DM noted. A1C 4.8.  11/28 hydrocortisone 100 mg IV q12 added CBGs prior to adding stress steroids < 180  CBGs 137- 160, past 24 hrs - resistant SSI used: 12 units last 24h Solucortef 100 q12 decreased to 100 qday x 2 doses to end 12/2 am dose Electrolytes: Na 140 (none in TPN), K 4.6 , Mag 2.1 (goal K>=4 and Mg>=2 with ileus), Phos 2.4 (replace if </= 2.7), CoCa 9.68 Renal: h/o BPH (holding Flomax while on pressors) SCr WNL, BUN 49  Hepatic: AST/ALT/Alk phos: OK.  Tbili 3.2 up, no jaundice reported; Triglycerides 189> 243> 321>> 922 today w/ addition of propofol yesterday- repeat Trig 961>changed propofol to precedex,11/29.  Trig 977 11/30 Albumin 1.9 Intake / Output; MIVF:  Output:  NG 100 mls/24 hrs, UOP 1375 mls/24hr, LBM 11/28 11/29 lasix 20 IV x 1 per CCM 11/30 lasix 20 IV x 1 per CCM GI Imaging: 11/15: CT: Mildly dilated loops of small bowel in the left lower quadrant with scattered air-fluid levels, nonspecific for ileus versus early obstruction 11/17: Xray: ileus vs obstruction 11/18: CT:  peritoneal carcinomatosis,  partial bowel obstruction/ileus 11/27 CT: SBO, extensive peritoneal & omental carcinomatosis w/ free ascites (less than prev)  GI Surgeries / Procedures:  11/2 hernia repair 11/16 diagnostic laparoscopy 11/29 EGD: esophagitis, no bleeding; non-bleeding gastric ulcers  Central access: PICC 11/21 TPN start date: 11/21  Nutritional Goals: Goal TPN rate is 85 mL/hr to provide 108 g of  protein and 2154 kcals per day)  RD Assessment: Estimated Needs Total Energy Estimated Needs: 2100-2300 Total Protein Estimated Needs: 95-110g Total Fluid Estimated Needs: 2.1L/day  Current Nutrition:  NPO and TPN  Plan:  Now: Sodium Phosphate 15 mMol IV x 1  Continue TPN at goal rate 3m/hr at 1800.( Provides 108 gm protein & ~ 1820 kcals)  Remove lipids from TPN due to elevated Trig and increase dextrose to 20%  Electrolytes in TPN: Na 094m/L, K 55 mEq/L, Ca 22m63mL, Mg 2mE67m, and Phos 10mm12m. Cl:Ac 1:1  Add standard MVI and trace elements to TPN Continue q4h resistant SSI No IVFs, per MD Monitor TPN labs on Mon/Thurs and PRN, BMET, Mg & Phos in AM    Thank you for allowing pharmacy to be a part of this patient's care.  MelisRoyetta AsalrmD, BCPS Clinical Pharmacist [place] Please utilize Amion for appropriate phone number to reach the unit pharmacist ([facility] PhTerry1/2023 7:36 AM

## 2022-07-07 DEATH — deceased

## 2022-07-08 DIAGNOSIS — K56609 Unspecified intestinal obstruction, unspecified as to partial versus complete obstruction: Secondary | ICD-10-CM | POA: Diagnosis not present

## 2022-07-08 LAB — COMPREHENSIVE METABOLIC PANEL
ALT: 67 U/L — ABNORMAL HIGH (ref 0–44)
AST: 50 U/L — ABNORMAL HIGH (ref 15–41)
Albumin: 3.2 g/dL — ABNORMAL LOW (ref 3.5–5.0)
Alkaline Phosphatase: 91 U/L (ref 38–126)
Anion gap: 7 (ref 5–15)
BUN: 46 mg/dL — ABNORMAL HIGH (ref 8–23)
CO2: 23 mmol/L (ref 22–32)
Calcium: 8 mg/dL — ABNORMAL LOW (ref 8.9–10.3)
Chloride: 112 mmol/L — ABNORMAL HIGH (ref 98–111)
Creatinine, Ser: 0.89 mg/dL (ref 0.61–1.24)
GFR, Estimated: >60 mL/min (ref >60)
Glucose, Bld: 111 mg/dL — ABNORMAL HIGH (ref 70–99)
Potassium: 3.8 mmol/L (ref 3.5–5.1)
Sodium: 142 mmol/L (ref 135–145)
Total Bilirubin: 4.1 mg/dL — ABNORMAL HIGH (ref 0.3–1.2)
Total Protein: 6 g/dL — ABNORMAL LOW (ref 6.5–8.1)

## 2022-07-08 LAB — GLUCOSE, CAPILLARY
Glucose-Capillary: 139 mg/dL — ABNORMAL HIGH (ref 70–99)
Glucose-Capillary: 139 mg/dL — ABNORMAL HIGH (ref 70–99)
Glucose-Capillary: 123 mg/dL — ABNORMAL HIGH (ref 70–99)
Glucose-Capillary: 123 mg/dL — ABNORMAL HIGH (ref 70–99)
Glucose-Capillary: 146 mg/dL — ABNORMAL HIGH (ref 70–99)
Glucose-Capillary: 132 mg/dL — ABNORMAL HIGH (ref 70–99)

## 2022-07-08 LAB — CBC
HCT: 21.5 % — ABNORMAL LOW (ref 39.0–52.0)
Hemoglobin: 6.8 g/dL — CL (ref 13.0–17.0)
MCH: 28.5 pg (ref 26.0–34.0)
MCHC: 31.6 g/dL (ref 30.0–36.0)
MCV: 90 fL (ref 80.0–100.0)
Platelets: 120 10*3/uL — ABNORMAL LOW (ref 150–400)
RBC: 2.39 MIL/uL — ABNORMAL LOW (ref 4.22–5.81)
RDW: 18.8 % — ABNORMAL HIGH (ref 11.5–15.5)
WBC: 62.6 10*3/uL (ref 4.0–10.5)
nRBC: 5.8 % — ABNORMAL HIGH (ref 0.0–0.2)

## 2022-07-08 LAB — HEMOGLOBIN AND HEMATOCRIT, BLOOD
HCT: 30.7 % — ABNORMAL LOW (ref 39.0–52.0)
Hemoglobin: 9.8 g/dL — ABNORMAL LOW (ref 13.0–17.0)

## 2022-07-08 LAB — PROCALCITONIN: Procalcitonin: 2.4 ng/mL

## 2022-07-08 LAB — PHOSPHORUS: Phosphorus: 3.3 mg/dL (ref 2.5–4.6)

## 2022-07-08 LAB — HEPARIN LEVEL (UNFRACTIONATED): Heparin Unfractionated: 0.34 IU/mL (ref 0.30–0.70)

## 2022-07-08 LAB — APTT: aPTT: 83 seconds — ABNORMAL HIGH (ref 24–36)

## 2022-07-08 LAB — MAGNESIUM: Magnesium: 2 mg/dL (ref 1.7–2.4)

## 2022-07-08 LAB — PREPARE RBC (CROSSMATCH)

## 2022-07-08 MED ORDER — TRAVASOL 10 % IV SOLN
INTRAVENOUS | Status: AC
  Filled 2022-07-08: qty 1081.2

## 2022-07-08 MED ORDER — SODIUM CHLORIDE 0.9% IV SOLUTION: Freq: Once | INTRAVENOUS | Status: AC

## 2022-07-08 MED ORDER — SODIUM CHLORIDE 0.9 % IV SOLN
2.0000 g | Freq: Three times a day (TID) | INTRAVENOUS | Status: DC
  Administered 2022-07-08 – 2022-07-11 (×9): 2 g via INTRAVENOUS
  Filled 2022-07-08 (×13): qty 40

## 2022-07-08 NOTE — Progress Notes (Signed)
NAME:  Cameron Rice, MRN:  [id number], DOB:  1950-11-04, LOS: 30 ADMISSION DATE:  07/02/2022, CONSULTATION DATE:  07/03/2022 REFERRING MD:  Gershon Cull NP CHIEF COMPLAINT:  Acute resp failure with shock  History of Present Illness:  71 y/o male with PMHx significant for HTN, hyperlipidemia, recurrent UTI and umbilical hernia repair (Nov 2) who presented to [facility] 11/15 with increasing abdominal distention and malaise. Patient reported 2 days of n/v as well as abdominal pain/distention since his repair. He also had not had a bowel movement since the operation. He originally was seen at [facility] where CT A/P revealed possible metastatic disease as well as SBO. He was admitted with general surgery consultation for SBO and suspected intraperitoneal carcinomatosis. He denied any blood in his vomit or stool. No fever/chills.   During this hospitalization he has been since gone back to the OR for diagnostic lap which did reveal peritoneal carcinomatosis (Oncology consulted). He has had an intraoperative paracentesis as well as a subsequent one (11/24). On 11/27, patient again appeared to have large volume ascites. Over the course of his hospitalization he has been treated for SBO with NG decompression. He was also found to have a PE without associated DVT and was started on heparin gtt 11/18. Per surgical notes, he was having some improvement with passing of gas and small BM. His NG was discontinued morning of 11/26 after having tolerated clamping trial with clears. Unfortunately 11/26 PM he required transfer to ICU after being noted to be tachycardic and hypotensive. Repeat CTA Chest 11/26 revealed no additional PE but aspiration as well as large volume ascites and a fluid filled esophagus. Upon exam by TRH, abdomen was quite distended and tympanic. NG was attempted to be placed on floor with subsequent vomiting and obvious aspiration.   Upon arrival to ICU patient remained tachycardic (140s) was given small  volume fluid resuscitation but remained hypotensive with SBP 70s, tachypneic and requiring HHFNC 100% and 40L. He states he is feeling some relief with the NG placement that just occurred and already as ~272m output of dark contents. CCM was consulted for hypotension and concern for need for airway. He is able to talk at this time is oriented and alert. He does have some conversational dyspnea. He states he would want intubation if absolutely necessary. He is agreeable to pressors and would like to remain a full code.   Pertinent Medical History:  HTN Hyperlipidemia Umbilical hernia s/p repair 06/08/22 Peritoneal carcinomatosis unknown primary  Significant Hospital Events: Including procedures, antibiotic start and stop dates in addition to other pertinent events   [facility] Ex lap with frozen sections + for adenocarcinoma, awaiting final path 11/18 Dx with PE started on heparin 11/19 Negative LE dopplers 11/26 Aspiration event  11/27 Transferred to ICU, NG replaced, ongoing tachycardia/hypotension. Delirium in PM prompting CT Head ([facility], CT A/P with SBO, multiple areas of bowel thickening/peritoneal implants, increasing BLL consolidation, anasarca.  11/28 Downtrending WBC/H&H, suspect component of dilution. Hgb 7.3, 1U PRBCs given. CCS/Oncology reengaged. [facility] transfer center contacted. CT Head negative. CT abd/pelvis with SBO, multiple areas of bowel thickening/peritoneal implants, increasing BLL lung consolidation, anasarca. Confusion / delirium late evening.  12/1 paracentesis performed, 3.2L removed   Interim History / Subjective:   No acute events overnight Trialed on 12/5 for 30 minutes this morning Fever curve trending down after precedex stopped   Objective:  Blood pressure 115/75, pulse (!) 117, temperature 98.6 F (37 C), resp. rate (Marland Kitchen  24, height '5\' 11"'$  (1.803 m), weight 81.2 kg, SpO2 98 %. CVP:  [3 mmHg-86 mmHg] 3 mmHg  Vent Mode: PRVC FiO2  (%):  [30 %] 30 % Set Rate:  [18 bmp] 18 bmp Vt Set:  [600 mL] 600 mL PEEP:  [5 cmH20] 5 cmH20 Plateau Pressure:  [19 cmH20-37 cmH20] 21 cmH20   Intake/Output Summary (Last 24 hours) at 07/08/2022 0750 Last data filed at 07/08/2022 0700 Gross per 24 hour  Intake 4217.81 ml  Output 6750 ml  Net -2532.19 ml   Filed Weights   07/04/22 0500 06/24/2022 0351 07/06/22 0515  Weight: 76.4 kg 79.9 kg 81.2 kg   Physical Exam: General: critically ill appearing adult male on vent  HEENT: ETT/NG in place  Neuro: Awakens, following commands, moving all extremities CV: Tachycardic, no mRG  PULM: coarse breath sounds, mild accessory muscles  GI: mildly distended, tight, absent bowel sounds Extremities: warm/dry, trace to 1+ edema.  Skin: no rashes or lesions.   Resolved Hospital Problem List:   Lactic Acidosis Acute Renal Failure secondary to ATN, prerenal AKI  Assessment & Plan:   Septic Shock secondary to Aspiration, with E.Coli PNA  Continue Febrile state and uptrending WBC in setting of stress dose steroids, sepsis along with precedex. Fever curve down after stopping precedex. Relative Adrenal Insufficiency - stopped stress dose steroids 12/1 Plan -Cardiac Monitoring. MAP goal >65. Remains off vasopressors  -continue meropenem, micafungin -Follow up cultures  Acute Hypoxic Respiratory Failure Acute PE  Aspiration Pneumonia Echo 11/27 with LVEF 60-65%, mildly D-shaped septum c/f RV pressure/volume overload. Plan -PRVC with LTVV  -wean PEEP / FiO2 for sats >90% -abx as above  -Continue heparin without bolus -Titrate fentanyl and PRN versed for RASS goal 0/-1   Peritoneal Carcinomatosis Ascites, malignant in nature Bacterial peritonitis SBO -Path/cyto with moderately differentiated adenocarcinoma. Surgery/Oncology following.  -CT A/P 11/27 demonstrating SBO, multiple areas of bowel thickening/peritoneal implants, increasing BLL consolidation, anasarca. EGD on 11/29 with areas of  ulceration.  -Gastric mucosal biopsies obtained on EGD 06/16/2022 are unrevealing for primary source of peritoneal carcinomatosis  Plan -NPO  -continue TPN  -NGT to LWS -PRN paracentesis. 3.2L removed 12/1 -appreciate CCS, ONC  Hypertriglyceridemia  -On TPN, propofol  -sedation changed 11/29 Plan -follow intermittent triglycerides -prior hx or oral agents at home  -pharmacy to adjust TPN   Anemia, multifactorial Hgb drift 11/27-11/28, CT Head/CT A/P without e/o active bleeding. NGT output darker. Plan -trend H/H  -transfuse for Hgb <7% or active bleeding, none currently  -BID PPI  -follow NGT output   GOC Ongoing GOC discussion necessary in the setting of poor prognosis with widespread peritoneal carcinomatosis (moderately differentiated adeno with unknown primary). Per UpToDate, 1 year survival is 17% in these cases. -would need further stabilization before [facility] would consider intervention  -appreciate ONC evaluation  -continue full scope care per patient wishes  -family would want transfer to [facility] if accepted   Best Practice: (right click and "Reselect all SmartList Selections" daily)  Diet/type: TPN DVT prophylaxis: SCD, held systemic heparin 11/28 AM given Hgb drift GI prophylaxis: PPI Lines: Central line Foley:  Yes, and it is still needed Code Status:  full code Last date of multidisciplinary goals of care discussion: 11/27AM with patient.  Family updated at bedside 12/1  Dispo:  -[facility] contacted again 11/30, requesting face sheet from [facility]  -Prior contact > [facility] contacted 11/28 > Dr. Crisoforo Oxford from surgical oncology [phone].  If can stabilize from resp/cardiac/renal they will consider diverting ostomy  if persistent obs symptoms  Critical care time:  45 minutes    Freda Jackson, MD [facility] Office: [phone]   See Amion for personal pager PCCM on call pager [phone] until 7pm. Please call Elink 7p-7a. [phone]

## 2022-07-08 NOTE — Progress Notes (Addendum)
ID Brief Note   Fever curve tending down, PCCM considering stopping precedex as the possible etiology  WBC went up to 72 , now back to 62 ( in the setting of steroids use)  Concerns for NGT non functioning and plans for NGT exchange as well as possible repeat paracentesis  Off vasopressors.  Remains on meropenem and micafungin ( empirically)  currently for possible intraabdominal source of infection   12/1 blood cx NG in less than 24 hrs  12/1 peritoneal aspirate cx NG in less than 24 hrs   PICC + since 11/21 Left IJ CVC since 11/28  GOC discussion ongoing, possible transfer to tertiary facility for higher level of care.   Given fevers curve as well as WBC trending down, off vasopressors, will not add anti MRSA coverage for now.   Following  Rosiland Oz, MD Infectious Disease Physician [facility] [address] [place], [place] [address] Phone: [phone]  Fax: [phone]

## 2022-07-08 NOTE — Progress Notes (Signed)
3 Days Post-Op   Subjective/Chief Complaint: Intubated and sedated   Objective: Vital signs in last 24 hours: Temp:  [97.7 F (36.5 C)-101.5 F (38.6 C)] 98.6 F (37 C) (12/02 0630) Pulse Rate:  [92-120] 117 (12/02 0630) Resp:  [15-36] 24 (12/02 0630) BP: (73-196)/(58-103) 115/75 (12/02 0630) SpO2:  [95 %-100 %] 98 % (12/02 0630) FiO2 (%):  [30 %] 30 % (12/02 0418) Last BM Date : 07/04/22  Intake/Output from previous day: 12/01 0701 - 12/02 0700 In: 4217.8 [I.V.:3076.6; IV Piggyback:1141.2] Out: 6750 [Urine:3100; Emesis/NG output:400] Intake/Output this shift: No intake/output data recorded.  General appearance: arousable on vent Resp: rhonchi bilaterally and on vent Cardio: tachy GI: distended. quiet  Lab Results:  Recent Labs    07/07/22 1400 07/08/22 0505  WBC 72.5* 62.6*  HGB 7.9* 6.8*  HCT 24.6* 21.5*  PLT 147* 120*   BMET Recent Labs    07/07/22 0348 07/08/22 0505  NA 140 142  K 4.6 3.8  CL 109 112*  CO2 24 23  GLUCOSE 148* 111*  BUN 49* 46*  CREATININE 0.98 0.89  CALCIUM 8.0* 8.0*   PT/INR No results for input(s): "LABPROT", "INR" in the last 72 hours. ABG Recent Labs    06/18/2022 0902 07/06/22 1832  PHART 7.47* 7.49*  HCO3 26.9 25.1    Studies/Results: DG Abd Portable 1V  Result Date: 07/07/2022 CLINICAL DATA:  Encounter for nasogastric tube placement. EXAM: PORTABLE ABDOMEN - 1 VIEW COMPARISON:  Abdominal radiograph 07/07/2022 at 4:51 a.m.; CT abdomen and pelvis FINDINGS: Interval repositioning of enteric tube with the prior flexion at the side port no longer seen. The enteric tube extends approximately 10 cm into the stomach and maintains a straight course without the prior kink. Mild oral contrast is again seen within the transverse and descending colon. Note is again made of fluid distended small bowel loops on prior CT, which would not be well visualized on radiograph. Hazy appearance of the abdomen again correlating with ascites seen  on prior CT. Left basilar heterogeneous airspace lung opacity is grossly similar to prior. Mild calcification within aortic arch. No acute skeletal abnormality. IMPRESSION: Interval repositioning of enteric tube with the prior flexion at the side port no longer visualized. The enteric tube extends approximately 10 cm into the stomach and maintains a straight course without the prior kink. Electronically Signed   By: Yvonne Kendall M.D.   On: 07/07/2022 11:06   DG CHEST PORT 1 VIEW  Result Date: 07/07/2022 CLINICAL DATA:  Acute respiratory failure EXAM: PORTABLE CHEST 1 VIEW COMPARISON:  07/06/2022 FINDINGS: Enteric tube with tip just below the clavicular heads. Left PICC and left IJ line with tips at the upper right atrium. The enteric tube reaches the stomach. Low volume chest with streaky density at the bases. No edema, effusion, or pneumothorax. IMPRESSION: 1. Unremarkable hardware as described. 2. Low volume chest with unchanged atelectasis at the bases. Electronically Signed   By: Jorje Guild M.D.   On: 07/07/2022 05:41   DG Abd Portable 1V  Result Date: 07/07/2022 CLINICAL DATA:  Abdominal distension EXAM: PORTABLE ABDOMEN - 1 VIEW COMPARISON:  Yesterday.  Abdominal CT 07/03/2022 FINDINGS: Enteric contrast has partially cleared from the colon. Enteric tube with flexion at the side-port. Hazy appearance of the abdomen correlating with ascites on recent CT. IMPRESSION: 1. Hazy abdomen with ascites based on recent CT. There were also fluid distended small bowel loops by CT which would be in apparent on radiography. 2. Located enteric tube with  kinking at the proximal side port. Electronically Signed   By: Jorje Guild M.D.   On: 07/07/2022 05:40   VAS [place] UPPER EXTREMITY ARTERIAL DUPLEX  Result Date: 07/06/2022  UPPER EXTREMITY DUPLEX STUDY Patient Name:  Cameron Rice  Date of Exam:   07/06/2022 Medical Rec #: [id number]         Accession #:    [id number] Date of Birth: 1951/01/04          Patient Gender: M Patient Age:   73 years Exam Location:  [facility] Procedure:      VAS [place] UPPER EXTREMITY ARTERIAL DUPLEX Referring Phys: Noe Gens --------------------------------------------------------------------------------  Indications: Cold/dusky hand. History:     Patient has a history of Right radial Aline.  Risk Factors: Hypertension. Comparison Study: No prior studies. Performing Technologist: Oliver Hum RVT  Examination Guidelines: A complete evaluation includes B-mode imaging, spectral Doppler, color Doppler, and power Doppler as needed of all accessible portions of each vessel. Bilateral testing is considered an integral part of a complete examination. Limited examinations for reoccurring indications may be performed as noted.  Right Doppler Findings: +---------------+----------+---------+--------+--------+ Site           PSV (cm/s)Waveform StenosisComments +---------------+----------+---------+--------+--------+ Subclavian Mid 74        triphasic                 +---------------+----------+---------+--------+--------+ Subclavian Dist          triphasic                 +---------------+----------+---------+--------+--------+ Axillary       84        triphasic                 +---------------+----------+---------+--------+--------+ Brachial Prox  98        triphasic                 +---------------+----------+---------+--------+--------+ Brachial Dist  73        triphasic                 +---------------+----------+---------+--------+--------+ Radial Prox    53        triphasic                 +---------------+----------+---------+--------+--------+ Radial Mid     56        triphasic                 +---------------+----------+---------+--------+--------+ Radial Dist    49        triphasic                 +---------------+----------+---------+--------+--------+ Ulnar Prox     92        triphasic                  +---------------+----------+---------+--------+--------+ Ulnar Mid      63        triphasic                 +---------------+----------+---------+--------+--------+ Ulnar Dist     41        triphasic                 +---------------+----------+---------+--------+--------+ Incidental finding of DVT within one of the paired brachial veins, and the radial veins. SVT noted within the cephalic vein of the mid forearm.   Summary:  Right: No obstruction visualized in the right upper extremity        Incidental finding of DVT within  one of the paired brachial        veins, and the radial veins.        SVT noted within the cephalic vein of the mid forearm. *See table(s) above for measurements and observations. Electronically signed by Jamelle Haring on 07/06/2022 at 5:30:01 PM.    Final    DG Abd 1 View  Result Date: 07/06/2022 CLINICAL DATA:  NGT placement EXAM: ABDOMEN - 1 VIEW COMPARISON:  06/18/2022. FINDINGS: Limited examination of the upper abdomen. NG tube tip superimposed with the stomach below diaphragm. IMPRESSION: NGT in place. Electronically Signed   By: Sammie Bench M.D.   On: 07/06/2022 11:39   DG CHEST PORT 1 VIEW  Result Date: 07/06/2022 CLINICAL DATA:  Endotracheal tube adjustment EXAM: PORTABLE CHEST 1 VIEW COMPARISON:  07/06/2022 at 0456 hours FINDINGS: Endotracheal tube terminates 3.6 cm above the carina. Enteric tube is positioned within the stomach. Left PICC line and left IJ approach central line both terminate near the superior cavoatrial junction. Stable heart size. Low lung volumes with bibasilar opacities. Probable trace right pleural effusion. No pneumothorax. IMPRESSION: 1. Endotracheal tube terminates 3.6 cm above the carina. 2. Low lung volumes with bibasilar opacities. Probable trace right pleural effusion. Electronically Signed   By: Davina Poke D.O.   On: 07/06/2022 08:27    Anti-infectives: Anti-infectives (From admission, onward)    Start     Dose/Rate  Route Frequency Ordered Stop   07/04/22 2200  vancomycin (VANCOREADY) IVPB 750 mg/150 mL  Status:  Discontinued        750 mg 150 mL/hr over 60 Minutes Intravenous Every 12 hours 07/04/22 1458 06/16/2022 1136   07/04/22 1600  micafungin (MYCAMINE) 100 mg in sodium chloride 0.9 % 100 mL IVPB        100 mg 105 mL/hr over 1 Hours Intravenous Every 24 hours 07/04/22 1439     07/04/22 1600  vancomycin (VANCOCIN) IVPB 1000 mg/200 mL premix        1,000 mg 200 mL/hr over 60 Minutes Intravenous  Once 07/04/22 1458 07/04/22 1736   07/04/22 1545  meropenem (MERREM) 1 g in sodium chloride 0.9 % 100 mL IVPB        1 g 200 mL/hr over 30 Minutes Intravenous Every 8 hours 07/04/22 1458     07/03/22 0300  piperacillin-tazobactam (ZOSYN) IVPB 3.375 g  Status:  Discontinued        3.375 g 12.5 mL/hr over 240 Minutes Intravenous Every 8 hours 07/03/22 0222 07/04/22 1417   06/14/2022 0200  piperacillin-tazobactam (ZOSYN) IVPB 3.375 g  Status:  Discontinued       See Hyperspace for full Linked Orders Report.   3.375 g 12.5 mL/hr over 240 Minutes Intravenous Every 8 hours 06/17/2022 1849 06/27/22 1232   07/04/2022 1900  piperacillin-tazobactam (ZOSYN) IVPB 3.375 g       See Hyperspace for full Linked Orders Report.   3.375 g 100 mL/hr over 30 Minutes Intravenous  Once 06/26/2022 1849 06/23/2022 1940       Assessment/Plan: s/p Procedure(s) with comments: ESOPHAGOGASTRODUODENOSCOPY (EGD) (N/A) - ICU bedside procedure BIOPSY carcinomatosis Post operative abdominal pain with ascites after umbilical hernia repair with mesh by Dr. Rosendo Gros 06/08/22  -POD# 14 s/p LAPAROSCOPY DIAGNOSTIC, BIOPSY OF OMENTAL AND PERITONEAL NODULES, ASPIRATION OF ASCITES FOR CYTOLOGY 11/16 Dr. Ninfa Linden - pathology and cytology resulted - moderately differentiated adenocarcinoma - CT C/A/P 11/18 showed chronic PE and right common iliac DVT - now on heparin gtt with no sign of  bleeding - CT abdomen 11/18 w/ evidence of peritoneal carcinomatosis  but no obvious sign of primary tumor.  No pulmonary tumor or mets noted. Repeat ct scan 11/27 shows new obstructive pattern (SBO), with multiple separate areas of bowel wall thickening, likely related to carcinomatosis.  - Upper endoscopy 11/29 with non-bleeding esophagitis as well as gastritis, stomach biopsied, negative for malignancy. - continue NG to LIWS.  - unfortunately I do not think there are any surgical treatment options for this patient in his current condition. He has known carcinomatosis now with multiple areas of small bowel thickening concerning for tumor implantation with an SBO pattern on CT.  No definitive transition point on his CT scan but there appears to be some of his ileum that is decompressed. His septic shock, respiratory failure, and overall hemodynamics would need to improve prior to considering surgery which would be palliative laparotomy with ileostomy. If dense adhesions or significant tumor burden were encountered intra-operatively then diverting ileostomy may not be possible at all.  Would continue decompression with NG tube - concerned NG tube not functioning well this AM. Reviewed last 3 x-rays - appears to be kinked on all 3 but, until yesterday, was adequately decompressing the bowel. Today his abdomen is tense and NG output dropped off. Recommend exchanging NG tube. CCM plans to perform bedside U/S to see if he needs another para. No free air on x-rays.  Patient and family would like to be transferred to [facility] or [facility]. In his current condition, the patient is not medically stable for transfer to another facility,. CCS will follow closely.    - Appreciate CCM mgmt of acute respiratory failure, aspiration PNA, and acute renal failure (improved today)     ID - WBC  69, up from 40; Zosyn 11/15-11/21, Zosyn 11/27-11/28, vanc and merrem 11/28 >> FEN - NPO, NG LIWS, TPN VTE - hep gtt Foley - d/c 11/17, replaced 11/28   HTN HLD BPH Hx UTI   LOS: 17 days    Cameron Rice 07/08/2022

## 2022-07-08 NOTE — Progress Notes (Signed)
Performed intra-abdominal pressure test via foley catheter, result was IAP @ 19, MD made aware. Ventura Bruns, RN  07/08/22 6:15 PM

## 2022-07-08 NOTE — Progress Notes (Signed)
Physical Therapy Discharge Patient Details Name: Cameron Rice MRN: [id number] DOB: 1951/03/09 Today's Date: 07/08/2022 Time: 0816    Patient discharged from PT services secondary to medical decline - will need to re-order PT to resume therapy services if/once pt becomes medically stable and appropriate for PT.       Doreatha Massed, PT Acute Rehabilitation  Office: [phone]

## 2022-07-08 NOTE — Progress Notes (Signed)
[place] Progress Note Patient Name: Cameron Rice DOB: 06-Jul-1951 MRN: [id number]   Date of Service  07/08/2022  HPI/Events of Note  Notified of Hgb 6.8 from 7.9 Reportedly with brownish NGT output and urine slightly darker color but neither has frank blood On heparin drip for PE S/p paracentesis with no report of bleed Already on protonix  eICU Interventions  Ordered to transfuse one unit PRBC Bedside team to reassess regarding heparin drip.     Intervention Category Intermediate Interventions: Other:  Cameron Rice 07/08/2022, 6:24 AM

## 2022-07-08 NOTE — Progress Notes (Signed)
Pharmacy Antibiotic Note  Cameron Rice is a 71 y.o. male admitted on 16/05/9603 with complications from widespread intraperitoneal malignancy. Patient unfortunately aspirated on 11/26 and was treated with Zosyn. Due to continued decline Pharmacy has been consulted to broaden antibiotics to vancomycin and meropenem. Vancomycin stopped 11/29.  Also noted to have been started on empiric antifungal coverage per CCM.  Today, Tmax 101.3, worsening leukocytosis, and SCr WNL.  Plan: Increase Meropenem to 2g IV q8 hr Mycamine per MD; dosing appropriate  Height: '5\' 11"'$  (180.3 cm) Weight: 81.2 kg (179 lb 0.2 oz) IBW/kg (Calculated) : 75.3  Temp (24hrs), Avg:99 F (37.2 C), Min:97.7 F (36.5 C), Max:101.3 F (38.5 C)  Recent Labs  Lab 07/03/22 1050 07/03/22 1855 07/04/22 0507 07/04/22 0905 07/04/22 1045 07/04/22 1600 07/03/2022 0523 06/20/2022 1606 07/06/22 0002 07/07/22 0348 07/07/22 0546 07/07/22 1400 07/08/22 0505  WBC  --    < > 19.4*  --   --  26.8* 29.9* 37.5* 40.1* 69.7*  --  72.5* 62.6*  CREATININE  --    < > 1.01  --   --   --  1.07  --  0.78 0.98  --   --  0.89  LATICACIDVEN 2.6*  --   --  2.1* 2.6* 3.7*  --   --   --   --  1.9  --   --    < > = values in this interval not displayed.     Estimated Creatinine Clearance: 83.4 mL/min (by C-G formula based on SCr of 0.89 mg/dL).    No Known Allergies  Antimicrobials this admission: 11/15 zosyn >> 11/21; restarted 11/27 >>11/28 11/28 vancomycin >> 11/29 11/28 meropenem >> 11/28 micafungin >>   Microbiology results: 11/28 tracheal aspirate: abundant E. Coli; repeating susceptibility 11/27 peritoneal fluid: ngtd 11/26 BCx: ngtd 11/24 peritoneal: NGF 11/16 BCx: NGF 11/16 MRSA PCR: negative    Thank you for allowing pharmacy to be a part of this patient's care.  Royetta Asal, PharmD, BCPS Clinical Pharmacist [place] Please utilize Amion for appropriate phone number to reach the unit pharmacist ([place]) 07/08/2022 12:58 PM

## 2022-07-08 NOTE — Progress Notes (Signed)
PHARMACY - TOTAL PARENTERAL NUTRITION CONSULT NOTE   Indication: SBO with Prolonged ileus  Patient Measurements: Height: _0  (180.3 cm) Weight: 81.2 kg (179 lb 0.2 oz) IBW/kg (Calculated) : 75.3 TPN AdjBW (KG): 76.2 Body mass index is 24.97 kg/m. Usual Weight:    Assessment:  Post operative abdominal pain with ascites after umbilical hernia repair with mesh on 06/08/22.   On 11/16 he had Lap biopsy and ascites aspiration.  Now has ileus with SBO.  Glucose / Insulin: No h/o DM noted. A1C 4.8.  11/28 hydrocortisone 100 mg IV q12 added CBGs prior to adding stress steroids < 180  CBGs 111-195, past 24 hrs - resistant SSI used: 22 units last 24h Solucortef 100 q12 decreased to 100 qday x 2 doses to end 12/2 am dose Electrolytes: Na 142 (none in TPN), K down 3.8 and Mag 2.0 (goal K>=4 and Mg>=2 with ileus), Phos 3.3 (replace if </= 2.7), CoCa 8.96 Renal: h/o BPH (holding Flomax while on pressors) SCr WNL, BUN 49  Hepatic: AST/ALT/Alk phos: OK.  Tbili 3.2 up, no jaundice reported; Triglycerides 189> 243> 321>> 922 today w/ addition of propofol yesterday- repeat Trig 961>changed propofol to precedex,11/29.  Trig 977 11/30 Albumin 1.9 Intake / Output; MIVF:  Output:  NG 100 mls/24 hrs, UOP 1375 mls/24hr, LBM 11/28 11/29 lasix 20 IV x 1 per CCM 11/30 lasix 20 IV x 1 per CCM GI Imaging: 11/15: CT: Mildly dilated loops of small bowel in the left lower quadrant with scattered air-fluid levels, nonspecific for ileus versus early obstruction 11/17: Xray: ileus vs obstruction 11/18: CT:  peritoneal carcinomatosis,  partial bowel obstruction/ileus 11/27 CT: SBO, extensive peritoneal & omental carcinomatosis w/ free ascites (less than prev)  GI Surgeries / Procedures:  11/2 hernia repair 11/16 diagnostic laparoscopy 11/29 EGD: esophagitis, no bleeding; non-bleeding gastric ulcers  Central access: PICC 11/21 TPN start date: 11/21  Nutritional Goals: Goal TPN rate is 85 mL/hr to provide  108 g of protein and 2154 kcals per day)  RD Assessment: Estimated Needs Total Energy Estimated Needs: 2100-2300 Total Protein Estimated Needs: 95-110g Total Fluid Estimated Needs: 2.1L/day  Current Nutrition:  NPO and TPN  Plan:  This morning,  KCl 10 mEq q1h x 2  Continue TPN at goal rate 85 mL/hr at 1800 (Provides 108 gm protein & 1820 kcals)  Remove lipids from TPN due to elevated Trig and increase dextrose to 20%  Electrolytes in TPN: Na 58mq/L, K 60 mEq/L (increased), Ca 529m/L, Mg 4 mEq/L (increase), and Phos 1068m/L. Cl:Ac 1:1  Add standard MVI and trace elements to TPN Continue q4h resistant SSI No IVFs, per MD Monitor TPN labs on Mon/Thurs and PRN, BMET, Mg & Phos in AM    Thank you for allowing pharmacy to be a part of this patient's care.  MelRoyetta AsalharmD, BCPS Clinical Pharmacist [place] Please utilize Amion for appropriate phone number to reach the unit pharmacist ([facility] [place]2/09/2021 7:44 AM

## 2022-07-08 NOTE — Progress Notes (Signed)
ANTICOAGULATION CONSULT NOTE - Follow Up Consult  Pharmacy Consult for Heparin Indication: pulmonary embolus  No Known Allergies  Patient Measurements: Height: '5\' 11"'$  (180.3 cm) Weight: 81.2 kg (179 lb 0.2 oz) IBW/kg (Calculated) : 75.3 Heparin Dosing Weight: 81.2 kg  Vital Signs: Temp: 98.6 F (37 C) (12/02 0630) Temp Source: Esophageal (12/02 0600) BP: 115/75 (12/02 0630) Pulse Rate: 117 (12/02 0630)  Labs: Recent Labs    07/06/22 0002 07/06/22 1705 07/07/22 0348 07/07/22 1400 07/08/22 0505  HGB 7.4*  --  7.3* 7.9* 6.8*  HCT 22.0*  --  22.5* 24.6* 21.5*  PLT 132*  --  135* 147* 120*  APTT  --   --  75*  --  83*  HEPARINUNFRC  --  0.36 0.47  --  0.34  CREATININE 0.78  --  0.98  --  0.89     Estimated Creatinine Clearance: 83.4 mL/min (by C-G formula based on SCr of 0.89 mg/dL).  Assessment:  AC/Heme: heparin drip for new PE on CT (and suspicious for right common iliac DVT ). Dopplers negative for DVT. 11/28 new maroon NGO & Hg 7.3> DC UFH  11/30 Hgb 7.4. Plts 132, resume UFH with no bolus, Hep level 0.36 in goal. 12/1 Heparin level therapeutic at 0.47 with heparin infusing at 1700 units/hr; no bleeding or heparin related issues per nurse 12/2 05:05 Heparin level therapeutic at 0.34 and aPTT therapeutic at 83 with heparin infusing at 1700 units/hr.     -  hgb down 6.8 and 1 unit transfused this am    -  Per nurse, no frank bleeding and infusion paused for approx 6 hours for a bedside paracentesis yesterday and restarted 12/1 1812  Goal of Therapy:  Heparin level 0.3-0.7 units/ml Monitor platelets by anticoagulation protocol: Yes APTT 66-102   Plan:  Continue IV heparin at 1700 units/hr No heparin boluses per MD orders Monitor daily heparin level/aPTT, CBC, signs/symptoms of bleeding Also checking aPTT because TG levels > 360 can interfere with heparin levels    Thank you for allowing pharmacy to be a part of this patient's care.  Royetta Asal,  PharmD, BCPS Clinical Pharmacist [place] Please utilize Amion for appropriate phone number to reach the unit pharmacist ([place]) 07/08/2022 7:20 AM

## 2022-07-09 ENCOUNTER — Inpatient Hospital Stay (HOSPITAL_COMMUNITY): Payer: BC Managed Care – PPO

## 2022-07-09 DIAGNOSIS — C786 Secondary malignant neoplasm of retroperitoneum and peritoneum: Secondary | ICD-10-CM | POA: Diagnosis not present

## 2022-07-09 DIAGNOSIS — K9131 Postprocedural partial intestinal obstruction: Secondary | ICD-10-CM | POA: Diagnosis not present

## 2022-07-09 DIAGNOSIS — K56609 Unspecified intestinal obstruction, unspecified as to partial versus complete obstruction: Secondary | ICD-10-CM | POA: Diagnosis not present

## 2022-07-09 DIAGNOSIS — A419 Sepsis, unspecified organism: Secondary | ICD-10-CM | POA: Diagnosis not present

## 2022-07-09 DIAGNOSIS — Z515 Encounter for palliative care: Secondary | ICD-10-CM | POA: Diagnosis not present

## 2022-07-09 DIAGNOSIS — Z66 Do not resuscitate: Secondary | ICD-10-CM | POA: Diagnosis not present

## 2022-07-09 LAB — CBC
HCT: 31.1 % — ABNORMAL LOW (ref 39.0–52.0)
HCT: 28.6 % — ABNORMAL LOW (ref 39.0–52.0)
Hemoglobin: 10 g/dL — ABNORMAL LOW (ref 13.0–17.0)
Hemoglobin: 9.1 g/dL — ABNORMAL LOW (ref 13.0–17.0)
MCH: 29.7 pg (ref 26.0–34.0)
MCH: 29.5 pg (ref 26.0–34.0)
MCHC: 32.2 g/dL (ref 30.0–36.0)
MCHC: 31.8 g/dL (ref 30.0–36.0)
MCV: 92.3 fL (ref 80.0–100.0)
MCV: 92.9 fL (ref 80.0–100.0)
Platelets: 125 10*3/uL — ABNORMAL LOW (ref 150–400)
Platelets: 121 10*3/uL — ABNORMAL LOW (ref 150–400)
RBC: 3.37 MIL/uL — ABNORMAL LOW (ref 4.22–5.81)
RBC: 3.08 MIL/uL — ABNORMAL LOW (ref 4.22–5.81)
RDW: 19.3 % — ABNORMAL HIGH (ref 11.5–15.5)
RDW: 20.1 % — ABNORMAL HIGH (ref 11.5–15.5)
WBC: 71.5 10*3/uL (ref 4.0–10.5)
WBC: 73 10*3/uL (ref 4.0–10.5)
nRBC: 8.9 % — ABNORMAL HIGH (ref 0.0–0.2)
nRBC: 8.8 % — ABNORMAL HIGH (ref 0.0–0.2)

## 2022-07-09 LAB — GLUCOSE, CAPILLARY
Glucose-Capillary: 128 mg/dL — ABNORMAL HIGH (ref 70–99)
Glucose-Capillary: 137 mg/dL — ABNORMAL HIGH (ref 70–99)
Glucose-Capillary: 141 mg/dL — ABNORMAL HIGH (ref 70–99)
Glucose-Capillary: 141 mg/dL — ABNORMAL HIGH (ref 70–99)
Glucose-Capillary: 147 mg/dL — ABNORMAL HIGH (ref 70–99)
Glucose-Capillary: 172 mg/dL — ABNORMAL HIGH (ref 70–99)

## 2022-07-09 LAB — COMPREHENSIVE METABOLIC PANEL
ALT: 16 U/L (ref 0–44)
AST: 37 U/L (ref 15–41)
Albumin: 2.5 g/dL — ABNORMAL LOW (ref 3.5–5.0)
Alkaline Phosphatase: 92 U/L (ref 38–126)
Anion gap: 5 (ref 5–15)
BUN: 48 mg/dL — ABNORMAL HIGH (ref 8–23)
CO2: 22 mmol/L (ref 22–32)
Calcium: 8.2 mg/dL — ABNORMAL LOW (ref 8.9–10.3)
Chloride: 114 mmol/L — ABNORMAL HIGH (ref 98–111)
Creatinine, Ser: 0.92 mg/dL (ref 0.61–1.24)
GFR, Estimated: >60 mL/min (ref >60)
Glucose, Bld: 154 mg/dL — ABNORMAL HIGH (ref 70–99)
Potassium: 5.3 mmol/L — ABNORMAL HIGH (ref 3.5–5.1)
Sodium: 141 mmol/L (ref 135–145)
Total Bilirubin: 3.2 mg/dL — ABNORMAL HIGH (ref 0.3–1.2)
Total Protein: 6 g/dL — ABNORMAL LOW (ref 6.5–8.1)

## 2022-07-09 LAB — HEPARIN LEVEL (UNFRACTIONATED): Heparin Unfractionated: 0.56 IU/mL (ref 0.30–0.70)

## 2022-07-09 LAB — LACTIC ACID, PLASMA: Lactic Acid, Venous: 1.9 mmol/L (ref 0.5–1.9)

## 2022-07-09 LAB — PHOSPHORUS: Phosphorus: 2.8 mg/dL (ref 2.5–4.6)

## 2022-07-09 LAB — PROCALCITONIN: Procalcitonin: 2.22 ng/mL

## 2022-07-09 LAB — MAGNESIUM: Magnesium: 2.2 mg/dL (ref 1.7–2.4)

## 2022-07-09 LAB — APTT: aPTT: 98 seconds — ABNORMAL HIGH (ref 24–36)

## 2022-07-09 MED ORDER — ACETAMINOPHEN 650 MG RE SUPP
650.0000 mg | Freq: Three times a day (TID) | RECTAL | Status: DC | PRN
  Administered 2022-07-09: 650 mg via RECTAL
  Filled 2022-07-09: qty 1

## 2022-07-09 MED ORDER — ALBUMIN HUMAN 25 % IV SOLN
25.0000 g | Freq: Four times a day (QID) | INTRAVENOUS | Status: AC
  Administered 2022-07-09 – 2022-07-10 (×4): 25 g via INTRAVENOUS
  Filled 2022-07-09 (×4): qty 100

## 2022-07-09 MED ORDER — ACETAMINOPHEN 10 MG/ML IV SOLN
1000.0000 mg | Freq: Four times a day (QID) | INTRAVENOUS | Status: AC
  Administered 2022-07-09 – 2022-07-10 (×4): 1000 mg via INTRAVENOUS
  Filled 2022-07-09 (×4): qty 100

## 2022-07-09 MED ORDER — TRAVASOL 10 % IV SOLN
INTRAVENOUS | Status: DC
  Filled 2022-07-09: qty 1081.2

## 2022-07-09 NOTE — Progress Notes (Addendum)
[place] Progress Note Patient Name: Cameron Rice DOB: 13-Oct-1950 MRN: [id number]   Date of Service  07/09/2022  HPI/Events of Note  OGT was found curled in his mouth. Was removed. Can we get an new order to replace? And xray to verify placement.   eICU Interventions  Ordered both     Intervention Category Minor Interventions: Other:  Elmer Sow 07/09/2022, 8:25 PM  20;55 He vomited a large amount of black sludge immediately after removal. Drainage from NGT was greenish yesterday. Looks a lot darker. Check H&H?  Xray complete. Can you confirm placement? - NG is in place. Ok to place back on LIWS.   S/p SBO- carcinomatosis. DNR.   Get a CBC stat.   5:09 CBC: Hg 8.5, ok.  K at 6.3, Cr 1.3 Glucose 151. IJ and PICC line same side  Discussed with RN. DNR. Urine out put: plenty.  - repeat potassium once. Right extremity: can not get blood due to art line issues.call if > 5.5  6 AM Repeat potassium at 6.2 Hyperkalmeia protocol initiated. Follow potassium post treatment. Get EKG. Not ordering lokelma, due to SBO .

## 2022-07-09 NOTE — Progress Notes (Signed)
ANTICOAGULATION CONSULT NOTE - Follow Up Consult  Pharmacy Consult for Heparin Indication: pulmonary embolus  No Known Allergies  Patient Measurements: Height: '5\' 11"'$  (180.3 cm) Weight: 81.2 kg (179 lb 0.2 oz) IBW/kg (Calculated) : 75.3 Heparin Dosing Weight: 81.2 kg  Vital Signs: Temp: 101.5 F (38.6 C) (12/03 0035) Temp Source: Esophageal (12/03 0000) BP: 130/95 (12/03 0000) Pulse Rate: 140 (12/03 0000)  Labs: Recent Labs    07/07/22 0348 07/07/22 1400 07/08/22 0505 07/08/22 1655 07/09/22 0211 07/09/22 0248  HGB 7.3* 7.9* 6.8* 9.8* 10.0*  --   HCT 22.5* 24.6* 21.5* 30.7* 31.1*  --   PLT 135* 147* 120*  --  125*  --   APTT 75*  --  83*  --   --  98*  HEPARINUNFRC 0.47  --  0.34  --  0.56  --   CREATININE 0.98  --  0.89  --  0.92  --      Estimated Creatinine Clearance: 80.7 mL/min (by C-G formula based on SCr of 0.92 mg/dL).  Assessment:  AC/Heme: heparin drip for new PE on CT (and suspicious for right common iliac DVT ). Dopplers negative for DVT. 11/28 new maroon NGO & Hg 7.3> DC UFH  11/30 Hgb 7.4. Plts 132, resume UFH with no bolus, Hep level 0.36 in goal. 12/1 Heparin level therapeutic at 0.47 with heparin infusing at 1700 units/hr; no bleeding or heparin related issues per nurse 12/2 05:05 Heparin level therapeutic at 0.34 and aPTT therapeutic at 83 with heparin infusing at 1700 units/hr.     -  hgb down 6.8 and 1 unit transfused this am    -  Per nurse, no frank bleeding and infusion paused for approx 6 hours for a bedside paracentesis yesterday and restarted 12/1 1812   07/09/2022 HL 0.56 therapeutic on 1700 units/hr Hgb up to 10, plts 125 stable Per RN no bleeding noted  Goal of Therapy:  Heparin level 0.3-0.7 units/ml Monitor platelets by anticoagulation protocol: Yes APTT 66-102   Plan:  Continue IV heparin at 1700 units/hr No heparin boluses per MD orders Monitor daily heparin level/aPTT, CBC, signs/symptoms of bleeding Also checking aPTT  because TG levels > 360 can interfere with heparin levels    Thank you for allowing pharmacy to be a part of this patient's care.  Dolly Rias RPh 07/09/2022, 5:00 AM

## 2022-07-09 NOTE — Progress Notes (Signed)
NAME:  Cameron Rice, MRN:  [id number], DOB:  [birth year], LOS: 94 ADMISSION DATE:  06/20/2022, CONSULTATION DATE:  07/03/2022 REFERRING MD:  Gershon Cull NP CHIEF COMPLAINT:  Acute resp failure with shock  History of Present Illness:  71 y/o male with PMHx significant for HTN, hyperlipidemia, recurrent UTI and umbilical hernia repair (Nov 2) who presented to [facility] 11/15 with increasing abdominal distention and malaise. Patient reported 2 days of n/v as well as abdominal pain/distention since his repair. He also had not had a bowel movement since the operation. He originally was seen at [facility] where CT A/P revealed possible metastatic disease as well as SBO. He was admitted with general surgery consultation for SBO and suspected intraperitoneal carcinomatosis. He denied any blood in his vomit or stool. No fever/chills.   During this hospitalization he has been since gone back to the OR for diagnostic lap which did reveal peritoneal carcinomatosis (Oncology consulted). He has had an intraoperative paracentesis as well as a subsequent one (11/24). On 11/27, patient again appeared to have large volume ascites. Over the course of his hospitalization he has been treated for SBO with NG decompression. He was also found to have a PE without associated DVT and was started on heparin gtt 11/18. Per surgical notes, he was having some improvement with passing of gas and small BM. His NG was discontinued morning of 11/26 after having tolerated clamping trial with clears. Unfortunately 11/26 PM he required transfer to ICU after being noted to be tachycardic and hypotensive. Repeat CTA Chest 11/26 revealed no additional PE but aspiration as well as large volume ascites and a fluid filled esophagus. Upon exam by TRH, abdomen was quite distended and tympanic. NG was attempted to be placed on floor with subsequent vomiting and obvious aspiration.   Upon arrival to ICU patient remained tachycardic (140s) was given small  volume fluid resuscitation but remained hypotensive with SBP 70s, tachypneic and requiring HHFNC 100% and 40L. He states he is feeling some relief with the NG placement that just occurred and already as ~269m output of dark contents. CCM was consulted for hypotension and concern for need for airway. He is able to talk at this time is oriented and alert. He does have some conversational dyspnea. He states he would want intubation if absolutely necessary. He is agreeable to pressors and would like to remain a full code.   Pertinent Medical History:  HTN Hyperlipidemia Umbilical hernia s/p repair 06/08/22 Peritoneal carcinomatosis unknown primary  Significant Hospital Events: Including procedures, antibiotic start and stop dates in addition to other pertinent events   [facility] Ex lap with frozen sections + for adenocarcinoma, awaiting final path 11/18 Dx with PE started on heparin 11/19 Negative LE dopplers 11/26 Aspiration event  11/27 Transferred to ICU, NG replaced, ongoing tachycardia/hypotension. Delirium in PM prompting CT Head ([facility], CT A/P with SBO, multiple areas of bowel thickening/peritoneal implants, increasing BLL consolidation, anasarca.  11/28 Downtrending WBC/H&H, suspect component of dilution. Hgb 7.3, 1U PRBCs given. CCS/Oncology reengaged. [facility] transfer center contacted. CT Head negative. CT abd/pelvis with SBO, multiple areas of bowel thickening/peritoneal implants, increasing BLL lung consolidation, anasarca. Confusion / delirium late evening.  12/1 paracentesis performed, 3.2L removed   Interim History / Subjective:   He has developed lower blood pressure this morning and started back on levophed Has spiked fever overnight Family updated at bedside today, will obtain palliative care consult   Objective:  Blood pressure 112/75, pulse (!) 138,  temperature (!) 100.6 F (38.1 C), resp. rate (!) 31, height '5\' 11"'$  (1.803 m), weight 81.2 kg, SpO2  98 %. CVP:  [3 mmHg-18 mmHg] 18 mmHg  Vent Mode: PRVC FiO2 (%):  [30 %] 30 % Set Rate:  [18 bmp] 18 bmp Vt Set:  [600 mL] 600 mL PEEP:  [5 cmH20] 5 cmH20 Plateau Pressure:  [24 cmH20-28 cmH20] 25 cmH20   Intake/Output Summary (Last 24 hours) at 07/09/2022 0736 Last data filed at 07/09/2022 3299 Gross per 24 hour  Intake 4566.05 ml  Output 1075 ml  Net 3491.05 ml   Filed Weights   07/04/22 0500 06/11/2022 0351 07/06/22 0515  Weight: 76.4 kg 79.9 kg 81.2 kg   Physical Exam: General: critically ill appearing adult male on vent  HEENT: ETT/NG in place  Neuro: Awakens, not following commands CV: Tachycardic, no mRG  PULM: coarse breath sounds, mild accessory muscles  GI: tensely distended, absent bowel sounds Extremities: warm/dry, trace to 1+ edema.  Skin: no rashes or lesions.   Resolved Hospital Problem List:   Lactic Acidosis Acute Renal Failure secondary to ATN, prerenal AKI  Assessment & Plan:   Septic Shock secondary to Aspiration, with E.Coli PNA  Continue Febrile state and uptrending WBC in setting of stress dose steroids, sepsis along with precedex. Continues to spike fevers Relative Adrenal Insufficiency - stopped stress dose steroids 12/1 Plan -Cardiac Monitoring. MAP goal >65. Remains off vasopressors  -continue meropenem, micafungin -Follow up cultures - MRSA screen negative from 12/1  Acute Hypoxic Respiratory Failure Acute PE  Aspiration Pneumonia Echo 11/27 with LVEF 60-65%, mildly D-shaped septum c/f RV pressure/volume overload. Plan -PRVC with LTVV  -wean PEEP / FiO2 for sats >90% -abx as above  -Continue heparin without bolus -Titrate fentanyl and PRN versed for RASS goal 0/-1  - difficult in weaning ventilator due to generalized weakness/deconditioning and rapidly recurring ascities impacting respiratory status  Peritoneal Carcinomatosis Ascites, malignant in nature Bacterial peritonitis SBO -Path/cyto with moderately differentiated  adenocarcinoma. Surgery/Oncology following.  -CT A/P 11/27 demonstrating SBO, multiple areas of bowel thickening/peritoneal implants, increasing BLL consolidation, anasarca. EGD on 11/29 with areas of ulceration.  -Gastric mucosal biopsies obtained on EGD 06/15/2022 are unrevealing for primary source of peritoneal carcinomatosis  Plan -NPO  -continue TPN  -NGT to LWS -PRN paracentesis. 3.2L removed 12/1. Plan for repeat paracentesis today -appreciate CCS, ONC  Hypertriglyceridemia  -On TPN, propofol  -sedation changed 11/29 Plan -follow intermittent triglycerides -prior hx or oral agents at home  -pharmacy to adjust TPN   Anemia, multifactorial Hgb drift 11/27-11/28, CT Head/CT A/P without e/o active bleeding. NGT output darker. Plan -trend H/H  -transfuse for Hgb <7% or active bleeding, none currently  -BID PPI  -follow NGT output   GOC Ongoing GOC discussion necessary in the setting of poor prognosis with widespread peritoneal carcinomatosis (moderately differentiated adeno with unknown primary). Per UpToDate, 1 year survival is 17% in these cases. -Family is ok with palliative care consult today for further planning - Plan to let family and visitors in as he is nearing end of life   Best Practice: (right click and "Reselect all SmartList Selections" daily)  Diet/type: TPN DVT prophylaxis: SCD, held systemic heparin 11/28 AM given Hgb drift GI prophylaxis: PPI Lines: Central line Foley:  Yes, and it is still needed Code Status:  full code Last date of multidisciplinary goals of care discussion: 11/27AM with patient.  Family updated at bedside 12/1   Critical care time:  45 minutes  Freda Jackson, MD [facility] Office: [phone]   See Amion for personal pager PCCM on call pager [phone] until 7pm. Please call [place] 7p-7a. [phone]

## 2022-07-09 NOTE — IPAL (Signed)
  Interdisciplinary Goals of Care Family Meeting   Date carried out: 07/09/2022  Location of the meeting:  waiting room  Member's involved: Physician and Family Member or next of kin  Durable Power of Attorney or acting medical decision maker: Angela Rice    Discussion: We discussed goals of care for Cameron Rice .  We discussed his code status regarding moving towards comfort care. The family has agreed to DNR/DNI but wish to continue current aggressive measures of care until they decide to transition to comfort.   Code status: Full DNR  Disposition: Continue current acute care  Time spent for the meeting: 15 minutes    Freddi Starr, MD  07/09/2022, 3:21 PM

## 2022-07-09 NOTE — Progress Notes (Addendum)
ID Brief Note   Ongoing fevers.  WBC went up to 71.5 Started back on levophed  12/1 MRSA PCR negative 12/1 blood cx NG in 2 days  12/1 peritoneal aspirate cx NG in 2 days   12/2 resp cx yeast - likely candida, colonizer, no indication to treat   PICC + since 11/21 for TPN Left IJ CVC since 11/28 on pressors   S/p paracentesis today   Surgery recommended palliative consult given poor prognosis  Palliative consulted - made DNR today   Recommendations  Continue current antimicrobial and antifungal coverage   Considered MRSA coverage, however his all his blood cx are negative, he is not colonized with MRSA. I don't think it will make any meaningful difference   Fu surgery and palliative recs Secure chatted Dr Roderic Palau  Dr Baxter Flattery will resume care from Monday   Rosiland Oz, MD Infectious Disease Physician [facility] [address] [place], [place] [address] Phone: [phone]  Fax: [phone]

## 2022-07-09 NOTE — Progress Notes (Signed)
OT Cancellation Note  Patient Details Name: Cameron Rice MRN: [id number] DOB: 11-Nov-1950   Cancelled Treatment:    Reason Eval/Treat Not Completed: Patient not medically ready. Patient continues to be intubated and sedated. OT will sign off for now. Reorder when patient medically ready for therapy.  Aowyn Rozeboom L [place] 07/09/2022, 3:20 PM

## 2022-07-09 NOTE — Progress Notes (Signed)
Intra-abdominal pressure 14.

## 2022-07-09 NOTE — Procedures (Signed)
Paracentesis Procedure Note  Cameron Rice  [id number]  Dec 24, 1950  Date:07/09/22  Time:1:54 PM   Provider Performing:Takara Sermons B Ryland [place]    Procedure: Paracentesis with imaging guidance ([id number])  Indication(s) Ascites  Consent Risks of the procedure as well as the alternatives and risks of each were explained to the patient and/or caregiver.  Consent for the procedure was obtained and is signed in the bedside chart  Anesthesia Topical only with 1% lidocaine    Time Out Verified patient identification, verified procedure, site/side was marked, verified correct patient position, special equipment/implants available, medications/allergies/relevant history reviewed, required imaging and test results available.   Sterile Technique Maximal sterile technique including full sterile barrier drape, hand hygiene, sterile gown, sterile gloves, mask, hair covering, sterile ultrasound probe cover (if used).   Procedure Description Ultrasound used to identify appropriate peritoneal anatomy for placement and overlying skin marked.  Area of drainage cleaned and draped in sterile fashion. Lidocaine was used to anesthetize the skin and subcutaneous tissue.  3000 cc's of amber appearing fluid was drained. Catheter then removed and bandaid applied to site.   Complications/Tolerance None; patient tolerated the procedure well.   EBL Minimal   Specimen(s) None

## 2022-07-09 NOTE — Progress Notes (Signed)
4 Days Post-Op   Subjective/Chief Complaint: Intubated and sedated.    Objective: Vital signs in last 24 hours: Temp:  [98.8 F (37.1 C)-101.5 F (38.6 C)] 100.6 F (38.1 C) (12/03 0600) Pulse Rate:  [123-144] 136 (12/03 0827) Resp:  [19-38] 27 (12/03 0827) BP: (95-133)/(67-95) 112/75 (12/03 0600) SpO2:  [96 %-100 %] 100 % (12/03 0827) FiO2 (%):  [30 %] 30 % (12/03 0827) Last BM Date : 07/04/22  Intake/Output from previous day: 12/02 0701 - 12/03 0700 In: 3541.6 [I.V.:2598.4; Blood:292; IV Piggyback:601.2] Out: 1075 [Urine:825; Emesis/NG output:250] Intake/Output this shift: Total I/O In: 1113.2 [I.V.:973.1; IV Piggyback:140.1] Out: -   General appearance: intubated. Appears ill Resp: rhonchi bilaterally and on vent Cardio: tachy GI: distended  Lab Results:  Recent Labs    07/08/22 0505 07/08/22 1655 07/09/22 0211  WBC 62.6*  --  71.5*  HGB 6.8* 9.8* 10.0*  HCT 21.5* 30.7* 31.1*  PLT 120*  --  125*   BMET Recent Labs    07/08/22 0505 07/09/22 0211  NA 142 141  K 3.8 5.3*  CL 112* 114*  CO2 23 22  GLUCOSE 111* 154*  BUN 46* 48*  CREATININE 0.89 0.92  CALCIUM 8.0* 8.2*   PT/INR No results for input(s): "LABPROT", "INR" in the last 72 hours. ABG Recent Labs    07/06/22 1832  PHART 7.49*  HCO3 25.1    Studies/Results: DG Abd Portable 1V  Result Date: 07/07/2022 CLINICAL DATA:  Encounter for nasogastric tube placement. EXAM: PORTABLE ABDOMEN - 1 VIEW COMPARISON:  Abdominal radiograph 07/07/2022 at 4:51 a.m.; CT abdomen and pelvis FINDINGS: Interval repositioning of enteric tube with the prior flexion at the side port no longer seen. The enteric tube extends approximately 10 cm into the stomach and maintains a straight course without the prior kink. Mild oral contrast is again seen within the transverse and descending colon. Note is again made of fluid distended small bowel loops on prior CT, which would not be well visualized on radiograph. Hazy  appearance of the abdomen again correlating with ascites seen on prior CT. Left basilar heterogeneous airspace lung opacity is grossly similar to prior. Mild calcification within aortic arch. No acute skeletal abnormality. IMPRESSION: Interval repositioning of enteric tube with the prior flexion at the side port no longer visualized. The enteric tube extends approximately 10 cm into the stomach and maintains a straight course without the prior kink. Electronically Signed   By: Yvonne Kendall M.D.   On: 07/07/2022 11:06    Anti-infectives: Anti-infectives (From admission, onward)    Start     Dose/Rate Route Frequency Ordered Stop   07/08/22 1400  meropenem (MERREM) 2 g in sodium chloride 0.9 % 100 mL IVPB        2 g 280 mL/hr over 30 Minutes Intravenous Every 8 hours 07/08/22 1302     07/04/22 2200  vancomycin (VANCOREADY) IVPB 750 mg/150 mL  Status:  Discontinued        750 mg 150 mL/hr over 60 Minutes Intravenous Every 12 hours 07/04/22 1458 06/23/2022 1136   07/04/22 1600  micafungin (MYCAMINE) 100 mg in sodium chloride 0.9 % 100 mL IVPB        100 mg 105 mL/hr over 1 Hours Intravenous Every 24 hours 07/04/22 1439     07/04/22 1600  vancomycin (VANCOCIN) IVPB 1000 mg/200 mL premix        1,000 mg 200 mL/hr over 60 Minutes Intravenous  Once 07/04/22 1458 07/04/22 1736   07/04/22  1545  meropenem (MERREM) 1 g in sodium chloride 0.9 % 100 mL IVPB  Status:  Discontinued        1 g 200 mL/hr over 30 Minutes Intravenous Every 8 hours 07/04/22 1458 07/08/22 1302   07/03/22 0300  piperacillin-tazobactam (ZOSYN) IVPB 3.375 g  Status:  Discontinued        3.375 g 12.5 mL/hr over 240 Minutes Intravenous Every 8 hours 07/03/22 0222 07/04/22 1417   06/14/2022 0200  piperacillin-tazobactam (ZOSYN) IVPB 3.375 g  Status:  Discontinued       See Hyperspace for full Linked Orders Report.   3.375 g 12.5 mL/hr over 240 Minutes Intravenous Every 8 hours 06/20/2022 1849 06/27/22 1232   06/07/2022 1900   piperacillin-tazobactam (ZOSYN) IVPB 3.375 g       See Hyperspace for full Linked Orders Report.   3.375 g 100 mL/hr over 30 Minutes Intravenous  Once 06/18/2022 1849 06/20/2022 1940       Assessment/Plan: s/p Procedure(s) with comments: ESOPHAGOGASTRODUODENOSCOPY (EGD) (N/A) - ICU bedside procedure BIOPSY Abdominal carcinomatosis. Unfortunately there does not appear to be any surgical options to fix his problem or prolong his life. Prognosis is very poor.  I feel we should start thinking about palliative care Will discuss with primary team  LOS: 18 days    Autumn Messing III 07/09/2022

## 2022-07-09 NOTE — Progress Notes (Signed)
PHARMACY - TOTAL PARENTERAL NUTRITION CONSULT NOTE   Indication: SBO with Prolonged ileus  Patient Measurements: Height: 5' 11" (180.3 cm) Weight: 81.2 kg (179 lb 0.2 oz) IBW/kg (Calculated) : 75.3 TPN AdjBW (KG): 76.2 Body mass index is 24.97 kg/m. Usual Weight:    Assessment:  Post operative abdominal pain with ascites after umbilical hernia repair with mesh on 06/08/22.   On 11/16 he had Lap biopsy and ascites aspiration.  Now has ileus with SBO.  Glucose / Insulin: No h/o DM noted. A1C 4.8.  11/28 hydrocortisone >> 12/2 CBGs prior to adding stress steroids < 180  CBGs 111-154, past 24 hrs - resistant SSI used: 15 units last 24h Electrolytes: Na 141 (none in TPN), K up 5.3 and Mag 2.2 (goal K>=4 and Mg>=2 with ileus), Phos 2.8 (replace if </= 2.7), CoCa 9.72 Renal: h/o BPH (holding Flomax while on pressors) SCr WNL, BUN 48  Hepatic: AST/ALT/Alk phos: OK.  Tbili 3.2 up, no jaundice reported; Triglycerides 189> 243> 321>> 922 today w/ addition of propofol yesterday- repeat Trig 961>changed propofol to precedex,11/29.  11/30 Trig 977 12/3 albumin 2.5 Intake / Output; MIVF:  Output:  NG 110 mls/24 hrs, UOP 1875 mls/24hr, LBM 11/28 11/29 lasix 20 IV x 1 per CCM 11/30 lasix 20 IV x 1 per CCM 12/1 lasix 40 mg x 1 in am + lasix 60 mg x 1 in pm MIVF: 1/2 NS at 10 ml/hr GI Imaging: 11/15: CT: Mildly dilated loops of small bowel in the left lower quadrant with scattered air-fluid levels, nonspecific for ileus versus early obstruction 11/17: Xray: ileus vs obstruction 11/18: CT:  peritoneal carcinomatosis,  partial bowel obstruction/ileus 11/27 CT: SBO, extensive peritoneal & omental carcinomatosis w/ free ascites (less than prev)  GI Surgeries / Procedures:  11/2 hernia repair 11/16 diagnostic laparoscopy 11/29 EGD: esophagitis, no bleeding; non-bleeding gastric ulcers  Central access: PICC 11/21 TPN start date: 11/21  Nutritional Goals: Goal TPN rate is 85 mL/hr to provide 108 g  of protein and 2154 kcals per day)  RD Assessment: Estimated Needs Total Energy Estimated Needs: 2100-2300 Total Protein Estimated Needs: 95-110g Total Fluid Estimated Needs: 2.1L/day  Current Nutrition:  NPO and TPN  Plan:  Continue TPN at goal rate 85 mL/hr at 1800 (Provides 108 gm protein & 1820 kcals)  Remove lipids from TPN due to elevated Trig and increase dextrose to 20%  Electrolytes in TPN: Na 0mEq/L, K 55 mEq/L (decreased), Ca 5mEq/L, Mg 4 mEq/L, and Phos 10mmol/L. Cl:Ac 1:1  Add standard MVI and trace elements to TPN Continue q4h resistant SSI Continue 1/2 NS at 10 ml/hr, per MD Monitor TPN labs on Mon/Thurs and PRN   Thank you for allowing pharmacy to be a part of this patient's care.   M. James, PharmD, BCPS Clinical Pharmacist [facility] Please utilize Amion for appropriate phone number to reach the unit pharmacist ([facility]) 07/09/2022 7:58 AM     

## 2022-07-10 ENCOUNTER — Encounter (HOSPITAL_COMMUNITY): Payer: Self-pay | Admitting: Family Medicine

## 2022-07-10 ENCOUNTER — Inpatient Hospital Stay (HOSPITAL_COMMUNITY): Payer: BC Managed Care – PPO

## 2022-07-10 DIAGNOSIS — K56609 Unspecified intestinal obstruction, unspecified as to partial versus complete obstruction: Secondary | ICD-10-CM | POA: Diagnosis not present

## 2022-07-10 DIAGNOSIS — K9131 Postprocedural partial intestinal obstruction: Secondary | ICD-10-CM | POA: Diagnosis not present

## 2022-07-10 DIAGNOSIS — A419 Sepsis, unspecified organism: Secondary | ICD-10-CM | POA: Diagnosis not present

## 2022-07-10 DIAGNOSIS — Z515 Encounter for palliative care: Secondary | ICD-10-CM | POA: Diagnosis not present

## 2022-07-10 DIAGNOSIS — Z66 Do not resuscitate: Secondary | ICD-10-CM | POA: Diagnosis not present

## 2022-07-10 LAB — COMPREHENSIVE METABOLIC PANEL
ALT: 87 U/L — ABNORMAL HIGH (ref 0–44)
AST: 93 U/L — ABNORMAL HIGH (ref 15–41)
Albumin: 2.9 g/dL — ABNORMAL LOW (ref 3.5–5.0)
Alkaline Phosphatase: 97 U/L (ref 38–126)
Anion gap: 5 (ref 5–15)
BUN: 64 mg/dL — ABNORMAL HIGH (ref 8–23)
CO2: 20 mmol/L — ABNORMAL LOW (ref 22–32)
Calcium: 8.4 mg/dL — ABNORMAL LOW (ref 8.9–10.3)
Chloride: 115 mmol/L — ABNORMAL HIGH (ref 98–111)
Creatinine, Ser: 1.38 mg/dL — ABNORMAL HIGH (ref 0.61–1.24)
GFR, Estimated: 55 mL/min — ABNORMAL LOW (ref >60)
Glucose, Bld: 151 mg/dL — ABNORMAL HIGH (ref 70–99)
Potassium: 6.3 mmol/L (ref 3.5–5.1)
Sodium: 140 mmol/L (ref 135–145)
Total Bilirubin: 3.2 mg/dL — ABNORMAL HIGH (ref 0.3–1.2)
Total Protein: 6 g/dL — ABNORMAL LOW (ref 6.5–8.1)

## 2022-07-10 LAB — BPAM RBC
Blood Product Expiration Date: 202401052359
ISSUE DATE / TIME: 202312021215
Unit Type and Rh: 9500

## 2022-07-10 LAB — CBC
HCT: 26.8 % — ABNORMAL LOW (ref 39.0–52.0)
Hemoglobin: 8.5 g/dL — ABNORMAL LOW (ref 13.0–17.0)
MCH: 29.4 pg (ref 26.0–34.0)
MCHC: 31.7 g/dL (ref 30.0–36.0)
MCV: 92.7 fL (ref 80.0–100.0)
Platelets: 122 10*3/uL — ABNORMAL LOW (ref 150–400)
RBC: 2.89 MIL/uL — ABNORMAL LOW (ref 4.22–5.81)
RDW: 19.9 % — ABNORMAL HIGH (ref 11.5–15.5)
WBC: 65.2 10*3/uL (ref 4.0–10.5)
nRBC: 6.6 % — ABNORMAL HIGH (ref 0.0–0.2)

## 2022-07-10 LAB — CULTURE, RESPIRATORY W GRAM STAIN

## 2022-07-10 LAB — GLUCOSE, CAPILLARY
Glucose-Capillary: 133 mg/dL — ABNORMAL HIGH (ref 70–99)
Glucose-Capillary: 134 mg/dL — ABNORMAL HIGH (ref 70–99)
Glucose-Capillary: 85 mg/dL (ref 70–99)
Glucose-Capillary: 91 mg/dL (ref 70–99)
Glucose-Capillary: 145 mg/dL — ABNORMAL HIGH (ref 70–99)

## 2022-07-10 LAB — BASIC METABOLIC PANEL
Anion gap: 8 (ref 5–15)
Anion gap: 6 (ref 5–15)
BUN: 70 mg/dL — ABNORMAL HIGH (ref 8–23)
BUN: 69 mg/dL — ABNORMAL HIGH (ref 8–23)
CO2: 20 mmol/L — ABNORMAL LOW (ref 22–32)
CO2: 21 mmol/L — ABNORMAL LOW (ref 22–32)
Calcium: 8.4 mg/dL — ABNORMAL LOW (ref 8.9–10.3)
Calcium: 8.6 mg/dL — ABNORMAL LOW (ref 8.9–10.3)
Chloride: 114 mmol/L — ABNORMAL HIGH (ref 98–111)
Chloride: 111 mmol/L (ref 98–111)
Creatinine, Ser: 1.3 mg/dL — ABNORMAL HIGH (ref 0.61–1.24)
Creatinine, Ser: 1.35 mg/dL — ABNORMAL HIGH (ref 0.61–1.24)
GFR, Estimated: 59 mL/min — ABNORMAL LOW (ref >60)
GFR, Estimated: 57 mL/min — ABNORMAL LOW (ref >60)
Glucose, Bld: 81 mg/dL (ref 70–99)
Glucose, Bld: 146 mg/dL — ABNORMAL HIGH (ref 70–99)
Potassium: 6.4 mmol/L (ref 3.5–5.1)
Potassium: 5.8 mmol/L — ABNORMAL HIGH (ref 3.5–5.1)
Sodium: 142 mmol/L (ref 135–145)
Sodium: 138 mmol/L (ref 135–145)

## 2022-07-10 LAB — MAGNESIUM: Magnesium: 2.4 mg/dL (ref 1.7–2.4)

## 2022-07-10 LAB — TYPE AND SCREEN
ABO/RH(D): O NEG
Antibody Screen: NEGATIVE
Unit division: 0

## 2022-07-10 LAB — TRIGLYCERIDES: Triglycerides: 181 mg/dL — ABNORMAL HIGH (ref <150)

## 2022-07-10 LAB — PROTEIN, PLEURAL OR PERITONEAL FLUID: Total protein, fluid: <3 g/dL

## 2022-07-10 LAB — PHOSPHORUS: Phosphorus: 4.1 mg/dL (ref 2.5–4.6)

## 2022-07-10 LAB — POTASSIUM: Potassium: 6.2 mmol/L — ABNORMAL HIGH (ref 3.5–5.1)

## 2022-07-10 LAB — APTT: aPTT: 135 seconds — ABNORMAL HIGH (ref 24–36)

## 2022-07-10 LAB — HEPARIN LEVEL (UNFRACTIONATED): Heparin Unfractionated: 0.48 IU/mL (ref 0.30–0.70)

## 2022-07-10 MED ORDER — INSULIN ASPART 100 UNIT/ML IV SOLN
10.0000 [IU] | Freq: Once | INTRAVENOUS | Status: AC
  Administered 2022-07-10: 10 [IU] via INTRAVENOUS

## 2022-07-10 MED ORDER — ALBUMIN HUMAN 25 % IV SOLN: 50.0000 g | Freq: Once | INTRAVENOUS | Status: DC

## 2022-07-10 MED ORDER — DEXTROSE 50 % IV SOLN
1.0000 | Freq: Once | INTRAVENOUS | Status: AC
  Administered 2022-07-10: 50 mL via INTRAVENOUS
  Filled 2022-07-10: qty 50

## 2022-07-10 MED ORDER — SODIUM BICARBONATE 8.4 % IV SOLN
INTRAVENOUS | Status: AC
  Filled 2022-07-10: qty 150

## 2022-07-10 MED ORDER — CALCIUM GLUCONATE-NACL 1-0.675 GM/50ML-% IV SOLN
1.0000 g | Freq: Once | INTRAVENOUS | Status: AC
  Administered 2022-07-10: 1000 mg via INTRAVENOUS
  Filled 2022-07-10: qty 50

## 2022-07-10 MED ORDER — SODIUM BICARBONATE 8.4 % IV SOLN
50.0000 meq | Freq: Once | INTRAVENOUS | Status: AC
  Administered 2022-07-10: 50 meq via INTRAVENOUS
  Filled 2022-07-10: qty 50

## 2022-07-10 MED ORDER — TRAVASOL 10 % IV SOLN
INTRAVENOUS | Status: AC
  Filled 2022-07-10: qty 1081.2

## 2022-07-10 MED ORDER — DEXTROSE 10 % IV SOLN: INTRAVENOUS | Status: AC

## 2022-07-10 MED ORDER — ALBUTEROL SULFATE (2.5 MG/3ML) 0.083% IN NEBU
10.0000 mg | INHALATION_SOLUTION | Freq: Once | RESPIRATORY_TRACT | Status: AC
  Administered 2022-07-10: 10 mg via RESPIRATORY_TRACT
  Filled 2022-07-10: qty 12

## 2022-07-10 NOTE — Progress Notes (Signed)
PHARMACY - TOTAL PARENTERAL NUTRITION CONSULT NOTE   Indication: SBO with Prolonged ileus  Patient Measurements: Height: _0  (180.3 cm) Weight: 81.2 kg (179 lb 0.2 oz) IBW/kg (Calculated) : 75.3 TPN AdjBW (KG): 76.2 Body mass index is 24.97 kg/m. Usual Weight:    Assessment:  Post operative abdominal pain with ascites after umbilical hernia repair with mesh on 06/08/22.   On 11/16 he had Lap biopsy and ascites aspiration.  Now has ileus with SBO.  Glucose / Insulin: No h/o DM noted. A1C 4.8.  CBGs / 24hrs:  133-172 - resistant SSI used: 15 units last 24h - hydrocortisone 11/28 >> 12/2 Electrolytes: Na 140 (none in TPN), K up 6.3, and Mag 2.4 (goal K>=4 and Mg>=2 with ileus), Phos up 4.1, CoCa 9.28 - HyperK treatment:  Insulin/Dextrose, CaGluc 1g, NaBicarb 50 mEq IV Renal: SCr increased 0.9 > 1.38, BUN up to 64  - h/o BPH (holding Flomax while on pressors)  Hepatic: AST/ALT increased 93/87, Alk phos WNL, Tbili elevated at 3.2 - Triglycerides increased to max of 977 (11/30).  Propofol changed to precedex on 11/29, Lipids removed from TPN on 11/30. - 12/4 TG improved significantly to 181.  Resume lipids in TPN. Intake / Output; MIVF:  - Output: NG 220 mls/24 hrs, UOP down significantly to 900 mls/24hr, LBM 11/28 - Lasix:  11/29, 11/30, 12/1 x2 doses - MIVF: 1/2 NS at 10 ml/hr, Albumin 25g IV q6h x4 doses GI Imaging: 11/15: CT: Mildly dilated loops of small bowel in the left lower quadrant with scattered air-fluid levels, nonspecific for ileus versus early obstruction 11/17: Xray: ileus vs obstruction 11/18: CT:  peritoneal carcinomatosis,  partial bowel obstruction/ileus 11/27 CT: SBO, extensive peritoneal & omental carcinomatosis w/ free ascites (less than prev) GI Surgeries / Procedures:  11/2 hernia repair 11/16 diagnostic laparoscopy 11/29 EGD: esophagitis, no bleeding; non-bleeding gastric ulcers  Central access: PICC 11/21 TPN start date: 11/21  Nutritional  Goals: Goal TPN rate is 85 mL/hr to provide 108 g of protein and 2154 kcals per day  RD Assessment: Estimated Needs Total Energy Estimated Needs: 2100-2300 Total Protein Estimated Needs: 95-110g Total Fluid Estimated Needs: 2.1L/day  Current Nutrition:  NPO and TPN  Plan:  Stop TPN infusion on 12/4 at 09:30 AM due to hyperkalemia - may be related to labs being drawn from central line that terminates where TPN is infusing, but err on the side of caution. - start D10 infusion at 85 ml/hr - recheck labs later this morning  At 18:00:  Restart TPN at goal rate 85 mL/hr - Resume lipids in TPN with improved Trig on 12/4 Electrolytes in TPN: Na 32mq/L, K 08m/L, Ca 0 mEq/L, Mg 0 mEq/L, and Phos 61m37m/L. Cl:Ac max Ac Add standard MVI and trace elements to TPN Continue q4h resistant SSI Continue mIVF, per MD Monitor TPN labs on Mon/Thurs and PRN   Thank you for allowing pharmacy to be a part of this patient's care.  ChrGretta ArabarmD, BCPS [facility] [phone]/11/2021 8:00 AM

## 2022-07-10 NOTE — Progress Notes (Signed)
5 Days Post-Op   Subjective/Chief Complaint: Intubated, responsive   Objective: Vital signs in last 24 hours: Temp:  [98.6 F (37 C)-100.2 F (37.9 C)] 99.5 F (37.5 C) (12/04 0945) Pulse Rate:  [118-136] 129 (12/04 0945) Resp:  [24-35] 31 (12/04 0945) BP: (93-141)/(57-78) 99/69 (12/04 0945) SpO2:  [97 %-100 %] 98 % (12/04 0945) FiO2 (%):  [30 %] 30 % (12/04 0815) Last BM Date : 07/04/22  Intake/Output from previous day: 12/03 0701 - 12/04 0700 In: 2841.6 [I.V.:2233.1; IV Piggyback:608.5] Out: 1120 [Urine:900; Emesis/NG output:220] Intake/Output this shift: Total I/O In: 2036.8 [I.V.:1756.8; IV Piggyback:280] Out: 100 [Urine:100]  General appearance: intubated. Appears ill Resp: rhonchi bilaterally and on vent Cardio: tachy GI: distended  Lab Results:  Recent Labs    07/09/22 2105 07/10/22 0405  WBC 73.0* 65.2*  HGB 9.1* 8.5*  HCT 28.6* 26.8*  PLT 121* 122*    BMET Recent Labs    07/09/22 0211 07/10/22 0405 07/10/22 0523  NA 141 140  --   K 5.3* 6.3* 6.2*  CL 114* 115*  --   CO2 22 20*  --   GLUCOSE 154* 151*  --   BUN 48* 64*  --   CREATININE 0.92 1.38*  --   CALCIUM 8.2* 8.4*  --     PT/INR No results for input(s): "LABPROT", "INR" in the last 72 hours. ABG No results for input(s): "PHART", "HCO3" in the last 72 hours.  Invalid input(s): "PCO2", "PO2"   Studies/Results: DG Abd 1 View  Result Date: 07/09/2022 CLINICAL DATA:  NG placement. EXAM: ABDOMEN - 1 VIEW COMPARISON:  Abdominal radiograph dated 07/07/2022. FINDINGS: Enteric tube extends below the diaphragm and makes a loop in the proximal stomach with tip in the region of the body of the stomach. Partially visualized additional support apparatus in similar position. Small left pleural effusion and left lung base atelectasis or infiltrate. IMPRESSION: Enteric tube with tip in the body of the stomach. Electronically Signed   By: Anner Crete M.D.   On: 07/09/2022 21:40     Anti-infectives: Anti-infectives (From admission, onward)    Start     Dose/Rate Route Frequency Ordered Stop   07/08/22 1400  meropenem (MERREM) 2 g in sodium chloride 0.9 % 100 mL IVPB        2 g 280 mL/hr over 30 Minutes Intravenous Every 8 hours 07/08/22 1302     07/04/22 2200  vancomycin (VANCOREADY) IVPB 750 mg/150 mL  Status:  Discontinued        750 mg 150 mL/hr over 60 Minutes Intravenous Every 12 hours 07/04/22 1458 06/27/2022 1136   07/04/22 1600  micafungin (MYCAMINE) 100 mg in sodium chloride 0.9 % 100 mL IVPB        100 mg 105 mL/hr over 1 Hours Intravenous Every 24 hours 07/04/22 1439     07/04/22 1600  vancomycin (VANCOCIN) IVPB 1000 mg/200 mL premix        1,000 mg 200 mL/hr over 60 Minutes Intravenous  Once 07/04/22 1458 07/04/22 1736   07/04/22 1545  meropenem (MERREM) 1 g in sodium chloride 0.9 % 100 mL IVPB  Status:  Discontinued        1 g 200 mL/hr over 30 Minutes Intravenous Every 8 hours 07/04/22 1458 07/08/22 1302   07/03/22 0300  piperacillin-tazobactam (ZOSYN) IVPB 3.375 g  Status:  Discontinued        3.375 g 12.5 mL/hr over 240 Minutes Intravenous Every 8 hours 07/03/22 0222 07/04/22 1417  06/19/2022 0200  piperacillin-tazobactam (ZOSYN) IVPB 3.375 g  Status:  Discontinued       See Hyperspace for full Linked Orders Report.   3.375 g 12.5 mL/hr over 240 Minutes Intravenous Every 8 hours 07/04/2022 1849 06/27/22 1232   06/07/2022 1900  piperacillin-tazobactam (ZOSYN) IVPB 3.375 g       See Hyperspace for full Linked Orders Report.   3.375 g 100 mL/hr over 30 Minutes Intravenous  Once 07/04/2022 1849 06/15/2022 1940       Assessment/Plan: s/p Procedure(s) with comments: ESOPHAGOGASTRODUODENOSCOPY (EGD) (N/A) - ICU bedside procedure BIOPSY Abdominal carcinomatosis. Unfortunately there does not appear to be any surgical options to fix or palliate his problem or prolong his life. Prognosis is very poor. Palliative meeting with patient.  Patient would like to be  seen at [place]. Surgery team will be available as needed.     LOS: 19 days    Cameron Rice 07/10/2022

## 2022-07-10 NOTE — Progress Notes (Addendum)
NAME:  Cameron Rice, MRN:  [id number], DOB:  1950-08-28, LOS: 62 ADMISSION DATE:  07/01/2022, CONSULTATION DATE:  07/03/2022 REFERRING MD:  Gershon Cull NP CHIEF COMPLAINT:  Acute resp failure with shock  History of Present Illness:  71 y/o male with PMHx significant for HTN, hyperlipidemia, recurrent UTI and umbilical hernia repair (Nov 2) who presented to [facility] 11/15 with increasing abdominal distention and malaise. Patient reported 2 days of n/v as well as abdominal pain/distention since his repair. He also had not had a bowel movement since the operation. He originally was seen at [facility] where CT A/P revealed possible metastatic disease as well as SBO. He was admitted with general surgery consultation for SBO and suspected intraperitoneal carcinomatosis. He denied any blood in his vomit or stool. No fever/chills.   During this hospitalization he has been since gone back to the OR for diagnostic lap which did reveal peritoneal carcinomatosis (Oncology consulted). He has had an intraoperative paracentesis as well as a subsequent one (11/24). On 11/27, patient again appeared to have large volume ascites. Over the course of his hospitalization he has been treated for SBO with NG decompression. He was also found to have a PE without associated DVT and was started on heparin gtt 11/18. Per surgical notes, he was having some improvement with passing of gas and small BM. His NG was discontinued morning of 11/26 after having tolerated clamping trial with clears. Unfortunately 11/26 PM he required transfer to ICU after being noted to be tachycardic and hypotensive. Repeat CTA Chest 11/26 revealed no additional PE but aspiration as well as large volume ascites and a fluid filled esophagus. Upon exam by TRH, abdomen was quite distended and tympanic. NG was attempted to be placed on floor with subsequent vomiting and obvious aspiration.   Upon arrival to ICU patient remained tachycardic (140s) was given small  volume fluid resuscitation but remained hypotensive with SBP 70s, tachypneic and requiring HHFNC 100% and 40L. He states he is feeling some relief with the NG placement that just occurred and already as ~260m output of dark contents. CCM was consulted for hypotension and concern for need for airway. He is able to talk at this time is oriented and alert. He does have some conversational dyspnea. He states he would want intubation if absolutely necessary. He is agreeable to pressors and would like to remain a full code.   Pertinent Medical History:  HTN Hyperlipidemia Umbilical hernia s/p repair 06/08/22 Peritoneal carcinomatosis unknown primary  Significant Hospital Events: Including procedures, antibiotic start and stop dates in addition to other pertinent events   [facility] Ex lap with frozen sections + for adenocarcinoma, awaiting final path 11/18 Dx with PE started on heparin 11/19 Negative LE dopplers 11/26 Aspiration event  11/27 Transferred to ICU, NG replaced, ongoing tachycardia/hypotension. Delirium in PM prompting CT Head ([facility], CT A/P with SBO, multiple areas of bowel thickening/peritoneal implants, increasing BLL consolidation, anasarca.  11/28 Downtrending WBC/H&H, suspect component of dilution. Hgb 7.3, 1U PRBCs given. CCS/Oncology reengaged. [facility] transfer center contacted. CT Head negative. CT abd/pelvis with SBO, multiple areas of bowel thickening/peritoneal implants, increasing BLL lung consolidation, anasarca. Confusion / delirium late evening.  12/1 paracentesis performed, 3.2L removed 12/3 Low BP, back on levophed, fever overnight. Changed to DNR. Paracentesis with 3L removed   Interim History / Subjective:  Tmax 99.5 / WBC 65.2  RN notes hyperkalemia on am labs, insulin and dextrose given. Tachy 130's.  Vent - 30%, PEEP  5  I/O 900 ml UOP, 220 ml NGT, 1.7L+ in last 24 hours (17L+ since admit) Off pressors  Objective:  Blood pressure 102/65,  pulse (!) 124, temperature 99.1 F (37.3 C), temperature source Esophageal, resp. rate (!) 24, height '5\' 11"'$  (1.803 m), weight 81.2 kg, SpO2 98 %. CVP:  [8 mmHg-15 mmHg] 12 mmHg  Vent Mode: PRVC FiO2 (%):  [30 %] 30 % Set Rate:  [18 bmp] 18 bmp Vt Set:  [600 mL] 600 mL PEEP:  [5 cmH20] 5 cmH20 Plateau Pressure:  [21 GLO75-64 cmH20] 21 cmH20   Intake/Output Summary (Last 24 hours) at 07/10/2022 0740 Last data filed at 07/09/2022 2200 Gross per 24 hour  Intake 1817.1 ml  Output 350 ml  Net 1467.1 ml   Filed Weights   07/04/22 0500 06/19/2022 0351 07/06/22 0515  Weight: 76.4 kg 79.9 kg 81.2 kg   Exam: General: critically ill appearing adult male lying in bed on vent in NAD  HEENT: MM pink/moist, ETT / NGT, anicteric, improved icterus  Neuro: Awake, alert, mildly HOH, follows commands, nods yes/no to questions CV: s1s2 RRR, ST 130's, no m/r/g PULM: non-labored at rest, lungs bilaterally clear GI: protuberant / tense, absent bowel sounds  Extremities: warm/dry, trace edema, right hand / nail beds dusky despite strong palpable pulses Skin: no rashes or lesions  Resolved Hospital Problem List:  Lactic Acidosis Acute Renal Failure secondary to ATN, prerenal AKI Relative Adrenal Insufficiency  Assessment & Plan:   Septic Shock secondary to Aspiration, with E.Coli PNA  Febrile state and uptrending WBC  In setting of stress dose steroids, sepsis along with malignancy. Continues to spike fevers. Stress dose steroids stopped 12/1.  -cardiac monitoring  -follow BP trend off pressors, MAP goal >65 -follow up new cultures  -continue meropenem -micafungin D7 -follow up cultures   Acute Hypoxic Respiratory Failure Acute PE  Aspiration Pneumonia Echo 11/27 with LVEF 60-65%, mildly D-shaped septum c/f RV pressure/volume overload. -PRVC with LTVV  -wean PEEP / FiO2 for sats >90% -abx as above  -continue heparin infusion per pharmacy  -PAD Protocol with Fentanyl + precedex for RASS Goal  0 to -1  -not tolerating vent wean due to abdominal compliance / deconditioning   Peritoneal Carcinomatosis Ascites, malignant in nature Bacterial peritonitis SBO Path/cyto with moderately differentiated adenocarcinoma. CT A/P 11/27 demonstrating SBO, multiple areas of bowel thickening/peritoneal implants, increasing BLL consolidation, anasarca. EGD on 11/29 with areas of ulceration. Gastric mucosal biopsies obtained on EGD 06/30/2022 are unrevealing for primary source of peritoneal carcinomatosis  -appreciate CCS, ONC -NPO  -hold TPN with hyperkalemia 12/4 -NGT to LWS -no growth on repeat peritoneal culture from 12/1   Hyperkalemia Mild AKI  -hold TPN  -calcium, bicarbonate, insulin, dextrose -repeat labs at 1030  Hypertriglyceridemia  On TPN, propofol. Sedation changed 11/29. On prior agents at home.  -triglycerides resolving  -TPN per pharmacy   Anemia, multifactorial Hgb drift 11/27-11/28, CT Head/CT A/P without e/o active bleeding. NGT output darker. -follow H/H -transfuse for Hgb <7%, or active bleeding  -BID PPI  -follow gastric output   GOC Ongoing GOC discussion necessary in the setting of poor prognosis with widespread peritoneal carcinomatosis (moderately differentiated adeno with unknown primary). Per UpToDate, 1 year survival is 17% in these cases. -Family is ok with palliative care consult today for further planning -Encourage family visitation as he is nearing end of life   Best Practice: (right click and "Reselect all SmartList Selections" daily)  Diet/type: TPN DVT prophylaxis: systemic heparin  GI prophylaxis: PPI Lines: Central line Foley:  Yes, and it is still needed Code Status:  full code Last date of multidisciplinary goals of care discussion: 11/27AM with patient. Family updated on plan of care 12/4.   Critical care time: 75 minutes    Noe Gens, MSN, APRN, NP-C, AGACNP-BC [facility] 07/10/2022, 9:53 AM   Please  see [email or web address] for pager details.   From 7A-7P if no response, please call [phone] After hours, please call ELink [phone]

## 2022-07-10 NOTE — Progress Notes (Addendum)
ANTICOAGULATION CONSULT NOTE - Follow Up Consult  Pharmacy Consult for Heparin Indication: pulmonary embolus  No Known Allergies  Patient Measurements: Height: '5\' 11"'$  (180.3 cm) Weight: 81.2 kg (179 lb 0.2 oz) IBW/kg (Calculated) : 75.3 Heparin Dosing Weight: 81.2 kg  Vital Signs: Temp: 99.1 F (37.3 C) (12/04 0000) Temp Source: Esophageal (12/04 0000) BP: 102/65 (12/04 0000) Pulse Rate: 124 (12/04 0000)  Labs: Recent Labs    07/08/22 0505 07/08/22 1655 07/09/22 0211 07/09/22 0248 07/09/22 2105 07/10/22 0405  HGB 6.8*   < > 10.0*  --  9.1* 8.5*  HCT 21.5*   < > 31.1*  --  28.6* 26.8*  PLT 120*  --  125*  --  121* 122*  APTT 83*  --   --  98*  --   --   HEPARINUNFRC 0.34  --  0.56  --   --  0.48  CREATININE 0.89  --  0.92  --   --   --    < > = values in this interval not displayed.     Estimated Creatinine Clearance: 80.7 mL/min (by C-G formula based on SCr of 0.92 mg/dL).  Assessment:  AC/Heme: heparin drip for new PE on CT (and suspicious for right common iliac DVT ). Dopplers negative for DVT. 11/28 new maroon NGO & Hg 7.3> DC UFH  11/30 Hgb 7.4. Plts 132, resume UFH with no bolus, Hep level 0.36 in goal. 12/1 Heparin level therapeutic at 0.47 with heparin infusing at 1700 units/hr; no bleeding or heparin related issues per nurse 12/2 05:05 Heparin level therapeutic at 0.34 and aPTT therapeutic at 83 with heparin infusing at 1700 units/hr.     -  hgb down 6.8 and 1 unit transfused this am    -  Per nurse, no frank bleeding and infusion paused for approx 6 hours for a bedside paracentesis yesterday and restarted 12/1 1812   07/10/2022 HL 0.48 therapeutic on 1700 units/hr Hgb down 8.5 plts 122 stable Per RN putting out dark brown/black sludge from NG Triglycerides 181  Goal of Therapy:  Heparin level 0.3-0.7 units/ml Monitor platelets by anticoagulation protocol: Yes APTT 66-102   Plan:  Continue IV heparin at 1700 units/hr No heparin boluses per MD  orders Monitor daily heparin level/aPTT, CBC, signs/symptoms of bleeding Also checking aPTT because TG levels > 360 can interfere with heparin levels    Thank you for allowing pharmacy to be a part of this patient's care.  Dolly Rias RPh 07/10/2022, 4:55 AM

## 2022-07-10 NOTE — Progress Notes (Signed)
Nutrition Follow-up  DOCUMENTATION CODES:   Non-severe (moderate) malnutrition in context of acute illness/injury  INTERVENTION:  - TPN bag discontinued today due to hyperkalemia. D10W at 44m/hr ordered. To be discontinued once restarting TPN tonight.  - Per pharmacy, plan to restart goal TPN this evening (adding lipids back in): 108g protein (1.4g/kg), 326g protein (GIR 2.79), 612 lipids calories (28% of calories) which provides 2154 calories (28kcal/kg)   - TPN management per Pharmacy   - Continue to closely monitor triglycerides. Last level today at 181   - Recommend daily weights while on TPN   NUTRITION DIAGNOSIS:   Moderate Malnutrition related to acute illness (SBO following urethral surgery) as evidenced by energy intake < 75% for > 7 days, mild fat depletion, mild muscle depletion. *ongoing  GOAL:   Patient will meet greater than or equal to 90% of their needs *to be met with TPN  MONITOR:   Diet advancement, Labs, Weight trends, I & O's (TPN)  REASON FOR ASSESSMENT:   Consult New TPN/TNA  ASSESSMENT:   71y.o. with history of hypertension-on Lotrel 10-20, HLD on Lipitor 40,recurrent UTI BPH history of urethral surgery who apparently had umbilical hernia repair on November 2 and since then he has been having abdominal distention and not feeling well, and having nausea vomiting x2 days and abdominal distention and pain  11/15 admitted, NPO 11/16: s/p dx lap 11/21: TPN initiated  11/26: NG tube discontinued, diet adv to clear liquids - later in afternoon aspiration event and rapid respond called 11/27 transferred to ICU, NG replaced 11/28 Intubated in afternoon, TPN continued 11/30 Triglycerides at 977, lipids held from TPN 12/4 TPN discontinued due to hyperkalemia, starting D10W, plan to restart goal TPN (lipids added back)  Patient remains intubated and sedated.  Lipids held from TPN 11/30-12/4 due to hypertriglyceridemia. Triglycerides back down to 181  today so plan to add lipids back into TPN this evening.  However, TPN today had to be discontinued due to hyperkalemia. D10W ordered and plan to start new bag of TPN this evening. Most recent weight 11/30. Weights have trended up, although suspect this is likely due to edema.   Medications reviewed and include: Insulin, Miralax, D10W at 550mhr for 7 hours = 119 kcals Fentanyl  Labs reviewed:  *K+ 6.4 Creatinine 1.30 Blood Glucose 85-172 x24 hours   Latest Reference Range & Units 07/03/22 05:00 07/04/22 05:00 07/04/2022 05:23 07/04/2022 08:35 07/06/22 00:02 07/10/22 04:05  Triglycerides <150 mg/dL 243 (H) 321 (H) 922 (H) 961 (H) 977 (H) 181 (H)  (H): Data is abnormally high  Diet Order:   Diet Order             Diet NPO time specified  Diet effective now                   EDUCATION NEEDS:  Education needs have been addressed  Skin:  Skin Assessment: Reviewed RN Assessment  Last BM:  11/28  Height:  Ht Readings from Last 1 Encounters:  07/03/22 _0  (1.803 m)   Weight:  Wt Readings from Last 1 Encounters:  07/06/22 81.2 kg    BMI:  Body mass index is 24.97 kg/m.  Estimated Nutritional Needs:  Kcal:  2100-2300 Protein:  95-110g Fluid:  2.1L/day    AsSamson FredericD, LDN For contact information, refer to [facility]

## 2022-07-11 DIAGNOSIS — R748 Abnormal levels of other serum enzymes: Secondary | ICD-10-CM | POA: Diagnosis not present

## 2022-07-11 DIAGNOSIS — Z515 Encounter for palliative care: Secondary | ICD-10-CM

## 2022-07-11 DIAGNOSIS — R4589 Other symptoms and signs involving emotional state: Secondary | ICD-10-CM

## 2022-07-11 DIAGNOSIS — K56609 Unspecified intestinal obstruction, unspecified as to partial versus complete obstruction: Secondary | ICD-10-CM | POA: Diagnosis not present

## 2022-07-11 DIAGNOSIS — Z7189 Other specified counseling: Secondary | ICD-10-CM

## 2022-07-11 DIAGNOSIS — Z66 Do not resuscitate: Secondary | ICD-10-CM | POA: Diagnosis not present

## 2022-07-11 DIAGNOSIS — A419 Sepsis, unspecified organism: Secondary | ICD-10-CM | POA: Diagnosis not present

## 2022-07-11 DIAGNOSIS — K567 Ileus, unspecified: Secondary | ICD-10-CM | POA: Diagnosis not present

## 2022-07-11 DIAGNOSIS — K9131 Postprocedural partial intestinal obstruction: Secondary | ICD-10-CM | POA: Diagnosis not present

## 2022-07-11 LAB — CBC
HCT: 25.9 % — ABNORMAL LOW (ref 39.0–52.0)
Hemoglobin: 8.2 g/dL — ABNORMAL LOW (ref 13.0–17.0)
MCH: 29.5 pg (ref 26.0–34.0)
MCHC: 31.7 g/dL (ref 30.0–36.0)
MCV: 93.2 fL (ref 80.0–100.0)
Platelets: 113 10*3/uL — ABNORMAL LOW (ref 150–400)
RBC: 2.78 MIL/uL — ABNORMAL LOW (ref 4.22–5.81)
RDW: 21 % — ABNORMAL HIGH (ref 11.5–15.5)
WBC: 55.9 10*3/uL (ref 4.0–10.5)
nRBC: 4.6 % — ABNORMAL HIGH (ref 0.0–0.2)

## 2022-07-11 LAB — GLUCOSE, CAPILLARY
Glucose-Capillary: 114 mg/dL — ABNORMAL HIGH (ref 70–99)
Glucose-Capillary: 118 mg/dL — ABNORMAL HIGH (ref 70–99)
Glucose-Capillary: 121 mg/dL — ABNORMAL HIGH (ref 70–99)
Glucose-Capillary: 123 mg/dL — ABNORMAL HIGH (ref 70–99)
Glucose-Capillary: 130 mg/dL — ABNORMAL HIGH (ref 70–99)
Glucose-Capillary: 130 mg/dL — ABNORMAL HIGH (ref 70–99)
Glucose-Capillary: 142 mg/dL — ABNORMAL HIGH (ref 70–99)
Glucose-Capillary: 98 mg/dL (ref 70–99)

## 2022-07-11 LAB — HEPARIN LEVEL (UNFRACTIONATED): Heparin Unfractionated: 0.47 IU/mL (ref 0.30–0.70)

## 2022-07-11 LAB — BODY FLUID CULTURE W GRAM STAIN
Culture: NO GROWTH
Gram Stain: NONE SEEN

## 2022-07-11 LAB — BASIC METABOLIC PANEL
Anion gap: 7 (ref 5–15)
BUN: 76 mg/dL — ABNORMAL HIGH (ref 8–23)
CO2: 22 mmol/L (ref 22–32)
Calcium: 8.2 mg/dL — ABNORMAL LOW (ref 8.9–10.3)
Chloride: 107 mmol/L (ref 98–111)
Creatinine, Ser: 1.85 mg/dL — ABNORMAL HIGH (ref 0.61–1.24)
GFR, Estimated: 39 mL/min — ABNORMAL LOW (ref >60)
Glucose, Bld: 156 mg/dL — ABNORMAL HIGH (ref 70–99)
Potassium: 5.9 mmol/L — ABNORMAL HIGH (ref 3.5–5.1)
Sodium: 136 mmol/L (ref 135–145)

## 2022-07-11 LAB — PHOSPHORUS: Phosphorus: 3.7 mg/dL (ref 2.5–4.6)

## 2022-07-11 LAB — MAGNESIUM: Magnesium: 2.3 mg/dL (ref 1.7–2.4)

## 2022-07-11 LAB — MISC LABCORP TEST (SEND OUT): Labcorp test code: 832599

## 2022-07-11 LAB — TRIGLYCERIDES: Triglycerides: 295 mg/dL — ABNORMAL HIGH (ref <150)

## 2022-07-11 LAB — TROPONIN I (HIGH SENSITIVITY): Troponin I (High Sensitivity): 30 ng/L — ABNORMAL HIGH (ref <18)

## 2022-07-11 MED ORDER — DEXTROSE 10 % IV SOLN: INTRAVENOUS | Status: AC

## 2022-07-11 MED ORDER — TRAVASOL 10 % IV SOLN
INTRAVENOUS | Status: AC
  Filled 2022-07-11: qty 1081.2

## 2022-07-11 MED ORDER — SODIUM BICARBONATE 8.4 % IV SOLN
INTRAVENOUS | Status: DC
  Filled 2022-07-11: qty 1000
  Filled 2022-07-11: qty 150

## 2022-07-11 MED ORDER — FUROSEMIDE 10 MG/ML IJ SOLN
20.0000 mg | Freq: Two times a day (BID) | INTRAMUSCULAR | Status: AC
  Administered 2022-07-11 (×2): 20 mg via INTRAVENOUS
  Filled 2022-07-11 (×2): qty 2

## 2022-07-11 MED ORDER — ALBUMIN HUMAN 25 % IV SOLN
50.0000 g | Freq: Once | INTRAVENOUS | Status: AC
  Administered 2022-07-11: 50 g via INTRAVENOUS
  Filled 2022-07-11: qty 200

## 2022-07-11 MED ORDER — SODIUM CHLORIDE 0.9 % IV SOLN
1.0000 g | Freq: Two times a day (BID) | INTRAVENOUS | Status: DC
  Administered 2022-07-11 – 2022-07-14 (×6): 1 g via INTRAVENOUS
  Filled 2022-07-11 (×6): qty 20

## 2022-07-11 NOTE — Progress Notes (Signed)
Pharmacy Antibiotic Note  Cameron Rice is a 71 y.o. male admitted on 60/11/5995 with complications from widespread intraperitoneal malignancy. Patient unfortunately aspirated on 11/26 and was treated with Zosyn. Due to continued decline Pharmacy has been consulted to broaden antibiotics to vancomycin and meropenem. Vancomycin stopped 11/29.  Also started on empiric antifungal coverage per CCM.  Today, Tmax 100.9, worsening leukocytosis to 55.9, and SCr significantly increased to 1.85  Plan: Decrease Meropenem to 1g IV q12 hr Mycamine per MD; dosing appropriate  Height: '5\' 11"'$  (180.3 cm) Weight: 90.1 kg (198 lb 10.2 oz) IBW/kg (Calculated) : 75.3  Temp (24hrs), Avg:99.9 F (37.7 C), Min:98.6 F (37 C), Max:100.9 F (38.3 C)  Recent Labs  Lab 07/04/22 0905 07/04/22 1045 07/04/22 1600 07/01/2022 0523 07/07/22 0546 07/07/22 1400 07/08/22 0505 07/09/22 0211 07/09/22 1119 07/09/22 2105 07/10/22 0405 07/10/22 1127 07/10/22 1818 07/11/22 0447  WBC  --   --  26.8*   < >  --    < > 62.6* 71.5*  --  73.0* 65.2*  --   --  55.9*  CREATININE  --   --   --    < >  --   --  0.89 0.92  --   --  1.38* 1.30* 1.35* 1.85*  LATICACIDVEN 2.1* 2.6* 3.7*  --  1.9  --   --   --  1.9  --   --   --   --   --    < > = values in this interval not displayed.     Estimated Creatinine Clearance: 40.1 mL/min (A) (by C-G formula based on SCr of 1.85 mg/dL (H)).    No Known Allergies  Antimicrobials this admission: 11/15 zosyn >> 11/21; restarted 11/27 >>11/28 11/28 vancomycin >> 11/29 11/28 meropenem >> 11/28 micafungin >>   Microbiology results: 12/1 BCx: ngtd 12/1 Trach asp: rare candida lusitaniae 12/1 Peritoneal washing: ngtd 12/1 MRSA PCR: not detected 11/28 tracheal aspirate: abundant E. Coli (ESBL) 11/27 peritoneal fluid: NGF 11/26 BCx: NGF 11/24 peritoneal: NGF 11/16 BCx: NGF 11/16 MRSA PCR: negative    Thank you for allowing pharmacy to be a part of this patient's  care. Gretta Arab PharmD, BCPS [facility] main pharmacy [phone] 07/11/2022 7:38 AM

## 2022-07-11 NOTE — Progress Notes (Signed)
Staff in room for morning care. Turned patient in bed. TPN tubing broke loose from connecting joint. Plastic threaded end still on patient line, but TPN tubing fell on floor. TPN stopped and pharmacy notified

## 2022-07-11 NOTE — Progress Notes (Signed)
NAME:  Cameron Rice, MRN:  [id number], DOB:  [birth year], LOS: 40 ADMISSION DATE:  06/08/2022, CONSULTATION DATE:  07/03/2022 REFERRING MD:  Gershon Cull NP CHIEF COMPLAINT:  Acute resp failure with shock  History of Present Illness:  71 y/o male with PMHx significant for HTN, hyperlipidemia, recurrent UTI and umbilical hernia repair (Nov 2) who presented to [facility] 11/15 with increasing abdominal distention and malaise. Patient reported 2 days of n/v as well as abdominal pain/distention since his repair. He also had not had a bowel movement since the operation. He originally was seen at [facility] where CT A/P revealed possible metastatic disease as well as SBO. He was admitted with general surgery consultation for SBO and suspected intraperitoneal carcinomatosis. He denied any blood in his vomit or stool. No fever/chills.   During this hospitalization he has been since gone back to the OR for diagnostic lap which did reveal peritoneal carcinomatosis (Oncology consulted). He has had an intraoperative paracentesis as well as a subsequent one (11/24). On 11/27, patient again appeared to have large volume ascites. Over the course of his hospitalization he has been treated for SBO with NG decompression. He was also found to have a PE without associated DVT and was started on heparin gtt 11/18. Per surgical notes, he was having some improvement with passing of gas and small BM. His NG was discontinued morning of 11/26 after having tolerated clamping trial with clears. Unfortunately 11/26 PM he required transfer to ICU after being noted to be tachycardic and hypotensive. Repeat CTA Chest 11/26 revealed no additional PE but aspiration as well as large volume ascites and a fluid filled esophagus. Upon exam by TRH, abdomen was quite distended and tympanic. NG was attempted to be placed on floor with subsequent vomiting and obvious aspiration.   Upon arrival to ICU patient remained tachycardic (140s) was given small  volume fluid resuscitation but remained hypotensive with SBP 70s, tachypneic and requiring HHFNC 100% and 40L. He states he is feeling some relief with the NG placement that just occurred and already as ~291m output of dark contents. CCM was consulted for hypotension and concern for need for airway. He is able to talk at this time is oriented and alert. He does have some conversational dyspnea. He states he would want intubation if absolutely necessary. He is agreeable to pressors and would like to remain a full code.   Pertinent Medical History:  HTN Hyperlipidemia Umbilical hernia s/p repair 06/08/22 Peritoneal carcinomatosis unknown primary  Significant Hospital Events: Including procedures, antibiotic start and stop dates in addition to other pertinent events   [facility] Ex lap with frozen sections + for adenocarcinoma, awaiting final path 11/18 Dx with PE started on heparin 11/19 Negative LE dopplers 11/26 Aspiration event  11/27 Transferred to ICU, NG replaced, ongoing tachycardia/hypotension. Delirium in PM prompting CT Head ([facility], CT A/P with SBO, multiple areas of bowel thickening/peritoneal implants, increasing BLL consolidation, anasarca.  11/28 Downtrending WBC/H&H, suspect component of dilution. Hgb 7.3, 1U PRBCs given. CCS/Oncology reengaged. [facility] transfer center contacted. CT Head negative. CT abd/pelvis with SBO, multiple areas of bowel thickening/peritoneal implants, increasing BLL lung consolidation, anasarca. Confusion / delirium late evening.  12/1 paracentesis performed, 3.2L removed 12/3 Low BP, back on levophed, fever overnight. Changed to DNR. Paracentesis with 3L removed  12/4 Off pressors, vent needs 30%/PEEP 5, hyperkalemia, bicarb infusion initiated   Interim History / Subjective:  Tmax 99.9  RN reports worsening renal failure, hyperkalemia  Remains  on bicarbonate infusion I/O UOP 948 ml, NGT 480, +4.9L in last 24 hours RN reports TPN  held over night with issues with tubing  Objective:  Blood pressure 94/70, pulse (!) 130, temperature 99.9 F (37.7 C), resp. rate 14, height '5\' 11"'$  (1.803 m), weight 90.1 kg, SpO2 99 %. CVP:  [8 mmHg-10 mmHg] 9 mmHg  Vent Mode: PRVC FiO2 (%):  [30 %] 30 % Set Rate:  [1 bmp-18 bmp] 18 bmp Vt Set:  [600 mL] 600 mL PEEP:  [5 cmH20] 5 cmH20 Pressure Support:  [10 cmH20] 10 cmH20 Plateau Pressure:  [19 cmH20-25 cmH20] 25 cmH20   Intake/Output Summary (Last 24 hours) at 07/11/2022 0834 Last data filed at 07/11/2022 3419 Gross per 24 hour  Intake 6407.62 ml  Output 1328 ml  Net 5079.62 ml   Filed Weights   06/30/2022 0351 07/06/22 0515 07/11/22 0442  Weight: 79.9 kg 81.2 kg 90.1 kg   Exam: General: critically ill appearing adult male   HEENT: MM pink/moist, ETT, anicteric, pupils =/reactive Neuro: fentanyl infusing, awake/alert, nods yes/no to questions CV: s1s2 RRR, no m/r/g PULM: non-labored at rest, lungs bilaterally clear GI: persistently distended / tight, lap sites c/d/i   Extremities: warm/dry, no significant edema, right hand discoloration  Skin: no rashes or lesions  Resp culture > rare candida lusitaniae  BC > pending  Resolved Hospital Problem List:  Lactic Acidosis Acute Renal Failure secondary to ATN, prerenal AKI Relative Adrenal Insufficiency  Assessment & Plan:   Septic Shock secondary to Aspiration, with E.Coli PNA  Febrile state and uptrending WBC  In setting of stress dose steroids, sepsis along with malignancy. Continues to spike fevers. Stress dose steroids stopped 12/1.  -tele monitoring  -follow off pressors  -continue meropenem  -D8 micafungin  -follow cultures to maturity   Acute Hypoxic Respiratory Failure Acute PE / DVT Aspiration Pneumonia Echo 11/27 with LVEF 60-65%, mildly D-shaped septum c/f RV pressure/volume overload. -PRVC with LTVV  -wean PEEP / FiO2 for sats >90% -abx as above  -continue heparin infusion per pharmacy for PE/DVT   -PAD protocol for RASS Goal 0 to -1 with fentanyl -abdominal compliance impedes vent weaning   Peritoneal Carcinomatosis Ascites, malignant in nature Bacterial peritonitis SBO Path/cyto with moderately differentiated adenocarcinoma. CT A/P 11/27 demonstrating SBO, multiple areas of bowel thickening/peritoneal implants, increasing BLL consolidation, anasarca. EGD on 11/29 with areas of ulceration. Gastric mucosal biopsies obtained on EGD 06/30/2022 are unrevealing for primary source of peritoneal carcinomatosis  -appreciate ONC, CCS -NPO  -resume TPN when bag prepared  -NGT to LWS   Hyperkalemia AKI  -TPN formula changed -unable to give kayexalate/PO meds -continue bicarbonate infusion  -albumin x1 with lasix  -reviewed effects of renal failure with family   Hypertriglyceridemia  On TPN, propofol. Sedation changed 11/29. On prior agents at home.  -TPN per pharmacy  -follow intermittently   Anemia, multifactorial Hgb drift 11/27-11/28, CT Head/CT A/P without e/o active bleeding. NGT output darker. -trend H/H  -BID PPI  -follow NGT output  -transfuse for Hgb <7%  GOC Ongoing GOC discussion necessary in the setting of poor prognosis with widespread peritoneal carcinomatosis (moderately differentiated adeno with unknown primary). Per UpToDate, 1 year survival is 17% in these cases. -Family is ok with palliative care consult today for further planning -Encourage family visitation as he is nearing end of life   Best Practice: (right click and "Reselect all SmartList Selections" daily)  Diet/type: TPN DVT prophylaxis: systemic heparin  GI prophylaxis: PPI Lines: Central  line Foley:  Yes, and it is still needed Code Status:  full code Last date of multidisciplinary goals of care discussion: 11/27AM with patient. Wife and daughter updated on plan of care 12/5   Critical care time: 77 minutes    Noe Gens, MSN, APRN, NP-C, AGACNP-BC [facility] 07/11/2022, 8:34 AM   Please see [email or web address] for pager details.   From 7A-7P if no response, please call [phone] After hours, please call ELink [phone]

## 2022-07-11 NOTE — Progress Notes (Signed)
Pt placed on full support at [phone]

## 2022-07-11 NOTE — Consult Note (Signed)
Consultation Note Date: 07/11/2022   Patient Name: Cameron Rice  DOB: 04/08/1951  MRN: [id number]  Age / Sex: 71 y.o., male   PCP: Nche, Charlene Brooke, NP Referring Physician: Candee Furbish, MD  Reason for Consultation: Establishing goals of care     Chief Complaint/History of Present Illness:   Patient is a 71yo M with a PMHx of HTN, HLD, recurrent UTIs, BPH, and umbilical hernia repair who was admitted on 07/06/2022 for management of abdominal distention. During prolonged hospitalization, patient has been found to have peritoneal carcinomatosis with moderately differentiated adenocarcinoma, SBO, bacterial peritonitis, required TPN, septic shock 2/2 aspiration with E.Coli PNA with acute hypoxic respiratory failure requiring intubation, and AKI.   Extensive review of EMR prior to seeing patient; patient was admitted on 06/21/12 and has undergone multiple interventions during prolonged hospitalization. Spoke with primary PCCM team prior to seeing patient as well. Overnight TPN held due to issues with tubing. Kidney function has continued to worsen despite appropriate medical interventions.   Presented to bedside about with PCCM team members including Noe Gens, NP and Dr. Tamala Julian.  Patient laying intubated on ventilator support though alert and interactive. Patient's family, wife and daughter, present at bedside as well. Dr. Tamala Julian able to update them regarding patient's worsening renal; function. Patient is not a candidate for dialysis. Continuing appropriate supportive measures at this time.   With permission, Dr. Tamala Julian and myself able to speak outside of patient's room to wife and daughter. Dr. Tamala Julian expressed appropriate concern that patient has multiple organs failing and this worsening renal function may be a sign of the terminal event in patient's life. Though providers are continuing with appropriate medical interventions at this time, if patient's status continues to worsen, would need  to consider appropriate transition to comfort care. Wife and daughter expressed hearing this.  After Dr. Tamala Julian discussed this and excused himself, this provider was able to stay behind to follow up with family and provided emotional support as able. Noted providers could be the ones to inform patient regarding medical care moving forward, including worsening status, so that family did not feel this burden. Family voiced appreciation for this and time for them to process things before patient was informed. Acknowledged and respected their wishes. Inquired about family and spiritual support. Bedside RN present going to follow up about visitors as patient has 5 older grandchildren who would want to visit. Encouraged time with family. All questions answered at that time. Noted PMT would continue to follow along to provider support and guidance.   Primary Diagnoses  Present on Admission:  SBO (small bowel obstruction) (HCC)  Essential hypertension  Complicated UTI (urinary tract infection)  BPH (benign prostatic hyperplasia)  Pure hypercholesterolemia  LFT elevation   Palliative Review of Systems: Patient intubated though denied pain.   Past Medical History:  Diagnosis Date   G6PD deficiency    Hyperlipidemia    Hypertension    UTI (urinary tract infection)    Social History   Socioeconomic History   Marital status: Married    Spouse name: Not on file   Number of children: Not on file   Years of education: Not on file   Highest education level: Not on file  Occupational History   Not on file  Tobacco Use   Smoking status: Never   Smokeless tobacco: Never  Vaping Use   Vaping Use: Never used  Substance and Sexual Activity   Alcohol use: No   Drug use: No  Sexual activity: Yes  Other Topics Concern   Not on file  Social History Narrative   Not on file   Social Determinants of Health   Financial Resource Strain: Not on file  Food Insecurity: No Food Insecurity (06/26/2022)    Hunger Vital Sign    Worried About Running Out of Food in the Last Year: Never true    Ran Out of Food in the Last Year: Never true  Transportation Needs: Unknown (06/26/2022)   PRAPARE - Transportation    Lack of Transportation (Medical): Patient refused    Lack of Transportation (Non-Medical): Patient refused  Physical Activity: Not on file  Stress: Not on file  Social Connections: Not on file   Family History  Problem Relation Age of Onset   Cancer Mother    Cancer Brother        liver cancer   Cancer Maternal Aunt        lung   Scheduled Meds:  Chlorhexidine Gluconate Cloth  6 each Topical Daily   insulin aspart  0-20 Units Subcutaneous Q4H   mouth rinse  15 mL Mouth Rinse Q2H   pantoprazole (PROTONIX) IV  40 mg Intravenous Q12H   polyethylene glycol  17 g Per Tube Daily   sodium chloride flush  10-40 mL Intracatheter Q12H   Continuous Infusions:  sodium chloride Stopped (07/06/22 2006)   dextrose 85 mL/hr at 07/11/22 0656   fentaNYL infusion INTRAVENOUS 200 mcg/hr (07/11/22 2440)   heparin 1,700 Units/hr (07/11/22 1027)   meropenem (MERREM) IV     micafungin (MYCAMINE) 100 mg in sodium chloride 0.9 % 100 mL IVPB Stopped (07/10/22 1629)   norepinephrine (LEVOPHED) Adult infusion Stopped (07/08/22 0241)   sodium bicarbonate 150 mEq in dextrose 5 % 1,150 mL infusion 100 mL/hr at 07/11/22 0631   TPN ADULT (ION) Stopped (07/11/22 0603)   PRN Meds:.acetaminophen, fentaNYL, midazolam, mouth rinse, prochlorperazine, sodium chloride flush No Known Allergies CBC:    Component Value Date/Time   WBC 55.9 (HH) 07/11/2022 0447   HGB 8.2 (L) 07/11/2022 0447   HCT 25.9 (L) 07/11/2022 0447   PLT 113 (L) 07/11/2022 0447   MCV 93.2 07/11/2022 0447   NEUTROABS 54.4 (H) 07/07/2022 1400   LYMPHSABS 6.5 (H) 07/07/2022 1400   MONOABS 1.5 (H) 07/07/2022 1400   EOSABS 0.0 07/07/2022 1400   BASOSABS 0.0 07/07/2022 1400   Comprehensive Metabolic Panel:    Component Value Date/Time    NA 136 07/11/2022 0447   K 5.9 (H) 07/11/2022 0447   CL 107 07/11/2022 0447   CO2 22 07/11/2022 0447   BUN 76 (H) 07/11/2022 0447   CREATININE 1.85 (H) 07/11/2022 0447   GLUCOSE 156 (H) 07/11/2022 0447   CALCIUM 8.2 (L) 07/11/2022 0447   AST 93 (H) 07/10/2022 0405   ALT 87 (H) 07/10/2022 0405   ALKPHOS 97 07/10/2022 0405   BILITOT 3.2 (H) 07/10/2022 0405   PROT 6.0 (L) 07/10/2022 0405   ALBUMIN 2.9 (L) 07/10/2022 0405    Physical Exam: Vital Signs: BP 92/66   Pulse (!) 131   Temp 99.9 F (37.7 C)   Resp (!) 21   Ht '5\' 11"'$  (1.803 m)   Wt 90.1 kg   SpO2 98%   BMI 27.70 kg/m  SpO2: SpO2: 98 % O2 Device: O2 Device: Ventilator O2 Flow Rate: O2 Flow Rate (L/min): 40 L/min Intake/output summary:  Intake/Output Summary (Last 24 hours) at 07/11/2022 0746 Last data filed at 07/11/2022 2536 Gross per 24 hour  Intake 6410.2 ml  Output 1428 ml  Net 4982.2 ml   LBM: Last BM Date : 07/04/22 Baseline Weight: Weight: 81.6 kg Most recent weight: Weight: 90.1 kg  General: Awake, intubated, interactive, frail, critically ill appearing HENT: ETT in place Cardiovascular: RRR, no edema in LE b/l Respiratory: intubated on ventilator support Abdomen: distended Extremities: moving UEs spontaneously  Skin: right hand discoloration Neuro: awake, interactive         Palliative Performance Scale: 20%            Additional Data Reviewed: Recent Labs    07/10/22 0405 07/10/22 1127 07/10/22 1818 07/11/22 0447  WBC 65.2*  --   --  55.9*  HGB 8.5*  --   --  8.2*  PLT 122*  --   --  113*  NA 140   < > 138 136  BUN 64*   < > 69* 76*  CREATININE 1.38*   < > 1.35* 1.85*   < > = values in this interval not displayed.    Imaging: CT CHEST WO CONTRAST CLINICAL DATA:  A 71 year old male presents for evaluation of complications related to pneumonia. Also with history of peritoneal carcinomatosis of uncertain etiology.  * Tracking Code: BO *  EXAM: CT CHEST, ABDOMEN AND PELVIS  WITHOUT CONTRAST  TECHNIQUE: Multidetector CT imaging of the chest, abdomen and pelvis was performed following the standard protocol without IV contrast.  RADIATION DOSE REDUCTION: This exam was performed according to the departmental dose-optimization program which includes automated exposure control, adjustment of the mA and/or kV according to patient size and/or use of iterative reconstruction technique.  COMPARISON:  Multiple prior studies have been performed recently most recent imaging from November 26 of the chest with CT and November twenty-seventh of the abdomen and pelvis with CT.  FINDINGS: CT CHEST FINDINGS  Cardiovascular: Calcified aortic atherosclerotic changes are mild.  LEFT-sided IJ central venous catheter terminates in the area of the caval to atrial junction. LEFT-sided PICC line terminating in the low SVC.  No pericardial nodularity. Trace pericardial effusion. Central pulmonary vasculature is of normal caliber. Limited assessment of cardiovascular structures given lack of intravenous contrast.  Mediastinum/Nodes: Endotracheal tube in place terminating in the mid to low trachea. Gastric tube coursing into the abdomen. No signs of adenopathy in the chest.  Lungs/Pleura: Patchy basilar airspace disease with more confluent areas dependently. Seen mainly in the lower lobes and improved since the CT of the abdomen of July 03, 2022 still with signs of airspace disease in filling of lower lobe bronchi with material worse on the RIGHT than the LEFT. Patchy areas of ground-glass are seen elsewhere in the chest and or mild and not substantially changed.  Musculoskeletal: See below for full musculoskeletal details.  CT ABDOMEN PELVIS FINDINGS  Hepatobiliary: Scalloped margin of the liver. Dilated gallbladder up to 5 cm in the axial plane.  Pancreas: No gross pancreatic abnormality.  Spleen: Peritoneal fluid in nodularity about the anterior  spleen.  Adrenals/Urinary Tract: Adrenal glands are normal.  Mild perinephric stranding similar to prior imaging. No hydronephrosis. Gas in the urinary bladder.  Stomach/Bowel: Gastric tube in place, tip in the gastric antrum coiled with redundant loop in the fundus. Small-bowel dilation weakly opacified with ingested contrast media presumably and measuring less than 3 cm maximal caliber, less distended than on previous imaging. Diffuse interloop fluid has increased since imaging from November 27th. Nodularity in the ileocolic mesentery at along margins of the colon along the anti mesenteric border of  the colon is similar grossly.  Vascular/Lymphatic:  Aortic atherosclerosis. No sign of aneurysm. Smooth contour of the IVC. There is no gastrohepatic or hepatoduodenal ligament lymphadenopathy. No retroperitoneal or mesenteric lymphadenopathy.  No pelvic sidewall lymphadenopathy.  Reproductive: Unremarkable.  Other: Moderate LEFT inguinal hernia contains fat. Body wall edema perhaps slightly worse than on previous imaging. Nodularity in the upper abdomen in the peritoneum and omentum.  Musculoskeletal: No acute bone finding. No destructive bone process. Spinal degenerative changes.  IMPRESSION: 1. Signs of aspiration related pneumonia/pneumonitis improved since July 03, 2022. Still with material in lower lobe bronchi and basilar airspace disease. 2. Increased ascites and interloop fluid in this patient with known peritoneal carcinomatosis. 3. Signs of serosal disease of involved bowel loops with diminished dilation of small bowel since previous imaging. No current evidence of gross obstruction. 4. Increasingly distended gallbladder could reflect fasting state though would suggest correlation with any RIGHT upper quadrant symptoms or laboratory abnormalities and with HIDA scan as warranted. 5. Aortic atherosclerosis.  Aortic Atherosclerosis  (ICD10-I70.0).  Electronically Signed   By: Zetta Bills M.D.   On: 07/10/2022 14:05 CT ABDOMEN PELVIS WO CONTRAST CLINICAL DATA:  A 71 year old male presents for evaluation of complications related to pneumonia. Also with history of peritoneal carcinomatosis of uncertain etiology.  * Tracking Code: BO *  EXAM: CT CHEST, ABDOMEN AND PELVIS WITHOUT CONTRAST  TECHNIQUE: Multidetector CT imaging of the chest, abdomen and pelvis was performed following the standard protocol without IV contrast.  RADIATION DOSE REDUCTION: This exam was performed according to the departmental dose-optimization program which includes automated exposure control, adjustment of the mA and/or kV according to patient size and/or use of iterative reconstruction technique.  COMPARISON:  Multiple prior studies have been performed recently most recent imaging from November 26 of the chest with CT and November twenty-seventh of the abdomen and pelvis with CT.  FINDINGS: CT CHEST FINDINGS  Cardiovascular: Calcified aortic atherosclerotic changes are mild.  LEFT-sided IJ central venous catheter terminates in the area of the caval to atrial junction. LEFT-sided PICC line terminating in the low SVC.  No pericardial nodularity. Trace pericardial effusion. Central pulmonary vasculature is of normal caliber. Limited assessment of cardiovascular structures given lack of intravenous contrast.  Mediastinum/Nodes: Endotracheal tube in place terminating in the mid to low trachea. Gastric tube coursing into the abdomen. No signs of adenopathy in the chest.  Lungs/Pleura: Patchy basilar airspace disease with more confluent areas dependently. Seen mainly in the lower lobes and improved since the CT of the abdomen of July 03, 2022 still with signs of airspace disease in filling of lower lobe bronchi with material worse on the RIGHT than the LEFT. Patchy areas of ground-glass are seen elsewhere in the chest and  or mild and not substantially changed.  Musculoskeletal: See below for full musculoskeletal details.  CT ABDOMEN PELVIS FINDINGS  Hepatobiliary: Scalloped margin of the liver. Dilated gallbladder up to 5 cm in the axial plane.  Pancreas: No gross pancreatic abnormality.  Spleen: Peritoneal fluid in nodularity about the anterior spleen.  Adrenals/Urinary Tract: Adrenal glands are normal.  Mild perinephric stranding similar to prior imaging. No hydronephrosis. Gas in the urinary bladder.  Stomach/Bowel: Gastric tube in place, tip in the gastric antrum coiled with redundant loop in the fundus. Small-bowel dilation weakly opacified with ingested contrast media presumably and measuring less than 3 cm maximal caliber, less distended than on previous imaging. Diffuse interloop fluid has increased since imaging from November 27th. Nodularity in the ileocolic mesentery  at along margins of the colon along the anti mesenteric border of the colon is similar grossly.  Vascular/Lymphatic:  Aortic atherosclerosis. No sign of aneurysm. Smooth contour of the IVC. There is no gastrohepatic or hepatoduodenal ligament lymphadenopathy. No retroperitoneal or mesenteric lymphadenopathy.  No pelvic sidewall lymphadenopathy.  Reproductive: Unremarkable.  Other: Moderate LEFT inguinal hernia contains fat. Body wall edema perhaps slightly worse than on previous imaging. Nodularity in the upper abdomen in the peritoneum and omentum.  Musculoskeletal: No acute bone finding. No destructive bone process. Spinal degenerative changes.  IMPRESSION: 1. Signs of aspiration related pneumonia/pneumonitis improved since July 03, 2022. Still with material in lower lobe bronchi and basilar airspace disease. 2. Increased ascites and interloop fluid in this patient with known peritoneal carcinomatosis. 3. Signs of serosal disease of involved bowel loops with diminished dilation of small bowel since  previous imaging. No current evidence of gross obstruction. 4. Increasingly distended gallbladder could reflect fasting state though would suggest correlation with any RIGHT upper quadrant symptoms or laboratory abnormalities and with HIDA scan as warranted. 5. Aortic atherosclerosis.  Aortic Atherosclerosis (ICD10-I70.0).  Electronically Signed   By: Zetta Bills M.D.   On: 07/10/2022 14:05    I personally reviewed recent imaging.   Palliative Care Assessment and Plan Summary of Established Goals of Care and Medical Treatment Preferences   Patient is a 71yo M with a PMHx of HTN, HLD, recurrent UTIs, BPH, and umbilical hernia repair who was admitted on 06/27/2022 for management of abdominal distention. During prolonged hospitalization, patient has been found to have peritoneal carcinomatosis with moderately differentiated adenocarcinoma, SBO, bacterial peritonitis, required TPN, septic shock 2/2 aspiration with E.Coli PNA with acute hypoxic respiratory failure requiring intubation, and AKI.  # Complex medical decision making/goals of care  -Patient intubated on ventilator support though awake and able to interact some.   -Patient and family were updated about patient worsening renal function by [facility] team today as described above in HPI. While continuing appropriate medical interventions, concern was expressed this worsening may be showing that patient's time is limited and if no improvement, may need to transition to care focused on comfort at the end of life.   -Provider support as able to wife and daughter.   -  Code Status: DNR   # Symptom management  -As per primary team   # Psycho-social/Spiritual Support:  - Support System: wife, daughter, 5 grandchildren  # Discharge Planning:  TBD  Thank you for allowing the palliative care team to participate in the care Kardell L Rice.  Chelsea Aus, DO Palliative Care Provider PMT # [phone]  This provider spent a total of 82  minutes providing patient's care.  Includes review of EMR, discussing care with other staff members involved in patient's medical care, obtaining relevant history and information from patient and/or patient's family, and personal review of imaging and lab work. Greater than 50% of the time was spent counseling and coordinating care related to the above assessment and plan.

## 2022-07-11 NOTE — Progress Notes (Signed)
PHARMACY - TOTAL PARENTERAL NUTRITION CONSULT NOTE   Indication: SBO with Prolonged ileus  Patient Measurements: Height: _0  (180.3 cm) Weight: 90.1 kg (198 lb 10.2 oz) IBW/kg (Calculated) : 75.3 TPN AdjBW (KG): 76.2 Body mass index is 27.7 kg/m. Usual Weight:    Assessment:  Post operative abdominal pain with ascites after umbilical hernia repair with mesh on 06/08/22.   On 11/16 he had Lap biopsy and ascites aspiration.  Now has ileus with SBO.  Glucose / Insulin: No h/o DM noted. A1C 4.8.  CBGs / 24hrs:  133-172 - resistant SSI used: 15 units last 24h - hydrocortisone 11/28 >> 12/2 Electrolytes: Na 136 (Has been on D10 while TPN on hold), K remains elevated at 5.9.  Mag and Phos WNL.  Mag 2.4 (goal K>=4 and Mg>=2 with ileus) - TPN held on 12/4 at 9:30 am, D10 hung.  TPN resumed without electrolytes on 12/4 at 1800, but stopped on 12/5 at ~0630 due to IV line problem.   Renal: SCr increased 0.9 > 1.38 > 1385, BUN up to 76 Hepatic: AST/ALT increased 93/87, Alk phos WNL, Tbili elevated at 3.2  (12/4) - Triglycerides peaked at 977 (11/30).  Propofol changed to precedex on 11/29, Lipids removed from TPN on 11/30. - 12/4 TG improved significantly to 181 so lipids resumed in TPN. - 12/5 TG increased to to 295 after only ~ 11 hours of TPN with lipids.   Intake / Output; MIVF:  - Output: NG 480 mls/24 hrs, UOP 950 mls/24hr, LBM 11/28 - Lasix:  11/29, 11/30, 12/1 x2 doses, 12/5 x2 doses - MIVF: NaBicarb 150 mEq in D5 @ 100 ml/hr GI Imaging: 11/15: CT: Mildly dilated loops of small bowel in the left lower quadrant with scattered air-fluid levels, nonspecific for ileus versus early obstruction 11/17: Xray: ileus vs obstruction 11/18: CT:  peritoneal carcinomatosis,  partial bowel obstruction/ileus 11/27 CT: SBO, extensive peritoneal & omental carcinomatosis w/ free ascites (less than prev) GI Surgeries / Procedures:  11/2 hernia repair 11/16 diagnostic laparoscopy 11/29 EGD:  esophagitis, no bleeding; non-bleeding gastric ulcers  Central access: PICC 11/21 TPN start date: 11/21  Nutritional Goals: 12/5 Goal TPN rate is 85 mL/hr to provide 108 g of protein and 2158 kcals per day  RD Assessment: Estimated Needs Total Energy Estimated Needs: 2100-2300 Total Protein Estimated Needs: 95-110g Total Fluid Estimated Needs: 2.1L/day  Current Nutrition:  NPO and TPN  Plan:  Continue D10 infusion while TPN is off.   At 18:00:  Restart TPN at goal rate 85 mL/hr - Decrease lipid concentration in TPN to only 19% of Kcal.  Increased Dextrose to meet nutritional goals.   Continue with no electrolytes in TPN: Na 61mq/L, K 0552m/L, Ca 0 mEq/L, Mg 0 mEq/L, and Phos 52m152m/L. Cl:Ac max Ac Add standard MVI and trace elements to TPN Continue q4h resistant SSI Continue mIVF, per MD Monitor TPN labs on Mon/Thurs and PRN.  BMET, mag and Phos with AM labs.  Recheck TG on Thursday.     Thank you for allowing pharmacy to be a part of this patient's care.  ChrGretta ArabarmD, BCPS [facility] main pharmacy [phone]/12/2021 7:25 AM

## 2022-07-11 NOTE — Progress Notes (Signed)
[facility] called for possible transfer. No ICU beds available.      Noe Gens, MSN, APRN, NP-C, AGACNP-BC [facility] 07/11/2022, 3:26 PM   Please see [email or web address] for pager details.   From 7A-7P if no response, please call [phone] After hours, please call ELink [phone]

## 2022-07-11 NOTE — Progress Notes (Signed)
ANTICOAGULATION CONSULT NOTE - Follow Up Consult  Pharmacy Consult for Heparin Indication: pulmonary embolus  No Known Allergies  Patient Measurements: Height: '5\' 11"'$  (180.3 cm) Weight: 90.1 kg (198 lb 10.2 oz) IBW/kg (Calculated) : 75.3 Heparin Dosing Weight: 81 kg  Vital Signs: Temp: 99.9 F (37.7 C) (12/05 0715) BP: 92/66 (12/05 0715) Pulse Rate: 131 (12/05 0715)  Labs: Recent Labs    07/09/22 0211 07/09/22 0248 07/09/22 2105 07/10/22 0405 07/10/22 1127 07/10/22 1818 07/11/22 0447  HGB 10.0*  --  9.1* 8.5*  --   --  8.2*  HCT 31.1*  --  28.6* 26.8*  --   --  25.9*  PLT 125*  --  121* 122*  --   --  113*  APTT  --  98*  --  135*  --   --   --   HEPARINUNFRC 0.56  --   --  0.48  --   --  0.47  CREATININE 0.92  --   --  1.38* 1.30* 1.35* 1.85*    Estimated Creatinine Clearance: 40.1 mL/min (A) (by C-G formula based on SCr of 1.85 mg/dL (H)).   Medications:  Infusions:   sodium chloride Stopped (07/06/22 2006)   dextrose 85 mL/hr at 07/11/22 0656   fentaNYL infusion INTRAVENOUS 200 mcg/hr (07/11/22 1157)   heparin 1,700 Units/hr (07/11/22 2620)   meropenem (MERREM) IV     micafungin (MYCAMINE) 100 mg in sodium chloride 0.9 % 100 mL IVPB Stopped (07/10/22 1629)   norepinephrine (LEVOPHED) Adult infusion Stopped (07/08/22 0241)   sodium bicarbonate 150 mEq in dextrose 5 % 1,150 mL infusion 100 mL/hr at 07/11/22 0631   TPN ADULT (ION) Stopped (07/11/22 3559)    Assessment: 49 yoM admitted on 74/16/3845 with complications from widespread intraperitoneal malignancy.  Pharmacy has been consulted to dose Heparin for new PE on CT (and suspicious for right common iliac DVT ). Dopplers negative for DVT.  Significant events: - 11/28: hold for maroon NGO & Hg drop to 7.3 - 11/29 EGD: esophagitis, no bleeding; non-bleeding gastric ulcers - 11/30 resume UFH, no bolus  Today, 07/11/2022: Heparin level 0.47, therapeutic on heparin 1700 units/hr CBC:  Hgb decreased to 8.2,  Plt decreased to 113k  No bleeding or complications reported SCr significantly increasing  Goal of Therapy:  Heparin level 0.3-0.7 units/ml Monitor platelets by anticoagulation protocol: Yes   Plan:  No bolus Continue heparin IV infusion at 1700 units/hr Daily heparin level and CBC Continue to monitor H&H and platelets   Gretta Arab PharmD, BCPS [facility] main pharmacy [phone] 07/11/2022 7:45 AM

## 2022-07-11 NOTE — Progress Notes (Signed)
Cameron Rice   DOB:[birth year]   HE#:[id number]   JEH#:[id number]  Oncology follow up   Subjective: Pt remains to be on the vent, has persistent SBO with NG tube suction, high output, no BM. Minimum urine output today. VS stable    Objective:  Vitals:   07/11/22 1530 07/11/22 1545  BP: 111/84 98/71  Pulse: (!) 122 (!) 121  Resp: (!) 22 (!) 23  Temp:    SpO2: 100% 100%    Body mass index is 27.7 kg/m.  Intake/Output Summary (Last 24 hours) at 07/11/2022 1606 Last data filed at 07/11/2022 1500 Gross per 24 hour  Intake 4928.52 ml  Output 931 ml  Net 3997.52 ml     Sclerae icteric  Pt is intubated but awake   No peripheral adenopathy  Heart regular rate and rhythm  Abdomen distended and firm     CBG (last 3)  Recent Labs    07/11/22 0730 07/11/22 1240 07/11/22 1603  GLUCAP 114* 118* 98     Labs:  Urine Studies No results for input(s): "UHGB", "CRYS" in the last 72 hours.  Invalid input(s): "UACOL", "UAPR", "USPG", "UPH", "UTP", "UGL", "UKET", "UBIL", "UNIT", "UROB", "ULEU", "UEPI", "UWBC", "URBC", "UBAC", "CAST", "UCOM", "BILUA"  Basic Metabolic Panel: Recent Labs  Lab 07/07/22 0348 07/08/22 0505 07/09/22 0211 07/10/22 0405 07/10/22 0523 07/10/22 1127 07/10/22 1818 07/11/22 0447  NA 140 142 141 140  --  142 138 136  K 4.6 3.8 5.3* 6.3*   < > 6.4* 5.8* 5.9*  CL 109 112* 114* 115*  --  114* 111 107  CO2 '24 23 22 '$ 20*  --  20* 21* 22  GLUCOSE 148* 111* 154* 151*  --  81 146* 156*  BUN 49* 46* 48* 64*  --  70* 69* 76*  CREATININE 0.98 0.89 0.92 1.38*  --  1.30* 1.35* 1.85*  CALCIUM 8.0* 8.0* 8.2* 8.4*  --  8.4* 8.6* 8.2*  MG 2.1 2.0 2.2 2.4  --   --   --  2.3  PHOS 2.4* 3.3 2.8 4.1  --   --   --  3.7   < > = values in this interval not displayed.   GFR Estimated Creatinine Clearance: 40.1 mL/min (A) (by C-G formula based on SCr of 1.85 mg/dL (H)). Liver Function Tests: Recent Labs  Lab 07/06/22 0002 07/08/22 0505 07/09/22 0211 07/10/22 0405  AST  45* 50* 37 93*  ALT 33 67* 16 87*  ALKPHOS 118 91 92 97  BILITOT 3.2* 4.1* 3.2* 3.2*  PROT 5.2* 6.0* 6.0* 6.0*  ALBUMIN 1.9* 3.2* 2.5* 2.9*   No results for input(s): "LIPASE", "AMYLASE" in the last 168 hours. No results for input(s): "AMMONIA" in the last 168 hours.  Coagulation profile Recent Labs  Lab 07/04/22 1830  INR 1.4*    CBC: Recent Labs  Lab 07/07/22 1400 07/08/22 0505 07/08/22 1655 07/09/22 0211 07/09/22 2105 07/10/22 0405 07/11/22 0447  WBC 72.5* 62.6*  --  71.5* 73.0* 65.2* 55.9*  NEUTROABS 54.4*  --   --   --   --   --   --   HGB 7.9* 6.8* 9.8* 10.0* 9.1* 8.5* 8.2*  HCT 24.6* 21.5* 30.7* 31.1* 28.6* 26.8* 25.9*  MCV 90.8 90.0  --  92.3 92.9 92.7 93.2  PLT 147* 120*  --  125* 121* 122* 113*   Cardiac Enzymes: No results for input(s): "CKTOTAL", "CKMB", "CKMBINDEX", "TROPONINI" in the last 168 hours. BNP: Invalid input(s): "POCBNP" CBG: Recent Labs  Lab 07/11/22 0036 07/11/22 0404 07/11/22 0730 07/11/22 1240 07/11/22 1603  GLUCAP 142* 130* 114* 118* 98   D-Dimer No results for input(s): "DDIMER" in the last 72 hours.  Hgb A1c No results for input(s): "HGBA1C" in the last 72 hours. Lipid Profile Recent Labs    07/10/22 0405 07/11/22 0447  TRIG 181* 295*   Thyroid function studies No results for input(s): "TSH", "T4TOTAL", "T3FREE", "THYROIDAB" in the last 72 hours.  Invalid input(s): "FREET3"  Anemia work up No results for input(s): "VITAMINB12", "FOLATE", "FERRITIN", "TIBC", "IRON", "RETICCTPCT" in the last 72 hours.  Microbiology Recent Results (from the past 240 hour(s))  Culture, blood (Routine X 2) w Reflex to ID Panel     Status: None   Collection Time: 07/02/22  7:43 PM   Specimen: BLOOD RIGHT HAND  Result Value Ref Range Status   Specimen Description   Final    BLOOD RIGHT HAND Performed at [facility], [place] [address], [place], [place] [address]    Special Requests   Final    BOTTLES DRAWN AEROBIC ONLY Blood  Culture results may not be optimal due to an inadequate volume of blood received in culture bottles Performed at [facility] [address] [place], [place] [address]    Culture   Final    NO GROWTH 5 DAYS Performed at [facility], [place] [address], [place], [place] [address]    Report Status 07/07/2022 FINAL  Final  Culture, blood (Routine X 2) w Reflex to ID Panel     Status: None   Collection Time: 07/02/22  7:43 PM   Specimen: BLOOD RIGHT FOREARM  Result Value Ref Range Status   Specimen Description   Final    BLOOD RIGHT FOREARM Performed at [facility], [place] [address], [place], [place] [address]    Special Requests   Final    IN PEDIATRIC BOTTLE Blood Culture adequate volume Performed at [facility] [address] [place], [place] [address]    Culture   Final    NO GROWTH 5 DAYS Performed at [facility], [place] [address] [place], [place] [address]    Report Status 07/07/2022 FINAL  Final  Peritoneal fluid culture w Gram Stain     Status: None   Collection Time: 07/03/22 12:43 PM   Specimen: Peritoneal Fluid  Result Value Ref Range Status   Specimen Description   Final    FLUID PERITONEAL Performed at [facility], [place] [address], [place], [place] [address]    Special Requests   Final    NONE Performed at [facility], [place] [address], [place], [place] [address]    Gram Stain NO WBC SEEN NO ORGANISMS SEEN   Final   Culture   Final    NO GROWTH 3 DAYS Performed at [facility], [place] [address], [place], [place] [address]    Report Status 07/07/2022 FINAL  Final  Culture, Respiratory w Gram Stain     Status: None   Collection Time: 07/04/22  2:18 PM   Specimen: Tracheal Aspirate; Respiratory  Result Value Ref Range Status   Specimen Description   Final    TRACHEAL ASPIRATE Performed at [facility] [address] [place], [place] [address]    Special  Requests   Final    NONE Performed at [facility], [place] [address] [place], [place] [address]  Gram Stain   Final    FEW SQUAMOUS EPITHELIAL CELLS PRESENT FEW WBC PRESENT,BOTH PMN AND MONONUCLEAR FEW GRAM NEGATIVE RODS FEW GRAM POSITIVE COCCI IN PAIRS Performed at [facility], [place] [address] [place], [place] [address]    Culture   Final    ABUNDANT ESCHERICHIA COLI Confirmed Extended Spectrum Beta-Lactamase Producer (ESBL).  In bloodstream infections from ESBL organisms, carbapenems are preferred over piperacillin/tazobactam. They are shown to have a lower risk of mortality.    Report Status 07/07/2022 FINAL  Final   Organism ID, Bacteria ESCHERICHIA COLI  Final      Susceptibility   Escherichia coli - MIC*    AMPICILLIN >=32 RESISTANT Resistant     CEFAZOLIN >=64 RESISTANT Resistant     CEFEPIME >=32 RESISTANT Resistant     CEFTAZIDIME >=64 RESISTANT Resistant     CEFTRIAXONE >=64 RESISTANT Resistant     CIPROFLOXACIN >=4 RESISTANT Resistant     GENTAMICIN >=16 RESISTANT Resistant     IMIPENEM <=0.25 SENSITIVE Sensitive     TRIMETH/SULFA >=320 RESISTANT Resistant     AMPICILLIN/SULBACTAM >=32 RESISTANT Resistant     PIP/TAZO >=128 RESISTANT Resistant     * ABUNDANT ESCHERICHIA COLI  Culture, Respiratory w Gram Stain     Status: None   Collection Time: 07/07/22  8:46 AM   Specimen: Tracheal Aspirate; Respiratory  Result Value Ref Range Status   Specimen Description   Final    TRACHEAL ASPIRATE Performed at [facility] [address], [place], [place] [address]    Special Requests   Final    NONE Performed at [facility], [place] [address] [place], [place] [address]    Gram Stain   Final    ABUNDANT WBC PRESENT, PREDOMINANTLY PMN NO ORGANISMS SEEN    Culture   Final    RARE CANDIDA LUSITANIAE NO STAPHYLOCOCCUS AUREUS ISOLATED No Pseudomonas species isolated Performed at [facility] [address] [place], [place] [address]    Report Status 07/10/2022 FINAL  Final  Culture, blood (Routine X 2) w Reflex to ID Panel     Status: None (Preliminary result)   Collection Time: 07/07/22 11:23 AM   Specimen: BLOOD  Result Value Ref Range Status   Specimen Description   Final    BLOOD BLOOD RIGHT HAND Performed at [facility] [address] [place], [place] [address]    Special Requests   Final    IN PEDIATRIC BOTTLE Blood Culture adequate volume Performed at [facility] [address], [place], [place] [address]    Culture   Final    NO GROWTH 4 DAYS Performed at [facility], [place] [address], [place], [place] [address]    Report Status PENDING  Incomplete  MRSA Next Gen by PCR, Nasal     Status: None   Collection Time: 07/07/22 12:22 PM   Specimen: Nasal Mucosa; Nasal Swab  Result Value Ref Range Status   MRSA by PCR Next Gen NOT DETECTED NOT DETECTED Final    Comment: (NOTE) The GeneXpert MRSA Assay (FDA approved for NASAL specimens only), is one component of a comprehensive MRSA colonization surveillance program. It is not intended to diagnose MRSA infection nor to guide or monitor treatment for MRSA infections. Test performance is not FDA approved in patients less than 51 years old. Performed at [facility], [place] [address] [place],  [address]   Culture, blood (Routine  X 2) w Reflex to ID Panel     Status: None (Preliminary result)   Collection Time: 07/07/22  1:50 PM   Specimen: BLOOD RIGHT HAND  Result Value Ref Range Status   Specimen Description   Final    BLOOD RIGHT HAND Performed at [facility], [place] [address], [place], [place] [address]    Special Requests   Final    IN PEDIATRIC BOTTLE Blood Culture adequate volume Performed at [facility] [address], [place], [place] [address]    Culture   Final    NO GROWTH 4 DAYS Performed at [facility], [place] [address] [place], [place] [address]    Report Status PENDING  Incomplete  Body fluid culture w Gram Stain     Status: None   Collection Time: 07/07/22  4:31 PM   Specimen: Peritoneal Washings; Body Fluid  Result Value Ref Range Status   Specimen Description   Final    PERITONEAL Performed at [facility] [address] [place], [place] [address]    Special Requests   Final    NONE Performed at  [facility], [place] [address], [place], [place] [address]    Gram Stain NO WBC SEEN NO ORGANISMS SEEN   Final   Culture   Final    NO GROWTH 3 DAYS Performed at [facility], [place] [address] [place], [place] [address]    Report Status 07/11/2022 FINAL  Final      Studies:  CT ABDOMEN PELVIS WO CONTRAST  Result Date: 07/10/2022 CLINICAL DATA:  A 71 year old male presents for evaluation of complications related to pneumonia. Also with history of peritoneal carcinomatosis of uncertain etiology. * Tracking Code: BO * EXAM: CT CHEST, ABDOMEN AND PELVIS WITHOUT CONTRAST TECHNIQUE: Multidetector CT imaging of the chest, abdomen and pelvis was performed following the standard protocol without IV contrast. RADIATION DOSE REDUCTION: This exam was performed according to the departmental dose-optimization program which includes automated exposure control, adjustment of the mA and/or kV according to patient size and/or use of iterative reconstruction technique. COMPARISON:  Multiple prior studies have been performed recently most recent imaging from November 26 of the chest with CT and November twenty-seventh of the abdomen and pelvis with CT. FINDINGS: CT CHEST FINDINGS Cardiovascular: Calcified aortic atherosclerotic changes are mild. LEFT-sided IJ central venous catheter terminates in the area of the caval to atrial junction. LEFT-sided PICC line terminating in the low SVC. No pericardial nodularity. Trace pericardial effusion. Central pulmonary  vasculature is of normal caliber. Limited assessment of cardiovascular structures given lack of intravenous contrast. Mediastinum/Nodes: Endotracheal tube in place terminating in the mid to low trachea. Gastric tube coursing into the abdomen. No signs of adenopathy in the chest. Lungs/Pleura: Patchy basilar airspace disease with more confluent areas dependently. Seen mainly in the lower lobes and improved since the CT of the abdomen of July 03, 2022 still with signs of airspace disease in filling of lower lobe bronchi with material worse on the RIGHT than the LEFT. Patchy areas of ground-glass are seen elsewhere in the chest and or mild and not substantially changed. Musculoskeletal: See below for full musculoskeletal details. CT ABDOMEN PELVIS FINDINGS Hepatobiliary: Scalloped margin of the liver. Dilated gallbladder up to 5 cm in the axial plane. Pancreas: No gross pancreatic abnormality. Spleen: Peritoneal fluid in nodularity about the anterior spleen. Adrenals/Urinary Tract: Adrenal glands are normal. Mild perinephric stranding similar to prior imaging. No hydronephrosis.  Gas in the urinary bladder. Stomach/Bowel: Gastric tube in place, tip in the gastric antrum coiled with redundant loop in the fundus. Small-bowel dilation weakly opacified with ingested contrast media presumably and measuring less than 3 cm maximal caliber, less distended than on previous imaging. Diffuse interloop fluid has increased since imaging from November 27th. Nodularity in the ileocolic mesentery at along margins of the colon along the anti mesenteric border of the colon is similar grossly. Vascular/Lymphatic: Aortic atherosclerosis. No sign of aneurysm. Smooth contour of the IVC. There is no gastrohepatic or hepatoduodenal ligament lymphadenopathy. No retroperitoneal or mesenteric lymphadenopathy. No pelvic sidewall lymphadenopathy. Reproductive: Unremarkable. Other: Moderate LEFT inguinal hernia contains fat. Body wall edema  perhaps slightly worse than on previous imaging. Nodularity in the upper abdomen in the peritoneum and omentum. Musculoskeletal: No acute bone finding. No destructive bone process. Spinal degenerative changes. IMPRESSION: 1. Signs of aspiration related pneumonia/pneumonitis improved since July 03, 2022. Still with material in lower lobe bronchi and basilar airspace disease. 2. Increased ascites and interloop fluid in this patient with known peritoneal carcinomatosis. 3. Signs of serosal disease of involved bowel loops with diminished dilation of small bowel since previous imaging. No current evidence of gross obstruction. 4. Increasingly distended gallbladder could reflect fasting state though would suggest correlation with any RIGHT upper quadrant symptoms or laboratory abnormalities and with HIDA scan as warranted. 5. Aortic atherosclerosis. Aortic Atherosclerosis (ICD10-I70.0). Electronically Signed   By: Zetta Bills M.D.   On: 07/10/2022 14:05   CT CHEST WO CONTRAST  Result Date: 07/10/2022 CLINICAL DATA:  A 71 year old male presents for evaluation of complications related to pneumonia. Also with history of peritoneal carcinomatosis of uncertain etiology. * Tracking Code: BO * EXAM: CT CHEST, ABDOMEN AND PELVIS WITHOUT CONTRAST TECHNIQUE: Multidetector CT imaging of the chest, abdomen and pelvis was performed following the standard protocol without IV contrast. RADIATION DOSE REDUCTION: This exam was performed according to the departmental dose-optimization program which includes automated exposure control, adjustment of the mA and/or kV according to patient size and/or use of iterative reconstruction technique. COMPARISON:  Multiple prior studies have been performed recently most recent imaging from November 26 of the chest with CT and November twenty-seventh of the abdomen and pelvis with CT. FINDINGS: CT CHEST FINDINGS Cardiovascular: Calcified aortic atherosclerotic changes are mild. LEFT-sided IJ  central venous catheter terminates in the area of the caval to atrial junction. LEFT-sided PICC line terminating in the low SVC. No pericardial nodularity. Trace pericardial effusion. Central pulmonary vasculature is of normal caliber. Limited assessment of cardiovascular structures given lack of intravenous contrast. Mediastinum/Nodes: Endotracheal tube in place terminating in the mid to low trachea. Gastric tube coursing into the abdomen. No signs of adenopathy in the chest. Lungs/Pleura: Patchy basilar airspace disease with more confluent areas dependently. Seen mainly in the lower lobes and improved since the CT of the abdomen of July 03, 2022 still with signs of airspace disease in filling of lower lobe bronchi with material worse on the RIGHT than the LEFT. Patchy areas of ground-glass are seen elsewhere in the chest and or mild and not substantially changed. Musculoskeletal: See below for full musculoskeletal details. CT ABDOMEN PELVIS FINDINGS Hepatobiliary: Scalloped margin of the liver. Dilated gallbladder up to 5 cm in the axial plane. Pancreas: No gross pancreatic abnormality. Spleen: Peritoneal fluid in nodularity about the anterior spleen. Adrenals/Urinary Tract: Adrenal glands are normal. Mild perinephric stranding similar to prior imaging. No hydronephrosis. Gas in the urinary bladder. Stomach/Bowel: Gastric tube in place, tip  in the gastric antrum coiled with redundant loop in the fundus. Small-bowel dilation weakly opacified with ingested contrast media presumably and measuring less than 3 cm maximal caliber, less distended than on previous imaging. Diffuse interloop fluid has increased since imaging from November 27th. Nodularity in the ileocolic mesentery at along margins of the colon along the anti mesenteric border of the colon is similar grossly. Vascular/Lymphatic: Aortic atherosclerosis. No sign of aneurysm. Smooth contour of the IVC. There is no gastrohepatic or hepatoduodenal ligament  lymphadenopathy. No retroperitoneal or mesenteric lymphadenopathy. No pelvic sidewall lymphadenopathy. Reproductive: Unremarkable. Other: Moderate LEFT inguinal hernia contains fat. Body wall edema perhaps slightly worse than on previous imaging. Nodularity in the upper abdomen in the peritoneum and omentum. Musculoskeletal: No acute bone finding. No destructive bone process. Spinal degenerative changes. IMPRESSION: 1. Signs of aspiration related pneumonia/pneumonitis improved since July 03, 2022. Still with material in lower lobe bronchi and basilar airspace disease. 2. Increased ascites and interloop fluid in this patient with known peritoneal carcinomatosis. 3. Signs of serosal disease of involved bowel loops with diminished dilation of small bowel since previous imaging. No current evidence of gross obstruction. 4. Increasingly distended gallbladder could reflect fasting state though would suggest correlation with any RIGHT upper quadrant symptoms or laboratory abnormalities and with HIDA scan as warranted. 5. Aortic atherosclerosis. Aortic Atherosclerosis (ICD10-I70.0). Electronically Signed   By: Zetta Bills M.D.   On: 07/10/2022 14:05   DG Abd 1 View  Result Date: 07/09/2022 CLINICAL DATA:  NG placement. EXAM: ABDOMEN - 1 VIEW COMPARISON:  Abdominal radiograph dated 07/07/2022. FINDINGS: Enteric tube extends below the diaphragm and makes a loop in the proximal stomach with tip in the region of the body of the stomach. Partially visualized additional support apparatus in similar position. Small left pleural effusion and left lung base atelectasis or infiltrate. IMPRESSION: Enteric tube with tip in the body of the stomach. Electronically Signed   By: Anner Crete M.D.   On: 07/09/2022 21:40    Assessment: 71 y.o. 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  He is making minimal urine. -Palliative care Dr. Vinetta Bergamo has been seeing pt, along with ICU Dr. Tamala Julian, they discussed [place] and overall extremely poor prognoses, comfort care was brought up with family.  -Unfortunately he has developed multiorgan failures, his persistent small bowel obstruction/ileus is likely related to his bulky peritoneal metastasis and ascites, he is not a candidate for surgery or chemotherapy. I agree with Drs. Mims and Tamala Julian, he is unlikely survive this hospital stay, and comfort care in near future is appropriate, if he does not improve. -I spoke with his wife, daughter and niece at bedside today.  They expressed understanding.  Emotional support given.   Cameron Merle, MD 07/11/2022

## 2022-07-12 DIAGNOSIS — K56609 Unspecified intestinal obstruction, unspecified as to partial versus complete obstruction: Secondary | ICD-10-CM | POA: Diagnosis not present

## 2022-07-12 DIAGNOSIS — N390 Urinary tract infection, site not specified: Secondary | ICD-10-CM

## 2022-07-12 DIAGNOSIS — R188 Other ascites: Secondary | ICD-10-CM | POA: Diagnosis not present

## 2022-07-12 DIAGNOSIS — Z515 Encounter for palliative care: Secondary | ICD-10-CM | POA: Diagnosis not present

## 2022-07-12 DIAGNOSIS — J9601 Acute respiratory failure with hypoxia: Secondary | ICD-10-CM | POA: Diagnosis not present

## 2022-07-12 DIAGNOSIS — Z7189 Other specified counseling: Secondary | ICD-10-CM | POA: Diagnosis not present

## 2022-07-12 DIAGNOSIS — R748 Abnormal levels of other serum enzymes: Secondary | ICD-10-CM | POA: Diagnosis not present

## 2022-07-12 DIAGNOSIS — N179 Acute kidney failure, unspecified: Secondary | ICD-10-CM

## 2022-07-12 LAB — CULTURE, BLOOD (ROUTINE X 2)
Culture: NO GROWTH
Culture: NO GROWTH
Special Requests: ADEQUATE
Special Requests: ADEQUATE

## 2022-07-12 LAB — CBC
HCT: 25.2 % — ABNORMAL LOW (ref 39.0–52.0)
Hemoglobin: 7.9 g/dL — ABNORMAL LOW (ref 13.0–17.0)
MCH: 28.8 pg (ref 26.0–34.0)
MCHC: 31.3 g/dL (ref 30.0–36.0)
MCV: 92 fL (ref 80.0–100.0)
Platelets: 118 10*3/uL — ABNORMAL LOW (ref 150–400)
RBC: 2.74 MIL/uL — ABNORMAL LOW (ref 4.22–5.81)
RDW: 21.2 % — ABNORMAL HIGH (ref 11.5–15.5)
WBC: 45.7 10*3/uL — ABNORMAL HIGH (ref 4.0–10.5)
nRBC: 1.6 % — ABNORMAL HIGH (ref 0.0–0.2)

## 2022-07-12 LAB — BASIC METABOLIC PANEL
Anion gap: 12 (ref 5–15)
BUN: 98 mg/dL — ABNORMAL HIGH (ref 8–23)
CO2: 18 mmol/L — ABNORMAL LOW (ref 22–32)
Calcium: 8.3 mg/dL — ABNORMAL LOW (ref 8.9–10.3)
Chloride: 102 mmol/L (ref 98–111)
Creatinine, Ser: 3.09 mg/dL — ABNORMAL HIGH (ref 0.61–1.24)
GFR, Estimated: 21 mL/min — ABNORMAL LOW (ref >60)
Glucose, Bld: 155 mg/dL — ABNORMAL HIGH (ref 70–99)
Potassium: 5.5 mmol/L — ABNORMAL HIGH (ref 3.5–5.1)
Sodium: 132 mmol/L — ABNORMAL LOW (ref 135–145)

## 2022-07-12 LAB — GLUCOSE, CAPILLARY
Glucose-Capillary: 142 mg/dL — ABNORMAL HIGH (ref 70–99)
Glucose-Capillary: 135 mg/dL — ABNORMAL HIGH (ref 70–99)
Glucose-Capillary: 130 mg/dL — ABNORMAL HIGH (ref 70–99)
Glucose-Capillary: 121 mg/dL — ABNORMAL HIGH (ref 70–99)
Glucose-Capillary: 111 mg/dL — ABNORMAL HIGH (ref 70–99)
Glucose-Capillary: 116 mg/dL — ABNORMAL HIGH (ref 70–99)
Glucose-Capillary: 117 mg/dL — ABNORMAL HIGH (ref 70–99)

## 2022-07-12 LAB — BODY FLUID CELL COUNT WITH DIFFERENTIAL
Eos, Fluid: 0 %
Lymphs, Fluid: 11 %
Monocyte-Macrophage-Serous Fluid: 10 % — ABNORMAL LOW (ref 50–90)
Neutrophil Count, Fluid: 79 % — ABNORMAL HIGH (ref 0–25)
Total Nucleated Cell Count, Fluid: 790 cu mm (ref 0–1000)

## 2022-07-12 LAB — ALBUMIN, PLEURAL OR PERITONEAL FLUID: Albumin, Fluid: 1.5 g/dL

## 2022-07-12 LAB — MAGNESIUM: Magnesium: 2.4 mg/dL (ref 1.7–2.4)

## 2022-07-12 LAB — HEPARIN LEVEL (UNFRACTIONATED): Heparin Unfractionated: 0.49 IU/mL (ref 0.30–0.70)

## 2022-07-12 LAB — PHOSPHORUS: Phosphorus: 6 mg/dL — ABNORMAL HIGH (ref 2.5–4.6)

## 2022-07-12 MED ORDER — ALBUMIN HUMAN 25 % IV SOLN
50.0000 g | Freq: Once | INTRAVENOUS | Status: AC
  Administered 2022-07-12: 50 g via INTRAVENOUS

## 2022-07-12 MED ORDER — FUROSEMIDE 10 MG/ML IJ SOLN
40.0000 mg | Freq: Once | INTRAMUSCULAR | Status: AC
  Administered 2022-07-12: 40 mg via INTRAVENOUS
  Filled 2022-07-12: qty 4

## 2022-07-12 MED ORDER — SODIUM CHLORIDE 0.9 % IV SOLN: INTRAVENOUS | Status: DC | PRN

## 2022-07-12 MED ORDER — TRAVASOL 10 % IV SOLN
INTRAVENOUS | Status: AC
  Filled 2022-07-12: qty 1081.2

## 2022-07-12 NOTE — Progress Notes (Signed)
Chaplain engaged in a follow-up visit with Michelle, his wife, and daughter at the bedside.  They both described Cameron Rice as being vibrant, loving, a man of faith, and a family man.  [name] has one daughter and five grandchildren.  He has been surrounded by support and community during this time.  Family also described his healthcare journey which has been shocking to them all.  Wife shared that [name] has been relatively healthy and walked frequently.  They had no idea that he had cancer and have found that news to be jarring.   Chaplain offered reflective listening, space for storytelling, and spiritual care support.  Chaplain will continue to follow-up.    07/12/22 1000  Clinical Encounter Type  Visited With Patient and family together  Visit Type Follow-up;Spiritual support

## 2022-07-12 NOTE — Progress Notes (Signed)
Daily Progress Note   Patient Name: Cameron Rice       Date: 07/12/2022 DOB: [birth year]  Age: 71 y.o. MRN#: [id number] Attending Physician: Maryjane Hurter, MD Primary Care Physician: Flossie Buffy, NP Admit Date: 06/08/2022 Length of Stay: 21 days  Reason for Consultation/Follow-up: Establishing goals of care  Subjective:   CC: Patient remains intubated and is lethargic today when seen.  Following up with family regarding complex medical decision making.  Subjective:  Presented to patient's bedside in a.m.  Patient remains intubated and is lethargic as compared to yesterday when seen.  No family was initially present at bedside.  Discussed care with primary PCCM team extensively.  Checked in multiple times during the day with bedside RN for updates.  Later in afternoon was able to meet with family (wife, daughter, son-in-law) in visiting area to provide support.  They updated me regarding the plan for patient's medical care and awaiting further lab work from tomorrow to continue monitoring patient's worsening renal function.  Described patient has been sleeping more today. Noted 4 out of the 5 grandchildren have had a chance to visit with patient and 5th grandchild will likely be coming in tomorrow. Spent time providing emotional support through active listening.  Wife and daughter did inquire what a transition to comfort focused care would look like.  With permission, was able to detail the transition of care to focusing on patient's comfort at the end of life.  Described the process of a palliative extubation.  Discussed with patient's current dependency on ventilator, time would likely be short in terms of minutes to hours.  Family noted they had been told that death due to renal failure would allow patient to sleep and pass away peacefully.  Described that peaceful death can also be obtained through a palliative extubation with appropriate medication management.  Also  discussed that remaining on the ventilator can be uncomfortable which why the patient is receiving pain medications now.  Family acknowledged hearing this.  Spent time answering questions and providing support.  Noted PMT will continue to follow along to discuss patient's care moving forward.  Thanked family for allowing me to visit with them today.  Review of Systems  Objective:   Vital Signs:  BP (!) 93/55   Pulse (!) 122   Temp 97.8 F (36.6 C) (Axillary)   Resp (!) 31   Ht '5\' 11"'$  (1.803 m)   Wt 89 kg   SpO2 100%   BMI 27.37 kg/m   Physical Exam: General: intubated, lethargic, frail, critically ill appearing HENT: ETT in place Cardiovascular: tachycardia noted Respiratory: intubated on ventilator support Abdomen: distended Skin: right hand discoloration Neuro: lethargic  Imaging: I personally reviewed recent imaging.   Assessment & Plan:   Assessment: Patient is a 71yo M with a PMHx of HTN, HLD, recurrent UTIs, BPH, and umbilical hernia repair who was admitted on 06/18/2022 for management of abdominal distention. During prolonged hospitalization, patient has been found to have peritoneal carcinomatosis with moderately differentiated adenocarcinoma, SBO, bacterial peritonitis, required TPN, septic shock 2/2 aspiration with E.Coli PNA with acute hypoxic respiratory failure requiring intubation, and AKI. PMT following to assist with complex medical decision making.  Recommendations/Plan: # Complex medical decision making/goals of care:  - Patient intubated on ventilator support and lethargic.                 - Family have continued discussions with PCCM team regarding patient's worsening medical status. PMT continuing to support discussions  with family as described above in HPI. Wife and daughter did inquire about transition to comfort focused care would entail and so described the details of this transition and palliative extubation with them today. Currently continuing  appropriate medical care and family awaiting further lab results from tomorrow to assist in decision making moving forward. (Renal function deteriorating and patient appropriately not a candidate for RRT due to his underlying cancer process and multiple irreversible comorbidities.)   -Should patient continue to further decompensate, would inform family of this and recommendation to transition to comfort focused care at that time instead of further escalation of interventions knowing it will not reverse his underlying disease process that is leading to the end of his life.   -  Code Status: DNR  # Symptom management:  -  -As per primary team    # Psychosocial Support:  -- Support System: wife, daughter, 5 grandchildren   - Appreciate chaplain's continued support.  # Discharge Planning: TBD  Discussed with: bedside RN, primary team, family  Thank you for allowing the palliative care team to participate in the care Cameron Rice.  Chelsea Aus, DO Palliative Care Provider PMT # [phone]  If patient remains symptomatic despite maximum doses, please call PMT at [phone] between 0700 and 1900. Outside of these hours, please call attending, as PMT does not have night coverage.  This provider spent a total of 52 minutes providing patient's care.  Includes review of EMR, discussing care with other staff members involved in patient's medical care, obtaining relevant history and information from patient and/or patient's family, and personal review of imaging and lab work. Greater than 50% of the time was spent counseling and coordinating care related to the above assessment and plan.

## 2022-07-12 NOTE — Procedures (Signed)
Paracentesis Procedure Note  Calyn L Martinique  [id number]  12-Dec-1950  Date:07/12/22  Time:4:56 PM   Provider Performing:Andersen Mckiver M Verlee Monte    Procedure: Paracentesis with imaging guidance (40370)  Indication(s) Ascites  Consent Risks of the procedure as well as the alternatives and risks of each were explained to the patient and/or caregiver.  Consent for the procedure was obtained and is signed in the bedside chart  Anesthesia Topical only with 1% lidocaine    Time Out Verified patient identification, verified procedure, site/side was marked, verified correct patient position, special equipment/implants available, medications/allergies/relevant history reviewed, required imaging and test results available.   Sterile Technique Maximal sterile technique including full sterile barrier drape, hand hygiene, sterile gown, sterile gloves, mask, hair covering, sterile ultrasound probe cover (if used).   Procedure Description Ultrasound used to identify appropriate peritoneal anatomy for placement and overlying skin marked.  Area of drainage cleaned and draped in sterile fashion. Lidocaine was used to anesthetize the skin and subcutaneous tissue.  3.5L of amber appearing fluid was drained. Catheter then removed and bandaid applied to site.     Complications/Tolerance None; patient tolerated the procedure well.   EBL Minimal   Specimen(s) Peritoneal fluid sent for culture, cell count/diff

## 2022-07-12 NOTE — Progress Notes (Signed)
ANTICOAGULATION CONSULT NOTE - Follow Up Consult  Pharmacy Consult for Heparin Indication: pulmonary embolus  No Known Allergies  Patient Measurements: Height: '5\' 11"'$  (180.3 cm) Weight: 89 kg (196 lb 3.4 oz) IBW/kg (Calculated) : 75.3 Heparin Dosing Weight: 81 kg  Vital Signs: Temp: 96.9 F (36.1 C) (12/06 0801) Temp Source: Axillary (12/06 0801) BP: 89/59 (12/06 0700) Pulse Rate: 120 (12/06 0700)  Labs: Recent Labs    07/10/22 0405 07/10/22 1127 07/10/22 1818 07/11/22 0447 07/11/22 1531 07/12/22 0438  HGB 8.5*  --   --  8.2*  --  7.9*  HCT 26.8*  --   --  25.9*  --  25.2*  PLT 122*  --   --  113*  --  118*  APTT 135*  --   --   --   --   --   HEPARINUNFRC 0.48  --   --  0.47  --  0.49  CREATININE 1.38*   < > 1.35* 1.85*  --  3.09*  TROPONINIHS  --   --   --   --  30*  --    < > = values in this interval not displayed.     Estimated Creatinine Clearance: 24 mL/min (A) (by C-G formula based on SCr of 3.09 mg/dL (H)).   Medications:  Infusions:   sodium chloride Stopped (07/06/22 2006)   fentaNYL infusion INTRAVENOUS 125 mcg/hr (07/12/22 0700)   heparin 1,700 Units/hr (07/12/22 0700)   meropenem (MERREM) IV Stopped (07/11/22 2254)   micafungin (MYCAMINE) 100 mg in sodium chloride 0.9 % 100 mL IVPB Stopped (07/11/22 1707)   norepinephrine (LEVOPHED) Adult infusion Stopped (07/08/22 0241)   TPN ADULT (ION) 85 mL/hr at 07/12/22 0700    Assessment: Cameron Rice admitted on 03/49/1791 with complications from widespread intraperitoneal malignancy.  Pharmacy has been consulted to dose Heparin for new PE on CT (and suspicious for right common iliac DVT ). Dopplers negative for DVT.  Significant events: - 11/28: hold for maroon NGO & Hg drop to 7.3 - 11/29 EGD: esophagitis, no bleeding; non-bleeding gastric ulcers - 11/30 resume UFH, no bolus  Today, 07/12/2022: Heparin level continues to be therapeutic on current IV heparin rate of 1700 units/hr CBC:  Hgb decreased to  7.9, Plt 118  No bleeding or complications reported SCr significantly increasing  Goal of Therapy:  Heparin level 0.3-0.7 units/ml Monitor platelets by anticoagulation protocol: Yes   Plan:  No bolus Continue heparin IV infusion at 1700 units/hr Daily heparin level and CBC Continue to monitor H&H and platelets   Adrian Saran, PharmD, BCPS Secure Chat if ?s 07/12/2022 8:35 AM

## 2022-07-12 NOTE — Progress Notes (Signed)
PHARMACY - TOTAL PARENTERAL NUTRITION CONSULT NOTE   Indication: SBO with Prolonged ileus  Patient Measurements: Height: 5' 11" (180.3 cm) Weight: 89 kg (196 lb 3.4 oz) IBW/kg (Calculated) : 75.3 TPN AdjBW (KG): 76.2 Body mass index is 27.37 kg/m. Usual Weight:    Assessment:  Post operative abdominal pain with ascites after umbilical hernia repair with mesh on 06/08/22.   On 11/16 he had Lap biopsy and ascites aspiration.  Now has ileus with SBO.  Glucose / Insulin: No h/o DM noted. A1C 4.8.  CBGs / 24hrs at goal < 150 - resistant SSI used: 12 units last 24h - hydrocortisone 11/28 >> 12/2 Electrolytes: Na 132 (Had been on D10 while TPN on hold), K remains elevated at 5.5.  Mag WNL. Phos elevated at 6. (goal K>=4 and Mg>=2 with ileus) - TPN held on 12/4 at 9:30 am, D10 hung.  TPN resumed without electrolytes on 12/4 at 1800, but stopped on 12/5 at ~0630 due to IV line problem then resumed again 12/5 PM Renal: SCr increased 0.9 > 1.38 > 1.85 >> 3.09, BUN up to 98 Hepatic: AST/ALT increased 93/87, Alk phos WNL, Tbili elevated at 3.2  (12/4) - Triglycerides peaked at 977 (11/30).  Propofol changed to precedex on 11/29, Lipids removed from TPN on 11/30. - 12/4 TG improved significantly to 181 so lipids resumed in TPN. - 12/5 TG increased to to 295 after only ~ 11 hours of TPN with lipids.   Intake / Output; MIVF:  - Output: NG 900 mls/24 hrs, UOP only 161 mls/24hr, LBM 11/28 - Lasix:  11/29, 11/30, 12/1 x2 doses, 12/5 x2 doses, 12/6 x 1 - MIVF: none GI Imaging: 11/15: CT: Mildly dilated loops of small bowel in the left lower quadrant with scattered air-fluid levels, nonspecific for ileus versus early obstruction 11/17: Xray: ileus vs obstruction 11/18: CT:  peritoneal carcinomatosis,  partial bowel obstruction/ileus 11/27 CT: SBO, extensive peritoneal & omental carcinomatosis w/ free ascites (less than prev) GI Surgeries / Procedures:  11/2 hernia repair 11/16 diagnostic  laparoscopy 11/29 EGD: esophagitis, no bleeding; non-bleeding gastric ulcers  Central access: PICC 11/21 TPN start date: 11/21  Nutritional Goals: 12/5 Goal TPN rate is 85 mL/hr to provide 108 g of protein and 2158 kcals per day  RD Assessment: Estimated Needs Total Energy Estimated Needs: 2100-2300 Total Protein Estimated Needs: 95-110g Total Fluid Estimated Needs: 2.1L/day  Current Nutrition:  NPO and TPN  Plan:  At 18:00:  Restart TPN at goal rate 85 mL/hr - Decrease lipid concentration in TPN to only 19% of Kcal.  Increased Dextrose to meet nutritional goals.   Continue with no electrolytes in TPN: Na 0mEq/L, K 0mEq/L, Ca 0 mEq/L, Mg 0 mEq/L, and Phos 0mmol/L. Cl:Ac max Ac Add standard MVI and trace elements to TPN Continue q4h resistant SSI Continue mIVF, per MD Monitor TPN labs on Mon/Thurs and PRN.  BMET, mag and Phos with AM labs.  Recheck TG on Thursday.     Thank you for allowing pharmacy to be a part of this patient's care.   M , PharmD, BCPS Secure Chat if ?s 07/12/2022 10:09 AM     

## 2022-07-12 NOTE — Progress Notes (Signed)
NAME:  Cameron Rice, MRN:  [id number], DOB:  07-12-1951, LOS: 21 ADMISSION DATE:  07/01/2022, CONSULTATION DATE:  07/03/2022 REFERRING MD:  Gershon Cull NP CHIEF COMPLAINT:  Acute resp failure with shock  History of Present Illness:  71 y/o male with PMHx significant for HTN, hyperlipidemia, recurrent UTI and umbilical hernia repair (Nov 2) who presented to [facility] 11/15 with increasing abdominal distention and malaise. Patient reported 2 days of n/v as well as abdominal pain/distention since his repair. He also had not had a bowel movement since the operation. He originally was seen at [facility] where CT A/P revealed possible metastatic disease as well as SBO. He was admitted with general surgery consultation for SBO and suspected intraperitoneal carcinomatosis. He denied any blood in his vomit or stool. No fever/chills.   During this hospitalization he has been since gone back to the OR for diagnostic lap which did reveal peritoneal carcinomatosis (Oncology consulted). He has had an intraoperative paracentesis as well as a subsequent one (11/24). On 11/27, patient again appeared to have large volume ascites. Over the course of his hospitalization he has been treated for SBO with NG decompression. He was also found to have a PE without associated DVT and was started on heparin gtt 11/18. Per surgical notes, he was having some improvement with passing of gas and small BM. His NG was discontinued morning of 11/26 after having tolerated clamping trial with clears. Unfortunately 11/26 PM he required transfer to ICU after being noted to be tachycardic and hypotensive. Repeat CTA Chest 11/26 revealed no additional PE but aspiration as well as large volume ascites and a fluid filled esophagus. Upon exam by TRH, abdomen was quite distended and tympanic. NG was attempted to be placed on floor with subsequent vomiting and obvious aspiration.   Upon arrival to ICU patient remained tachycardic (140s) was given small  volume fluid resuscitation but remained hypotensive with SBP 70s, tachypneic and requiring HHFNC 100% and 40L. He states he is feeling some relief with the NG placement that just occurred and already as ~278m output of dark contents. CCM was consulted for hypotension and concern for need for airway. He is able to talk at this time is oriented and alert. He does have some conversational dyspnea. He states he would want intubation if absolutely necessary. He is agreeable to pressors and would like to remain a full code.   Pertinent Medical History:  HTN Hyperlipidemia Umbilical hernia s/p repair 06/08/22 Peritoneal carcinomatosis unknown primary  Significant Hospital Events: Including procedures, antibiotic start and stop dates in addition to other pertinent events   [facility] Ex lap with frozen sections + for adenocarcinoma, awaiting final path 11/18 Dx with PE started on heparin 11/19 Negative LE dopplers 11/26 Aspiration event  11/27 Transferred to ICU, NG replaced, ongoing tachycardia/hypotension. Delirium in PM prompting CT Head ([facility], CT A/P with SBO, multiple areas of bowel thickening/peritoneal implants, increasing BLL consolidation, anasarca.  11/28 Downtrending WBC/H&H, suspect component of dilution. Hgb 7.3, 1U PRBCs given. CCS/Oncology reengaged. [facility] transfer center contacted. CT Head negative. CT abd/pelvis with SBO, multiple areas of bowel thickening/peritoneal implants, increasing BLL lung consolidation, anasarca. Confusion / delirium late evening.  12/1 paracentesis performed, 3.2L removed 12/3 Low BP, back on levophed, fever overnight. Changed to DNR. Paracentesis with 3L removed  12/4 Off pressors, vent needs 30%/PEEP 5, hyperkalemia, bicarb infusion initiated   Interim History / Subjective:  Tmax 96.9  RN reports little UOP, gastric secretions coming up  from mouth despite LCS for gastric tube UOP 161 ml in last 24 hours  On fentanyl, heparin &  TPN infusions Vent - 30%, PEEP 5  Objective:  Blood pressure (!) 89/59, pulse (!) 120, temperature (!) 96.9 F (36.1 C), temperature source Axillary, resp. rate (!) 26, height '5\' 11"'$  (1.803 m), weight 89 kg, SpO2 100 %. CVP:  [9 mmHg-12 mmHg] 11 mmHg  Vent Mode: PRVC FiO2 (%):  [30 %] 30 % Set Rate:  [18 bmp] 18 bmp Vt Set:  [600 mL] 600 mL PEEP:  [5 cmH20] 5 cmH20 Plateau Pressure:  [26 cmH20-29 cmH20] 29 cmH20   Intake/Output Summary (Last 24 hours) at 07/12/2022 6599 Last data filed at 07/12/2022 0700 Gross per 24 hour  Intake 3516.74 ml  Output 1047 ml  Net 2469.74 ml   Filed Weights   07/06/22 0515 07/11/22 0442 07/12/22 0500  Weight: 81.2 kg 90.1 kg 89 kg   Exam: General: critically ill appearing adult male lying in bed on vent in NAD  HEENT: MM pink/moist, ETT, temporal wasting, anicteric, pupils =/reactive Neuro: awake, less alert than 12/5 exam but interactive CV: s1s2 RRR, ST on monitor, no m/r/g PULM: non-labored at rest, lungs bilaterally with vent assisted breath sounds GI: taunt, protuberant, lap sites c/d/i Extremities: warm/dry, BLE 1+ edema  Skin: no rashes or lesions GU: foley, scant urine   Resolved Hospital Problem List:  Lactic Acidosis Acute Renal Failure secondary to ATN, prerenal AKI Relative Adrenal Insufficiency  Assessment & Plan:   Septic Shock secondary to Aspiration, with E.Coli PNA  Febrile state and uptrending WBC  In setting of stress dose steroids, sepsis along with malignancy. Continues to spike fevers. Stress dose steroids stopped 12/1.  -ICU monitoring  -monitor off vasopressors  -continue meropenem -micafungin D9 -tele monitoring  -follow off pressors   Acute Hypoxic Respiratory Failure Acute PE / DVT Aspiration Pneumonia Echo 11/27 with LVEF 60-65%, mildly D-shaped septum c/f RV pressure/volume overload. -PRVC with LTVV -wean PEEP / fiO2 for sats >90% -heparin infusion per pharmacy  -PAD protocol with fentanyl -RASS  goal 0 to -1  -failed SBT am 12/6 with tachypnea, abdominal compliance impeding vent weaning  Peritoneal Carcinomatosis Ascites, malignant in nature Bacterial peritonitis SBO Path/cyto with moderately differentiated adenocarcinoma. CT A/P 11/27 demonstrating SBO, multiple areas of bowel thickening/peritoneal implants, increasing BLL consolidation, anasarca. EGD on 11/29 with areas of ulceration. Gastric mucosal biopsies obtained on EGD 07/01/2022 are unrevealing for primary source of peritoneal carcinomatosis  -appreciate ONC evaluation > rec's for palliative care noted  -NPO  -continue TPN per pharmacy  -NGT to LCWS  -assess abd with [place] for therapeutic paracentesis   Hyperkalemia AKI  -TPN per pharmacy  -reviewed progressive renal failure with family > will attempt lasix, albumin   -Trend BMP / urinary output -Replace electrolytes as indicated -Avoid nephrotoxic agents, ensure adequate renal perfusion  Hypertriglyceridemia  On TPN, propofol. Sedation changed 11/29. On prior agents at home.  -TPN per pharmacy   Anemia, multifactorial Hgb drift 11/27-11/28, CT Head/CT A/P without e/o active bleeding. NGT output darker. -Follow CBC trend  -BID PPI  -transfuse for Hgb <7%  GOC Ongoing GOC discussion necessary in the setting of poor prognosis with widespread peritoneal carcinomatosis (moderately differentiated adeno with unknown primary). Per UpToDate, 1 year survival is 17% in these cases.  -discussed with family 12/6 progressive renal failure. Called [facility] for transfer again this am and there are no available beds.  Given progression of malignant burden, suspect Rodman Key  is nearing end of life despite aggressive interventions.  Family requests additional lasix, albumin and paracentesis.  Repeat labs for am.  They ask about possibility of him coming off mechanical ventilation to allow conversation.  We reviewed his failure of PSV attempt this a.m. and that I suspect that he would  likely have a large symptom burden in terms of work of breathing and that he would need medications for comfort.  We reviewed the utility of hemodialysis.  Unfortunately in the situation given no real options for treatment of cancer, hemodialysis would not offer a bridge to recovery.  Rather it would likely only add to potential suffering.  Family request that we regroup in the a.m. to discuss labs.  Pending review, may transition toward a more comfort focused care.  Support offered.    Best Practice: (right click and "Reselect all SmartList Selections" daily)  Diet/type: TPN DVT prophylaxis: systemic heparin  GI prophylaxis: PPI Lines: Central line Foley:  Yes, and it is still needed Code Status:  full code Last date of multidisciplinary goals of care discussion: 11/27AM with patient.  Wife and daughter updated 12/6.  See above details.  Critical care time: 15 minutes    Noe Gens, MSN, APRN, NP-C, AGACNP-BC [facility] 07/12/2022, 8:33 AM   Please see [email or web address] for pager details.   From 7A-7P if no response, please call [phone] After hours, please call ELink [phone]

## 2022-07-13 DIAGNOSIS — R4589 Other symptoms and signs involving emotional state: Secondary | ICD-10-CM | POA: Diagnosis not present

## 2022-07-13 DIAGNOSIS — Z515 Encounter for palliative care: Secondary | ICD-10-CM | POA: Diagnosis not present

## 2022-07-13 DIAGNOSIS — Z7189 Other specified counseling: Secondary | ICD-10-CM

## 2022-07-13 DIAGNOSIS — K56609 Unspecified intestinal obstruction, unspecified as to partial versus complete obstruction: Secondary | ICD-10-CM | POA: Diagnosis not present

## 2022-07-13 DIAGNOSIS — N179 Acute kidney failure, unspecified: Secondary | ICD-10-CM | POA: Diagnosis not present

## 2022-07-13 DIAGNOSIS — R748 Abnormal levels of other serum enzymes: Secondary | ICD-10-CM | POA: Diagnosis not present

## 2022-07-13 DIAGNOSIS — J9601 Acute respiratory failure with hypoxia: Secondary | ICD-10-CM | POA: Diagnosis not present

## 2022-07-13 DIAGNOSIS — C786 Secondary malignant neoplasm of retroperitoneum and peritoneum: Secondary | ICD-10-CM | POA: Diagnosis not present

## 2022-07-13 LAB — CBC
HCT: 23 % — ABNORMAL LOW (ref 39.0–52.0)
Hemoglobin: 7.5 g/dL — ABNORMAL LOW (ref 13.0–17.0)
MCH: 29.4 pg (ref 26.0–34.0)
MCHC: 32.6 g/dL (ref 30.0–36.0)
MCV: 90.2 fL (ref 80.0–100.0)
Platelets: 120 10*3/uL — ABNORMAL LOW (ref 150–400)
RBC: 2.55 MIL/uL — ABNORMAL LOW (ref 4.22–5.81)
RDW: 20.8 % — ABNORMAL HIGH (ref 11.5–15.5)
WBC: 35.9 10*3/uL — ABNORMAL HIGH (ref 4.0–10.5)
nRBC: 1 % — ABNORMAL HIGH (ref 0.0–0.2)

## 2022-07-13 LAB — COMPREHENSIVE METABOLIC PANEL
ALT: 49 U/L — ABNORMAL HIGH (ref 0–44)
AST: 66 U/L — ABNORMAL HIGH (ref 15–41)
Albumin: 2.9 g/dL — ABNORMAL LOW (ref 3.5–5.0)
Alkaline Phosphatase: 124 U/L (ref 38–126)
Anion gap: 15 (ref 5–15)
BUN: 135 mg/dL — ABNORMAL HIGH (ref 8–23)
CO2: 17 mmol/L — ABNORMAL LOW (ref 22–32)
Calcium: 8.1 mg/dL — ABNORMAL LOW (ref 8.9–10.3)
Chloride: 101 mmol/L (ref 98–111)
Creatinine, Ser: 4.39 mg/dL — ABNORMAL HIGH (ref 0.61–1.24)
GFR, Estimated: 14 mL/min — ABNORMAL LOW (ref >60)
Glucose, Bld: 115 mg/dL — ABNORMAL HIGH (ref 70–99)
Potassium: 5.2 mmol/L — ABNORMAL HIGH (ref 3.5–5.1)
Sodium: 133 mmol/L — ABNORMAL LOW (ref 135–145)
Total Bilirubin: 2.8 mg/dL — ABNORMAL HIGH (ref 0.3–1.2)
Total Protein: 6.3 g/dL — ABNORMAL LOW (ref 6.5–8.1)

## 2022-07-13 LAB — GLUCOSE, CAPILLARY
Glucose-Capillary: 112 mg/dL — ABNORMAL HIGH (ref 70–99)
Glucose-Capillary: 118 mg/dL — ABNORMAL HIGH (ref 70–99)
Glucose-Capillary: 120 mg/dL — ABNORMAL HIGH (ref 70–99)
Glucose-Capillary: 121 mg/dL — ABNORMAL HIGH (ref 70–99)
Glucose-Capillary: 125 mg/dL — ABNORMAL HIGH (ref 70–99)
Glucose-Capillary: 130 mg/dL — ABNORMAL HIGH (ref 70–99)

## 2022-07-13 LAB — MAGNESIUM: Magnesium: 2.2 mg/dL (ref 1.7–2.4)

## 2022-07-13 LAB — HEPARIN LEVEL (UNFRACTIONATED): Heparin Unfractionated: 0.31 IU/mL (ref 0.30–0.70)

## 2022-07-13 LAB — PHOSPHORUS: Phosphorus: 6.4 mg/dL — ABNORMAL HIGH (ref 2.5–4.6)

## 2022-07-13 LAB — ASPERGILLUS ANTIGEN, BAL/SERUM: Aspergillus Ag, BAL/Serum: 0.15 Index (ref 0.00–0.49)

## 2022-07-13 LAB — TRIGLYCERIDES: Triglycerides: 195 mg/dL — ABNORMAL HIGH (ref <150)

## 2022-07-13 MED ORDER — TRAVASOL 10 % IV SOLN
INTRAVENOUS | Status: DC
  Filled 2022-07-13: qty 1081.2

## 2022-07-13 NOTE — Progress Notes (Addendum)
NAME:  Cameron Rice, MRN:  [id number], DOB:  [birth year], LOS: 22 ADMISSION DATE:  06/20/2022, CONSULTATION DATE:  07/03/2022 REFERRING MD:  Gershon Cull NP CHIEF COMPLAINT:  Acute resp failure with shock  History of Present Illness:  71 y/o male with PMHx significant for HTN, hyperlipidemia, recurrent UTI and umbilical hernia repair (Nov 2) who presented to [facility] 11/15 with increasing abdominal distention and malaise. Patient reported 2 days of n/v as well as abdominal pain/distention since his repair. He also had not had a bowel movement since the operation. He originally was seen at [facility] where CT A/P revealed possible metastatic disease as well as SBO. He was admitted with general surgery consultation for SBO and suspected intraperitoneal carcinomatosis. He denied any blood in his vomit or stool. No fever/chills.   During this hospitalization he has been since gone back to the OR for diagnostic lap which did reveal peritoneal carcinomatosis (Oncology consulted). He has had an intraoperative paracentesis as well as a subsequent one (11/24). On 11/27, patient again appeared to have large volume ascites. Over the course of his hospitalization he has been treated for SBO with NG decompression. He was also found to have a PE without associated DVT and was started on heparin gtt 11/18. Per surgical notes, he was having some improvement with passing of gas and small BM. His NG was discontinued morning of 11/26 after having tolerated clamping trial with clears. Unfortunately 11/26 PM he required transfer to ICU after being noted to be tachycardic and hypotensive. Repeat CTA Chest 11/26 revealed no additional PE but aspiration as well as large volume ascites and a fluid filled esophagus. Upon exam by TRH, abdomen was quite distended and tympanic. NG was attempted to be placed on floor with subsequent vomiting and obvious aspiration.   Upon arrival to ICU patient remained tachycardic (140s) was given small  volume fluid resuscitation but remained hypotensive with SBP 70s, tachypneic and requiring HHFNC 100% and 40L. He states he is feeling some relief with the NG placement that just occurred and already as ~248m output of dark contents. CCM was consulted for hypotension and concern for need for airway. He is able to talk at this time is oriented and alert. He does have some conversational dyspnea. He states he would want intubation if absolutely necessary. He is agreeable to pressors and would like to remain a full code.   Pertinent Medical History:  HTN Hyperlipidemia Umbilical hernia s/p repair 06/08/22 Peritoneal carcinomatosis unknown primary  Significant Hospital Events: Including procedures, antibiotic start and stop dates in addition to other pertinent events   [facility] Ex lap with frozen sections + for adenocarcinoma, awaiting final path 11/18 Dx with PE started on heparin 11/19 Negative LE dopplers 11/26 Aspiration event  11/27 Transferred to ICU, NG replaced, ongoing tachycardia/hypotension. Delirium in PM prompting CT Head ([facility], CT A/P with SBO, multiple areas of bowel thickening/peritoneal implants, increasing BLL consolidation, anasarca.  11/28 intubated, downtrending WBC/H&H, suspect component of dilution. Hgb 7.3, 1U PRBCs given. CCS/Oncology reengaged. [facility] transfer center contacted. CT Head negative. CT abd/pelvis with SBO, multiple areas of bowel thickening/peritoneal implants, increasing BLL lung consolidation, anasarca. Confusion / delirium late evening.  12/1 paracentesis performed, 3.2L removed 12/3 Low BP, back on levophed, fever overnight. Changed to DNR. Paracentesis with 3L removed  12/4 Off pressors, vent needs 30%/PEEP 5, hyperkalemia, bicarb infusion initiated  12/6 oliguric, gastric secretions coming up from mouth despite OGT, paracentesis with 3.5L drained  Interim  History / Subjective:  More awake today, following simple  commands Remains on fentanyl 125 mcg/hr Wife and daughter at bedside Worsening renal function and UOP despite albumin/ lasix challenge and s/p paracentesis 12/6 with 3.5L removed Failed SBT 2/2 tachypnea in the 40's and increased WOB   Objective:  Blood pressure 114/81, pulse (!) 115, temperature 97.9 F (36.6 C), temperature source Axillary, resp. rate (!) 27, height '5\' 11"'$  (1.803 m), weight 89 kg, SpO2 99 %. CVP:  [8 mmHg-11 mmHg] 9 mmHg  Vent Mode: PRVC FiO2 (%):  [30 %] 30 % Set Rate:  [18 bmp] 18 bmp Vt Set:  [600 mL] 600 mL PEEP:  [5 cmH20] 5 cmH20 Plateau Pressure:  [21 cmH20-30 cmH20] 25 cmH20   Intake/Output Summary (Last 24 hours) at 07/13/2022 2706 Last data filed at 07/13/2022 2376 Gross per 24 hour  Intake 3273.74 ml  Output 514 ml  Net 2759.74 ml   Filed Weights   07/06/22 0515 07/11/22 0442 07/12/22 0500  Weight: 81.2 kg 90.1 kg 89 kg   Exam: General:  critically ill adult male lying in bed in NAD HEENT: MM pink/moist, ETT/ NGT with bilious drainage, pupils 3/reactive, mild scleral edema/ icterus, temporal wasting Neuro: eyes open, tracks some, follow some simple commands /wiggle hands/ toes CV: rr, ST, no murmur PULM:  MV supported breaths, diminished, few rhonchi LLL GI: abd distended, taunt, lap sites c/d/I, few bowel sounds, foley Extremities: warm/dry, BLE +2 up to hips, dry necrosis on right hand finger tips, LUE PICC  Skin: no rashes   UOP 196m/ 24hrs OGT 400 ml/ 24hrs  +2.5L/ 24hrs Net +24L Tmax 98.2  12/6 peritoneal fluid>   Labs> K 5.2, bicarb 17, sCr/ BUN 98/3.09> 135/ 4.39, slightly better LFTs, WBC 45.7> 35.9, Hgb 7.5, plts 120  Resolved Hospital Problem List:  Lactic Acidosis Acute Renal Failure secondary to ATN, prerenal AKI Relative Adrenal Insufficiency  Assessment & Plan:   Septic Shock secondary to Aspiration, with E.Coli PNA  Febrile state and uptrending WBC  In setting of stress dose steroids, sepsis along with malignancy.  Continues to spike fevers. Stress dose steroids stopped 12/1.  - remains off vasopressors  - cont meropenem - fungitell neg, micafungin stopped 12/6  Acute Hypoxic Respiratory Failure Acute PE / DVT Aspiration Pneumonia Echo 11/27 with LVEF 60-65%, mildly D-shaped septum c/f RV pressure/volume overload. - cont LTVV w/ PRVC with daily SBT> failed today secondary to tachypnea.  Abdominal compliance will continue to be a barrier to vent weaning - PEEP / fiO2 for sats >90% - heparin infusion per pharmacy  - PAD protocol with fentanyl gtt, prn versed - RASS goal 0 to -1  - abx as above   Peritoneal Carcinomatosis Ascites, malignant in nature Bacterial peritonitis SBO Path/cyto with moderately differentiated adenocarcinoma. CT A/P 11/27 demonstrating SBO, multiple areas of bowel thickening/peritoneal implants, increasing BLL consolidation, anasarca. EGD on 11/29 with areas of ulceration. Gastric mucosal biopsies obtained on EGD 07/01/2022 are unrevealing for primary source of peritoneal carcinomatosis  - appreciate ONC evaluation > rec's for palliative care noted  - NPO - continue TPN per pharmacy  - NGT to LCWS  - s/p therapeutic paracentesis 12/6 with 3.5L removed  Hyperkalemia AKI  - TPN per pharmacy  - worsening renal function and UOP despite albumin/ lasix and therapeutic paracentesis    - not a candidate for HD given overall poor prognosis/ irreversible disease process - cont lasix, albumin  - Trend BMP / urinary output - Replace electrolytes as  indicated - Avoid nephrotoxic agents, ensure adequate renal perfusion  Hypertriglyceridemia  On TPN, propofol. Sedation changed 11/29. On prior agents at home.  -TPN per pharmacy   Anemia, multifactorial Hgb drift 11/27-11/28, CT Head/CT A/P without e/o active bleeding. NGT output darker. - Hgb/ plt trend stable, trend CBC - BID PPI  - transfuse for Hgb <7%  GOC Ongoing GOC discussion necessary in the setting of poor prognosis  with widespread peritoneal carcinomatosis (moderately differentiated adeno with unknown primary). Per UpToDate, 1 year survival is 17% in these cases.  - 12/7, spoke with wife and daughter at bedside.  Updated on worsening renal function and UOP despite ongoing aggressive interventions.  However his mental status is better today surprisingly and he is more awake. Daughter asking about CRRT and reiterated this would not reverse his condition and likely prolong suffering.  He failed his SBT this am, likely related to ongoing abdominal process and doubt he would be able to have any meaningful conversation if extubated secondary to respiratory distress. Family needing time to discuss but recommended they make the most out of his wakefulness today and will continue current therapies.  Appreciate palliative care assistance.  Suspect we are headed towards a compassionate one-way extubation and comfort focused care as we have utilized aggressive interventions without improvement.  Ongoing emotional support offered.    Best Practice: (right click and "Reselect all SmartList Selections" daily)  Diet/type: TPN DVT prophylaxis: systemic heparin  GI prophylaxis: PPI Lines: Central line Foley:  Yes, and it is still needed Code Status:  full code Last date of multidisciplinary goals of care discussion: 11/27AM with patient.  Wife and daughter updated 12/6.  Wife and daughter updated at bedside 12/7 am.   Critical care time: 52 minutes     Kennieth Rad, MSN, AG-ACNP-BC [facility] 07/13/2022, 8:14 AM  See Amion for pager If no response to pager, please call PCCM consult pager After 7:00 pm call Elink

## 2022-07-13 NOTE — Progress Notes (Signed)
Daily Progress Note   Patient Name: Cameron Rice       Date: 07/13/2022 DOB: 07/09/1951  Age: 71 y.o. MRN#: [id number] Attending Physician: Maryjane Hurter, MD Primary Care Physician: Flossie Buffy, NP Admit Date: 07/01/2022 Length of Stay: 22 days  Reason for Consultation/Follow-up: Establishing goals of care  Subjective:   CC: Patient remains intubated though is more awake and interactive today.  Following up with family regarding complex medical decision making.  Subjective:  Reviewed EMR prior to seeing patient. Renal function has worsened renal function worsening with creatinine rising to 4.39.  Potassium now 5.2. Discussed care with primary PCCM team and bedside RN prior to seeing patient this morning.  Patient again failed SBT this morning.  When presenting to bedside, wife is present.  Daughter had already left to go home to freshen up since she was here overnight.  Patient laying in bed; remains intubated.  Patient more awake and interactive today.  Sat down and spoke with wife regarding medical updates.  She has heard from the ICU team that patient's kidney function is worse.  She admits this is not news they wanted to hear this morning.  She shared that the plan is to currently continue with Lasix and albumin to see if patient's kidney function will improve.  Spent time providing emotional support via active listening.  Inquired about family coming to visit.  She noted that the fifth grandchild who has not seen the patient, Merrily Pew, may be coming to visit today.  It is his exam weekend school and so things have been quite overwhelming.  She did mention other family members and support she and the patient have while they have been here in the hospital.  Wife did mention that their niece is a Designer, jewellery who works in Product manager here in the [place] system.  Noted this provider would be happy to talk to any family regarding medical updates and discussions so that she  does not feel that burden on her.  Wife voiced appreciation regarding this.  Noted would plan to follow-up in afternoon.  Able to see patient again in the afternoon.  Wife and daughter present at bedside along with grandson.  Wife, daughter, and this provider were able to speak privately so patient could visit with grandson.  Wife presented this provider with paperwork that patient had completed October 04, 2007.  This paperwork was advance care planning documentation that had been notarized and on for while with a lawyer.  Wife noted this was present in safe at home.  Patient himself had initialed along the lines that that he would want to focus on comfort care, he would not want his life prolonged in an artificial manner if it was deemed he had an incurable condition that would likely result in his death in a short period of time, and patient even initialed that his healthcare power of attorney would not have the power to reverse his decisions on this matter.  With reviewing this document, we discussed how patient himself elected that this would not be quality of life to him and he would not want to be kept alive in this manner.  Noted that though it does not make the situation and the easier, they should acknowledge patient's wishes for his care and respect that he wanted to be comfortable at the end of life.  We discussed that more family is coming in tonight to say goodbye.  Planning to meet with wife and daughter at  bedside tomorrow at 11 AM to transition to comfort focused care as per patient's own wishes.  Family wished for patient to proceed with lab work in the a.m.  Thoroughly explained that obtaining this lab work would not change the patient's underlying condition or his wishes and medical care though would knowledged it would bring the family peace to have this information.  Explained that level of care will not be escalated overnight and so if patient should pass away before meeting at 11 AM  tomorrow, would still focus on his comfort.  Spent time providing emotional support.    We discussed process of dying again and family mentioned that they have actually seen patient showing signs that his time is near.  Including patient trying to get up out of bed saying he needs to go somewhere.  Wife explained her brother did the same thing when he died from cancer in 10/23/17.  Provided family with copies of "Gone From My Sight" for further detail review at their request.  All questions answered at that time.  Thanked family for allowing me to meet with him today.  Updated bedside RN and primary PCCM team regarding plans for medical care and plan for transition to comfort focused care tomorrow morning meeting with the family at 61 AM.  Review of Systems  Objective:   Vital Signs:  BP 114/81   Pulse (!) 115   Temp 97.9 F (36.6 C) (Axillary)   Resp (!) 27   Ht '5\' 11"'$  (1.803 m)   Wt 89 kg   SpO2 99%   BMI 27.37 kg/m   Physical Exam: General: intubated, awake, frail, critically ill appearing HENT: ETT in place Cardiovascular: tachycardia noted Respiratory: intubated on ventilator support Abdomen: distended Skin: right hand discoloration Neuro: Awake and able to lift hand at times questioning  Imaging: I personally reviewed recent imaging.   Assessment & Plan:   Assessment: Patient is a 71yo M with a PMHx of HTN, HLD, recurrent UTIs, BPH, and umbilical hernia repair who was admitted on 06/10/2022 for management of abdominal distention. During prolonged hospitalization, patient has been found to have peritoneal carcinomatosis with moderately differentiated adenocarcinoma, SBO, bacterial peritonitis, required TPN, septic shock 2/2 aspiration with E.Coli PNA with acute hypoxic respiratory failure requiring intubation, and AKI. PMT following to assist with complex medical decision making.  Recommendations/Plan: # Complex medical decision making/goals of care:  - Patient intubated on  ventilator support.  Patient more awake today and able to lift hand to questioning at times.                -Extensive meeting with family members today as described above in HPI.  After multiple conversations wife was able to provide ACP documentation (notarized and completed with lawyer's office) that patient had completed February 2009 that covered the gist that he would not want his life prolonged and an artificial manner if his prognosis was short and he was likely to die within a short period of time and had an incurable disease.  It also stated in the document the patient would not want his healthcare power of attorney to reverse his own stated medical wishes for him to be allowed to have a comfortable natural death.  Based on this new information, planning to meet with family at 27 AM tomorrow to transition to comfort focused care.  Discussed the idea of a palliative extubation again and medications that would be given for patient's comfort.  More family coming in tonight and  tomorrow morning to visit with patient prior to transition to comfort focused care.  -Should patient continue to further decompensate overnight, level of care should not be escalated as per patient's own wishes from ACP document patient completed in 2009.  Family should be informed of deterioration and patient should be allowed to be comfortable at the end of life.  -  Code Status: DNR  # Symptom management:   -As per primary team    # Psychosocial Support:  -- Support System: wife, daughter, 5 grandchildren   - Appreciate chaplain's continued support.  # Discharge Planning: Plan is for patient to transition to comfort focused care tomorrow when meeting with family at 34 AM.  Will likely die in hospital; informed family of this.   Discussed with: bedside RN, primary team, family  Thank you for allowing the palliative care team to participate in the care Cameron Rice.  Chelsea Aus, DO Palliative Care Provider PMT  # [phone]  If patient remains symptomatic despite maximum doses, please call PMT at [phone] between 0700 and 1900. Outside of these hours, please call attending, as PMT does not have night coverage.  This provider spent a total of 95 minutes providing patient's care.  Includes review of EMR, discussing care with other staff members involved in patient's medical care, obtaining relevant history and information from patient and/or patient's family, and personal review of imaging and lab work. Greater than 50% of the time was spent counseling and coordinating care related to the above assessment and plan.

## 2022-07-13 NOTE — Progress Notes (Signed)
ANTICOAGULATION CONSULT NOTE - Follow Up Consult  Pharmacy Consult for Heparin Indication: pulmonary embolus  No Known Allergies  Patient Measurements: Height: '5\' 11"'$  (180.3 cm) Weight: 89 kg (196 lb 3.4 oz) IBW/kg (Calculated) : 75.3 Heparin Dosing Weight: 81 kg  Vital Signs: Temp: 97.9 F (36.6 C) (12/07 0400) Temp Source: Axillary (12/07 0400) BP: 114/81 (12/07 0600) Pulse Rate: 115 (12/07 0600)  Labs: Recent Labs    07/11/22 0447 07/11/22 1531 07/12/22 0438 07/13/22 0500 07/13/22 0530  HGB 8.2*  --  7.9* 7.5*  --   HCT 25.9*  --  25.2* 23.0*  --   PLT 113*  --  118* 120*  --   HEPARINUNFRC 0.47  --  0.49  --  0.31  CREATININE 1.85*  --  3.09* 4.39*  --   TROPONINIHS  --  30*  --   --   --      Estimated Creatinine Clearance: 16.9 mL/min (A) (by C-G formula based on SCr of 4.39 mg/dL (H)).   Medications:  Infusions:   sodium chloride Stopped (07/06/22 2006)   sodium chloride 10 mL/hr at 07/13/22 0726   fentaNYL infusion INTRAVENOUS 125 mcg/hr (07/13/22 0726)   heparin 1,700 Units/hr (07/13/22 0726)   meropenem (MERREM) IV Stopped (07/12/22 2219)   norepinephrine (LEVOPHED) Adult infusion Stopped (07/08/22 0241)   TPN ADULT (ION) 85 mL/hr at 07/13/22 0726    Assessment: 63 yoM admitted on 32/99/2426 with complications from widespread intraperitoneal malignancy.  Pharmacy has been consulted to dose Heparin for new PE on CT (and suspicious for right common iliac DVT ). Dopplers negative for DVT.  Significant events: - 11/28: hold for maroon NGO & Hg drop to 7.3 - 11/29 EGD: esophagitis, no bleeding; non-bleeding gastric ulcers - 11/30 resume UFH, no bolus  Today, 07/13/2022: Heparin level 0.31, continues to be therapeutic on heparin 1700 units/hr CBC:  Hgb decreased to 7.5, Plt 120 No bleeding or complications reported (s/p paracentesis 12/6) SCr significantly increasing  Goal of Therapy:  Heparin level 0.3-0.7 units/ml Monitor platelets by  anticoagulation protocol: Yes   Plan:  No bolus Continue heparin IV infusion at 1700 units/hr Daily heparin level and CBC Continue to monitor H&H and platelets   Gretta Arab PharmD, BCPS [facility] main pharmacy [phone] 07/13/2022 7:37 AM

## 2022-07-13 NOTE — Progress Notes (Signed)
PHARMACY - TOTAL PARENTERAL NUTRITION CONSULT NOTE   Indication: SBO with Prolonged ileus  Patient Measurements: Height: _0  (180.3 cm) Weight: 89 kg (196 lb 3.4 oz) IBW/kg (Calculated) : 75.3 TPN AdjBW (KG): 76.2 Body mass index is 27.37 kg/m. Usual Weight:    Assessment:  Post operative abdominal pain with ascites after umbilical hernia repair with mesh on 06/08/22.   On 11/16 he had Lap biopsy and ascites aspiration.  Now has ileus with SBO, and suspected intraperitoneal carcinomatosis   Glucose / Insulin: No h/o DM noted. A1C 4.8.  CBGs / 24hrs at goal < 150 - resistant SSI used: 9 units last 24h - hydrocortisone 11/28 >> 12/2 Electrolytes: Na 133, K remains elevated at 5.2, Phos elevated at 6.4, CorrCa ~9, CO2 low - Goal K>=4 and Mg>=2 with ileus - TPN held on 12/4 at 9:30 am. TPN resumed without electrolytes on 12/4 at 1800, but stopped on 12/5 at ~0630 due to IV line problem, resume 12/5 PM without elytes. Renal: SCr increasing 0.9 >>> 4.39, BUN up to 135.  Not a CRRT candidate Hepatic: AST/ALT, Tbili elevated (12/7), Alk phos WNL - Triglycerides peaked at 977 (11/30).  Propofol changed to precedex on 11/29, Lipids removed from TPN on 11/30. - 12/4 TG improved significantly to 181 so lipids resumed in TPN. - 12/5 TG increased to to 295 after only ~ 11 hours of TPN with lipids.   - 12/7 TG decreased to 195 Intake / Output; MIVF:  - Output: NG 400 mls/24 hrs, UOP only 120 mls/24hr, LBM 11/28 - Lasix:  11/29, 11/30, 12/1 x2 doses, 12/5 x2 doses, 12/6 x 1 (with albumin) - MIVF: KVO GI Imaging: 11/15: CT: Mildly dilated loops of small bowel in the left lower quadrant with scattered air-fluid levels, nonspecific for ileus versus early obstruction 11/17: Xray: ileus vs obstruction 11/18: CT:  peritoneal carcinomatosis,  partial bowel obstruction/ileus 11/27 CT: SBO, extensive peritoneal & omental carcinomatosis w/ free ascites (less than prev) GI Surgeries / Procedures:  11/2  hernia repair 11/16 diagnostic laparoscopy 11/29 EGD: esophagitis, no bleeding; non-bleeding gastric ulcers  Central access: PICC 11/21 TPN start date: 11/21  Nutritional Goals: 12/5 Goal TPN rate is 85 mL/hr to provide 108 g of protein and 2158 kcals per day  RD Assessment: Estimated Needs Total Energy Estimated Needs: 2100-2300 Total Protein Estimated Needs: 95-110g Total Fluid Estimated Needs: 2.1L/day  Current Nutrition:  NPO and TPN  Plan:  At 18:00:  Continue TPN at goal rate 85 mL/hr - Continue lower lipid concentration in TPN (19% of Kcal) with increased Dextrose to meet nutritional goals.   Electrolytes in TPN: Na 9mq/L, K 03m/L, Ca 0 mEq/L, Mg 0 mEq/L, and Phos 93m43m/L. Cl:Ac max Ac Add standard MVI and trace elements to TPN Continue q4h resistant SSI Continue mIVF, per MD Monitor TPN labs on Mon/Thurs and PRN.   Thank you for allowing pharmacy to be a part of this patient's care.  ChrGretta ArabarmD, BCPS [facility] main pharmacy [phone]/02/2022 7:54 AM

## 2022-07-14 DIAGNOSIS — K56609 Unspecified intestinal obstruction, unspecified as to partial versus complete obstruction: Secondary | ICD-10-CM | POA: Diagnosis not present

## 2022-07-14 DIAGNOSIS — C786 Secondary malignant neoplasm of retroperitoneum and peritoneum: Secondary | ICD-10-CM | POA: Diagnosis not present

## 2022-07-14 DIAGNOSIS — Z515 Encounter for palliative care: Secondary | ICD-10-CM | POA: Diagnosis not present

## 2022-07-14 DIAGNOSIS — R748 Abnormal levels of other serum enzymes: Secondary | ICD-10-CM | POA: Diagnosis not present

## 2022-07-14 DIAGNOSIS — K567 Ileus, unspecified: Secondary | ICD-10-CM | POA: Diagnosis not present

## 2022-07-14 DIAGNOSIS — J9601 Acute respiratory failure with hypoxia: Secondary | ICD-10-CM | POA: Diagnosis not present

## 2022-07-14 LAB — CBC
HCT: 23 % — ABNORMAL LOW (ref 39.0–52.0)
Hemoglobin: 7.4 g/dL — ABNORMAL LOW (ref 13.0–17.0)
MCH: 29.2 pg (ref 26.0–34.0)
MCHC: 32.2 g/dL (ref 30.0–36.0)
MCV: 90.9 fL (ref 80.0–100.0)
Platelets: 130 10*3/uL — ABNORMAL LOW (ref 150–400)
RBC: 2.53 MIL/uL — ABNORMAL LOW (ref 4.22–5.81)
RDW: 21 % — ABNORMAL HIGH (ref 11.5–15.5)
WBC: 34.5 10*3/uL — ABNORMAL HIGH (ref 4.0–10.5)
nRBC: 0.5 % — ABNORMAL HIGH (ref 0.0–0.2)

## 2022-07-14 LAB — BASIC METABOLIC PANEL
Anion gap: 15 (ref 5–15)
BUN: 127 mg/dL — ABNORMAL HIGH (ref 8–23)
CO2: 16 mmol/L — ABNORMAL LOW (ref 22–32)
Calcium: 7.7 mg/dL — ABNORMAL LOW (ref 8.9–10.3)
Chloride: 97 mmol/L — ABNORMAL LOW (ref 98–111)
Creatinine, Ser: 4.69 mg/dL — ABNORMAL HIGH (ref 0.61–1.24)
GFR, Estimated: 13 mL/min — ABNORMAL LOW (ref >60)
Glucose, Bld: 154 mg/dL — ABNORMAL HIGH (ref 70–99)
Potassium: 5.1 mmol/L (ref 3.5–5.1)
Sodium: 128 mmol/L — ABNORMAL LOW (ref 135–145)

## 2022-07-14 LAB — HEPARIN LEVEL (UNFRACTIONATED): Heparin Unfractionated: 0.29 IU/mL — ABNORMAL LOW (ref 0.30–0.70)

## 2022-07-14 LAB — GLUCOSE, CAPILLARY
Glucose-Capillary: 130 mg/dL — ABNORMAL HIGH (ref 70–99)
Glucose-Capillary: 122 mg/dL — ABNORMAL HIGH (ref 70–99)
Glucose-Capillary: 133 mg/dL — ABNORMAL HIGH (ref 70–99)

## 2022-07-14 MED ORDER — MORPHINE SULFATE (PF) 2 MG/ML IV SOLN
2.0000 mg | INTRAVENOUS | Status: DC | PRN
  Administered 2022-07-14: 4 mg via INTRAVENOUS
  Filled 2022-07-14: qty 2

## 2022-07-14 MED ORDER — MORPHINE SULFATE (PF) 2 MG/ML IV SOLN
INTRAVENOUS | Status: AC
  Administered 2022-07-14: 2 mg via INTRAMUSCULAR
  Filled 2022-07-14: qty 1

## 2022-07-14 MED ORDER — ATROPINE SULFATE 1 MG/10ML IJ SOSY
0.5000 mg | PREFILLED_SYRINGE | INTRAMUSCULAR | Status: DC | PRN
  Administered 2022-07-14: 0.5 mg via INTRAVENOUS
  Filled 2022-07-14: qty 10

## 2022-07-14 MED ORDER — SCOPOLAMINE 1 MG/3DAYS TD PT72
1.0000 | MEDICATED_PATCH | TRANSDERMAL | Status: DC
  Administered 2022-07-14: 1.5 mg via TRANSDERMAL
  Filled 2022-07-14: qty 1

## 2022-07-14 MED ORDER — GLYCOPYRROLATE 1 MG PO TABS: 1.0000 mg | ORAL_TABLET | ORAL | Status: DC | PRN

## 2022-07-14 MED ORDER — MIDAZOLAM-SODIUM CHLORIDE 100-0.9 MG/100ML-% IV SOLN: 0.5000 mg/h | INTRAVENOUS | Status: DC

## 2022-07-14 MED ORDER — METOCLOPRAMIDE HCL 5 MG/ML IJ SOLN
10.0000 mg | Freq: Four times a day (QID) | INTRAMUSCULAR | Status: DC | PRN
  Administered 2022-07-14: 10 mg via INTRAVENOUS
  Filled 2022-07-14: qty 2

## 2022-07-14 MED ORDER — MORPHINE SULFATE (PF) 4 MG/ML IV SOLN
INTRAVENOUS | Status: AC
  Administered 2022-07-14: 4 mg
  Filled 2022-07-14: qty 1

## 2022-07-14 MED ORDER — MORPHINE 100MG IN NS 100ML (1MG/ML) PREMIX INFUSION
0.0000 mg/h | INTRAVENOUS | Status: DC
  Administered 2022-07-14: 5 mg/h via INTRAVENOUS
  Filled 2022-07-14 (×2): qty 100

## 2022-07-14 MED ORDER — GLYCOPYRROLATE 0.2 MG/ML IJ SOLN
0.2000 mg | INTRAMUSCULAR | Status: DC | PRN
  Administered 2022-07-14: 0.2 mg via INTRAVENOUS
  Filled 2022-07-14: qty 1

## 2022-07-14 MED ORDER — ACETAMINOPHEN 650 MG RE SUPP: 650.0000 mg | Freq: Four times a day (QID) | RECTAL | Status: DC | PRN

## 2022-07-14 MED ORDER — MIDAZOLAM BOLUS VIA INFUSION (WITHDRAWAL LIFE SUSTAINING TX): 2.0000 mg | INTRAVENOUS | Status: DC | PRN

## 2022-07-14 MED ORDER — MIDAZOLAM HCL 2 MG/2ML IJ SOLN: 1.0000 mg | INTRAMUSCULAR | Status: DC | PRN

## 2022-07-14 MED ORDER — HALOPERIDOL LACTATE 5 MG/ML IJ SOLN
2.5000 mg | INTRAMUSCULAR | Status: DC | PRN
  Administered 2022-07-14: 5 mg via INTRAVENOUS
  Filled 2022-07-14: qty 1

## 2022-07-14 MED ORDER — GLYCOPYRROLATE 0.2 MG/ML IJ SOLN: 0.2000 mg | INTRAMUSCULAR | Status: DC | PRN

## 2022-07-14 MED ORDER — MIDAZOLAM-SODIUM CHLORIDE 100-0.9 MG/100ML-% IV SOLN
0.0000 mg/h | INTRAVENOUS | Status: DC
  Administered 2022-07-14: 2 mg/h via INTRAVENOUS
  Filled 2022-07-14: qty 100

## 2022-07-14 MED ORDER — SODIUM CHLORIDE 0.9 % IV SOLN: INTRAVENOUS | Status: DC

## 2022-07-14 MED ORDER — ACETAMINOPHEN 325 MG PO TABS: 650.0000 mg | ORAL_TABLET | Freq: Four times a day (QID) | ORAL | Status: DC | PRN

## 2022-07-14 MED ORDER — POLYVINYL ALCOHOL 1.4 % OP SOLN: 1.0000 [drp] | Freq: Four times a day (QID) | OPHTHALMIC | Status: DC | PRN

## 2022-07-14 MED ORDER — MORPHINE BOLUS VIA INFUSION
5.0000 mg | INTRAVENOUS | Status: DC | PRN
  Administered 2022-07-14: 5 mg via INTRAVENOUS

## 2022-07-16 LAB — BODY FLUID CULTURE W GRAM STAIN: Culture: NO GROWTH

## 2022-07-18 ENCOUNTER — Encounter (HOSPITAL_COMMUNITY): Payer: Self-pay | Admitting: Hematology

## 2022-07-19 ENCOUNTER — Encounter (HOSPITAL_COMMUNITY): Payer: Self-pay | Admitting: Hematology

## 2022-07-24 ENCOUNTER — Encounter (HOSPITAL_COMMUNITY): Payer: Self-pay | Admitting: Hematology

## 2022-07-25 ENCOUNTER — Encounter (HOSPITAL_COMMUNITY): Payer: Self-pay | Admitting: Hematology

## 2022-08-02 ENCOUNTER — Encounter (HOSPITAL_COMMUNITY): Payer: Self-pay | Admitting: Hematology

## 2022-08-07 NOTE — Progress Notes (Addendum)
NAME:  Cameron Rice, MRN:  [id number], DOB:  04-Apr-1951, LOS: 23 ADMISSION DATE:  06/26/2022, CONSULTATION DATE:  07/03/2022 REFERRING MD:  Cameron Cull NP CHIEF COMPLAINT:  Acute resp failure with shock  History of Present Illness:  72 y/o male with PMHx significant for HTN, hyperlipidemia, recurrent UTI and umbilical hernia repair (Nov 2) who presented to [facility] 11/15 with increasing abdominal distention and malaise. Patient reported 2 days of n/v as well as abdominal pain/distention since his repair. He also had not had a bowel movement since the operation. He originally was seen at [facility] where CT A/P revealed possible metastatic disease as well as Rice. He was admitted with general surgery consultation for Rice and suspected intraperitoneal carcinomatosis. He denied any blood in his vomit or stool. No fever/chills.   During this hospitalization he has been since gone back to the OR for diagnostic lap which did reveal peritoneal carcinomatosis (Cameron Rice consulted). He has had an intraoperative paracentesis as well as a subsequent one (11/24). On 11/27, patient again appeared to have large volume ascites. Over the course of his hospitalization he has been treated for Rice with NG decompression. He was also found to have a PE without associated DVT and was started on heparin gtt 11/18. Per surgical notes, he was having some improvement with passing of gas and small BM. His NG was discontinued morning of 11/26 after having tolerated clamping trial with clears. Unfortunately 11/26 PM he required transfer to ICU after being noted to be tachycardic and hypotensive. Repeat CTA Chest 11/26 revealed no additional PE but aspiration as well as large volume ascites and a fluid filled esophagus. Upon exam by Cameron Rice, abdomen was quite distended and tympanic. NG was attempted to be placed on floor with subsequent vomiting and obvious aspiration.   Upon arrival to ICU patient remained tachycardic (140s) was given small  volume fluid resuscitation but remained hypotensive with SBP 70s, tachypneic and requiring HHFNC 100% and 40L. He states he is feeling some relief with the NG placement that just occurred and already as ~237m output of dark contents. Cameron Rice was consulted for hypotension and concern for need for airway. He is able to talk at this time is oriented and alert. He does have some conversational dyspnea. He states he would want intubation if absolutely necessary. He is agreeable to pressors and would like to remain a full code.   Pertinent Medical History:  HTN Hyperlipidemia Umbilical hernia s/p repair 06/08/22 Peritoneal carcinomatosis unknown primary  Significant Hospital Events: Including procedures, antibiotic start and stop dates in addition to other pertinent events   11/15 Admitted to Cameron Rice 11/16 Ex lap with frozen sections + for adenocarcinoma, awaiting final path 11/18 Dx with PE started on heparin 11/19 Negative LE dopplers 11/26 Aspiration event  11/27 Transferred to ICU, NG replaced, ongoing tachycardia/hypotension. Delirium in PM prompting CT Head (Cameron Rice, CT A/P with Rice, multiple areas of bowel thickening/peritoneal implants, increasing BLL consolidation, anasarca.  11/28 intubated, downtrending WBC/H&H, suspect component of dilution. Hgb 7.3, 1U PRBCs given. Cameron Rice/Cameron Rice reengaged. [facility] transfer Rice contacted. CT Head negative. CT abd/pelvis with Rice, multiple areas of bowel thickening/peritoneal implants, increasing BLL lung consolidation, anasarca. Confusion / delirium late evening.  12/1 paracentesis performed, 3.2L removed 12/3 Low BP, back on levophed, fever overnight. Changed to DNR. Paracentesis with 3L removed  12/4 Off pressors, vent needs 30%/PEEP 5, hyperkalemia, bicarb infusion initiated  12/6 oliguric, gastric secretions coming up from mouth despite OGT, paracentesis with 3.5L drained 12/8 Remains  on Fentanyl, relatively comfortable. Awake and nodding appropriately  to questions, following commands. Ongoing family GOC discussion. Cameron Rice 1100.  Interim History / Subjective:  No significant events overnight; per family uneventful Remains on Fentanyl gtt, appears relatively comfortable Nodding appropriately to questions but will note affect seems less bright than prior Following commands Ongoing Cameron Rice discussions with Cameron Rice/family Rice with Cameron Rice today to discuss Cameron Rice's wishes and next steps  Objective:  Blood pressure 100/68, pulse (!) 113, temperature 97.7 F (36.5 C), temperature source Axillary, resp. rate (!) 24, height '5\' 11"'$  (1.803 m), weight 87.8 kg, SpO2 100 %. CVP:  [6 mmHg-13 mmHg] 7 mmHg  Vent Mode: PRVC FiO2 (%):  [30 %] 30 % Set Rate:  [18 bmp] 18 bmp Vt Set:  [600 mL] 600 mL PEEP:  [5 cmH20] 5 cmH20 Plateau Pressure:  [25 cmH20-30 cmH20] 25 cmH20   Intake/Output Summary (Last 24 hours) at 07/22/2022 0803 Last data filed at 2022-07-22 9937 Gross per 24 hour  Intake 3032.09 ml  Output 1400 ml  Net 1632.09 ml    Filed Weights   07/11/22 0442 07/12/22 0500 07/22/2022 0500  Weight: 90.1 kg 89 kg 87.8 kg   Physical Examination: General: Acutely ill-appearing middle-aged man in NAD. Appears mildly uncomfortable. HEENT: Cameron Rice/AT, anicteric sclera, PERRL, moist mucous membranes. ETT/GT in place. +Temporal wasting Neuro:  Awake, nodding appropriately to questions. Affect less bright.  Responds to verbal stimuli. Following commands consistently. Moves all 4 extremities spontaneously. Generalized weakness throughout. CV: Mild tachycardia to 110s, no m/g/r. PULM: Breathing even and mildly labored on vent (PEEP 5, FiO2 30%). Lung fields clear in upper fields, diminished at bilateral bases; some splinting/decreased volumes due to abdominal distention. GI: Taut/firm, moderate to severe abdominal distention. Dull to percussion. Hypoactive bowel sounds. Extremities: Bilateral symmetric 2+ pitting LE edema noted. RUE fingertips dark with dry  necrosis. LUE PICC present. Skin: Warm/dry, no rashes.  Resolved Hospital Problem List:  Lactic Acidosis Acute Renal Failure secondary to ATN, prerenal AKI Relative Adrenal Insufficiency  Assessment & Plan:   Septic Shock secondary to Aspiration, with E.Coli PNA  Febrile state and uptrending WBC  In setting of stress dose steroids, sepsis along with malignancy. Continues to spike fevers. Stress dose steroids stopped 12/1. Fungitell negative. - Goal MAP > 65 - Remains off of vasopressors - Trend WBC, fever curve - F/u Cx data - Continue meropenem - Micafungin discontinued 12/6  Acute Hypoxic Respiratory Failure Acute PE / DVT Aspiration Pneumonia Echo 11/27 with LVEF 60-65%, mildly D-shaped septum c/f RV pressure/volume overload. - Continue full vent support (4-8cc/kg IBW) - Wean FiO2 for O2 sat > 90% - Daily WUA/SBT; failed 12/7 in the setting of tachypnea to 40s/increased WOB; no plan to wean today 12/8, will allow patient to rest - VAP bundle - Pulmonary hygiene - PAD protocol for sedation: Fentanyl for goal RASS 0 to -1 - Heparin gtt per pharmacy  Peritoneal Carcinomatosis Ascites, malignant in nature Bacterial peritonitis Rice Path/cyto with moderately differentiated adenocarcinoma. CT A/P 11/27 demonstrating Rice, multiple areas of bowel thickening/peritoneal implants, increasing BLL consolidation, anasarca. EGD on 11/29 with areas of ulceration. Gastric mucosal biopsies obtained on EGD 06/13/2022 are unrevealing for primary source of peritoneal carcinomatosis. Multiple attempts made to transfer patient to [facility], however patient has remained too unstable for safe transfer. - Cameron Rice consulted, evaluated patient - NPO - TPN per Pharmacy - NGT to LCWS - Last para 12/6 with 3.5L removed - Cameron Rice today, as further treatment options have been exhausted  Hyperkalemia AKI, likely ATN in the setting of shock, sepsis Worsening renal function and UOP despite albumin/  lasix and therapeutic paracentesis. - Not a candidate for HD, given overall poor prognosis/irreversible disease process - Lasix, Albumin - Trend BMP - Replete electrolytes as indicated - Monitor I&Os - Avoid nephrotoxic agents as able - Ensure adequate renal perfusion  Hypertriglyceridemia  On TPN, propofol. Sedation changed 11/29. On prior agents at home.  - TPN per Pharmacy, adjust lipids as needed  Anemia, multifactorial Hgb drift 11/27-11/28, CT Head/CT A/P without e/o active bleeding. NGT output darker. - Trend H&H - Monitor for signs of active bleeding - Transfuse for Hgb < 7.0 or hemodynamically significant bleeding  GOC Ongoing GOC discussion necessary in the setting of poor prognosis with widespread peritoneal carcinomatosis (moderately differentiated adeno with unknown primary). Per UpToDate, 1 year survival is 17% in these cases. - Cameron Rice consulted, appreciate recommendations and involvement in care - Family Rice occurred today, 12/8 with patient/family at bedside. Fortunately, Khoa was lucid and able to participate in the Rice despite his MV status. After discussion with Cameron Rice and family, he confirmed that, given the irreversible nature of his disease, his wishes at this point are to be comfortable. - Comfort care transition orders placed, will initiate when patient's family has arrived - DNR code status - Anticipate in-hospital death  Best Practice: (right click and "Reselect all SmartList Selections" daily)  Diet/type: TPN DVT prophylaxis: systemic heparin  GI prophylaxis: PPI Lines: Central line Foley:  Yes, and it is still needed Code Status:  full code Last date of multidisciplinary goals of care discussion: Cameron Rice with patient/family 12/8 1100 with plan for transition to comfort measures per patient's wishes.  Critical care time: 39 minutes    Lestine Mount, [facility] 18-Jul-2022 11:37 AM  Please see [email or web address] for  pager details.  From 7A-7P if no response, please call [phone] After hours, please call ELink [phone]

## 2022-08-07 NOTE — Progress Notes (Signed)
Daily Progress Note   Patient Name: Cameron Rice       Date: 2022/07/26 DOB: 26-Sep-1950  Age: 72 y.o. MRN#: [id number] Attending Physician: Maryjane Hurter, MD Primary Care Physician: Flossie Buffy, NP Admit Date: 06/17/2022 Length of Stay: 23 days  Reason for Consultation/Follow-up: Establishing goals of care  Subjective:   CC: Patient awake and interactive on ventilator today.  Following up regarding complex medical decision making.  Subjective:  Able to speak to family privately without family present at bedside.  Patient is very awake and interactive despite being on the ventilator.  Being head yes and no to answer questions.  Patient agrees that he completed documentation stating he would not want to be kept alive in this condition.  Patient agreeing that he would want ventilator tube removed.  Patient agreeing he would want to be comfortable at the end of his life and not suffer at this way anymore.  Able to meet with family as planned at 27 AM.  Multiple family members present including patient's wife, daughter, nieces, sister, and nephew.  Introduced myself and my role as a member of the palliative care team in [place] care.  Discussed the seriousness of patient's medical situation.  Explained that patient completed documentation previously stating he would want to be made comfortable and not live on life support in this condition.  Family acknowledging this and wanting to proceed with following and respecting patient's wishes for a peaceful death. Informed family had informed Cameron Rice about this process and they were voiced appreciation for that so they did not need to.  Discussed process of palliative extubation and how management of medications would occur.  Spent time answering questions regarding this.  Awaiting a couple more family members to present and then will provide medications for patient's comfort and proceed with liberation from the ventilator.  Did express  time will likely be short due to patient's serious medical illness and could potentially be as short as minutes to hours.  Family acknowledged this and noted that primary concern is that patient "not suffer".  Time offering emotional support as able.  All questions answered at that time.  Thank them for allowing to me to meet with all of them today.  Review of Systems Patient admitted discomfort from breathing tube.  Objective:   Vital Signs:  BP 105/62   Pulse (!) 117   Temp 98.6 F (37 C) (Oral)   Resp (!) 26   Ht '5\' 11"'$  (1.803 m)   Wt 87.8 kg   SpO2 99%   BMI 27.00 kg/m   Physical Exam: General: intubated, awake, directive, frail, critically ill appearing HENT: ETT in place Cardiovascular: tachycardia noted Respiratory: intubated on ventilator support Abdomen: distended Skin: right hand discoloration Neuro: Awake and interactive, appropriately nodding head yes and no  Imaging: I personally reviewed recent imaging.   Assessment & Plan:   Assessment: Patient is a 72yo M with a PMHx of HTN, HLD, recurrent UTIs, BPH, and umbilical hernia repair who was admitted on 06/15/2022 for management of abdominal distention. During prolonged hospitalization, patient has been found to have peritoneal carcinomatosis with moderately differentiated adenocarcinoma, SBO, bacterial peritonitis, required TPN, septic shock 2/2 aspiration with E.Coli PNA with acute hypoxic respiratory failure requiring intubation, and AKI. PMT following to assist with complex medical decision making.  Recommendations/Plan: # Complex medical decision making/goals of care:  - Patient intubated on ventilator support.  Patient appropriately nodding head yes and no to answer questions when seen.  Patient agreeing that he had previously stated he would not want to be kept alive in this condition.  Patient would like to be liberated from the ventilator and be allowed to focus on his comfort at the end of life.                 -Able to meet with multiple family members today including patient's wife and daughter as stated above in HPI.  Patient himself had previously stated he would not want to be kept alive in this condition.  Family acknowledging this.  Discussed process of palliative extubation and focusing on patient's comfort at the end of life.  Will proceed with palliative extubation once all family members present who want to say goodbye to patient.  -  Code Status: DNR  # Symptom management:   -Discussed with primary team addition of continuous Versed infusion for symptom management associated with palliative extubation.  Patient also receiving IV fentanyl infusion and will have boluses available if needed.  Family's primary concern is that patient not suffer at the end of his life.  # Psychosocial Support:  -- Support System: Discussed care with multiple family members today including wife and daughter  # Discharge Planning: Plan is for patient to transition to comfort focused care tomorrow when meeting with family at 54 AM.  Will likely die in hospital; informed family of this.   Discussed with: bedside RN, primary team, family  Thank you for allowing the palliative care team to participate in the care Cameron Rice.  Chelsea Aus, DO Palliative Care Provider PMT # [phone]  If patient remains symptomatic despite maximum doses, please call PMT at [phone] between 0700 and 1900. Outside of these hours, please call attending, as PMT does not have night coverage.  This provider spent a total of 65 minutes providing patient's care.  Includes review of EMR, discussing care with other staff members involved in patient's medical care, obtaining relevant history and information from patient and/or patient's family, and personal review of imaging and lab work. Greater than 50% of the time was spent counseling and coordinating care related to the above assessment and plan.

## 2022-08-07 NOTE — Plan of Care (Signed)
  Problem: Pain Managment: Goal: General experience of comfort will improve Outcome: Progressing   Problem: Nutritional: Goal: Maintenance of adequate nutrition will improve Outcome: Progressing   Problem: Education: Goal: Knowledge of General Education information will improve Description: Including pain rating scale, medication(s)/side effects and non-pharmacologic comfort measures Outcome: Not Progressing   Problem: Health Behavior/Discharge Planning: Goal: Ability to manage health-related needs will improve Outcome: Not Progressing   Problem: Clinical Measurements: Goal: Ability to maintain clinical measurements within normal limits will improve Outcome: Not Progressing Goal: Will remain free from infection Outcome: Not Progressing Goal: Diagnostic test results will improve Outcome: Not Progressing Goal: Respiratory complications will improve Outcome: Not Progressing Goal: Cardiovascular complication will be avoided Outcome: Not Progressing   Problem: Activity: Goal: Risk for activity intolerance will decrease Outcome: Not Progressing   Problem: Nutrition: Goal: Adequate nutrition will be maintained Outcome: Not Progressing   Problem: Coping: Goal: Level of anxiety will decrease Outcome: Not Progressing   Problem: Elimination: Goal: Will not experience complications related to bowel motility Outcome: Not Progressing Goal: Will not experience complications related to urinary retention Outcome: Not Progressing   Problem: Safety: Goal: Ability to remain free from injury will improve Outcome: Not Progressing   Problem: Skin Integrity: Goal: Risk for impaired skin integrity will decrease Outcome: Not Progressing   Problem: Education: Goal: Ability to describe self-care measures that may prevent or decrease complications (Diabetes Survival Skills Education) will improve Outcome: Not Progressing Goal: Individualized Educational Video(s) Outcome: Not  Progressing   Problem: Coping: Goal: Ability to adjust to condition or change in health will improve Outcome: Not Progressing   Problem: Fluid Volume: Goal: Ability to maintain a balanced intake and output will improve Outcome: Not Progressing   Problem: Health Behavior/Discharge Planning: Goal: Ability to identify and utilize available resources and services will improve Outcome: Not Progressing Goal: Ability to manage health-related needs will improve Outcome: Not Progressing   Problem: Metabolic: Goal: Ability to maintain appropriate glucose levels will improve Outcome: Not Progressing   Problem: Nutritional: Goal: Progress toward achieving an optimal weight will improve Outcome: Not Progressing   Problem: Skin Integrity: Goal: Risk for impaired skin integrity will decrease Outcome: Not Progressing   Problem: Tissue Perfusion: Goal: Adequacy of tissue perfusion will improve Outcome: Not Progressing   Problem: Activity: Goal: Ability to tolerate increased activity will improve Outcome: Not Progressing   Problem: Respiratory: Goal: Ability to maintain a clear airway and adequate ventilation will improve Outcome: Not Progressing   Problem: Role Relationship: Goal: Method of communication will improve Outcome: Not Progressing   Problem: Safety: Goal: Non-violent Restraint(s) Outcome: Completed/Met

## 2022-08-07 NOTE — Progress Notes (Signed)
Upon assessment at 2029/11/13, alerted by family with concerns for zero respirations on the monitor, myself and secondary nurse noted irregular respirations and bradycardia. Family was at bedside at his time. Shortly after at 2032/11/13 patient time of death was called by Meda Klinefelter and Barnet Glasgow, RN.

## 2022-08-07 NOTE — Progress Notes (Signed)
Called to provide support to family after death of patient.Met wife, daughter and sister bedside. Provided presence and support as well as prayer after requested.

## 2022-08-07 NOTE — Progress Notes (Signed)
ANTICOAGULATION CONSULT NOTE - Follow Up Consult  Pharmacy Consult for Heparin Indication: pulmonary embolus  No Known Allergies  Patient Measurements: Height: '5\' 11"'$  (180.3 cm) Weight: 89 kg (196 lb 3.4 oz) IBW/kg (Calculated) : 75.3 Heparin Dosing Weight: 81 kg  Vital Signs: Temp: 97.7 F (36.5 C) (12/08 0329) Temp Source: Axillary (12/08 0329) BP: 98/58 (12/08 0400) Pulse Rate: 115 (12/08 0400)  Labs: Recent Labs    07/11/22 1531 07/12/22 0438 07/12/22 0438 07/13/22 0500 07/13/22 0530 2022-07-20 0500  HGB  --  7.9*   < > 7.5*  --  7.4*  HCT  --  25.2*  --  23.0*  --  23.0*  PLT  --  118*  --  120*  --  130*  HEPARINUNFRC  --  0.49  --   --  0.31 0.29*  CREATININE  --  3.09*  --  4.39*  --   --   TROPONINIHS 30*  --   --   --   --   --    < > = values in this interval not displayed.     Estimated Creatinine Clearance: 16.9 mL/min (A) (by C-G formula based on SCr of 4.39 mg/dL (H)).   Medications:  Infusions:   sodium chloride Stopped (07/06/22 2006)   sodium chloride 5 mL/hr at 2022-07-20 0400   fentaNYL infusion INTRAVENOUS 150 mcg/hr (07-20-2022 0400)   heparin 1,700 Units/hr (20-Jul-2022 0400)   meropenem (MERREM) IV Stopped (07/13/22 2207)   norepinephrine (LEVOPHED) Adult infusion Stopped (07/08/22 0241)   TPN ADULT (ION) 85 mL/hr at 2022-07-20 0400    Assessment: 78 yoM admitted on 18/29/9371 with complications from widespread intraperitoneal malignancy.  Pharmacy has been consulted to dose Heparin for new PE on CT (and suspicious for right common iliac DVT ). Dopplers negative for DVT.  Significant events: - 11/28: hold for maroon NGO & Hg drop to 7.3 - 11/29 EGD: esophagitis, no bleeding; non-bleeding gastric ulcers - 11/30 resume UFH, no bolus  Today, 20-Jul-2022: Heparin level 0.29, now just below goal range on heparin 1700 units/hr CBC:  Hgb decreased to 7.4, Plt 130 No bleeding or complications reported (s/p paracentesis 12/6) SCr significantly  increasing Family meeting at 1100 to discuss transition to comfort focused care  Goal of Therapy:  Heparin level 0.3-0.7 units/ml Monitor platelets by anticoagulation protocol: Yes   Plan:  No bolus Increase heparin IV infusion to 1800 units/hr Daily heparin level and CBC Continue to monitor H&H and platelets  Netta Cedars, PharmD, BCPS 07/20/2022 5:47 AM

## 2022-08-07 NOTE — Progress Notes (Signed)
RT NOTE:  Pt extubated to comfort care measures per CCMD order/pt/family request.

## 2022-08-07 NOTE — Discharge Summary (Signed)
DEATH SUMMARY   Patient Details  Name: Cameron Rice MRN: [id number] DOB: [birth year]  Admission/Discharge Information   Admit Date:  07-21-22  Date of Death: Date of Death: 08-13-2022  Time of Death: Time of Death: 11/16/32  Length of Stay: 27-Nov-2022  Referring Physician: No primary care provider on file.   Reason(s) for Hospitalization  Peritoneal carcinomatosis Small bowel obstruction Aspiration pneumonia Acute hypoxic respiratory failure Acute PE Peritonitis  Diagnoses  Preliminary cause of death:  Secondary Diagnoses (including complications and co-morbidities):  Principal Problem:   SBO (small bowel obstruction) (HCC) Active Problems:   Essential hypertension   Complicated UTI (urinary tract infection)   BPH (benign prostatic hyperplasia)   Pure hypercholesterolemia   LFT elevation   Malnutrition of moderate degree   Peritoneal carcinomatosis (HCC)   Metastatic adenocarcinoma (HCC)   Postoperative ileus (HCC)   Acute hypoxic respiratory failure (HCC)   Palliative care encounter   Elevated liver enzymes   Ileus (HCC)   Goals of care, counseling/discussion   Need for emotional support   Other ascites   Acute renal failure ([place])   Counseling and coordination of care   Brief Hospital Course (including significant findings, care, treatment, and services provided and events leading to death)  Cameron Rice is a 72 y.o. year old male who had SBO in setting of peritoneal carcinomatosis discovered on ex lap 06/26/2022. His course was complicated by prolonged mechanical ventilation in setting of acute hypoxic respiratory failure with aspiration pneumonia, likely recurrent abdominal compartment syndrome with tense malignant ascites, acute renal failure. Palliative care involved and following discussion with family decision made to transition to comfort measures only on 08-14-2023 and he was pronounced dead at November 16, 2032.    Pertinent Labs and Studies  Significant Diagnostic  Studies CT ABDOMEN PELVIS WO CONTRAST  Result Date: 07/10/2022 CLINICAL DATA:  A 72 year old male presents for evaluation of complications related to pneumonia. Also with history of peritoneal carcinomatosis of uncertain etiology. * Tracking Code: BO * EXAM: CT CHEST, ABDOMEN AND PELVIS WITHOUT CONTRAST TECHNIQUE: Multidetector CT imaging of the chest, abdomen and pelvis was performed following the standard protocol without IV contrast. RADIATION DOSE REDUCTION: This exam was performed according to the departmental dose-optimization program which includes automated exposure control, adjustment of the mA and/or kV according to patient size and/or use of iterative reconstruction technique. COMPARISON:  Multiple prior studies have been performed recently most recent imaging from November 26 of the chest with CT and November twenty-seventh of the abdomen and pelvis with CT. FINDINGS: CT CHEST FINDINGS Cardiovascular: Calcified aortic atherosclerotic changes are mild. LEFT-sided IJ central venous catheter terminates in the area of the caval to atrial junction. LEFT-sided PICC line terminating in the low SVC. No pericardial nodularity. Trace pericardial effusion. Central pulmonary vasculature is of normal caliber. Limited assessment of cardiovascular structures given lack of intravenous contrast. Mediastinum/Nodes: Endotracheal tube in place terminating in the mid to low trachea. Gastric tube coursing into the abdomen. No signs of adenopathy in the chest. Lungs/Pleura: Patchy basilar airspace disease with more confluent areas dependently. Seen mainly in the lower lobes and improved since the CT of the abdomen of July 03, 2022 still with signs of airspace disease in filling of lower lobe bronchi with material worse on the RIGHT than the LEFT. Patchy areas of ground-glass are seen elsewhere in the chest and or mild and not substantially changed. Musculoskeletal: See below for full musculoskeletal details. CT ABDOMEN  PELVIS FINDINGS Hepatobiliary: Scalloped margin of the liver. Dilated  gallbladder up to 5 cm in the axial plane. Pancreas: No gross pancreatic abnormality. Spleen: Peritoneal fluid in nodularity about the anterior spleen. Adrenals/Urinary Tract: Adrenal glands are normal. Mild perinephric stranding similar to prior imaging. No hydronephrosis. Gas in the urinary bladder. Stomach/Bowel: Gastric tube in place, tip in the gastric antrum coiled with redundant loop in the fundus. Small-bowel dilation weakly opacified with ingested contrast media presumably and measuring less than 3 cm maximal caliber, less distended than on previous imaging. Diffuse interloop fluid has increased since imaging from November 27th. Nodularity in the ileocolic mesentery at along margins of the colon along the anti mesenteric border of the colon is similar grossly. Vascular/Lymphatic: Aortic atherosclerosis. No sign of aneurysm. Smooth contour of the IVC. There is no gastrohepatic or hepatoduodenal ligament lymphadenopathy. No retroperitoneal or mesenteric lymphadenopathy. No pelvic sidewall lymphadenopathy. Reproductive: Unremarkable. Other: Moderate LEFT inguinal hernia contains fat. Body wall edema perhaps slightly worse than on previous imaging. Nodularity in the upper abdomen in the peritoneum and omentum. Musculoskeletal: No acute bone finding. No destructive bone process. Spinal degenerative changes. IMPRESSION: 1. Signs of aspiration related pneumonia/pneumonitis improved since July 03, 2022. Still with material in lower lobe bronchi and basilar airspace disease. 2. Increased ascites and interloop fluid in this patient with known peritoneal carcinomatosis. 3. Signs of serosal disease of involved bowel loops with diminished dilation of small bowel since previous imaging. No current evidence of gross obstruction. 4. Increasingly distended gallbladder could reflect fasting state though would suggest correlation with any RIGHT upper  quadrant symptoms or laboratory abnormalities and with HIDA scan as warranted. 5. Aortic atherosclerosis. Aortic Atherosclerosis (ICD10-I70.0). Electronically Signed   By: Zetta Bills M.D.   On: 07/10/2022 14:05   CT CHEST WO CONTRAST  Result Date: 07/10/2022 CLINICAL DATA:  A 72 year old male presents for evaluation of complications related to pneumonia. Also with history of peritoneal carcinomatosis of uncertain etiology. * Tracking Code: BO * EXAM: CT CHEST, ABDOMEN AND PELVIS WITHOUT CONTRAST TECHNIQUE: Multidetector CT imaging of the chest, abdomen and pelvis was performed following the standard protocol without IV contrast. RADIATION DOSE REDUCTION: This exam was performed according to the departmental dose-optimization program which includes automated exposure control, adjustment of the mA and/or kV according to patient size and/or use of iterative reconstruction technique. COMPARISON:  Multiple prior studies have been performed recently most recent imaging from November 26 of the chest with CT and November twenty-seventh of the abdomen and pelvis with CT. FINDINGS: CT CHEST FINDINGS Cardiovascular: Calcified aortic atherosclerotic changes are mild. LEFT-sided IJ central venous catheter terminates in the area of the caval to atrial junction. LEFT-sided PICC line terminating in the low SVC. No pericardial nodularity. Trace pericardial effusion. Central pulmonary vasculature is of normal caliber. Limited assessment of cardiovascular structures given lack of intravenous contrast. Mediastinum/Nodes: Endotracheal tube in place terminating in the mid to low trachea. Gastric tube coursing into the abdomen. No signs of adenopathy in the chest. Lungs/Pleura: Patchy basilar airspace disease with more confluent areas dependently. Seen mainly in the lower lobes and improved since the CT of the abdomen of July 03, 2022 still with signs of airspace disease in filling of lower lobe bronchi with material worse on  the RIGHT than the LEFT. Patchy areas of ground-glass are seen elsewhere in the chest and or mild and not substantially changed. Musculoskeletal: See below for full musculoskeletal details. CT ABDOMEN PELVIS FINDINGS Hepatobiliary: Scalloped margin of the liver. Dilated gallbladder up to 5 cm in the axial plane. Pancreas: No  gross pancreatic abnormality. Spleen: Peritoneal fluid in nodularity about the anterior spleen. Adrenals/Urinary Tract: Adrenal glands are normal. Mild perinephric stranding similar to prior imaging. No hydronephrosis. Gas in the urinary bladder. Stomach/Bowel: Gastric tube in place, tip in the gastric antrum coiled with redundant loop in the fundus. Small-bowel dilation weakly opacified with ingested contrast media presumably and measuring less than 3 cm maximal caliber, less distended than on previous imaging. Diffuse interloop fluid has increased since imaging from November 27th. Nodularity in the ileocolic mesentery at along margins of the colon along the anti mesenteric border of the colon is similar grossly. Vascular/Lymphatic: Aortic atherosclerosis. No sign of aneurysm. Smooth contour of the IVC. There is no gastrohepatic or hepatoduodenal ligament lymphadenopathy. No retroperitoneal or mesenteric lymphadenopathy. No pelvic sidewall lymphadenopathy. Reproductive: Unremarkable. Other: Moderate LEFT inguinal hernia contains fat. Body wall edema perhaps slightly worse than on previous imaging. Nodularity in the upper abdomen in the peritoneum and omentum. Musculoskeletal: No acute bone finding. No destructive bone process. Spinal degenerative changes. IMPRESSION: 1. Signs of aspiration related pneumonia/pneumonitis improved since July 03, 2022. Still with material in lower lobe bronchi and basilar airspace disease. 2. Increased ascites and interloop fluid in this patient with known peritoneal carcinomatosis. 3. Signs of serosal disease of involved bowel loops with diminished dilation of  small bowel since previous imaging. No current evidence of gross obstruction. 4. Increasingly distended gallbladder could reflect fasting state though would suggest correlation with any RIGHT upper quadrant symptoms or laboratory abnormalities and with HIDA scan as warranted. 5. Aortic atherosclerosis. Aortic Atherosclerosis (ICD10-I70.0). Electronically Signed   By: Zetta Bills M.D.   On: 07/10/2022 14:05   DG Abd 1 View  Result Date: 07/09/2022 CLINICAL DATA:  NG placement. EXAM: ABDOMEN - 1 VIEW COMPARISON:  Abdominal radiograph dated 07/07/2022. FINDINGS: Enteric tube extends below the diaphragm and makes a loop in the proximal stomach with tip in the region of the body of the stomach. Partially visualized additional support apparatus in similar position. Small left pleural effusion and left lung base atelectasis or infiltrate. IMPRESSION: Enteric tube with tip in the body of the stomach. Electronically Signed   By: Anner Crete M.D.   On: 07/09/2022 21:40   DG Abd Portable 1V  Result Date: 07/07/2022 CLINICAL DATA:  Encounter for nasogastric tube placement. EXAM: PORTABLE ABDOMEN - 1 VIEW COMPARISON:  Abdominal radiograph 07/07/2022 at 4:51 a.m.; CT abdomen and pelvis FINDINGS: Interval repositioning of enteric tube with the prior flexion at the side port no longer seen. The enteric tube extends approximately 10 cm into the stomach and maintains a straight course without the prior kink. Mild oral contrast is again seen within the transverse and descending colon. Note is again made of fluid distended small bowel loops on prior CT, which would not be well visualized on radiograph. Hazy appearance of the abdomen again correlating with ascites seen on prior CT. Left basilar heterogeneous airspace lung opacity is grossly similar to prior. Mild calcification within aortic arch. No acute skeletal abnormality. IMPRESSION: Interval repositioning of enteric tube with the prior flexion at the side port no  longer visualized. The enteric tube extends approximately 10 cm into the stomach and maintains a straight course without the prior kink. Electronically Signed   By: Yvonne Kendall M.D.   On: 07/07/2022 11:06   DG CHEST PORT 1 VIEW  Result Date: 07/07/2022 CLINICAL DATA:  Acute respiratory failure EXAM: PORTABLE CHEST 1 VIEW COMPARISON:  07/06/2022 FINDINGS: Enteric tube with tip just below the  clavicular heads. Left PICC and left IJ line with tips at the upper right atrium. The enteric tube reaches the stomach. Low volume chest with streaky density at the bases. No edema, effusion, or pneumothorax. IMPRESSION: 1. Unremarkable hardware as described. 2. Low volume chest with unchanged atelectasis at the bases. Electronically Signed   By: Jorje Guild M.D.   On: 07/07/2022 05:41   DG Abd Portable 1V  Result Date: 07/07/2022 CLINICAL DATA:  Abdominal distension EXAM: PORTABLE ABDOMEN - 1 VIEW COMPARISON:  Yesterday.  Abdominal CT 07/03/2022 FINDINGS: Enteric contrast has partially cleared from the colon. Enteric tube with flexion at the side-port. Hazy appearance of the abdomen correlating with ascites on recent CT. IMPRESSION: 1. Hazy abdomen with ascites based on recent CT. There were also fluid distended small bowel loops by CT which would be in apparent on radiography. 2. Located enteric tube with kinking at the proximal side port. Electronically Signed   By: Jorje Guild M.D.   On: 07/07/2022 05:40   VAS [place] UPPER EXTREMITY ARTERIAL DUPLEX  Result Date: 07/06/2022  UPPER EXTREMITY DUPLEX STUDY Patient Name:  Nicki L Rice  Date of Exam:   07/06/2022 Medical Rec #: [id number]         Accession #:    [id number] Date of Birth: June 28, 1951         Patient Gender: M Patient Age:   80 years Exam Location:  [facility] Procedure:      VAS [place] UPPER EXTREMITY ARTERIAL DUPLEX Referring Phys: Noe Gens --------------------------------------------------------------------------------  Indications:  Cold/dusky hand. History:     Patient has a history of Right radial Aline.  Risk Factors: Hypertension. Comparison Study: No prior studies. Performing Technologist: Oliver Hum RVT  Examination Guidelines: A complete evaluation includes B-mode imaging, spectral Doppler, color Doppler, and power Doppler as needed of all accessible portions of each vessel. Bilateral testing is considered an integral part of a complete examination. Limited examinations for reoccurring indications may be performed as noted.  Right Doppler Findings: +---------------+----------+---------+--------+--------+ Site           PSV (cm/s)Waveform StenosisComments +---------------+----------+---------+--------+--------+ Subclavian Mid 74        triphasic                 +---------------+----------+---------+--------+--------+ Subclavian Dist          triphasic                 +---------------+----------+---------+--------+--------+ Axillary       84        triphasic                 +---------------+----------+---------+--------+--------+ Brachial Prox  98        triphasic                 +---------------+----------+---------+--------+--------+ Brachial Dist  73        triphasic                 +---------------+----------+---------+--------+--------+ Radial Prox    53        triphasic                 +---------------+----------+---------+--------+--------+ Radial Mid     56        triphasic                 +---------------+----------+---------+--------+--------+ Radial Dist    49        triphasic                 +---------------+----------+---------+--------+--------+  Ulnar Prox     92        triphasic                 +---------------+----------+---------+--------+--------+ Ulnar Mid      63        triphasic                 +---------------+----------+---------+--------+--------+ Ulnar Dist     41        triphasic                  +---------------+----------+---------+--------+--------+ Incidental finding of DVT within one of the paired brachial veins, and the radial veins. SVT noted within the cephalic vein of the mid forearm.   Summary:  Right: No obstruction visualized in the right upper extremity        Incidental finding of DVT within one of the paired brachial        veins, and the radial veins.        SVT noted within the cephalic vein of the mid forearm. *See table(s) above for measurements and observations. Electronically signed by Jamelle Haring on 07/06/2022 at 5:30:01 PM.    Final    DG Abd 1 View  Result Date: 07/06/2022 CLINICAL DATA:  NGT placement EXAM: ABDOMEN - 1 VIEW COMPARISON:  06/25/2022. FINDINGS: Limited examination of the upper abdomen. NG tube tip superimposed with the stomach below diaphragm. IMPRESSION: NGT in place. Electronically Signed   By: Sammie Bench M.D.   On: 07/06/2022 11:39   DG CHEST PORT 1 VIEW  Result Date: 07/06/2022 CLINICAL DATA:  Acute respiratory failure with hypoxia. Endotracheally intubated. EXAM: PORTABLE CHEST 1 VIEW COMPARISON:  07/04/2022 FINDINGS: Patient is rotated to the left on the current exam. Support lines and tubes in appropriate position. Low lung volumes are again demonstrated. Infiltrate or atelectasis at both lung bases shows no significant change. IMPRESSION: No significant change in bibasilar atelectasis versus infiltrates. Electronically Signed   By: Marlaine Hind M.D.   On: 07/06/2022 08:50   DG CHEST PORT 1 VIEW  Result Date: 07/06/2022 CLINICAL DATA:  Endotracheal tube adjustment EXAM: PORTABLE CHEST 1 VIEW COMPARISON:  07/06/2022 at 0456 hours FINDINGS: Endotracheal tube terminates 3.6 cm above the carina. Enteric tube is positioned within the stomach. Left PICC line and left IJ approach central line both terminate near the superior cavoatrial junction. Stable heart size. Low lung volumes with bibasilar opacities. Probable trace right pleural effusion. No  pneumothorax. IMPRESSION: 1. Endotracheal tube terminates 3.6 cm above the carina. 2. Low lung volumes with bibasilar opacities. Probable trace right pleural effusion. Electronically Signed   By: Davina Poke D.O.   On: 07/06/2022 08:27   DG Abd 1 View  Result Date: 07/03/2022 CLINICAL DATA:  Enteric catheter placement EXAM: ABDOMEN - 1 VIEW COMPARISON:  07/03/2022 FINDINGS: Frontal view of the lower chest and upper abdomen demonstrates enteric catheter tip and side port projecting over the gastric fundus. Paucity of small bowel gas. Residual contrast within the colon. Patchy bibasilar lung consolidation is noted. IMPRESSION: 1. Enteric catheter tip projecting over the gastric fundus. Electronically Signed   By: Randa Ngo M.D.   On: 06/15/2022 16:24   DG CHEST PORT 1 VIEW  Result Date: 07/04/2022 CLINICAL DATA:  Central line placement EXAM: PORTABLE CHEST 1 VIEW COMPARISON:  Chest 07/04/2022 FINDINGS: Endotracheal tube in good position. Left jugular central venous catheter tip difficult to visualize but probably near the cavoatrial junction or right atrium. Left arm PICC  tip also in place entering the SVC with tip not well visualized. Mild thorax NG tube in the stomach Bibasilar airspace disease unchanged. Small pleural effusions unchanged. IMPRESSION: 1. Central venous catheter tip difficult to visualize but probably near the cavoatrial junction or right atrium. 2. Bibasilar airspace disease and small effusions unchanged. Electronically Signed   By: Franchot Gallo M.D.   On: 07/04/2022 16:54   DG CHEST PORT 1 VIEW  Result Date: 07/04/2022 CLINICAL DATA:  Intubation. EXAM: PORTABLE CHEST 1 VIEW COMPARISON:  07/03/2022 FINDINGS: The endotracheal tube is 2.6 cm above the carina. The NG tube is coursing down the esophagus and into the stomach. Small bilateral pleural effusions and slightly progressive bibasilar atelectasis. IMPRESSION: 1. Endotracheal tube and NG tube in good position. 2. Small  bilateral pleural effusions and slightly progressive bibasilar infiltrates. Electronically Signed   By: Marijo Sanes M.D.   On: 07/04/2022 14:28   CT ABDOMEN PELVIS WO CONTRAST  Result Date: 07/03/2022 CLINICAL DATA:  Peritoneal carcinomatosis with unclear primary source. Follow-up small bowel obstruction. Recent umbilical hernia surgery 06/08/2022. EXAM: CT ABDOMEN AND PELVIS WITHOUT CONTRAST TECHNIQUE: Multidetector CT imaging of the abdomen and pelvis was performed following the standard protocol without IV contrast. RADIATION DOSE REDUCTION: This exam was performed according to the departmental dose-optimization program which includes automated exposure control, adjustment of the mA and/or kV according to patient size and/or use of iterative reconstruction technique. COMPARISON:  CTA chest 07/02/2022, abdomen and pelvis CT with contrast 06/28/2022, and chest, abdomen and pelvis CT with contrast 06/24/2022. FINDINGS: Lower chest: There are increased small pleural effusions, increasing consolidation change in both lower lobes, and patchy infiltrates also increased in posterior lingula, right upper lobe base and right middle lobe. Findings consistent with multi lobar pneumonia and small parapneumonic effusions. The heart is slightly enlarged. There are coronary artery calcifications. Small pericardial effusion is similar to prior studies. An infusion catheter terminates in the right atrium. Hepatobiliary: The liver, gallbladder and bile ducts show no focal abnormality without contrast. No biliary dilatation. Pancreas: No focal abnormality is seen without contrast. Some of the abdominal ascitic fluid abuts the body of the pancreas. Spleen: Not well seen due to streak artifact from the patient's arms in the field and lack of IV contrast. It is not enlarged. The last study with contrast on November 22 demonstrated several small areas of hypoenhancement in the periphery of the spleen potentially representing  peritoneal surface lesions, but the spleen was unremarkable on November 18. I cannot tell if this is present on this study without contrast and with streak artifacts through the spleen. Adrenals/Urinary Tract: There is residual renal cortical enhancement. It has been over 24 hours since the contrast dose from yesterday's CTA chest. Please correlate clinically for contrast nephrotoxicity. A 1.6 cm cyst anteriorly in the left kidney is again noted. There are a few tiny too small to characterize hypodensities in the kidneys with no follow-up imaging recommended. No adrenal mass is seen and no urinary stone obstruction. There is mild generalized bladder thickening versus nondistention with contrast in the bladder as well. Stomach/Bowel: NGT loops around within the stomach to the left with the tip just below the cardia. There is moderate debris and fluid within the stomach with gastric distention with air and fluid, distention of the duodenum, jejunum and at least the proximal 2/3 of the ileum with small-bowel dilatation up to 4.4 cm. There are multiple thickened small bowel segments interspersed with nonthickened segments and no single transitional segment could  be identified. The small bowel in the lower abdominopelvic quadrants is decompressed and again noted moderately thickened, which could be due to serosal implants. Obstructive etiology could be distal small bowel infiltrating disease, enteritis, occult adhesions or occult internal hernia. Areas of likely peritoneal carcinomatosis of the hepatic and splenic flexures is again noted as well as moderate thickening in the distal sigmoid colon which could reflect changes due to colitis, underdistention or infiltrating disease. Vascular/Lymphatic: There is moderate aortoiliac calcific plaque, calcifications in the visceral branch vessel ostia. There is no AAA. There is no discrete adenopathy but there is evidence of diffuse peritoneal carcinomatosis with extensive  omental and peritoneal disease with numerous nodular implants anteriorly. These findings were noted previously but better seen with less ascites following 4.2 L paracentesis November 24. Reproductive: Enlarged prostate 5 cm transverse. Other: Mild-to-moderate abdominopelvic free ascites, less than previously. Body wall anasarca appears similar. There is a left inguinal fat hernia. There is no incarcerated hernia. Musculoskeletal: Bilateral mild-to-moderate hip DJD. Degenerative changes and bridging enthesopathy thoracic spine. Degenerative change lumbar spine. Acquired spinal stenosis L3-4, L4-5 and L5-S1 with ankylosis across the superior right SI joint. IMPRESSION: 1. Small-bowel obstruction with multiple thickened small bowel segments interspersed with nonthickened segments and no single transitional segment identified. Obstructive etiology could be due to infiltrating disease with irregular lower abdominal small bowel thickening possibly indicating serosal implants, or due to enteritis, occult adhesions or occult internal hernia. 2. Thickened distal sigmoid colon which could be due to colitis, underdistention or infiltrating disease. Additional peritoneal implants along the flexures of the colon. 3. Extensive peritoneal and omental carcinomatosis with mild-to-moderate free ascites, less than previously. 4. Increasing consolidation in both lower lobes and patchy infiltrates in the right upper lobe, right middle lobe and lingula. Increased small pleural effusions. 5. Body wall anasarca. 6. Residual bilateral renal cortical enhancement. Please correlate clinically for contrast nephrotoxicity. 7. Aortic and coronary artery atherosclerosis. 8. Prostatomegaly. 9. Left inguinal fat hernia. No incarcerated hernia. 10. Suboptimal visualization of the spleen due to streak artifact from the patient's arms in the field and lack of IV contrast. The spleen was unremarkable on November 06/24/2022, but on November 22 there were  areas of peripheral hypoenhancement which were questioned as possibly due to peritoneal surface lesions. Unable to determine on this study if this is still the case. 11. These results will be called to the ordering physician or representative by the radiologist assistant, and communication documented in the PACS or [facility] dashboard. Aortic Atherosclerosis (ICD10-I70.0). Electronically Signed   By: Telford Nab M.D.   On: 07/03/2022 21:19   CT HEAD WO CONTRAST (5MM)  Result Date: 07/03/2022 CLINICAL DATA:  Stroke suspected EXAM: CT HEAD WITHOUT CONTRAST TECHNIQUE: Contiguous axial images were obtained from the base of the skull through the vertex without intravenous contrast. RADIATION DOSE REDUCTION: This exam was performed according to the departmental dose-optimization program which includes automated exposure control, adjustment of the mA and/or kV according to patient size and/or use of iterative reconstruction technique. COMPARISON:  None Available. FINDINGS: Brain: No evidence of acute infarction, hemorrhage, hydrocephalus, extra-axial collection or mass lesion/mass effect. Sequela of mild-to-moderate chronic microvascular ischemic change. Chronic mineralization of the basal ganglia on the right. Vascular: No hyperdense vessel or unexpected calcification. Skull: Normal. Negative for fracture or focal lesion. Sinuses/Orbits: No acute finding. Other: None. IMPRESSION: No acute intracranial abnormality. Electronically Signed   By: Marin Roberts M.D.   On: 07/03/2022 20:41   ECHOCARDIOGRAM COMPLETE  Result Date: 07/03/2022  ECHOCARDIOGRAM REPORT   Patient Name:   Huan L Rice Date of Exam: 07/03/2022 Medical Rec #:  [id number]        Height:       71.0 in Accession #:    [id number]       Weight:       168.0 lb Date of Birth:  23-Jun-1951        BSA:          1.958 m Patient Age:    72 years         BP:           94/47 mmHg Patient Gender: M                HR:           120 bpm. Exam Location:   Inpatient Procedure: 2D Echo, Cardiac Doppler and Color Doppler Indications:    R06.02 SOB. Acute respiratory distress.  History:        Patient has no prior history of Echocardiogram examinations.                 Risk Factors:Hypertension and Dyslipidemia. Cancer.  Sonographer:    Roseanna Rainbow RDCS Referring Phys: [id number] [place]  Sonographer Comments: Technically difficult study due to poor echo windows. Patient in fowler's position. Very difficult angle and windows. IMPRESSIONS  1. Left ventricular ejection fraction, by estimation, is 60 to 65%. The left ventricle has normal function. The left ventricle has no regional wall motion abnormalities. Left ventricular diastolic parameters are indeterminate.  2. Mildly D-shaped right ventricle suggesting a degree of RV pressure/volume overload. Right ventricular systolic function is mildly reduced. The right ventricular size is mildly enlarged. Tricuspid regurgitation signal is inadequate for assessing PA pressure.  3. The mitral valve is normal in structure. No evidence of mitral valve regurgitation. No evidence of mitral stenosis.  4. The aortic valve is tricuspid. Aortic valve regurgitation is not visualized. No aortic stenosis is present.  5. The inferior vena cava is normal in size with greater than 50% respiratory variability, suggesting right atrial pressure of 3 mmHg.  6. A small pericardial effusion is present. The pericardial effusion is circumferential. FINDINGS  Left Ventricle: Left ventricular ejection fraction, by estimation, is 60 to 65%. The left ventricle has normal function. The left ventricle has no regional wall motion abnormalities. The left ventricular internal cavity size was normal in size. There is  no left ventricular hypertrophy. Left ventricular diastolic parameters are indeterminate. Right Ventricle: Mildly D-shaped right ventricle suggesting a degree of RV pressure/volume overload. The right ventricular size is mildly enlarged. No  increase in right ventricular wall thickness. Right ventricular systolic function is mildly reduced. Tricuspid regurgitation signal is inadequate for assessing PA pressure. Left Atrium: Left atrial size was normal in size. Right Atrium: Right atrial size was normal in size. Pericardium: A small pericardial effusion is present. The pericardial effusion is circumferential. Mitral Valve: The mitral valve is normal in structure. No evidence of mitral valve regurgitation. No evidence of mitral valve stenosis. Tricuspid Valve: The tricuspid valve is normal in structure. Tricuspid valve regurgitation is trivial. Aortic Valve: The aortic valve is tricuspid. Aortic valve regurgitation is not visualized. No aortic stenosis is present. Pulmonic Valve: The pulmonic valve was not well visualized. Pulmonic valve regurgitation is not visualized. Aorta: The aortic root is normal in size and structure. Venous: The inferior vena cava is normal in size with greater than 50% respiratory variability, suggesting right atrial pressure  of 3 mmHg. IAS/Shunts: No atrial level shunt detected by color flow Doppler.  LEFT VENTRICLE PLAX 2D LVIDd:         3.60 cm     Diastology LVIDs:         2.35 cm     LV e' medial:    9.46 cm/s LV PW:         1.20 cm     LV E/e' medial:  4.9 LV IVS:        1.10 cm     LV e' lateral:   9.03 cm/s LVOT diam:     2.40 cm     LV E/e' lateral: 5.1 LV SV:         60 LV SV Index:   30 LVOT Area:     4.52 cm  LV Volumes (MOD) LV vol d, MOD A2C: 41.9 ml LV vol d, MOD A4C: 38.0 ml LV vol s, MOD A2C: 15.4 ml LV vol s, MOD A4C: 12.0 ml LV SV MOD A2C:     26.5 ml LV SV MOD A4C:     38.0 ml LV SV MOD BP:      30.4 ml RIGHT VENTRICLE             IVC RV S prime:     18.70 cm/s  IVC diam: 2.00 cm TAPSE (M-mode): 1.8 cm LEFT ATRIUM             Index       RIGHT ATRIUM           Index LA diam:        3.10 cm 1.58 cm/m  RA Area:     10.70 cm LA Vol (A2C):   16.4 ml 8.38 ml/m  RA Volume:   17.90 ml  9.14 ml/m LA Vol (A4C):    15.4 ml 7.87 ml/m LA Biplane Vol: 16.1 ml 8.22 ml/m  AORTIC VALVE LVOT Vmax:   102.00 cm/s LVOT Vmean:  62.700 cm/s LVOT VTI:    0.132 m  AORTA Ao Root diam: 3.30 cm Ao Asc diam:  3.00 cm MITRAL VALVE MV Area (PHT): 4.31 cm    SHUNTS MV Decel Time: 176 msec    Systemic VTI:  0.13 m MV E velocity: 46.00 cm/s  Systemic Diam: 2.40 cm MV A velocity: 46.40 cm/s MV E/A ratio:  0.99 MV A Prime:    9.6 cm/s [employer] Electronically signed by Franki Monte Signature Date/Time: 07/03/2022/3:13:07 PM    Final    DG CHEST PORT 1 VIEW  Result Date: 07/03/2022 CLINICAL DATA:  Aspiration pneumonia EXAM: PORTABLE CHEST 1 VIEW COMPARISON:  07/03/2022 FINDINGS: Left-sided PICC line with the tip projecting over the SVC. Nasogastric tube with the tip projecting over the stomach. Hazy bibasilar airspace disease concerning for pneumonia. No pleural effusion or pneumothorax. Heart and mediastinal contours are unremarkable. No acute osseous abnormality. IMPRESSION: 1. Hazy bibasilar airspace disease concerning for pneumonia. Electronically Signed   By: Kathreen Devoid M.D.   On: 07/03/2022 08:52   DG CHEST PORT 1 VIEW  Result Date: 07/03/2022 CLINICAL DATA:  Possible aspiration following vomiting, initial encounter EXAM: PORTABLE CHEST 1 VIEW COMPARISON:  07/02/2022 FINDINGS: Cardiac shadow is stable. Left PICC is noted in satisfactory position. Gastric catheter is noted within the stomach. Bibasilar atelectatic changes are noted slightly increased when compared with the prior exam. These changes are similar to that seen on prior CT examination and could be related to aspiration. IMPRESSION: Bibasilar changes  which may be related to aspiration. No other focal abnormality is noted. Electronically Signed   By: Inez Catalina M.D.   On: 07/03/2022 02:51   DG Abd Portable 1V  Result Date: 07/03/2022 CLINICAL DATA:  Check gastric catheter placement EXAM: PORTABLE ABDOMEN - 1 VIEW COMPARISON:  None Available. FINDINGS:  Gastric catheter is coiled within the stomach. Scattered contrast is noted throughout the colon from prior CT examination. No free air is noted. Contrast is seen within the bladder from recent contrast administration. IMPRESSION: Gastric catheter coiled within the stomach. Electronically Signed   By: Inez Catalina M.D.   On: 07/03/2022 02:50   CT Angio Chest Pulmonary Embolism (PE) W or WO Contrast  Result Date: 07/02/2022 CLINICAL DATA:  Pulmonary embolism suspected, high probability. Tachycardia, shortness of breath. EXAM: CT ANGIOGRAPHY CHEST WITH CONTRAST TECHNIQUE: Multidetector CT imaging of the chest was performed using the standard protocol during bolus administration of intravenous contrast. Multiplanar CT image reconstructions and MIPs were obtained to evaluate the vascular anatomy. RADIATION DOSE REDUCTION: This exam was performed according to the departmental dose-optimization program which includes automated exposure control, adjustment of the mA and/or kV according to patient size and/or use of iterative reconstruction technique. CONTRAST:  56m OMNIPAQUE IOHEXOL 350 MG/ML SOLN COMPARISON:  06/24/2022. FINDINGS: Cardiovascular: The heart is normal in size a few scattered coronary artery calcifications are noted. There is a trace pericardial effusion. There is mild atherosclerotic calcification of the aorta without evidence of aneurysm. Small residual thrombus in the left upper lobe. No new thrombus is identified. Evaluation is limited due to respiratory motion artifact. Mediastinum/Nodes: No mediastinal, hilar, or axillary lymphadenopathy. The thyroid gland is within normal limits. Debris is present in the trachea. The esophagus is distended with fluid. Lungs/Pleura: Debris is present in the right mainstem bronchus. There is bronchial wall thickening bilaterally with mucous plugging in the right lower lobe. Patchy airspace disease is noted in the right upper and middle lobes and lower lobes  bilaterally. There is consolidation at the right lung base. No effusion or pneumothorax. An azygous lobe is noted. Upper Abdomen: Large ascites in the upper abdomen. Musculoskeletal: Degenerative changes in the thoracic spine. No acute osseous abnormality. Review of the MIP images confirms the above findings. IMPRESSION: 1. Left upper lobe pulmonary embolism, not significantly changed from the prior exam. No new lesion is identified. 2. Bronchial wall thickening bilaterally with mucous plugging in the right lower lobe. Scattered airspace opacities in the lungs bilaterally with consolidation at the right lung base, concerning for pneumonia. 3. Distended fluid-filled esophagus. Aspiration pneumonia should be included in the differential diagnosis. 4. Aortic atherosclerosis and coronary artery calcifications. 5. Large ascites. Electronically Signed   By: LBrett FairyM.D.   On: 07/02/2022 21:10   DG Chest Port 1 View  Result Date: 07/02/2022 CLINICAL DATA:  Lethargy with tachypnea and tachycardia. EXAM: PORTABLE CHEST 1 VIEW COMPARISON:  June 28, 2022 FINDINGS: There is stable left-sided PICC line positioning. The heart size and mediastinal contours are within normal limits. Low lung volumes are noted. Mild atelectasis is seen within the bilateral lung bases. There is no evidence of a pleural effusion or pneumothorax. Multilevel degenerative changes seen throughout the thoracic spine. IMPRESSION: Low lung volumes with mild bibasilar atelectasis. Electronically Signed   By: TVirgina NorfolkM.D.   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 Request for diagnostic and therapeutic paracentesis. EXAM: ULTRASOUND GUIDED PARACENTESIS MEDICATIONS:  1% lidocaine 10 mL COMPLICATIONS: None immediate. PROCEDURE: Informed written consent was obtained from the patient after a discussion of the risks, benefits and alternatives  to treatment. A timeout was performed prior to the initiation of the procedure. Initial ultrasound scanning demonstrates a large amount of ascites within the right lateral abdomen. The right lateral abdomen was prepped and draped in the usual sterile fashion. 1% lidocaine was used for local anesthesia. Following this, a 19 gauge, 7-cm, Yueh catheter was introduced. An ultrasound image was saved for documentation purposes. The paracentesis was performed. The catheter was removed and a dressing was applied. The patient tolerated the procedure well without immediate post procedural complication. FINDINGS: A total of approximately 4.2 L of amber fluid was removed. Samples were sent to the laboratory as requested by the clinical team. IMPRESSION: Successful ultrasound-guided paracentesis yielding 4.2 liters of peritoneal fluid. Procedure performed by: Gareth Eagle, PA-C Electronically Signed   By: Lucrezia Europe M.D.   On: 06/30/2022 16:27   DG Abd Portable 1V  Result Date: 06/29/2022 CLINICAL DATA:  [id number] Encounter for imaging study to confirm nasogastric (NG) tube placement [id number] EXAM: PORTABLE ABDOMEN - 1 VIEW COMPARISON:  Chest x-ray 06/29/2022, CT abdomen pelvis 06/28/2022 FINDINGS: Interval advancement of an enteric tube coursing below the hemidiaphragm with tip and side port overlying the expected region the gastric lumen. Nonobstructive bowel gas pattern of visualized upper abdomen. The heart and mediastinal contours are unchanged. Aortic calcification. Low lung volumes. Persistent retrocardiac opacity that likely represents linear atelectasis for scarring better evaluated on CT abdomen pelvis 06/28/2022. No pulmonary edema. No pleural effusion. No pneumothorax. No acute osseous abnormality. IMPRESSION: 1. Enteric tube in good position. 2. Low lung volumes. 3. Nonobstructive bowel gas pattern. Electronically Signed   By: Iven Finn M.D.   On: 06/29/2022 18:21   DG Abd Portable 1V  Result Date:  06/29/2022 CLINICAL DATA:  Nasogastric tube evaluation. EXAM: PORTABLE ABDOMEN - 1 VIEW COMPARISON:  None Available. FINDINGS: The bowel gas pattern is normal. Nasogastric tube with distal tip at the GE junction it could be 8-10 cm. Low lung volumes with bibasilar atelectasis. IMPRESSION: Nasogastric tube with distal tip at the GE junction, it could be advanced 8-10 cm. Electronically Signed   By: Keane Police D.O.   On: 06/29/2022 17:39   DG Abd Portable 1V  Result Date: 06/29/2022 CLINICAL DATA:  NG tube placement. EXAM: PORTABLE ABDOMEN - 1 VIEW COMPARISON:  06/26/2022 FINDINGS: Enteric/NG tube is noted with tip overlying the UPPER mediastinum. It is difficult to determine if this lies within the esophagus or trachea on this study. Recommend repositioning with clinical correlation. No other changes identified.  There is no evidence of pneumothorax. IMPRESSION: Enteric/NG tube with tip overlying the UPPER mediastinum, which may lie within the esophagus but recommend repositioning with clinical correlation. Electronically Signed   By: Margarette Canada M.D.   On: 06/29/2022 17:26   CT ABDOMEN PELVIS W CONTRAST  Result Date: 06/28/2022 CLINICAL DATA:  Peritoneal carcinomatosis with abdominal pain. * Tracking Code: BO * EXAM: CT ABDOMEN AND PELVIS WITH CONTRAST TECHNIQUE: Multidetector CT imaging of the abdomen and pelvis was performed using the standard protocol following bolus administration of intravenous contrast. RADIATION DOSE REDUCTION: This exam was performed according to the departmental dose-optimization program which includes automated exposure control, adjustment of the mA and/or kV according to patient size and/or use of iterative reconstruction technique. CONTRAST:  155m OMNIPAQUE IOHEXOL 300 MG/ML  SOLN COMPARISON:  CT scan 06/24/2022 FINDINGS: Lower  chest: Streaky bibasilar atelectasis but no infiltrates or effusion. No pulmonary lesions. Hepatobiliary: No a [place] lesions or intrahepatic  biliary dilatation. No obvious peritoneal surface lesions. The gallbladder is unremarkable. No common bile duct dilatation. Pancreas: No mass, inflammation or ductal dilatation. Spleen: Possible peripheral peritoneal surface lesions. Adrenals/Urinary Tract: Adrenal glands and kidneys are unremarkable. No worrisome renal lesions or hydronephrosis. Stable simple 15 mm left renal cyst not requiring further evaluation or follow-up. Stomach/Bowel: The stomach contains an NG tube. The duodenum, and small bowel are unremarkable. No obstructive findings. Mild bowel wall thickening likely due to the surrounding ascitic fluid. Several areas of peritoneal carcinomatosis involving the colonic wall notably the anti mesenteric border of the hepatic flexure and splenic flexure. No obstructive findings. Vascular/Lymphatic: Stable age advanced atherosclerotic calcifications involving the aorta and branch vessels. The major venous structures are patent. Reproductive: The prostate gland and seminal vesicles are unremarkable. Other: Stable diffuse peritoneal carcinomatosis with extensive omental and peritoneal disease. Interval progressive abdominal/pelvic ascites considered large volume now. Stable left inguinal hernia containing fat. Progressive body wall edema. Musculoskeletal: No significant bony findings. IMPRESSION: 1. Stable diffuse peritoneal carcinomatosis with extensive omental and peritoneal disease. 2. Interval progressive abdominal/pelvic ascites. 3. Several areas of peritoneal carcinomatosis involving the colonic wall but no obstructive findings. 4. Stable age advanced atherosclerotic calcifications involving the aorta and branch vessels. 5. Progressive body wall edema. 6. Aortic atherosclerosis. Aortic Atherosclerosis (ICD10-I70.0). Electronically Signed   By: Marijo Sanes M.D.   On: 06/28/2022 11:20   DG CHEST PORT 1 VIEW  Result Date: 06/28/2022 CLINICAL DATA:  Tachycardia, leukocytosis EXAM: PORTABLE CHEST 1  VIEW COMPARISON:  Radiograph 06/23/2022 FINDINGS: New RIGHT PICC line with tip in the cavoatrial junction. NG tube in stomach. Normal cardiac silhouette. Low lung volumes. No effusion, infiltrate pneumothorax. IMPRESSION: Low lung volumes.  No acute cardiopulmonary process. Electronically Signed   By: Suzy Bouchard M.D.   On: 06/28/2022 08:10    Microbiology No results found for this or any previous visit (from the past 240 hour(s)).  Lab Basic Metabolic Panel: No results for input(s): "NA", "K", "CL", "CO2", "GLUCOSE", "BUN", "CREATININE", "CALCIUM", "MG", "PHOS" in the last 168 hours. Liver Function Tests: No results for input(s): "AST", "ALT", "ALKPHOS", "BILITOT", "PROT", "ALBUMIN" in the last 168 hours. No results for input(s): "LIPASE", "AMYLASE" in the last 168 hours. No results for input(s): "AMMONIA" in the last 168 hours. CBC: No results for input(s): "WBC", "NEUTROABS", "HGB", "HCT", "MCV", "PLT" in the last 168 hours. Cardiac Enzymes: No results for input(s): "CKTOTAL", "CKMB", "CKMBINDEX", "TROPONINI" in the last 168 hours. Sepsis Labs: No results for input(s): "PROCALCITON", "WBC", "LATICACIDVEN" in the last 168 hours.  Procedures/Operations  See Epic   Maryjane Hurter 07/28/2022, 6:09 AM

## 2022-08-07 NOTE — Progress Notes (Signed)
Nutrition Brief Note  Chart reviewed. Pt now transitioning to comfort care.  No further nutrition interventions planned at this time.  Please re-consult as needed.     Loran Auguste RD, LDN For contact information, refer to [facility]iON.   

## 2022-08-07 DEATH — deceased

## 2022-08-08 ENCOUNTER — Telehealth: Payer: Self-pay | Admitting: Hematology

## 2022-08-08 NOTE — Telephone Encounter (Signed)
I called pt's wife Cameron Rice to discuss some test results which came back after his death. I expressed my condolence to her. I reviewed his FO and cancer ID findings, which supports primary gall bladder primary. His initial CT did show enlarged gall bladder although no mass was seen. FO reviewed some common mutations in caNcer, such as KRAS, PTEN, TP53, but nothing targetable easily. Cameron Rice has no more access to his Mychart after he deceased. I will send her a hard copy of these test reports. She appreciated my call.   Truitt Merle MD 08/08/2022

## 2022-08-10 LAB — FUNGUS CULTURE WITH STAIN

## 2022-08-10 LAB — FUNGUS CULTURE RESULT

## 2022-08-10 LAB — FUNGAL ORGANISM REFLEX

## 2022-08-16 ENCOUNTER — Telehealth: Payer: Self-pay

## 2022-08-16 NOTE — Telephone Encounter (Signed)
Mailed out the below mentioned information to patients wife.   Truitt Merle, MD sent to Elayne Guerin, [place]; Evalee Jefferson, RN Latavious Bitter,  Could you print out his cancer ID report and first 5 pages of [place], and mail to his wife? He died in hospital last month. Let me take a look before you send it out  Thanks  Krista Blue

## 2022-08-16 NOTE — Telephone Encounter (Signed)
Opened in error

## 2023-11-09 IMAGING — MR MR LUMBAR SPINE W/O CM
4 of 5 series · 26 of 48 positions shown · non-contrast
Comparison: Lumbar spine radiographs 10/10/2021

CLINICAL DATA: Chronic low back and bilateral buttock pain. Right
greater than left leg pain.

EXAM:
MRI LUMBAR SPINE WITHOUT CONTRAST
TECHNIQUE: Multiplanar, multisequence MR imaging of the lumbar spine was
performed. No intravenous contrast was administered.

[Series 2: T2 · sagittal · 4.0mm · 1.09mm/px · 6 of 17 slices shown (1 of 2)]
[im 1/17]
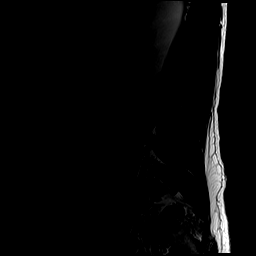
[im 4/17]
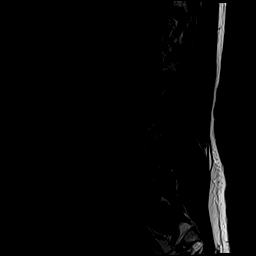
[im 7/17]
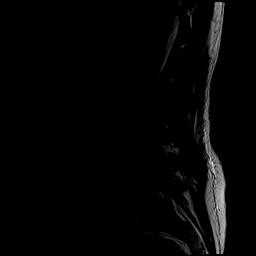
[im 10/17]
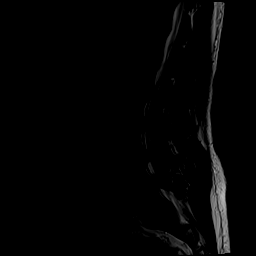
[im 13/17]
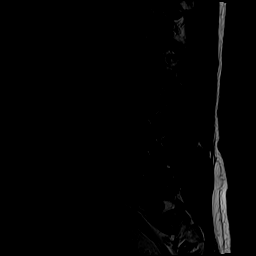
[im 17/17]
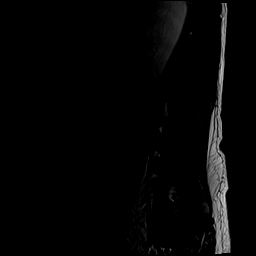

[Series 4: T1 · sagittal · 4.0mm · 1.09mm/px · 6 of 17 slices shown (1 of 2)]
[im 1/17]
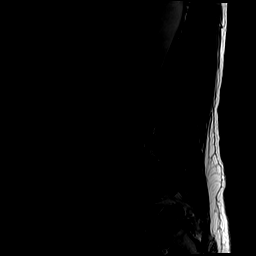
[im 4/17]
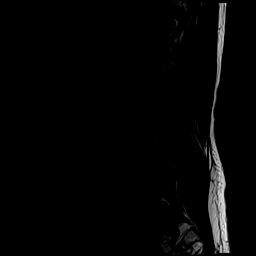
[im 7/17]
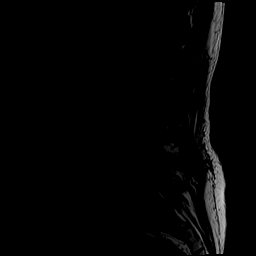
[im 10/17]
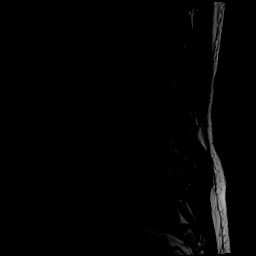
[im 13/17]
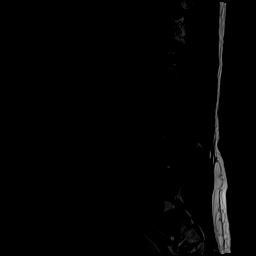
[im 17/17]
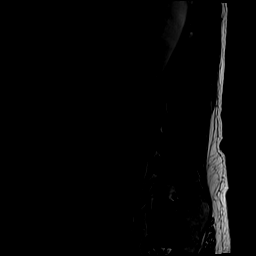

[Series 5: T2 · axial · 4.0mm · 0.39mm/px · z∈[-143,+97]mm · 9 of 44 slices shown (2 of 2)]
[im 1/44]
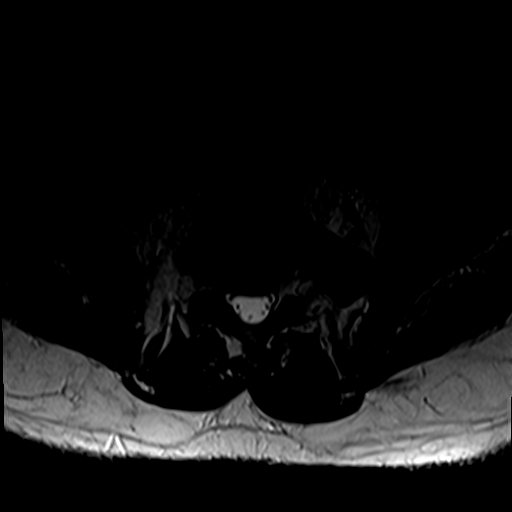
[im 7/44]
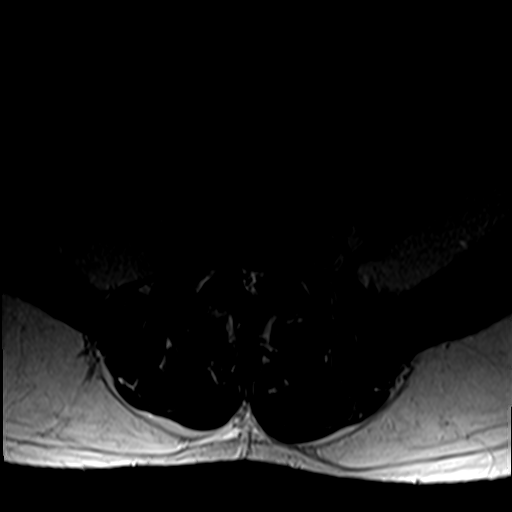
[im 13/44]
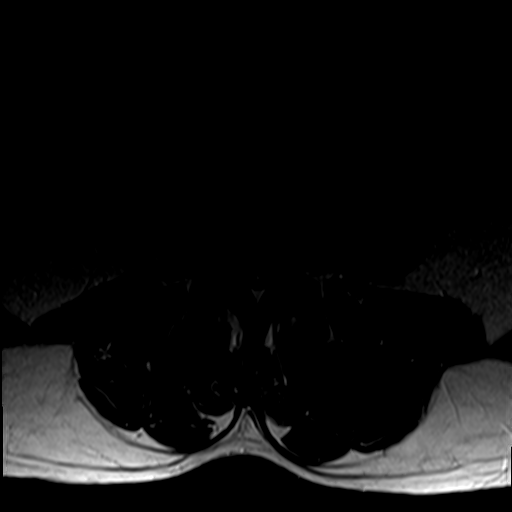
[im 19/44]
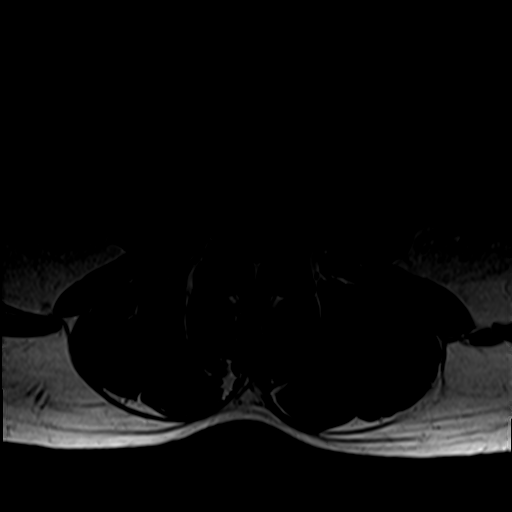
[im 22/44]
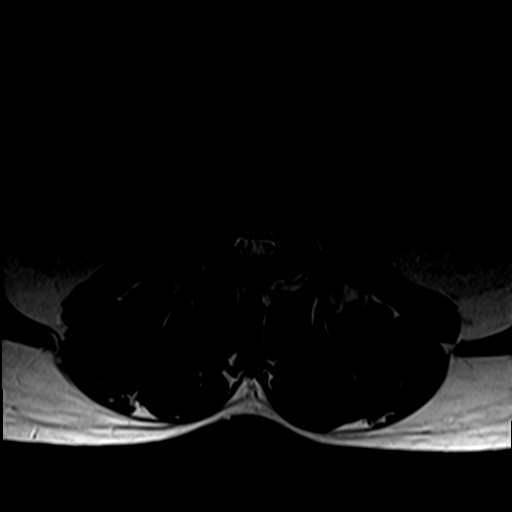
[im 25/44]
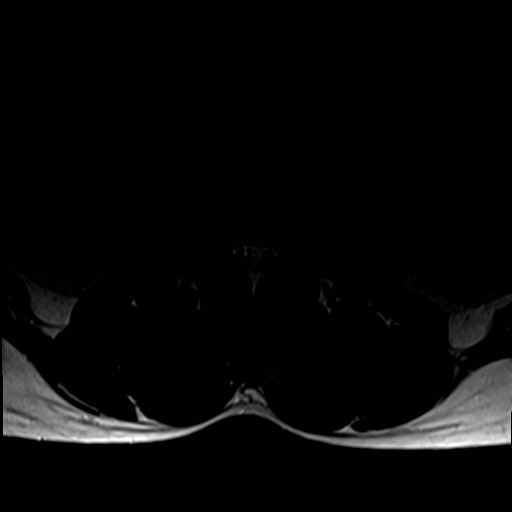
[im 31/44]
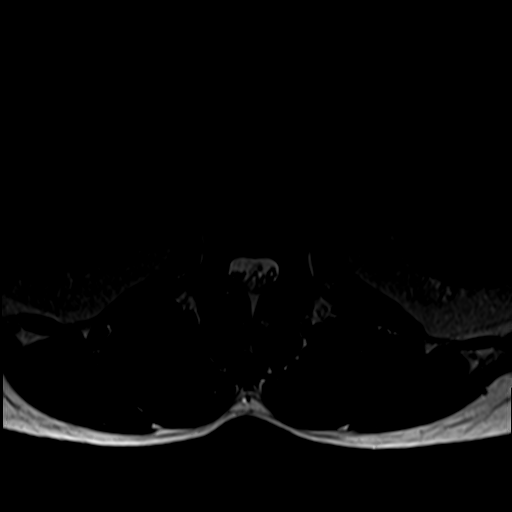
[im 37/44]
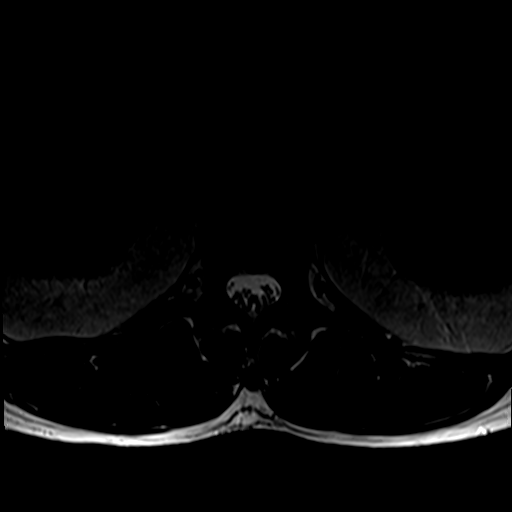
[im 44/44]
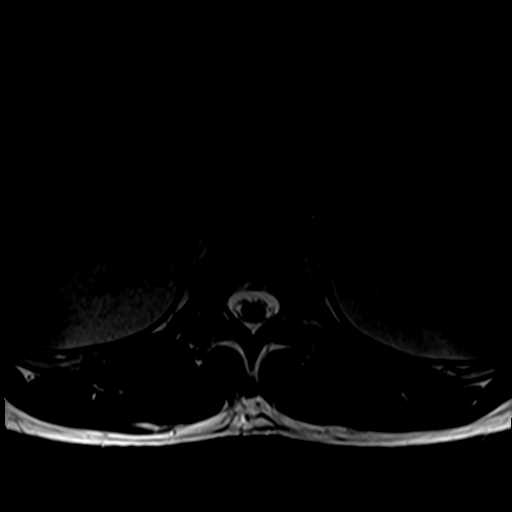

[Series 6: T1 · axial · 4.0mm · 0.39mm/px · z∈[-143,+63]mm · 5 of 44 slices shown (2 of 2)]
[im 1/44]
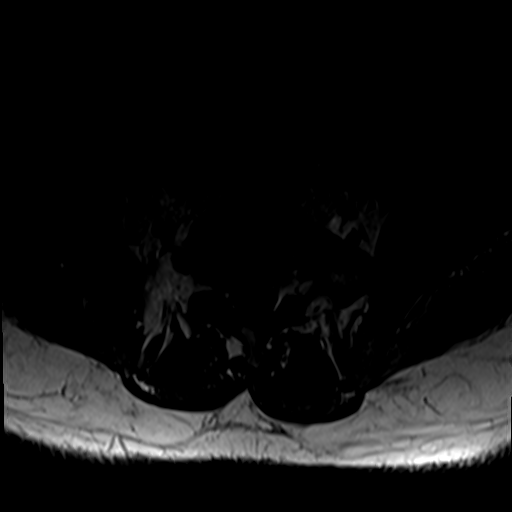
[im 7/44]
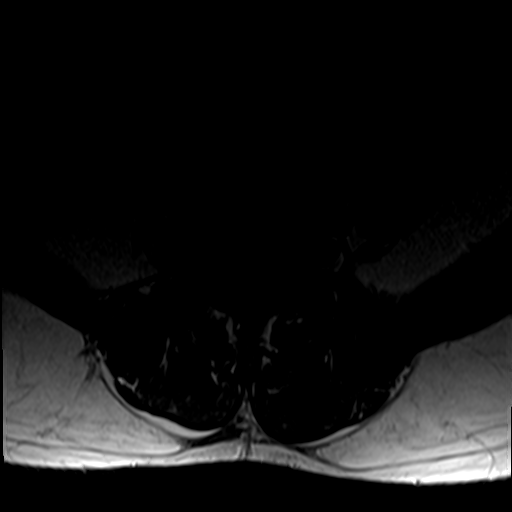
[im 13/44]
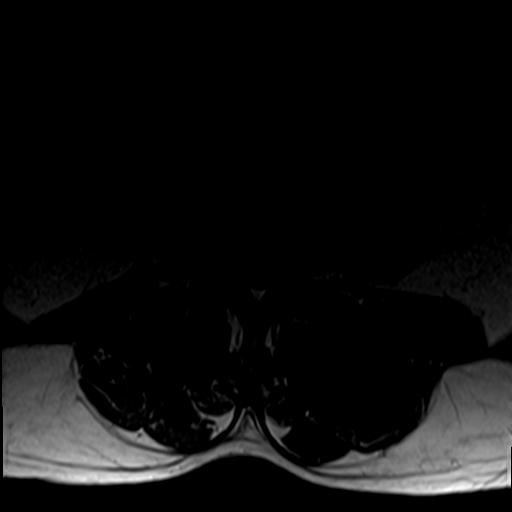
[im 22/44]
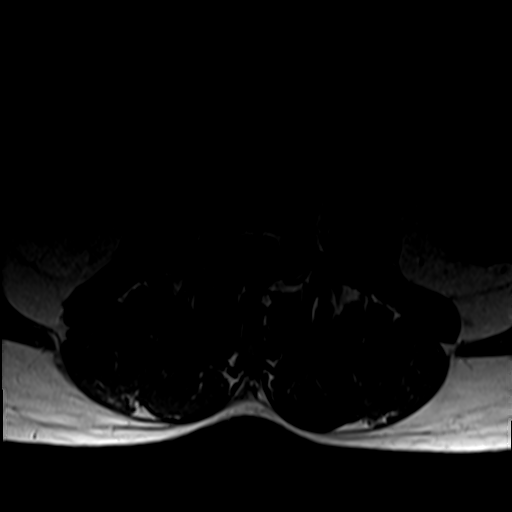
[im 37/44]
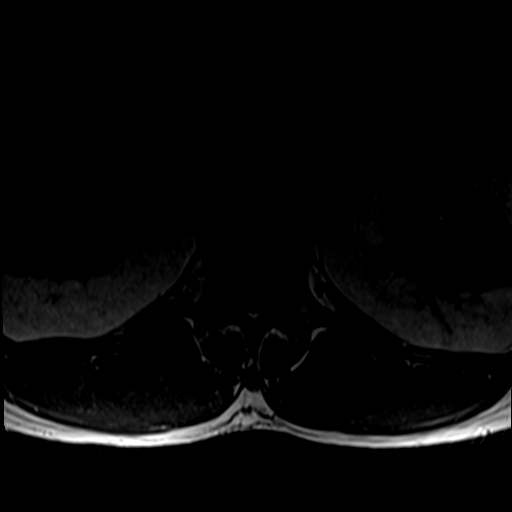

[26 of 48 positions shown; findings below may reference images not displayed]

FINDINGS: Segmentation: Transitional lumbosacral anatomy with partial
lumbarization of S1. Rudimentary S1-2 disc.

Alignment:  Normal.

Vertebrae: No fracture, suspicious marrow lesion, or significant
marrow edema.

Conus medullaris and cauda equina: Conus extends to the L1 level and
is normal in signal. The cauda equina nerve roots are
redundant/tortuous in appearance related to high-grade spinal
stenosis.

Paraspinal and other soft tissues: 1.4 cm T2 hyperintensity in the
left kidney, likely a cyst and for which no follow-up imaging is
recommended.

Disc levels:

Disc desiccation in the lower lumbar spine with moderate disc space
narrowing at L4-5 and mild narrowing at L5-S1. Diffuse congenital
narrowing of the lumbar spinal canal due to short pedicles.

T12-L1 and L1-2: Negative.

L2-3: Mild disc bulging and mild facet hypertrophy result in mild
spinal stenosis, mild bilateral lateral recess stenosis, and
borderline bilateral neural foraminal stenosis.

L3-4: Disc bulging and moderate facet and ligamentum flavum
hypertrophy result in moderate to severe spinal stenosis and
moderate bilateral neural foraminal stenosis. Potential bilateral L4
nerve root impingement.

L4-5: Disc bulging and severe facet and ligamentum flavum
hypertrophy result in severe spinal stenosis and moderate bilateral
neural foraminal stenosis. Potential impingement of multiple nerve
roots bilaterally at this level, particularly the L5 roots.

L5-S1: Disc bulging and severe facet and ligamentum flavum
hypertrophy result in moderate to severe spinal stenosis and severe
bilateral neural foraminal stenosis with potential bilateral L5 and
S1 nerve root impingement. Bilateral facet joint effusions.
IMPRESSION: 1. Congenitally short pedicles with superimposed disc and facet
degeneration resulting in severe spinal stenosis at L4-5 and
moderate to severe spinal stenosis at L3-4 and L5-S1.
2. Severe bilateral neural foraminal stenosis at L5-S1.
3. Moderate bilateral neural foraminal stenosis at L3-4 and L4-5.
